# Patient Record
Sex: Female | Born: 1945 | Race: White | Hispanic: No | Smoking: Former smoker
Health system: Southern US, Community
[De-identification: ages and names within clinical notes are randomized; demographics above are authoritative.]

## PROBLEM LIST (undated history)

## (undated) DIAGNOSIS — D126 Benign neoplasm of colon, unspecified: Secondary | ICD-10-CM

## (undated) DIAGNOSIS — C169 Malignant neoplasm of stomach, unspecified: Secondary | ICD-10-CM

## (undated) DIAGNOSIS — J189 Pneumonia, unspecified organism: Secondary | ICD-10-CM

## (undated) DIAGNOSIS — E039 Hypothyroidism, unspecified: Secondary | ICD-10-CM

## (undated) DIAGNOSIS — C801 Malignant (primary) neoplasm, unspecified: Secondary | ICD-10-CM

## (undated) DIAGNOSIS — K648 Other hemorrhoids: Secondary | ICD-10-CM

## (undated) DIAGNOSIS — Z800 Family history of malignant neoplasm of digestive organs: Secondary | ICD-10-CM

## (undated) DIAGNOSIS — C189 Malignant neoplasm of colon, unspecified: Secondary | ICD-10-CM

## (undated) DIAGNOSIS — I499 Cardiac arrhythmia, unspecified: Secondary | ICD-10-CM

## (undated) DIAGNOSIS — F419 Anxiety disorder, unspecified: Secondary | ICD-10-CM

## (undated) DIAGNOSIS — Z803 Family history of malignant neoplasm of breast: Secondary | ICD-10-CM

## (undated) DIAGNOSIS — R1033 Periumbilical pain: Secondary | ICD-10-CM

## (undated) DIAGNOSIS — R109 Unspecified abdominal pain: Secondary | ICD-10-CM

## (undated) DIAGNOSIS — Z808 Family history of malignant neoplasm of other organs or systems: Secondary | ICD-10-CM

## (undated) DIAGNOSIS — D649 Anemia, unspecified: Secondary | ICD-10-CM

## (undated) DIAGNOSIS — Z8669 Personal history of other diseases of the nervous system and sense organs: Secondary | ICD-10-CM

## (undated) HISTORY — DX: Malignant (primary) neoplasm, unspecified: C80.1

## (undated) HISTORY — PX: BREAST SURGERY: SHX581

## (undated) HISTORY — DX: Personal history of other diseases of the nervous system and sense organs: Z86.69

## (undated) HISTORY — DX: Anemia, unspecified: D64.9

## (undated) HISTORY — DX: Benign neoplasm of colon, unspecified: D12.6

## (undated) HISTORY — DX: Other hemorrhoids: K64.8

## (undated) HISTORY — DX: Malignant neoplasm of stomach, unspecified: C16.9

## (undated) HISTORY — DX: Malignant neoplasm of colon, unspecified: C18.9

## (undated) HISTORY — DX: Family history of malignant neoplasm of digestive organs: Z80.0

## (undated) HISTORY — DX: Unspecified abdominal pain: R10.9

## (undated) HISTORY — PX: COLONOSCOPY: SHX174

## (undated) HISTORY — DX: Family history of malignant neoplasm of other organs or systems: Z80.8

## (undated) HISTORY — DX: Periumbilical pain: R10.33

## (undated) HISTORY — PX: BREAST BIOPSY: SHX20

## (undated) HISTORY — DX: Hypothyroidism, unspecified: E03.9

## (undated) HISTORY — DX: Anxiety disorder, unspecified: F41.9

## (undated) HISTORY — PX: TONSILLECTOMY: SUR1361

## (undated) HISTORY — DX: Family history of malignant neoplasm of breast: Z80.3

---

## 1998-01-19 ENCOUNTER — Other Ambulatory Visit: Admission: RE | Admit: 1998-01-19 | Discharge: 1998-01-19 | Payer: Self-pay | Admitting: *Deleted

## 1998-08-24 ENCOUNTER — Other Ambulatory Visit: Admission: RE | Admit: 1998-08-24 | Discharge: 1998-08-24 | Payer: Self-pay | Admitting: *Deleted

## 1999-02-15 ENCOUNTER — Other Ambulatory Visit: Admission: RE | Admit: 1999-02-15 | Discharge: 1999-02-15 | Payer: Self-pay | Admitting: Obstetrics and Gynecology

## 1999-07-22 ENCOUNTER — Encounter: Payer: Self-pay | Admitting: *Deleted

## 1999-07-22 ENCOUNTER — Encounter: Admission: RE | Admit: 1999-07-22 | Discharge: 1999-07-22 | Payer: Self-pay | Admitting: *Deleted

## 1999-08-24 ENCOUNTER — Other Ambulatory Visit: Admission: RE | Admit: 1999-08-24 | Discharge: 1999-08-24 | Payer: Self-pay | Admitting: *Deleted

## 2000-02-16 ENCOUNTER — Other Ambulatory Visit: Admission: RE | Admit: 2000-02-16 | Discharge: 2000-02-16 | Payer: Self-pay | Admitting: *Deleted

## 2000-08-13 ENCOUNTER — Encounter (INDEPENDENT_AMBULATORY_CARE_PROVIDER_SITE_OTHER): Payer: Self-pay | Admitting: Specialist

## 2000-08-13 ENCOUNTER — Ambulatory Visit (HOSPITAL_COMMUNITY): Admission: RE | Admit: 2000-08-13 | Discharge: 2000-08-13 | Payer: Self-pay | Admitting: Gastroenterology

## 2001-02-18 ENCOUNTER — Other Ambulatory Visit: Admission: RE | Admit: 2001-02-18 | Discharge: 2001-02-18 | Payer: Self-pay | Admitting: *Deleted

## 2001-04-02 ENCOUNTER — Emergency Department (HOSPITAL_COMMUNITY): Admission: EM | Admit: 2001-04-02 | Discharge: 2001-04-02 | Payer: Self-pay | Admitting: Emergency Medicine

## 2001-10-29 ENCOUNTER — Encounter: Admission: RE | Admit: 2001-10-29 | Discharge: 2001-10-29 | Payer: Self-pay | Admitting: Obstetrics and Gynecology

## 2001-10-29 ENCOUNTER — Encounter: Payer: Self-pay | Admitting: Obstetrics and Gynecology

## 2001-12-19 ENCOUNTER — Encounter: Payer: Self-pay | Admitting: Obstetrics and Gynecology

## 2001-12-19 ENCOUNTER — Encounter: Admission: RE | Admit: 2001-12-19 | Discharge: 2001-12-19 | Payer: Self-pay | Admitting: Obstetrics and Gynecology

## 2002-02-03 ENCOUNTER — Other Ambulatory Visit: Admission: RE | Admit: 2002-02-03 | Discharge: 2002-02-03 | Payer: Self-pay | Admitting: Obstetrics and Gynecology

## 2002-07-31 ENCOUNTER — Encounter: Admission: RE | Admit: 2002-07-31 | Discharge: 2002-07-31 | Payer: Self-pay | Admitting: Obstetrics and Gynecology

## 2002-07-31 ENCOUNTER — Encounter: Payer: Self-pay | Admitting: Obstetrics and Gynecology

## 2003-04-06 ENCOUNTER — Other Ambulatory Visit: Admission: RE | Admit: 2003-04-06 | Discharge: 2003-04-06 | Payer: Self-pay | Admitting: Obstetrics and Gynecology

## 2003-08-26 ENCOUNTER — Encounter: Admission: RE | Admit: 2003-08-26 | Discharge: 2003-08-26 | Payer: Self-pay | Admitting: Obstetrics and Gynecology

## 2003-08-30 ENCOUNTER — Ambulatory Visit (HOSPITAL_COMMUNITY): Admission: RE | Admit: 2003-08-30 | Discharge: 2003-08-30 | Payer: Self-pay | Admitting: Endocrinology

## 2004-09-09 ENCOUNTER — Encounter: Admission: RE | Admit: 2004-09-09 | Discharge: 2004-09-09 | Payer: Self-pay | Admitting: Obstetrics and Gynecology

## 2005-08-14 ENCOUNTER — Ambulatory Visit (HOSPITAL_COMMUNITY): Admission: RE | Admit: 2005-08-14 | Discharge: 2005-08-14 | Payer: Self-pay | Admitting: Gastroenterology

## 2005-08-14 ENCOUNTER — Encounter (INDEPENDENT_AMBULATORY_CARE_PROVIDER_SITE_OTHER): Payer: Self-pay | Admitting: *Deleted

## 2005-09-19 ENCOUNTER — Encounter: Admission: RE | Admit: 2005-09-19 | Discharge: 2005-09-19 | Payer: Self-pay | Admitting: Obstetrics and Gynecology

## 2006-09-21 ENCOUNTER — Encounter: Admission: RE | Admit: 2006-09-21 | Discharge: 2006-09-21 | Payer: Self-pay | Admitting: Obstetrics and Gynecology

## 2007-05-27 ENCOUNTER — Emergency Department (HOSPITAL_COMMUNITY): Admission: EM | Admit: 2007-05-27 | Discharge: 2007-05-27 | Payer: Self-pay | Admitting: Emergency Medicine

## 2007-10-07 ENCOUNTER — Encounter: Admission: RE | Admit: 2007-10-07 | Discharge: 2007-10-07 | Payer: Self-pay | Admitting: Obstetrics and Gynecology

## 2007-10-09 ENCOUNTER — Encounter: Admission: RE | Admit: 2007-10-09 | Discharge: 2007-10-09 | Payer: Self-pay | Admitting: Obstetrics and Gynecology

## 2008-03-26 ENCOUNTER — Encounter: Admission: RE | Admit: 2008-03-26 | Discharge: 2008-03-26 | Payer: Self-pay | Admitting: Obstetrics and Gynecology

## 2008-07-02 ENCOUNTER — Ambulatory Visit (HOSPITAL_BASED_OUTPATIENT_CLINIC_OR_DEPARTMENT_OTHER): Admission: RE | Admit: 2008-07-02 | Discharge: 2008-07-02 | Payer: Self-pay | Admitting: Surgery

## 2008-07-02 ENCOUNTER — Encounter (INDEPENDENT_AMBULATORY_CARE_PROVIDER_SITE_OTHER): Payer: Self-pay | Admitting: Surgery

## 2008-12-07 ENCOUNTER — Encounter: Admission: RE | Admit: 2008-12-07 | Discharge: 2008-12-07 | Payer: Self-pay | Admitting: Obstetrics and Gynecology

## 2008-12-15 ENCOUNTER — Encounter: Admission: RE | Admit: 2008-12-15 | Discharge: 2008-12-15 | Payer: Self-pay | Admitting: Obstetrics and Gynecology

## 2009-12-28 ENCOUNTER — Encounter: Admission: RE | Admit: 2009-12-28 | Discharge: 2009-12-28 | Payer: Self-pay | Admitting: Obstetrics and Gynecology

## 2010-02-19 ENCOUNTER — Emergency Department (HOSPITAL_COMMUNITY): Admission: EM | Admit: 2010-02-19 | Discharge: 2010-02-19 | Payer: Self-pay | Admitting: Family Medicine

## 2010-07-17 ENCOUNTER — Encounter: Payer: Self-pay | Admitting: Obstetrics and Gynecology

## 2010-07-18 ENCOUNTER — Encounter: Payer: Self-pay | Admitting: Obstetrics and Gynecology

## 2010-11-08 NOTE — Op Note (Signed)
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   POSTOPERATIVE DIAGNOSIS:  Mass, right breast with atypical hyperplasia  on needle biopsy.   OPERATION:  Needle-guided excision, right breast mass.   SURGEON:  Currie Paris, MD   ANESTHESIA:  General.   CLINICAL HISTORY:  This patient had a biopsy with an abnormality seen on  mammography that showed some atypia.  Wide excisional biopsy was  recommended.   DESCRIPTION OF PROCEDURE:  The patient was seen in the holding area, and  she had no further questions.  She was taken to the operating room and  after satisfactory general anesthesia had been obtained, the right  breast was prepped and draped.  The time-out was done.   I had previously reviewed the guidewire localization films and 2  guidewires were placed, one not quite as deep as the lesion and the  second, the tip went down to the clip.  They both were almost in  identical tracts.   I put some local 0.25% plain Marcaine and then made a transverse  incision and divided some of the subcutaneous tissue for a distance  about a centimeter.  I then grasped the tissue around the guidewires and  took an excisional biopsy in a somewhat cylindrical fashion around the  guidewires until I got beyond the tips at which point I was at the edge  of the pectoralis muscle.  The specimen completely surrounded both  guidewires and I was beyond the tips, so I thought that the area was  encompassed within the lumpectomy.  There was no other palpable  abnormality near the edges of the biopsy cavity.   We did a specimen mammogram which showed the clip and the guidewires in  the specimen.   The wound was irrigated and further local placed.  I then closed in  layers with 3-0 Vicryl and 4-0 Monocryl subcuticular plus Dermabond.   The patient tolerated the procedure well, and there were no  complications.      Currie Paris, M.D.  Electronically Signed     CJS/MEDQ  D:  07/02/2008  T:  07/03/2008  Job:  [id number]   cc:   Jeralyn Ruths, MD  Sherian Rein, MD

## 2010-11-11 NOTE — Op Note (Signed)
Leslie Rivera, ROSSA              ACCOUNT NO.:  [id number]   MEDICAL RECORD NO.:  [id number]          PATIENT TYPE:  AMB   LOCATION:  ENDO                         FACILITY:  [facility]   PHYSICIAN:  Anselmo Rod, M.D.  DATE OF BIRTH:  [birth year]   DATE OF PROCEDURE:  08/14/2005  DATE OF DISCHARGE:                                 OPERATIVE REPORT   PROCEDURE:  Colonoscopy with snare polypectomy x10.   ENDOSCOPIST:  Anselmo Rod, M.D.   INSTRUMENT USED:  Olympus videocolonoscope.   INDICATIONS FOR PROCEDURE:  The patient is a 65 year old white female with a  personal history of colonic polyps undergoing a repeat colonoscopy to rule  out polyps.   PREPROCEDURE PREPARATION:  Informed consent was procured from the patient.  The patient was fasted for four hours prior to the procedure and prepped  with OsmoPrep pills the night of and the morning of the procedure.  The  risks and benefits of the procedure, including a 10% missed rate of cancer  and polyps, was discussed with her as well.   PREPROCEDURE PHYSICAL EXAMINATION:  VITAL SIGNS:  Stable.  NECK:  Supple.  CHEST:  Clear to auscultation. S1, S2 regular.  ABDOMEN:  Soft with normal bowel sounds.   DESCRIPTION OF PROCEDURE:  The patient was placed in the left lateral  decubitus position and sedated with 100 mcg of Fentanyl and 10 mg of Versed  in slow incremental doses.  Once the patient was adequately sedated and  maintained on low-flow oxygen and continuous cardiac monitoring, the Olympus  videocolonoscope was advanced from the rectum to the cecum.  The appendiceal  orifice and ileocecal valve were clearly visualized and photographed.  A  small cecal polyp was removed via hot snare from the cecal base.  Another  small sessile polyp was removed from the proximal right colon via hot snare  as well.  There was some bleeding from the base of the polypectomy site, and  therefore, 5 cc of epinephrine were injected into this area  to achieve  hemostasis.  Four other polyps were removed by hot snare from the right  colon as well.  A total of six polyps were snared from the right colon, four  of them were snared from the left colon.  Retroflexion in the rectum  revealed small internal hemorrhoids.  Two small sessile polyps were ablated  by the tip of the scope in the distal right colon as well.  There was no  evidence of diverticulosis.  The patient tolerated the procedure well  without complications.   IMPRESSION:  1.Multiple colonic polyps removed by hot snare.  2.Small polyp snared from proximal right colon, with some bleeding at the  base of the polypectomy site.  5 cc of epinephrine injected to achieve  hemostasis.  3.Small internal hemorrhoids.   RECOMMENDATIONS:  1.Await pathology results.  2.Avoid all nonsteroidals, including aspirin, for the next four weeks.  3.Repeat colonoscopy depending on pathology results.  4.Outpatient followup as need arises in the future.      Anselmo Rod, M.D.  Electronically Signed  JNM/MEDQ  D:  08/14/2005  T:  08/14/2005  Job:  [id number]   cc:   Vangie Bicker, Felicie Morn, M.D.  Fax: [phone]

## 2010-11-11 NOTE — Procedures (Signed)
[facility]  Patient:    Leslie Rivera, Leslie Rivera                     MRN: [id number] Proc. Date: 08/13/00 Adm. Date:  [id number] Attending:  Charna Elizabeth CC:         Nolon Nations, M.D., M.P.H.   Procedure Report  DATE OF BIRTH:  March 22, 1946  REFERRING PHYSICIAN:  Nolon Nations, M.D., M.P.H.  PROCEDURE PERFORMED:  Colonoscopy with snare polypectomy x 1.  ENDOSCOPIST:  Anselmo Rod, M.D.  INSTRUMENT USED:  Olympus video colonoscope.  INDICATIONS FOR PROCEDURE:  Abnormal rectal exam with questionable mucosal ____________ versus a polyps felt on digital examination.  With a family history of colon cancer in a maternal grandmother in a 4 year old white female, rule out colonic polyps, masses, hemorrhoids, etc.  PREPROCEDURE PREPARATION:  Informed consent was procured from the patient. The patient was fasted for eight hours prior to the procedure and prepped with a bottle of magnesium citrate and a gallon of NuLytely the night prior to the procedure.  PREPROCEDURE PHYSICAL:  The patient had stable vital signs.  Neck supple. Chest clear to auscultation.  S1, S2 regular.  No murmur, rub or gallop.  No rales, rhonchi or wheezing.  Abdomen soft with normal abdominal bowel sounds.  DESCRIPTION OF PROCEDURE:  The patient was placed in the left lateral decubitus position and sedated with 100 mg of Demerol and 10 mg of Versed intravenously.  Once the patient was adequately sedated and maintained on low-flow oxygen and continuous cardiac monitoring, the Olympus video colonoscope was advanced from the rectum to the cecum without difficulty. There was no evidence of diverticular disease.  There was a small sessile polyp seen in the midright colon that was removed by snare polypectomy.  The procedure was complete up to the cecum.  The appendicular orifice and ileocecal valve were clearly visualized and photographed.  The rest of the colonic  mucosa appeared healthy.  There were small nonbleeding internal and external hemorrhoids seen on retroflexion and anal inspection.  IMPRESSION: 1. Small polyp snared from midright colon. 2. Small nonbleeding internal and external hemorrhoids, no rectal masses    appreciated.  RECOMMENDATIONS: 1. Await pathology results. 2. Outpatient follow-up in the next two weeks.DD:  08/13/00 TD:  08/13/00 Job: [id number]

## 2010-12-02 ENCOUNTER — Other Ambulatory Visit: Payer: Self-pay | Admitting: Obstetrics & Gynecology

## 2010-12-02 DIAGNOSIS — Z1231 Encounter for screening mammogram for malignant neoplasm of breast: Secondary | ICD-10-CM

## 2010-12-29 ENCOUNTER — Other Ambulatory Visit: Payer: Self-pay | Admitting: Obstetrics & Gynecology

## 2010-12-30 ENCOUNTER — Ambulatory Visit
Admission: RE | Admit: 2010-12-30 | Discharge: 2010-12-30 | Disposition: A | Discharge status: Home / Self Care | Payer: BC Managed Care – PPO | Source: Ambulatory Visit | Attending: Obstetrics & Gynecology | Admitting: Obstetrics & Gynecology

## 2010-12-30 DIAGNOSIS — Z1231 Encounter for screening mammogram for malignant neoplasm of breast: Secondary | ICD-10-CM

## 2011-08-20 ENCOUNTER — Encounter (HOSPITAL_COMMUNITY): Payer: Self-pay | Admitting: Adult Health

## 2011-08-20 ENCOUNTER — Emergency Department (HOSPITAL_COMMUNITY): Payer: BC Managed Care – PPO

## 2011-08-20 ENCOUNTER — Emergency Department (HOSPITAL_COMMUNITY)
Admission: EM | Admit: 2011-08-20 | Discharge: 2011-08-20 | Disposition: A | Discharge status: Home / Self Care | Payer: BC Managed Care – PPO | Attending: Emergency Medicine | Admitting: Emergency Medicine

## 2011-08-20 DIAGNOSIS — W11XXXA Fall on and from ladder, initial encounter: Secondary | ICD-10-CM | POA: Insufficient documentation

## 2011-08-20 DIAGNOSIS — S2249XA Multiple fractures of ribs, unspecified side, initial encounter for closed fracture: Secondary | ICD-10-CM

## 2011-08-20 DIAGNOSIS — S2239XA Fracture of one rib, unspecified side, initial encounter for closed fracture: Secondary | ICD-10-CM | POA: Insufficient documentation

## 2011-08-20 DIAGNOSIS — M549 Dorsalgia, unspecified: Secondary | ICD-10-CM | POA: Insufficient documentation

## 2011-08-20 DIAGNOSIS — R079 Chest pain, unspecified: Secondary | ICD-10-CM | POA: Insufficient documentation

## 2011-08-20 HISTORY — DX: Cardiac arrhythmia, unspecified: I49.9

## 2011-08-20 MED ORDER — LORAZEPAM 2 MG/ML IJ SOLN
1.0000 mg | Freq: Once | INTRAMUSCULAR | Status: AC
  Administered 2011-08-20: 1 mg via INTRAVENOUS
  Filled 2011-08-20: qty 1

## 2011-08-20 MED ORDER — HYDROMORPHONE HCL PF 1 MG/ML IJ SOLN
1.0000 mg | Freq: Once | INTRAMUSCULAR | Status: AC
  Administered 2011-08-20: 1 mg via INTRAVENOUS
  Filled 2011-08-20: qty 1

## 2011-08-20 MED ORDER — DIAZEPAM 5 MG PO TABS: 5.0000 mg | ORAL_TABLET | Freq: Two times a day (BID) | ORAL | Status: AC | PRN

## 2011-08-20 MED ORDER — OXYCODONE-ACETAMINOPHEN 7.5-325 MG PO TABS: 1.0000 | ORAL_TABLET | Freq: Four times a day (QID) | ORAL | Status: AC | PRN

## 2011-08-20 MED ORDER — SODIUM CHLORIDE 0.9 % IV BOLUS (SEPSIS)
500.0000 mL | Freq: Once | INTRAVENOUS | Status: AC
  Administered 2011-08-20: 500 mL via INTRAVENOUS

## 2011-08-20 NOTE — ED Provider Notes (Signed)
History     CSN: [id number]  Arrival date & time 08/20/11  1147   First MD Initiated Contact with Patient 08/20/11 1150      Chief Complaint  Patient presents with  . Back Pain   This is a very pleasant, 66 year old female. 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 Patient apparently fell off a ladder at her place of business on Wednesday, and when she fell from a height of approximately 5 feet. She did land on her back and complains of pain in the left posterior rib area. When she fell she did not lose consciousness. She had no neck pain. No abdominal pain. No anterior chest pain. She was seen by her primary care physician. Both Wednesday, and Friday. She was initially prescribed Robaxin and then was prescribed oxycodone on Friday. She states that she is feeling better, but continues to have "spasms" in her posterior rib area. She has had no fever. No cough. No difficulty breathing. Her family states that she is otherwise a very healthy person.  No head injury.  (Consider location/radiation/quality/duration/timing/severity/associated sxs/prior treatment) HPI  Past Medical History  Diagnosis Date  . Irregular heart beat     No past surgical history on file.  No family history on file.  History  Substance Use Topics  . Smoking status: Never Smoker   . Smokeless tobacco: Not on file  . Alcohol Use: No    OB History    Grav Para Term Preterm Abortions TAB SAB Ect Mult Living                  Review of Systems  All other systems reviewed and are negative.    Allergies  Review of patient's allergies indicates no known allergies.  Home Medications  No current outpatient prescriptions on file.  BP 125/74  Pulse 50  Temp 97.6 F (36.4 C)  Resp 16  SpO2 96%  Physical Exam  Nursing note and vitals reviewed. Constitutional: She is oriented to person, place, and time. She appears well-developed and well-nourished.  HENT:  Head: Normocephalic and atraumatic.  Eyes:  Conjunctivae and EOM are normal. Pupils are equal, round, and reactive to light.  Neck: Neck supple.  Cardiovascular: Normal rate and regular rhythm.  Exam reveals no gallop and no friction rub.   No murmur heard. Pulmonary/Chest: Effort normal and breath sounds normal. No respiratory distress. She has no wheezes. She has no rales. She exhibits no tenderness.       Diffuse tenderness L post rib area.  No ecchymosis,  No swelling or redness, no crepitance.  No bony deformity.   Abdominal: Soft. Bowel sounds are normal. She exhibits no distension. There is no tenderness. There is no rebound and no guarding.  Musculoskeletal: Normal range of motion.       Findings of the spine is examined, there is no spinal tenderness. No swelling no redness no ecchymoses  Neurological: She is alert and oriented to person, place, and time. No cranial nerve deficit. Coordination normal.  Skin: Skin is warm and dry. No rash noted.  Psychiatric: She has a normal mood and affect.    ED Course  Procedures (including critical care time)  Labs Reviewed - No data to display No results found.   No diagnosis found.    MDM  Pt is seen and examined;  Initial history and physical completed.  Will follow.      Results for orders placed during the hospital encounter of 07/02/08  POCT HEMOGLOBIN-HEMACUE      Component Value Range   Hemoglobin 13.5  12.0 - 15.0 (g/dL)   Ct Chest Wo Contrast  08/20/2011  *RADIOLOGY REPORT*  Clinical Data: Fall  CT CHEST WITHOUT CONTRAST  Technique:  Multidetector CT imaging of the chest was performed following the standard protocol without IV contrast.  Comparison: None.  Findings: No obvious evidence of mediastinal hemorrhage.  Lack of intravenous contrast severely limits examination of the mediastinum.  Aortic injury cannot be excluded.  No pneumothorax.  Calcified granulomata in the right upper lobe.  Linear atelectasis at both lung bases  Small left pleural effusion.  Acute  minimally displaced fractures of the posterolateral left seventh and eighth ribs is present.   Thoracic spine and sternum are intact.  IMPRESSION: Acute minimally displaced left seventh and eighth rib fractures.  Small left pleural effusion.  Intact thoracic spine.  Original Report Authenticated By: Donavan Burnet, M.D.     CT scan is reviewed with patient showing minimally displaced left seventh and eighth rib fractures and a small pleural effusion. The thoracic spine was intact.   Patient was feeling much better from IV pain medications and is given one more shot of Dilaudid prior to disposition   She states her current pain medications are actually not working, so will be prescribed Percocet 7.5 mg. She is also given a dose of Valium for her severe muscle spasms.   Patient was given an incentive spirometer with instructions to use and to use with specific instructions including use, at least 5-10 times per hour. She is also instructed to see her primary physician within the next 48 hours for reassessment. She was told to return to the ED for any concerns such as worsening pain, difficulty breathing, dizziness, syncope, etc.   Otherwise, patient is stable for discharge, and wants to go home. Discharged home in stable and improved condition     Dillan Candela A. Patrica Duel, MD 08/20/11 1610

## 2011-08-20 NOTE — ED Notes (Signed)
Three days ago fell off 5 foot ladder, given muscle relaxer and pain medication by Dr. Ricki Miller, no imaging done. Today pt has muscle spasms, unable to sit or lay.  C/o 10/10 muscle spasms in upper left of back.

## 2011-08-20 NOTE — Discharge Instructions (Signed)
Rib Fracture The ribs are like a cage that goes around your upper chest. The ribs protect your lungs. Your ribs move when you breathe, so it hurts if a rib is broken. HOME CARE  Avoid activities that cause pain to the injured area. Protect your injured area.   Eat a normal, healthy diet.   Drink enough fluids to keep your pee (urine) clear or pale yellow.   Take deep breaths many times a day. Cough many times a day while hugging a pillow.   Do not wear a rib belt or binder on the chest unless told by your doctor. Rib belts or binders do not allow you to breathe deeply.   Only take medicine as told by your doctor.  GET HELP RIGHT AWAY IF:   You have a fever.   You cannot stop coughing or cough up thick or bloody spit (mucus).   You have trouble breathing.   You feel sick to your stomach (nauseous), throw up (vomit), or have belly (abdominal) pain.   Your pain gets worse and medicine does not help.  MAKE SURE YOU:   Understand these instructions.   Will watch this condition.   Will get help right away if you are not doing well or get worse.  Document Released: 03/21/2008 Document Revised: 02/22/2011 Document Reviewed: 03/21/2008 [facility] Patient Information 2012 [place], [place].   Incentive Spirometer An incentive spirometer is a tool that can help keep your lungs clear and active. This tool measures how well you are filling your lungs with each breath. Taking long deep breaths may help reverse or decrease the chance of developing breathing (pulmonary) problems (especially infection) following:  Surgery of the chest or abdomen.   Surgery if you have a history of smoking or a lung problem.   A long period of time when you are unable to move or be active.  BEFORE THE PROCEDURE   If the spirometer includes an indictor to show your best effort, your nurse or respiratory therapist will set it to a desired goal.   If possible, sit up straight or lean slightly forward. Try not  to slouch.   Hold the incentive spirometer in an upright position.  INSTRUCTIONS FOR USE  1. Sit on the edge of your bed if possible, or sit up as far as you can in bed or on a chair.  2. Hold the incentive spirometer in an upright position.  3. Breathe out normally.  4. Place the mouthpiece in your mouth and seal your lips tightly around it.  5. Breathe in slowly and as deeply as possible, raising the piston or the ball toward the top of the column.  6. Hold your breath for 3-5 seconds or for as long as possible. Allow the piston or ball to fall to the bottom of the column.  7. Remove the mouthpiece from your mouth and breathe out normally.  8. Rest for a few seconds and repeat Steps 1 through 7 at least 10 times every 1-2 hours when you are awake. Take your time and take a few normal breaths between deep breaths.  9. The spirometer may include an indicator to show your best effort. Use the indicator as a goal to work toward during each repetition.  10. After each set of 10 deep breaths, practice coughing to be sure your lungs are clear. If you have an incision (the cut made at the time of surgery), support your incision when coughing by placing a pillow or rolled  up towels firmly against it.  Once you are able to get out of bed, walk around indoors and cough well. You may stop using the incentive spirometer when instructed by your caregiver.  RISKS AND COMPLICATIONS  Breathing too quickly may cause dizziness. At an extreme, this could cause you to pass out. Take your time so you do not get dizzy or light-headed.   If you are in pain, you may need to take or ask for pain medication before doing incentive spirometry. It is harder to take a deep breath if you are having pain.  AFTER USE  Rest and breathe slowly and easily.   It can be helpful to keep track of a log of your progress. Your caregiver can provide you with a simple table to help with this.  If you are using the spirometer at home,  follow these instructions: SEEK MEDICAL CARE IF:   You are having difficultly using the spirometer.   You have trouble using the spirometer as often as instructed.   Your pain medication is not giving enough relief while using the spirometer.   You develop fever of 100.5 F (38.1 C) or higher.  SEEK IMMEDIATE MEDICAL CARE IF:   You cough up bloody sputum that had not been present before.   You develop fever of 102 F (38.9 C) or greater.   You develop worsening pain at or near the incision site.  MAKE SURE YOU:   Understand these instructions.   Will watch your condition.   Will get help right away if you are not doing well or get worse.  Document Released: 10/23/2006 Document Revised: 02/22/2011 Document Reviewed: 12/24/2006 [facility] Patient Information 2012 [place], [place].

## 2011-08-20 NOTE — ED Notes (Signed)
Patient given discharge instructions, information, prescriptions, and diet order. Patient states that they adequately understand discharge information given and to return to ED if symptoms return or worsen.    Pt leaving with sister to home.

## 2012-02-08 ENCOUNTER — Other Ambulatory Visit: Payer: Self-pay | Admitting: Obstetrics & Gynecology

## 2012-02-08 DIAGNOSIS — Z1231 Encounter for screening mammogram for malignant neoplasm of breast: Secondary | ICD-10-CM

## 2012-02-16 ENCOUNTER — Ambulatory Visit: Payer: BC Managed Care – PPO

## 2012-02-19 ENCOUNTER — Ambulatory Visit
Admission: RE | Admit: 2012-02-19 | Discharge: 2012-02-19 | Disposition: A | Discharge status: Home / Self Care | Payer: BC Managed Care – PPO | Source: Ambulatory Visit | Attending: Obstetrics & Gynecology | Admitting: Obstetrics & Gynecology

## 2012-02-19 DIAGNOSIS — Z1231 Encounter for screening mammogram for malignant neoplasm of breast: Secondary | ICD-10-CM

## 2012-02-20 ENCOUNTER — Ambulatory Visit: Payer: BC Managed Care – PPO

## 2013-05-05 ENCOUNTER — Other Ambulatory Visit: Payer: Self-pay

## 2013-05-05 DIAGNOSIS — Z1231 Encounter for screening mammogram for malignant neoplasm of breast: Secondary | ICD-10-CM

## 2013-06-03 ENCOUNTER — Other Ambulatory Visit: Payer: Self-pay | Admitting: Gastroenterology

## 2013-06-03 DIAGNOSIS — C182 Malignant neoplasm of ascending colon: Secondary | ICD-10-CM

## 2013-06-04 ENCOUNTER — Ambulatory Visit
Admission: RE | Admit: 2013-06-04 | Discharge: 2013-06-04 | Disposition: A | Discharge status: Home / Self Care | Payer: Medicare Other | Source: Ambulatory Visit | Attending: Gastroenterology | Admitting: Gastroenterology

## 2013-06-04 DIAGNOSIS — C182 Malignant neoplasm of ascending colon: Secondary | ICD-10-CM

## 2013-06-04 MED ORDER — IOHEXOL 300 MG/ML  SOLN
100.0000 mL | Freq: Once | INTRAMUSCULAR | Status: AC | PRN
  Administered 2013-06-04: 100 mL via INTRAVENOUS

## 2013-06-06 ENCOUNTER — Encounter (INDEPENDENT_AMBULATORY_CARE_PROVIDER_SITE_OTHER): Payer: Self-pay

## 2013-06-09 ENCOUNTER — Ambulatory Visit: Payer: BC Managed Care – PPO

## 2013-06-11 ENCOUNTER — Encounter (INDEPENDENT_AMBULATORY_CARE_PROVIDER_SITE_OTHER): Payer: Self-pay | Admitting: General Surgery

## 2013-06-11 ENCOUNTER — Ambulatory Visit (INDEPENDENT_AMBULATORY_CARE_PROVIDER_SITE_OTHER): Payer: Medicare Other | Admitting: General Surgery

## 2013-06-11 VITALS — BP 118/64 | HR 68 | Temp 98.6°F | Resp 14 | Ht 66.5 in | Wt 143.4 lb

## 2013-06-11 DIAGNOSIS — Z85038 Personal history of other malignant neoplasm of large intestine: Secondary | ICD-10-CM | POA: Insufficient documentation

## 2013-06-11 DIAGNOSIS — C182 Malignant neoplasm of ascending colon: Secondary | ICD-10-CM

## 2013-06-11 NOTE — Progress Notes (Addendum)
Patient ID: Leslie Rivera, female   DOB: 11-Jan-1946, 67 y.o.   MRN: [id number]  Chief Complaint  Patient presents with  . New Evaluation    eval rt colon mass    HPI Leslie Rivera is a 67 y.o. female.   HPI  She is referred by Dr. Loreta Rivera for further evaluation and treatment of a newly diagnosed ascending colon cancer.  She had been feeling weak and tired. She had her annual visit with her primary care physician and was found to be anemic. Her hemoglobin was 9.6. She underwent upper and lower endoscopy. Colonoscopy demonstrated a lesion in the ascending colon near the ileocecal valve. 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 For this reason I've been asked to see her. She has been started on iron for her anemia. Her maternal grandmother had colon cancer.  She denies any changes in her bowel habits.  No obvious evidence of metastatic disease noted on CT scan.  She is here with her sister.  Past Medical History  Diagnosis Date  . Irregular heart beat   . Hypothyroidism   . Anxiety   . History of migraine headaches   . Adenomatous colon polyp   . Internal hemorrhoids   . Anemia     Iron deficiency anemia  . Abdominal pain   . Periumbilical pain   . Cancer     Past Surgical History  Procedure Laterality Date  . Breast surgery      removal abnormal cells in right    Family History  Problem Relation Age of Onset  . Cancer Brother     internal melanoma  . Cancer Maternal Grandmother     colon    Social History History  Substance Use Topics  . Smoking status: Former Smoker    Quit date: 06/11/1997  . Smokeless tobacco: Never Used  . Alcohol Use: Yes    Allergies  Allergen Reactions  . Erythromycin     Current Outpatient Prescriptions  Medication Sig Dispense Refill  . calcium carbonate (OS-CAL) 600 MG TABS tablet Take 600 mg by mouth 2 (two) times daily with a meal.      . diazepam (VALIUM) 5 MG tablet       . levothyroxine  (SYNTHROID, LEVOTHROID) 50 MCG tablet Take 50-75 mcg by mouth daily. 1.5 tablets for 2 days then 1 tablet and repeats cycle.      . Omega-3 Fatty Acids (FISH OIL) 1000 MG CAPS Take by mouth.      Marland Kitchen OVER THE COUNTER MEDICATION       . Probiotic Product (PROBIOTIC DAILY PO) Take by mouth.      . zolpidem (AMBIEN) 10 MG tablet        No current facility-administered medications for this visit.    Review of Systems Review of Systems  Constitutional: Negative.   HENT: Negative.   Respiratory: Negative.   Cardiovascular: Negative.   Gastrointestinal: Positive for diarrhea. Negative for abdominal pain.  Genitourinary: Negative.   Musculoskeletal: Negative.   Neurological: Negative.   Hematological: Negative.     Blood pressure 118/64, pulse 68, temperature 98.6 F (37 C), temperature source Temporal, resp. rate 14, height 5' 6.5" (1.689 m), weight 143 lb 6.4 oz (65.046 kg).  Physical Exam Physical Exam  Constitutional: She appears well-developed and well-nourished. No distress.  HENT:  Head: Normocephalic and atraumatic.  Eyes: EOM are normal. No scleral icterus.  Neck: Neck supple.  Cardiovascular: Normal rate and regular rhythm.  Pulmonary/Chest: Effort normal and breath sounds normal.  Abdominal: Soft. She exhibits no distension and no mass. There is no tenderness.  Musculoskeletal: She exhibits no edema.  Lymphadenopathy:    She has no cervical adenopathy.  Neurological: She is alert.  Skin: Skin is warm and dry.  Psychiatric: She has a normal mood and affect. Her behavior is normal.    Data Reviewed Colonoscopy report. Pathology report. Note from Dr. Loreta Rivera. CT scan.  Assessment    Newly diagnosed descending colon cancer. She is otherwise healthy.     Plan    Laparoscopic-assisted partial colectomy.  I have explained the procedure and risks of colon resection.  Risks include but are not limited to bleeding, infection, wound problems, anesthesia, anastomotic leak,  need for colostomy, injury to intraabominal organs (such as intestine, spleen, kidney, bladder, ureter, etc.), ileus, irregular bowel habits.  She seems to understand and agrees to proceed.        Leslie Rivera J 06/11/2013, 12:29 PM

## 2013-06-11 NOTE — Patient Instructions (Signed)
[facility]  ONE-DAY (1) PRE-OP HOME COLON PREP INSTRUCTIONS: ** MIRALAX / GATORADE PREP **  Fill the two prescriptions at a pharmacy of your choice.  You must follow the instructions below carefully.  If you have questions or problems, please call and speak to someone in the clinic department at our office:   [phone].  MIRALAX - GATORADE -- DULCOLAX TABS:   Fill the prescriptions for MIRALAX  (255 gm bottle)    In addition, purchase four (4) DULCOLAX TABLETS (no prescription required), and one 64 oz GATORADE.  (Do NOT purchase red Gatorade; any other flavor is acceptable).  ANITIBIOTICS:   There will be 2 different antibiotics.     Take both prescriptions THE AFTERNOON BEFORE your surgery, at the times written on the bottles.  INSTRUCTIONS: 1. Five days prior to your procedure do not eat nuts, popcorn, or fruit with seeds.  Stop all fiber supplements such as Metamucil, Citrucel, etc.  2. The day before your procedure: o 6:00am:  take (4) Dulcolax tablets.  You should remain on clear liquids for the entire day.   CLEAR LIQUIDS: clear bouillon, broth, jello (NOT RED), black coffee, tea, soda, etc o 10:00am:  add the bottle of MiraLax to the 64-oz bottle of Gatorade, and dissolve.  Begin drinking the Gatorade mixture until gone (8 oz every 15-30 minutes).  Continue clear liquids until midnight (or bedtime). o Take the antibiotics at the times instructed on the bottles.  3. The day of your procedure:   Do not eat or drink ANYTHING after midnight before your surgery.     If you take Heart or Blood Pressure medicine, ask the pre-op nurses about these during your preop appointment.   Further pre-operative instructions will be given to you from the hospital.   Expect to be contacted 5-7 days before your surgery.       

## 2013-06-13 ENCOUNTER — Encounter (INDEPENDENT_AMBULATORY_CARE_PROVIDER_SITE_OTHER): Payer: Self-pay

## 2013-06-30 ENCOUNTER — Telehealth (INDEPENDENT_AMBULATORY_CARE_PROVIDER_SITE_OTHER): Payer: Self-pay | Admitting: General Surgery

## 2013-06-30 NOTE — Telephone Encounter (Signed)
Have her take the Neomycin only.

## 2013-06-30 NOTE — Telephone Encounter (Signed)
Pt called to get different antibiotic for pre-op.  She is allergic to Erythromycin and that is what was ordered.  Please send new order to [facility] [address].

## 2013-06-30 NOTE — Telephone Encounter (Signed)
Pt is now concerned that any mycin drug will make her sick.  Please advise.

## 2013-07-02 NOTE — Progress Notes (Signed)
Dr. Zella Richer - Please enter preop orders in Epic for Sapphira Steveson.  Thanks.

## 2013-07-03 ENCOUNTER — Other Ambulatory Visit (INDEPENDENT_AMBULATORY_CARE_PROVIDER_SITE_OTHER): Payer: Self-pay | Admitting: General Surgery

## 2013-07-03 ENCOUNTER — Ambulatory Visit (INDEPENDENT_AMBULATORY_CARE_PROVIDER_SITE_OTHER): Payer: Medicare Other | Admitting: General Surgery

## 2013-07-07 ENCOUNTER — Encounter (INDEPENDENT_AMBULATORY_CARE_PROVIDER_SITE_OTHER): Payer: Self-pay

## 2013-07-07 ENCOUNTER — Encounter (HOSPITAL_COMMUNITY): Payer: Self-pay | Admitting: Pharmacy Technician

## 2013-07-08 ENCOUNTER — Encounter (INDEPENDENT_AMBULATORY_CARE_PROVIDER_SITE_OTHER): Payer: Self-pay

## 2013-07-10 ENCOUNTER — Other Ambulatory Visit: Payer: Self-pay

## 2013-07-10 ENCOUNTER — Other Ambulatory Visit (INDEPENDENT_AMBULATORY_CARE_PROVIDER_SITE_OTHER): Payer: Self-pay | Admitting: General Surgery

## 2013-07-10 ENCOUNTER — Encounter (HOSPITAL_COMMUNITY)
Admission: RE | Admit: 2013-07-10 | Discharge: 2013-07-10 | Disposition: A | Discharge status: Home / Self Care | Payer: Medicare Other | Source: Ambulatory Visit | Attending: General Surgery | Admitting: General Surgery

## 2013-07-10 ENCOUNTER — Telehealth (INDEPENDENT_AMBULATORY_CARE_PROVIDER_SITE_OTHER): Payer: Self-pay | Admitting: General Surgery

## 2013-07-10 ENCOUNTER — Encounter (HOSPITAL_COMMUNITY): Payer: Self-pay

## 2013-07-10 DIAGNOSIS — Z01812 Encounter for preprocedural laboratory examination: Secondary | ICD-10-CM | POA: Insufficient documentation

## 2013-07-10 DIAGNOSIS — Z01818 Encounter for other preprocedural examination: Secondary | ICD-10-CM | POA: Insufficient documentation

## 2013-07-10 DIAGNOSIS — Z01810 Encounter for preprocedural cardiovascular examination: Secondary | ICD-10-CM | POA: Insufficient documentation

## 2013-07-10 LAB — CBC WITH DIFFERENTIAL/PLATELET
BASOS ABS: 0 10*3/uL (ref 0.0–0.1)
BASOS PCT: 1 % (ref 0–1)
EOS ABS: 0.1 10*3/uL (ref 0.0–0.7)
EOS PCT: 1 % (ref 0–5)
HEMATOCRIT: 36.4 % (ref 36.0–46.0)
Hemoglobin: 11 g/dL — ABNORMAL LOW (ref 12.0–15.0)
Lymphocytes Relative: 37 % (ref 12–46)
Lymphs Abs: 1.7 10*3/uL (ref 0.7–4.0)
MCH: 23.6 pg — AB (ref 26.0–34.0)
MCHC: 30.2 g/dL (ref 30.0–36.0)
MCV: 77.9 fL — AB (ref 78.0–100.0)
MONO ABS: 0.3 10*3/uL (ref 0.1–1.0)
Monocytes Relative: 7 % (ref 3–12)
Neutro Abs: 2.4 10*3/uL (ref 1.7–7.7)
Neutrophils Relative %: 53 % (ref 43–77)
Platelets: 380 10*3/uL (ref 150–400)
RBC: 4.67 MIL/uL (ref 3.87–5.11)
RDW: 20.5 % — AB (ref 11.5–15.5)
WBC: 4.4 10*3/uL (ref 4.0–10.5)

## 2013-07-10 LAB — COMPREHENSIVE METABOLIC PANEL
ALBUMIN: 3.7 g/dL (ref 3.5–5.2)
ALT: 11 U/L (ref 0–35)
AST: 21 U/L (ref 0–37)
Alkaline Phosphatase: 81 U/L (ref 39–117)
BUN: 14 mg/dL (ref 6–23)
CALCIUM: 9.8 mg/dL (ref 8.4–10.5)
CO2: 23 mEq/L (ref 19–32)
CREATININE: 0.67 mg/dL (ref 0.50–1.10)
Chloride: 101 mEq/L (ref 96–112)
GFR calc Af Amer: >90 mL/min (ref >90)
GFR calc non Af Amer: >90 mL/min (ref >90)
Glucose, Bld: 95 mg/dL (ref 70–99)
Potassium: 5.3 mEq/L (ref 3.7–5.3)
Sodium: 136 mEq/L — ABNORMAL LOW (ref 137–147)
Total Bilirubin: 0.4 mg/dL (ref 0.3–1.2)
Total Protein: 7.3 g/dL (ref 6.0–8.3)

## 2013-07-10 LAB — PROTIME-INR
INR: 0.97 (ref 0.00–1.49)
PROTHROMBIN TIME: 12.7 s (ref 11.6–15.2)

## 2013-07-10 LAB — ABO/RH: ABO/RH(D): O NEG

## 2013-07-10 MED ORDER — ONDANSETRON HCL 4 MG PO TABS: 4.0000 mg | ORAL_TABLET | ORAL | Status: DC

## 2013-07-10 NOTE — Pre-Procedure Instructions (Signed)
07-10-13 EKG done today . CXR done 05-15-13 Epic.

## 2013-07-10 NOTE — Patient Instructions (Signed)
[place]  07/10/2013   Your procedure is scheduled on: 1-22  -2015  Report to [facility] at     0900    AM.  Call this number if you have problems the morning of surgery: [phone]  Or Presurgical Testing [phone](Heavenleigh Petruzzi) For Living Will and/or Health Care Power Attorney Forms: please provide copy for your medical record,may bring AM of surgery(Forms should be already notarized -we do not provide this service).  Remember: Follow any bowel prep instructions per MD office.    Do not eat food:After Midnight.(May continue Clear Liquids until 0500 AM 07-17-13)   Clear liquids include soda, tea, black coffee, apple or grape juice, broth.  Take these medicines the morning of surgery with A SIP OF WATER: Levothyroxine. Valium(as needed).   Do not wear jewelry, make-up or nail polish.  Do not wear lotions, powders, or perfumes. You may wear deodorant.  Do not shave 12 hours prior to first CHG shower(legs and under arms).(face and neck okay.)  Do not bring valuables to the hospital.(Hospital is not responsible for lost valuables).  Contacts, dentures or removable bridgework, body piercing, hair pins may not be worn into surgery.  Leave suitcase in the car. After surgery it may be brought to your room.  For patients admitted to the hospital, checkout time is 11:00 AM the day of discharge.(Restricted visitors-Persons, age 86 or younger - may not visit at this time.)    Patients discharged the day of surgery will not be allowed to drive home. Must have responsible person with you x 24 hours once discharged.  Name and phone number of your driver: Dyke Brackett [phone] cell  Special Instructions: CHG(Chlorhedine 4%-"Hibiclens","Betasept","Aplicare") Shower Use Special Wash: see special instructions.(avoid face and genitals)   Please read over the following fact sheets that you were given:  Blood Transfusion fact sheet, Incentive Spirometry  Instruction.  Remember : Type/Screen "Blue armbands" - may not be removed once applied(would result in being retested AM of surgery, if removed).  Failure to follow these instructions may result in Cancellation of your surgery.   Patient signature_______________________________________________________

## 2013-07-10 NOTE — Telephone Encounter (Signed)
Ordered Zofran via e-mail to pharmacy and contacted pt with plan, as outlined by Dr. Zella Richer.  She will comply.

## 2013-07-10 NOTE — Telephone Encounter (Signed)
Please call her in Zofran 4 mg p.o. q4 prn nausea #10.  Have her take one with the antibiotics we gave her the prescription for.  Those specific antibiotics help decrease the rate of infection from the surgery.  If she tries once dose and does not tolerate them, she can stop taking them.

## 2013-07-10 NOTE — Telephone Encounter (Signed)
Pt called to check on her pre-op medications.  She had called previously to report she cannot tolerate the mycins, as she becomes very nauseated and has vomiting.  Calling to see if she is going to get IV antibiotics the DOS or does she need to take a non-mycin oral antibiotic beforehand.  Please advise.

## 2013-07-15 ENCOUNTER — Ambulatory Visit: Payer: BC Managed Care – PPO

## 2013-07-17 ENCOUNTER — Encounter (HOSPITAL_COMMUNITY)
Admission: RE | Disposition: A | Discharge status: Home / Self Care | Payer: Self-pay | Source: Ambulatory Visit | Attending: General Surgery

## 2013-07-17 ENCOUNTER — Inpatient Hospital Stay (HOSPITAL_COMMUNITY): Payer: Medicare Other | Admitting: Registered Nurse

## 2013-07-17 ENCOUNTER — Inpatient Hospital Stay (HOSPITAL_COMMUNITY)
Admission: RE | Admit: 2013-07-17 | Discharge: 2013-07-21 | DRG: 330 | Disposition: A | Discharge status: Home / Self Care | Payer: Medicare Other | Source: Ambulatory Visit | Attending: General Surgery | Admitting: General Surgery

## 2013-07-17 ENCOUNTER — Encounter (HOSPITAL_COMMUNITY): Payer: Self-pay | Admitting: *Deleted

## 2013-07-17 ENCOUNTER — Encounter (HOSPITAL_COMMUNITY): Payer: Medicare Other | Admitting: Registered Nurse

## 2013-07-17 DIAGNOSIS — Z880 Allergy status to penicillin: Secondary | ICD-10-CM

## 2013-07-17 DIAGNOSIS — Z885 Allergy status to narcotic agent status: Secondary | ICD-10-CM

## 2013-07-17 DIAGNOSIS — Z808 Family history of malignant neoplasm of other organs or systems: Secondary | ICD-10-CM

## 2013-07-17 DIAGNOSIS — Z79899 Other long term (current) drug therapy: Secondary | ICD-10-CM

## 2013-07-17 DIAGNOSIS — F411 Generalized anxiety disorder: Secondary | ICD-10-CM | POA: Diagnosis present

## 2013-07-17 DIAGNOSIS — D62 Acute posthemorrhagic anemia: Secondary | ICD-10-CM | POA: Diagnosis present

## 2013-07-17 DIAGNOSIS — Z87891 Personal history of nicotine dependence: Secondary | ICD-10-CM

## 2013-07-17 DIAGNOSIS — Z800 Family history of malignant neoplasm of digestive organs: Secondary | ICD-10-CM

## 2013-07-17 DIAGNOSIS — K7689 Other specified diseases of liver: Secondary | ICD-10-CM | POA: Diagnosis present

## 2013-07-17 DIAGNOSIS — Z01810 Encounter for preprocedural cardiovascular examination: Secondary | ICD-10-CM

## 2013-07-17 DIAGNOSIS — C182 Malignant neoplasm of ascending colon: Principal | ICD-10-CM | POA: Diagnosis present

## 2013-07-17 DIAGNOSIS — Z01812 Encounter for preprocedural laboratory examination: Secondary | ICD-10-CM

## 2013-07-17 DIAGNOSIS — Z5331 Laparoscopic surgical procedure converted to open procedure: Secondary | ICD-10-CM

## 2013-07-17 DIAGNOSIS — Z860100 Personal history of colon polyps, unspecified: Secondary | ICD-10-CM

## 2013-07-17 DIAGNOSIS — C189 Malignant neoplasm of colon, unspecified: Secondary | ICD-10-CM | POA: Diagnosis present

## 2013-07-17 DIAGNOSIS — Z85038 Personal history of other malignant neoplasm of large intestine: Secondary | ICD-10-CM | POA: Diagnosis present

## 2013-07-17 DIAGNOSIS — D500 Iron deficiency anemia secondary to blood loss (chronic): Secondary | ICD-10-CM | POA: Diagnosis present

## 2013-07-17 DIAGNOSIS — E039 Hypothyroidism, unspecified: Secondary | ICD-10-CM | POA: Diagnosis present

## 2013-07-17 HISTORY — DX: Malignant neoplasm of colon, unspecified: C18.9

## 2013-07-17 HISTORY — PX: LAPAROSCOPIC RIGHT COLECTOMY: SHX5925

## 2013-07-17 LAB — TYPE AND SCREEN
ABO/RH(D): O NEG
Antibody Screen: NEGATIVE

## 2013-07-17 SURGERY — COLECTOMY, RIGHT, LAPAROSCOPIC
Anesthesia: General | Site: Abdomen | Laterality: Right

## 2013-07-17 MED ORDER — ONDANSETRON HCL 4 MG/2ML IJ SOLN
INTRAMUSCULAR | Status: AC
  Filled 2013-07-17: qty 2

## 2013-07-17 MED ORDER — DEXTROSE 5 % IV SOLN
INTRAVENOUS | Status: AC
  Filled 2013-07-17 (×2): qty 1

## 2013-07-17 MED ORDER — DIPHENHYDRAMINE HCL 50 MG/ML IJ SOLN: 12.5000 mg | Freq: Four times a day (QID) | INTRAMUSCULAR | Status: DC | PRN

## 2013-07-17 MED ORDER — KETOROLAC TROMETHAMINE 30 MG/ML IJ SOLN: 15.0000 mg | Freq: Once | INTRAMUSCULAR | Status: DC | PRN

## 2013-07-17 MED ORDER — PROMETHAZINE HCL 25 MG/ML IJ SOLN: 6.2500 mg | INTRAMUSCULAR | Status: DC | PRN

## 2013-07-17 MED ORDER — GLYCOPYRROLATE 0.2 MG/ML IJ SOLN
INTRAMUSCULAR | Status: AC
  Filled 2013-07-17: qty 2

## 2013-07-17 MED ORDER — ONDANSETRON HCL 4 MG PO TABS: 4.0000 mg | ORAL_TABLET | ORAL | Status: DC | PRN

## 2013-07-17 MED ORDER — ACETAMINOPHEN 10 MG/ML IV SOLN
1000.0000 mg | Freq: Once | INTRAVENOUS | Status: AC
  Administered 2013-07-17: 1000 mg via INTRAVENOUS
  Filled 2013-07-17: qty 100

## 2013-07-17 MED ORDER — LEVOTHYROXINE SODIUM 75 MCG PO TABS
75.0000 ug | ORAL_TABLET | ORAL | Status: DC
  Administered 2013-07-18 – 2013-07-21 (×2): 75 ug via ORAL
  Filled 2013-07-17 (×2): qty 1

## 2013-07-17 MED ORDER — ONDANSETRON HCL 4 MG/2ML IJ SOLN: 4.0000 mg | Freq: Four times a day (QID) | INTRAMUSCULAR | Status: DC | PRN

## 2013-07-17 MED ORDER — 0.9 % SODIUM CHLORIDE (POUR BTL) OPTIME
TOPICAL | Status: DC | PRN
  Administered 2013-07-17: 4000 mL

## 2013-07-17 MED ORDER — HYDROMORPHONE HCL PF 1 MG/ML IJ SOLN: 0.2500 mg | INTRAMUSCULAR | Status: DC | PRN

## 2013-07-17 MED ORDER — PROPOFOL 10 MG/ML IV BOLUS
INTRAVENOUS | Status: DC | PRN
  Administered 2013-07-17: 130 mg via INTRAVENOUS

## 2013-07-17 MED ORDER — EPHEDRINE SULFATE 50 MG/ML IJ SOLN
INTRAMUSCULAR | Status: DC | PRN
  Administered 2013-07-17 (×2): 5 mg via INTRAVENOUS

## 2013-07-17 MED ORDER — GLYCOPYRROLATE 0.2 MG/ML IJ SOLN
INTRAMUSCULAR | Status: DC | PRN
  Administered 2013-07-17: 0.4 mg via INTRAVENOUS

## 2013-07-17 MED ORDER — EPHEDRINE SULFATE 50 MG/ML IJ SOLN
INTRAMUSCULAR | Status: AC
  Filled 2013-07-17: qty 1

## 2013-07-17 MED ORDER — SUCCINYLCHOLINE CHLORIDE 20 MG/ML IJ SOLN
INTRAMUSCULAR | Status: DC | PRN
  Administered 2013-07-17: 100 mg via INTRAVENOUS

## 2013-07-17 MED ORDER — MIDAZOLAM HCL 5 MG/5ML IJ SOLN
INTRAMUSCULAR | Status: DC | PRN
  Administered 2013-07-17: 1 mg via INTRAVENOUS

## 2013-07-17 MED ORDER — MIDAZOLAM HCL 2 MG/2ML IJ SOLN
INTRAMUSCULAR | Status: AC
  Filled 2013-07-17: qty 2

## 2013-07-17 MED ORDER — DEXTROSE 5 % IV SOLN
2.0000 g | INTRAVENOUS | Status: AC
  Administered 2013-07-17: 2 g via INTRAVENOUS
  Filled 2013-07-17: qty 2

## 2013-07-17 MED ORDER — NEOSTIGMINE METHYLSULFATE 1 MG/ML IJ SOLN
INTRAMUSCULAR | Status: AC
  Filled 2013-07-17: qty 10

## 2013-07-17 MED ORDER — LIDOCAINE HCL (CARDIAC) 20 MG/ML IV SOLN
INTRAVENOUS | Status: AC
  Filled 2013-07-17: qty 5

## 2013-07-17 MED ORDER — ONDANSETRON HCL 4 MG/2ML IJ SOLN: 4.0000 mg | INTRAMUSCULAR | Status: DC | PRN

## 2013-07-17 MED ORDER — ROCURONIUM BROMIDE 100 MG/10ML IV SOLN
INTRAVENOUS | Status: DC | PRN
  Administered 2013-07-17: 30 mg via INTRAVENOUS
  Administered 2013-07-17: 10 mg via INTRAVENOUS

## 2013-07-17 MED ORDER — LEVOTHYROXINE SODIUM 50 MCG PO TABS: 50.0000 ug | ORAL_TABLET | Freq: Every day | ORAL | Status: DC

## 2013-07-17 MED ORDER — DEXAMETHASONE SODIUM PHOSPHATE 10 MG/ML IJ SOLN
INTRAMUSCULAR | Status: AC
  Filled 2013-07-17: qty 1

## 2013-07-17 MED ORDER — DIPHENHYDRAMINE HCL 12.5 MG/5ML PO ELIX: 12.5000 mg | ORAL_SOLUTION | Freq: Four times a day (QID) | ORAL | Status: DC | PRN

## 2013-07-17 MED ORDER — PANTOPRAZOLE SODIUM 40 MG IV SOLR
40.0000 mg | INTRAVENOUS | Status: DC
Start: 2013-07-17 — End: 2013-07-21
  Administered 2013-07-17 – 2013-07-20 (×4): 40 mg via INTRAVENOUS
  Filled 2013-07-17 (×5): qty 40

## 2013-07-17 MED ORDER — FENTANYL CITRATE 0.05 MG/ML IJ SOLN
INTRAMUSCULAR | Status: AC
  Filled 2013-07-17: qty 5

## 2013-07-17 MED ORDER — HEPARIN SODIUM (PORCINE) 5000 UNIT/ML IJ SOLN
5000.0000 [IU] | Freq: Three times a day (TID) | INTRAMUSCULAR | Status: DC
  Administered 2013-07-18 – 2013-07-21 (×9): 5000 [IU] via SUBCUTANEOUS
  Filled 2013-07-17 (×12): qty 1

## 2013-07-17 MED ORDER — NALOXONE HCL 0.4 MG/ML IJ SOLN: 0.4000 mg | INTRAMUSCULAR | Status: DC | PRN

## 2013-07-17 MED ORDER — DEXTROSE 5 % IV SOLN
2.0000 g | Freq: Two times a day (BID) | INTRAVENOUS | Status: AC
  Administered 2013-07-17: 2 g via INTRAVENOUS
  Filled 2013-07-17: qty 2

## 2013-07-17 MED ORDER — PROPOFOL 10 MG/ML IV BOLUS
INTRAVENOUS | Status: AC
  Filled 2013-07-17: qty 20

## 2013-07-17 MED ORDER — NEOSTIGMINE METHYLSULFATE 1 MG/ML IJ SOLN
INTRAMUSCULAR | Status: DC | PRN
  Administered 2013-07-17: 3 mg via INTRAVENOUS

## 2013-07-17 MED ORDER — SODIUM CHLORIDE 0.9 % IJ SOLN: 9.0000 mL | INTRAMUSCULAR | Status: DC | PRN

## 2013-07-17 MED ORDER — LACTATED RINGERS IV SOLN
INTRAVENOUS | Status: DC
  Administered 2013-07-17 (×2): via INTRAVENOUS
  Administered 2013-07-17: 1000 mL via INTRAVENOUS

## 2013-07-17 MED ORDER — BUPIVACAINE HCL (PF) 0.5 % IJ SOLN
INTRAMUSCULAR | Status: AC
  Filled 2013-07-17: qty 60

## 2013-07-17 MED ORDER — BUPIVACAINE HCL (PF) 0.5 % IJ SOLN
INTRAMUSCULAR | Status: DC | PRN
Start: 2013-07-17 — End: 2013-07-17
  Administered 2013-07-17: 12 mL

## 2013-07-17 MED ORDER — ALVIMOPAN 12 MG PO CAPS
12.0000 mg | ORAL_CAPSULE | Freq: Once | ORAL | Status: AC
  Administered 2013-07-17: 12 mg via ORAL
  Filled 2013-07-17: qty 1

## 2013-07-17 MED ORDER — DEXAMETHASONE SODIUM PHOSPHATE 10 MG/ML IJ SOLN
INTRAMUSCULAR | Status: DC | PRN
  Administered 2013-07-17: 10 mg via INTRAVENOUS

## 2013-07-17 MED ORDER — BIOTENE DRY MOUTH MT LIQD
15.0000 mL | Freq: Two times a day (BID) | OROMUCOSAL | Status: DC
  Administered 2013-07-18 – 2013-07-20 (×6): 15 mL via OROMUCOSAL

## 2013-07-17 MED ORDER — CHLORHEXIDINE GLUCONATE 0.12 % MT SOLN
15.0000 mL | Freq: Two times a day (BID) | OROMUCOSAL | Status: DC
  Administered 2013-07-17 – 2013-07-20 (×6): 15 mL via OROMUCOSAL
  Filled 2013-07-17 (×8): qty 15

## 2013-07-17 MED ORDER — LEVOTHYROXINE SODIUM 50 MCG PO TABS
50.0000 ug | ORAL_TABLET | ORAL | Status: DC
  Administered 2013-07-20: 50 ug via ORAL
  Filled 2013-07-17: qty 1

## 2013-07-17 MED ORDER — LEVOTHYROXINE SODIUM 75 MCG PO TABS
75.0000 ug | ORAL_TABLET | ORAL | Status: DC
  Administered 2013-07-19: 75 ug via ORAL
  Filled 2013-07-17 (×2): qty 1

## 2013-07-17 MED ORDER — FENTANYL CITRATE 0.05 MG/ML IJ SOLN
INTRAMUSCULAR | Status: DC | PRN
  Administered 2013-07-17 (×5): 50 ug via INTRAVENOUS
  Administered 2013-07-17: 100 ug via INTRAVENOUS
  Administered 2013-07-17 (×2): 50 ug via INTRAVENOUS

## 2013-07-17 MED ORDER — ONDANSETRON HCL 4 MG/2ML IJ SOLN
INTRAMUSCULAR | Status: DC | PRN
  Administered 2013-07-17: 4 mg via INTRAVENOUS

## 2013-07-17 MED ORDER — LACTATED RINGERS IV SOLN
INTRAVENOUS | Status: DC | PRN
  Administered 2013-07-17: 1

## 2013-07-17 MED ORDER — LIDOCAINE HCL (CARDIAC) 20 MG/ML IV SOLN
INTRAVENOUS | Status: DC | PRN
  Administered 2013-07-17: 60 mg via INTRAVENOUS

## 2013-07-17 MED ORDER — MORPHINE SULFATE (PF) 1 MG/ML IV SOLN
INTRAVENOUS | Status: DC
  Administered 2013-07-17: 18:00:00 via INTRAVENOUS
  Administered 2013-07-17: 4.5 mg via INTRAVENOUS
  Administered 2013-07-18: 3.3 mg via INTRAVENOUS
  Administered 2013-07-18: 1.5 mg via INTRAVENOUS
  Administered 2013-07-18 (×2): 3 mg via INTRAVENOUS
  Administered 2013-07-18 (×2): 4.5 mg via INTRAVENOUS
  Administered 2013-07-19: 46.41 mg via INTRAVENOUS
  Administered 2013-07-19 (×2): 3 mg via INTRAVENOUS
  Administered 2013-07-19: 6 mg via INTRAVENOUS
  Administered 2013-07-19: 2.64 mg via INTRAVENOUS
  Administered 2013-07-19: 6 mg via INTRAVENOUS
  Administered 2013-07-19: 14:00:00 via INTRAVENOUS
  Administered 2013-07-20: 6 mg via INTRAVENOUS
  Administered 2013-07-20: 1.5 mg via INTRAVENOUS
  Administered 2013-07-20: 4.5 mg via INTRAVENOUS
  Filled 2013-07-17 (×2): qty 25

## 2013-07-17 MED ORDER — MORPHINE SULFATE (PF) 1 MG/ML IV SOLN
INTRAVENOUS | Status: AC
  Filled 2013-07-17: qty 25

## 2013-07-17 MED ORDER — DEXTROSE IN LACTATED RINGERS 5 % IV SOLN
INTRAVENOUS | Status: DC
  Administered 2013-07-17 – 2013-07-18 (×2): via INTRAVENOUS
  Administered 2013-07-18 – 2013-07-19 (×2): 125 mL via INTRAVENOUS
  Administered 2013-07-19 – 2013-07-20 (×2): via INTRAVENOUS

## 2013-07-17 MED ORDER — SODIUM CHLORIDE 0.9 % IJ SOLN
INTRAMUSCULAR | Status: AC
  Filled 2013-07-17: qty 10

## 2013-07-17 SURGICAL SUPPLY — 73 items
APPLIER CLIP 5 13 M/L LIGAMAX5 (MISCELLANEOUS)
APPLIER CLIP ROT 10 11.4 M/L (STAPLE)
APR CLP MED LRG 11.4X10 (STAPLE)
APR CLP MED LRG 5 ANG JAW (MISCELLANEOUS)
BLADE EXTENDED COATED 6.5IN (ELECTRODE) ×2 IMPLANT
BLADE HEX COATED 2.75 (ELECTRODE) ×3 IMPLANT
BLADE SURG SZ10 CARB STEEL (BLADE) ×3 IMPLANT
CABLE HIGH FREQUENCY MONO STRZ (ELECTRODE) ×2 IMPLANT
CANISTER SUCTION 2500CC (MISCELLANEOUS) ×1 IMPLANT
CELLS DAT CNTRL 66122 CELL SVR (MISCELLANEOUS) IMPLANT
CLIP APPLIE 5 13 M/L LIGAMAX5 (MISCELLANEOUS) IMPLANT
CLIP APPLIE ROT 10 11.4 M/L (STAPLE) IMPLANT
COVER MAYO STAND STRL (DRAPES) ×3 IMPLANT
DECANTER SPIKE VIAL GLASS SM (MISCELLANEOUS) ×1 IMPLANT
DISSECTOR BLUNT TIP ENDO 5MM (MISCELLANEOUS) IMPLANT
DRAPE LAPAROSCOPIC ABDOMINAL (DRAPES) ×2 IMPLANT
DRAPE LG THREE QUARTER DISP (DRAPES) ×2 IMPLANT
DRAPE UTILITY XL STRL (DRAPES) ×3 IMPLANT
DRAPE WARM FLUID 44X44 (DRAPE) ×2 IMPLANT
DRSG OPSITE POSTOP 4X6 (GAUZE/BANDAGES/DRESSINGS) ×1 IMPLANT
ELECT REM PT RETURN 9FT ADLT (ELECTROSURGICAL) ×2
ELECTRODE REM PT RTRN 9FT ADLT (ELECTROSURGICAL) ×1 IMPLANT
FILTER SMOKE EVAC LAPAROSHD (FILTER) ×1 IMPLANT
GLOVE BIOGEL PI IND STRL 7.0 (GLOVE) ×1 IMPLANT
GLOVE BIOGEL PI INDICATOR 7.0 (GLOVE) ×3
GLOVE ECLIPSE 8.0 STRL XLNG CF (GLOVE) ×5 IMPLANT
GLOVE INDICATOR 8.0 STRL GRN (GLOVE) ×4 IMPLANT
GOWN STRL REUS W/TWL LRG LVL3 (GOWN DISPOSABLE) ×1 IMPLANT
GOWN STRL REUS W/TWL XL LVL3 (GOWN DISPOSABLE) ×10 IMPLANT
HEMOSTAT SNOW SURGICEL 2X4 (HEMOSTASIS) ×1 IMPLANT
KIT BASIN OR (CUSTOM PROCEDURE TRAY) ×2 IMPLANT
LEGGING LITHOTOMY PAIR STRL (DRAPES) IMPLANT
LIGASURE IMPACT 36 18CM CVD LR (INSTRUMENTS) ×1 IMPLANT
NS IRRIG 1000ML POUR BTL (IV SOLUTION) ×6 IMPLANT
PENCIL BUTTON HOLSTER BLD 10FT (ELECTRODE) ×3 IMPLANT
RETRACTOR WND ALEXIS 18 MED (MISCELLANEOUS) IMPLANT
RTRCTR WOUND ALEXIS 18CM MED (MISCELLANEOUS)
SCALPEL HARMONIC ACE (MISCELLANEOUS) ×1 IMPLANT
SCISSORS LAP 5X35 DISP (ENDOMECHANICALS) ×2 IMPLANT
SET IRRIG TUBING LAPAROSCOPIC (IRRIGATION / IRRIGATOR) ×1 IMPLANT
SLEEVE XCEL OPT CAN 5 100 (ENDOMECHANICALS) IMPLANT
SOLUTION ANTI FOG 6CC (MISCELLANEOUS) ×2 IMPLANT
SPONGE GAUZE 4X4 12PLY (GAUZE/BANDAGES/DRESSINGS) ×2 IMPLANT
SPONGE LAP 18X18 X RAY DECT (DISPOSABLE) ×5 IMPLANT
STAPLER 90 3.5 STAND SLIM (STAPLE) ×2
STAPLER 90 3.5 STD SLIM (STAPLE) IMPLANT
STAPLER PROXIMATE 75MM BLUE (STAPLE) ×1 IMPLANT
STAPLER VISISTAT 35W (STAPLE) ×2 IMPLANT
SUCTION POOLE TIP (SUCTIONS) ×2 IMPLANT
SUT PDS AB 1 CTX 36 (SUTURE) ×2 IMPLANT
SUT PDS AB 1 TP1 96 (SUTURE) IMPLANT
SUT PROLENE 2 0 SH DA (SUTURE) IMPLANT
SUT SILK 2 0 (SUTURE) ×2
SUT SILK 2 0 SH CR/8 (SUTURE) ×2 IMPLANT
SUT SILK 2-0 18XBRD TIE 12 (SUTURE) ×1 IMPLANT
SUT SILK 3 0 (SUTURE) ×2
SUT SILK 3 0 SH CR/8 (SUTURE) ×2 IMPLANT
SUT SILK 3-0 18XBRD TIE 12 (SUTURE) ×1 IMPLANT
SUT VICRYL 2 0 18  UND BR (SUTURE) ×1
SUT VICRYL 2 0 18 UND BR (SUTURE) ×1 IMPLANT
SYS LAPSCP GELPORT 120MM (MISCELLANEOUS) ×2
SYSTEM LAPSCP GELPORT 120MM (MISCELLANEOUS) IMPLANT
TOWEL OR 17X26 10 PK STRL BLUE (TOWEL DISPOSABLE) ×3 IMPLANT
TRAY FOLEY CATH 14FRSI W/METER (CATHETERS) ×2 IMPLANT
TRAY LAP CHOLE (CUSTOM PROCEDURE TRAY) ×2 IMPLANT
TROCAR BLADELESS OPT 5 100 (ENDOMECHANICALS) IMPLANT
TROCAR BLADELESS OPT 5 75 (ENDOMECHANICALS) ×7 IMPLANT
TROCAR XCEL BLUNT TIP 100MML (ENDOMECHANICALS) IMPLANT
TROCAR XCEL NON-BLD 11X100MML (ENDOMECHANICALS) IMPLANT
TROCAR XCEL UNIV SLVE 11M 100M (ENDOMECHANICALS) IMPLANT
TUBING INSUFFLATION 10FT LAP (TUBING) ×2 IMPLANT
YANKAUER SUCT BULB TIP 10FT TU (MISCELLANEOUS) ×2 IMPLANT
YANKAUER SUCT BULB TIP NO VENT (SUCTIONS) ×2 IMPLANT

## 2013-07-17 NOTE — H&P (Signed)
Leslie Rivera is an 68 y.o. female.   Chief Complaint: Her for elective partial colectomy HPI:   She had been feeling weak and tired. She had her annual visit with her primary care physician and was found to be anemic. Her hemoglobin was 9.6. She underwent upper and lower endoscopy. Colonoscopy demonstrated a lesion in the ascending colon near the ileocecal valve. 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  Her maternal grandmother had colon cancer. She denies any changes in her bowel habits. No obvious evidence of metastatic disease noted on CT scan.   Past Medical History  Diagnosis Date  . Irregular heart beat     occ. PVC's  . Hypothyroidism   . Anxiety   . History of migraine headaches   . Adenomatous colon polyp   . Internal hemorrhoids   . Anemia     Iron deficiency anemia  . Abdominal pain   . Periumbilical pain   . Cancer     Past Surgical History  Procedure Laterality Date  . Breast surgery      removal abnormal cells in right  . Tonsillectomy      teenager- age 32    Family History  Problem Relation Age of Onset  . Cancer Brother     internal melanoma  . Cancer Maternal Grandmother     colon   Social History:  reports that she quit smoking about 16 years ago. She has never used smokeless tobacco. She reports that she drinks alcohol. She reports that she does not use illicit drugs.  Allergies:  Allergies  Allergen Reactions  . Hydrocodone Nausea And Vomiting    Can tolerate Oxycodone  . Erythromycin     GI upset    Medications Prior to Admission  Medication Sig Dispense Refill  . calcium carbonate (OS-CAL) 600 MG TABS tablet Take 600 mg by mouth daily with breakfast.       . levothyroxine (SYNTHROID, LEVOTHROID) 50 MCG tablet Take 50-75 mcg by mouth daily. 1.5 tablets for 2 days then 1 tablet and repeats cycle.      . Omega-3 Fatty Acids (FISH OIL) 1000 MG CAPS Take 1 capsule by mouth daily.       Marland Kitchen OVER THE COUNTER  MEDICATION Take 1 tablet by mouth daily.       . Probiotic Product (PROBIOTIC DAILY PO) Take 1 tablet by mouth daily.       Marland Kitchen zolpidem (AMBIEN) 10 MG tablet Take 10 mg by mouth at bedtime as needed for sleep.       . diazepam (VALIUM) 5 MG tablet Take 5 mg by mouth every 8 (eight) hours as needed for anxiety.       . ondansetron (ZOFRAN) 4 MG tablet Take 1 tablet (4 mg total) by mouth every 4 (four) hours.  10 tablet  0    No results found for this or any previous visit (from the past 48 hour(s)). No results found.  Review of Systems  Constitutional: Negative for fever and chills.  Gastrointestinal: Negative for abdominal pain and diarrhea.    Blood pressure 108/80, pulse 67, temperature 97.6 F (36.4 C), temperature source Oral, resp. rate 18, SpO2 100.00%. Physical Exam  Constitutional: She appears well-developed and well-nourished. No distress.  HENT:  Head: Normocephalic and atraumatic.  Cardiovascular: Normal rate and regular rhythm.   Respiratory: Effort normal and breath sounds normal.  GI: Soft. She exhibits no mass. There is no tenderness.  Musculoskeletal: She exhibits  no edema.  Neurological: She is alert.  Skin: Skin is warm and dry.     Assessment/Plan Invasive carcinoma of right colon  Plan:  Laparoscopic assisted partial colectomy.  Leslie Rivera J 07/17/2013, 2:29 PM

## 2013-07-17 NOTE — Preoperative (Signed)
Beta Blockers   Reason not to administer Beta Blockers:Not Applicable 

## 2013-07-17 NOTE — Anesthesia Postprocedure Evaluation (Signed)
  Anesthesia Post-op Note  Patient: Leslie Rivera  Procedure(s) Performed: Procedure(s) (LRB): LAPAROSCOPIC ASSISTED  RIGHT COLECTOMY (Right)  Patient Location: PACU  Anesthesia Type: General  Level of Consciousness: awake and alert   Airway and Oxygen Therapy: Patient Spontanous Breathing  Post-op Pain: mild  Post-op Assessment: Post-op Vital signs reviewed, Patient's Cardiovascular Status Stable, Respiratory Function Stable, Patent Airway and No signs of Nausea or vomiting  Last Vitals:  Filed Vitals:   07/17/13 2000  BP:   Pulse:   Temp:   Resp: 12    Post-op Vital Signs: stable   Complications: No apparent anesthesia complications

## 2013-07-17 NOTE — Op Note (Signed)
Operative Note  Leslie Rivera female 68 y.o. 07/17/2013  PREOPERATIVE DX:  Invasive adenocarcinoma of the ascending colon  POSTOPERATIVE DX:  Same with nodule on left lobe of liver  PROCEDURE:  Laparoscopic-assisted right (ascending) colectomy.  Wedge resection of liver nodule.         Surgeon: Odis Hollingshead   Assistants: Michael Boston M.D.  Anesthesia: General endotracheal anesthesia  Indications: This is a 68 year old female was noted to be anemic on her annual exam. Colonoscopy demonstrated a lesion in the right colon. This was biopsied and positive for invasive adenocarcinoma. No metastatic disease is noted on CT scan. She now presents for the above procedure.    Procedure Detail:  She was seen in the holding room. She was brought to the operating room placed supine on the operating table and a general anesthetic was given. A Foley catheter and oral gastric tube were inserted. The abdominal wall was sterilely prepped and draped.  A timeout was performed. Following this, a 5 mm incision was made in the left subcostal area. She was placed in slight reversed Trendelenburg. Using a 5 mm Optiview trocar and laparoscope I gained access to the peritoneal cavity and create a pneumoperitoneum. Inspection of the area under trocar demonstrated no evidence of bleeding or organ injury. A 5 mm trocar was placed in the left lower quadrant. A 5 mm trocar was placed in the lower midline just inferior to the umbilicus. A 5 mm trocar was placed in the right lower quadrant.  I identified the ascending colon and it was very mobile. I could see abnormality of the ascending colon consistent with the tumor.  There was a small white nodule noted on the anterior surface of the left lobe of the liver. No peritoneal nodules were noted. No pelvic nodules were noted.   The ascending colon was mobilized by dividing its lateral attachments using the harmonic scalpel. I then mobilized the hepatic flexure and  the proximal half of the transverse colon using Harmonic Scalpel and blunt dissection. The distal ileum was also mobilized dividing some of its lateral attachments. The distal ileum, right colon, and proximal half of the transverse colon were very mobile at this point and were able to be brought below the umbilicus. A limited lower midline incision was made after removal of the trocar in the lower midline. The peritoneal cavity was entered and a wound protection device was placed.  The distal ileum, right colon, and proximal half of the transverse colon were exteriorized. I divided the mesentery beginning at the distal ileal area. A wedge resection of mesentery was performed using the LigaSure in ligating vessels. I carried this up to the transverse colon just proximal to the middle colic vessels. I then performed a side to side distal ileum to mid transverse colon anastomosis. There is no bleeding from the staple line. I then closed the common defect and completed the resection with the linear noncutting stapler. The specimen was handed off the field. The anastomosis was patent, viable, and under no tension. A crotch stitch of 3-0 silk was placed. The corners of the common defect closure were imbricated using 3-0 silk Lembert type sutures.  Next, I performed a laparoscopic wedge resection of the left anterior liver nodule using the harmonic scalpel. The specimen was passed off the field. Bleeding was controlled with electrocautery.  Copious irrigation was performed. The fluid was evacuated. There was no evidence of bleeding or organ injury. The trocars were removed.  The fashion of a  limited lower midline incision was closed with running #1 PDS suture. The subcutaneous tissue was irrigated. The skin of this incision was closed with staples. The skin incisions at the trocar sites were closed with 4-0 Monocryl subcuticular stitches. Sterile dressings were applied.  She tolerated the procedure well without any  apparent complications and was taken to the recovery room in satisfactory condition.   Estimated Blood Loss:  200 mL         Drains: none  Blood Given: none          Specimens: Right colon, liver nodule (left lobe)        Complications:  * No complications entered in OR log *         Disposition: PACU - hemodynamically stable.         Condition: stable

## 2013-07-17 NOTE — Transfer of Care (Signed)
Immediate Anesthesia Transfer of Care Note  Patient: Leslie Rivera  Procedure(s) Performed: Procedure(s) (LRB): LAPAROSCOPIC ASSISTED  RIGHT COLECTOMY (Right)  Patient Location: PACU  Anesthesia Type: General  Level of Consciousness: sedated, patient cooperative and responds to stimulation  Airway & Oxygen Therapy: Patient Spontanous Breathing and Patient connected to face mask oxgen  Post-op Assessment: Report given to PACU RN and Post -op Vital signs reviewed and stable  Post vital signs: Reviewed and stable  Complications: No apparent anesthesia complications

## 2013-07-17 NOTE — Interval H&P Note (Signed)
History and Physical Interval Note:  07/17/2013 2:32 PM  Leslie Rivera  has presented today for surgery, with the diagnosis of ascending colon cancer  The various methods of treatment have been discussed with the patient and family. After consideration of risks, benefits and other options for treatment, the patient has consented to  Procedure(s): LAPAROSCOPIC ASSISTED  RIGHT COLECTOMY (N/A) as a surgical intervention .  [name] history has been reviewed, patient examined, no change in status, stable for surgery.  I have reviewed the patient's chart and labs.  Questions were answered to the patient's satisfaction.     Kees Idrovo Lenna Sciara

## 2013-07-17 NOTE — Anesthesia Preprocedure Evaluation (Signed)
Anesthesia Evaluation  Patient identified by MRN, date of birth, ID band Patient awake    Reviewed: Allergy & Precautions, H&P , NPO status , Patient's Chart, lab work & pertinent test results  Airway Mallampati: II TM Distance: >3 FB Neck ROM: Full    Dental no notable dental hx.    Pulmonary neg pulmonary ROS, former smoker,  breath sounds clear to auscultation  Pulmonary exam normal       Cardiovascular negative cardio ROS  Rhythm:Regular Rate:Normal     Neuro/Psych negative neurological ROS  negative psych ROS   GI/Hepatic negative GI ROS, Neg liver ROS,   Endo/Other  Hypothyroidism   Renal/GU negative Renal ROS  negative genitourinary   Musculoskeletal negative musculoskeletal ROS (+)   Abdominal   Peds negative pediatric ROS (+)  Hematology negative hematology ROS (+)   Anesthesia Other Findings   Reproductive/Obstetrics negative OB ROS                           Anesthesia Physical Anesthesia Plan  ASA: II  Anesthesia Plan: General   Post-op Pain Management:    Induction: Intravenous  Airway Management Planned: Oral ETT  Additional Equipment:   Intra-op Plan:   Post-operative Plan: Extubation in OR  Informed Consent: I have reviewed the patients History and Physical, chart, labs and discussed the procedure including the risks, benefits and alternatives for the proposed anesthesia with the patient or authorized representative who has indicated his/her understanding and acceptance.   Dental advisory given  Plan Discussed with: CRNA and Surgeon  Anesthesia Plan Comments:         Anesthesia Quick Evaluation  

## 2013-07-18 ENCOUNTER — Encounter (HOSPITAL_COMMUNITY): Payer: Self-pay | Admitting: General Surgery

## 2013-07-18 LAB — BASIC METABOLIC PANEL
BUN: 10 mg/dL (ref 6–23)
CHLORIDE: 105 meq/L (ref 96–112)
CO2: 22 mEq/L (ref 19–32)
CREATININE: 0.69 mg/dL (ref 0.50–1.10)
Calcium: 8.4 mg/dL (ref 8.4–10.5)
GFR calc non Af Amer: 89 mL/min — ABNORMAL LOW (ref >90)
Glucose, Bld: 164 mg/dL — ABNORMAL HIGH (ref 70–99)
Potassium: 4.5 mEq/L (ref 3.7–5.3)
Sodium: 138 mEq/L (ref 137–147)

## 2013-07-18 LAB — CBC
HEMATOCRIT: 33.1 % — AB (ref 36.0–46.0)
HEMOGLOBIN: 10.2 g/dL — AB (ref 12.0–15.0)
MCH: 24.5 pg — ABNORMAL LOW (ref 26.0–34.0)
MCHC: 30.8 g/dL (ref 30.0–36.0)
MCV: 79.6 fL (ref 78.0–100.0)
Platelets: 318 10*3/uL (ref 150–400)
RBC: 4.16 MIL/uL (ref 3.87–5.11)
RDW: 20.5 % — AB (ref 11.5–15.5)
WBC: 7.6 10*3/uL (ref 4.0–10.5)

## 2013-07-18 MED ORDER — ALVIMOPAN 12 MG PO CAPS
12.0000 mg | ORAL_CAPSULE | Freq: Two times a day (BID) | ORAL | Status: DC
  Administered 2013-07-18 – 2013-07-19 (×4): 12 mg via ORAL
  Filled 2013-07-18 (×6): qty 1

## 2013-07-18 MED ORDER — SIMETHICONE 80 MG PO CHEW
80.0000 mg | CHEWABLE_TABLET | Freq: Four times a day (QID) | ORAL | Status: DC | PRN
  Administered 2013-07-18 – 2013-07-20 (×5): 80 mg via ORAL
  Filled 2013-07-18 (×6): qty 1

## 2013-07-18 MED ORDER — SODIUM CHLORIDE 0.9 % IV BOLUS (SEPSIS)
500.0000 mL | Freq: Once | INTRAVENOUS | Status: AC
  Administered 2013-07-18: 500 mL via INTRAVENOUS

## 2013-07-18 MED ORDER — KETOROLAC TROMETHAMINE 15 MG/ML IJ SOLN
15.0000 mg | Freq: Four times a day (QID) | INTRAMUSCULAR | Status: AC
  Administered 2013-07-18 – 2013-07-19 (×6): 15 mg via INTRAVENOUS
  Filled 2013-07-18 (×8): qty 1

## 2013-07-18 NOTE — Progress Notes (Signed)
1 Day Post-Op  Subjective: She c/o sharp, spasmodic upper abdominal pains and some pain in the left shoulder area.  Hurts to take a deep breath.  Objective: Vital signs in last 24 hours: Temp:  [97.4 F (36.3 C)-98.2 F (36.8 C)] 98.1 F (36.7 C) (01/23 0630) Pulse Rate:  [58-82] 82 (01/23 0630) Resp:  [8-18] 16 (01/23 0744) BP: (109-137)/(67-84) 112/82 mmHg (01/23 0630) SpO2:  [94 %-100 %] 100 % (01/23 0744) Weight:  [142 lb 8 oz (64.638 kg)] 142 lb 8 oz (64.638 kg) (01/22 1931) Last BM Date: 07/17/13  Intake/Output from previous day: 01/22 0701 - 01/23 0700 In: 4660.4 [I.V.:4610.4; IV Piggyback:50] Out: 725 [Urine:575; Blood:150] Intake/Output this shift:    PE: General- In NAD Abdomen-soft, non-tender, non-distended, few bowels sounds, some ecchymosis to the left of the lower midline incision  Lab Results:   Recent Labs  07/18/13 0554  WBC 7.6  HGB 10.2*  HCT 33.1*  PLT 318   BMET  Recent Labs  07/18/13 0554  NA 138  K 4.5  CL 105  CO2 22  GLUCOSE 164*  BUN 10  CREATININE 0.69  CALCIUM 8.4   PT/INR No results found for this basename: LABPROT, INR,  in the last 72 hours Comprehensive Metabolic Panel:    Component Value Date/Time   NA 138 07/18/2013 0554   NA 136* 07/10/2013 0900   K 4.5 07/18/2013 0554   K 5.3 07/10/2013 0900   CL 105 07/18/2013 0554   CL 101 07/10/2013 0900   CO2 22 07/18/2013 0554   CO2 23 07/10/2013 0900   BUN 10 07/18/2013 0554   BUN 14 07/10/2013 0900   CREATININE 0.69 07/18/2013 0554   CREATININE 0.67 07/10/2013 0900   GLUCOSE 164* 07/18/2013 0554   GLUCOSE 95 07/10/2013 0900   CALCIUM 8.4 07/18/2013 0554   CALCIUM 9.8 07/10/2013 0900   AST 21 07/10/2013 0900   ALT 11 07/10/2013 0900   ALKPHOS 81 07/10/2013 0900   BILITOT 0.4 07/10/2013 0900   PROT 7.3 07/10/2013 0900   ALBUMIN 3.7 07/10/2013 0900     Studies/Results: No results found.  Anti-infectives: Anti-infectives   Start     Dose/Rate Route Frequency Ordered Stop   07/17/13 2200  cefoTEtan (CEFOTAN) 2 g in dextrose 5 % 50 mL IVPB     2 g 100 mL/hr over 30 Minutes Intravenous Every 12 hours 07/17/13 1837 07/17/13 2204   07/17/13 0827  cefOXitin (MEFOXIN) 2 g in dextrose 5 % 50 mL IVPB     2 g 100 mL/hr over 30 Minutes Intravenous On call to O.R. 07/17/13 0827 07/17/13 1454      Assessment Principal Problem:   Cancer of ascending colon s/p partial colectomy 07/17/13-intermittent, spasmodic abdominal pains; borderline urine output; ABL anemia-acute on chronic     LOS: 1 day   Plan: Keep on ice chips.  Add Toradol. Recheck hemoglobin tomorrow.  IVF bolus.   Ciel Yanes J 07/18/2013

## 2013-07-18 NOTE — Progress Notes (Signed)
Patient c/o gas pains in abd unrelieved per walking. Requested mylicon for gas relief. Dr. Zella Richer returned call with t.o.v. For mylicon 80mg  every 6 hours prn gas.

## 2013-07-18 NOTE — Progress Notes (Signed)
INITIAL NUTRITION ASSESSMENT  DOCUMENTATION CODES Per approved criteria  -Not Applicable   INTERVENTION: - Diet advancement per MD - Encouraged Ensure Complete BID once diet advanced - Will continue to monitor   NUTRITION DIAGNOSIS: Inadequate oral intake related to inability to eat as evidenced by NPO.    Goal: Advance diet as tolerated to regular diet   Monitor:  Weights, labs, diet advancement   Reason for Assessment: Malnutrition screening tool   68 y.o. female  Admitting Dx: Cancer of ascending colon  ASSESSMENT: Admitted for elective partial colectomy. She had been feeling weak and tired. She had her annual visit with her primary care physician and was found to be anemic. She underwent upper and lower endoscopy. Colonoscopy demonstrated a lesion in the ascending colon near the ileocecal valve. 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 POD#1 laparoscopic-assisted right colectomy.   Met with pt who reports eating smaller portions for the past 3 weeks and has lost 5 pounds. Pt bought some Ensure at home but has not consumed any yet. Pt c/o soreness from surgery but had only recently gotten pain medicine.    Height: Ht Readings from Last 1 Encounters:  07/17/13 5' 6.5" (1.689 m)    Weight: Wt Readings from Last 1 Encounters:  07/17/13 142 lb 8 oz (64.638 kg)    Ideal Body Weight: 130 lb   % Ideal Body Weight: 109%  Wt Readings from Last 10 Encounters:  07/17/13 142 lb 8 oz (64.638 kg)  07/17/13 142 lb 8 oz (64.638 kg)  07/10/13 142 lb 8 oz (64.638 kg)  06/11/13 143 lb 6.4 oz (65.046 kg)    Usual Body Weight: 147 lb per pt  % Usual Body Weight: 96%  BMI:  Body mass index is 22.66 kg/(m^2).  Estimated Nutritional Needs: Kcal: 3149-7026 Protein: 80-95g Fluid: 1.6-1.8L/day  Skin: Abdominal incision   Diet Order: NPO  EDUCATION NEEDS: -No education needs identified at this time   Intake/Output Summary (Last 24 hours)  at 07/18/13 1114 Last data filed at 07/18/13 1000  Gross per 24 hour  Intake 4660.42 ml  Output    825 ml  Net 3835.42 ml    Last BM: 1/22   Labs:   Recent Labs Lab 07/18/13 0554  NA 138  K 4.5  CL 105  CO2 22  BUN 10  CREATININE 0.69  CALCIUM 8.4  GLUCOSE 164*    CBG (last 3)  No results found for this basename: GLUCAP,  in the last 72 hours  Scheduled Meds: . antiseptic oral rinse  15 mL Mouth Rinse q12n4p  . chlorhexidine  15 mL Mouth Rinse BID  . heparin subcutaneous  5,000 Units Subcutaneous Q8H  . ketorolac  15 mg Intravenous Q6H  . levothyroxine  75 mcg Oral Q3 days   And  . [START ON 07/19/2013] levothyroxine  75 mcg Oral Q3 days   And  . [START ON 07/20/2013] levothyroxine  50 mcg Oral Q3 days  . morphine   Intravenous Q4H  . pantoprazole (PROTONIX) IV  40 mg Intravenous Q24H  . sodium chloride  500 mL Intravenous Once    Continuous Infusions: . dextrose 5% lactated ringers 125 mL (07/18/13 0455)    Past Medical History  Diagnosis Date  . Irregular heart beat     occ. PVC's  . Hypothyroidism   . Anxiety   . History of migraine headaches   . Adenomatous colon polyp   . Internal hemorrhoids   .  Anemia     Iron deficiency anemia  . Abdominal pain   . Periumbilical pain   . Cancer     Past Surgical History  Procedure Laterality Date  . Breast surgery      removal abnormal cells in right  . Tonsillectomy      teenager- age [address] [place], [place], [place] Pager [phone] After Hours Pager

## 2013-07-19 LAB — CBC
HCT: 28.4 % — ABNORMAL LOW (ref 36.0–46.0)
HEMOGLOBIN: 8.5 g/dL — AB (ref 12.0–15.0)
MCH: 24.4 pg — AB (ref 26.0–34.0)
MCHC: 29.9 g/dL — ABNORMAL LOW (ref 30.0–36.0)
MCV: 81.4 fL (ref 78.0–100.0)
Platelets: 262 10*3/uL (ref 150–400)
RBC: 3.49 MIL/uL — AB (ref 3.87–5.11)
RDW: 21.4 % — ABNORMAL HIGH (ref 11.5–15.5)
WBC: 7.1 10*3/uL (ref 4.0–10.5)

## 2013-07-19 NOTE — Progress Notes (Signed)
Patient ID: Leslie Rivera, female   DOB: [birth year], 68 y.o.   MRN: [id number]  [facility] - Progress Note  POD# 2  Subjective: Patient doing well.  Ambulating in halls.  Wants to eat.  No flatus or BM yet.  No nausea.  Objective: Vital signs in last 24 hours: Temp:  [97.7 F (36.5 C)-99 F (37.2 C)] 97.7 F (36.5 C) (01/24 0532) Pulse Rate:  [56-78] 78 (01/24 0532) Resp:  [11-23] 12 (01/24 0833) BP: (88-139)/(58-71) 101/58 mmHg (01/24 0640) SpO2:  [98 %-100 %] 100 % (01/24 0833) Last BM Date: 07/17/13  Intake/Output from previous day: 01/23 0701 - 01/24 0700 In: 3081.3 [I.V.:3081.3] Out: 985 [Urine:985]  Exam: HEENT - clear, not icteric Neck - soft Chest - clear bilaterally Cor - RRR, no murmur Abd - soft, mild distension; BS present; wounds clear and dry Ext - no significant edema Neuro - grossly intact, no focal deficits  Lab Results:   Recent Labs  07/18/13 0554 07/19/13 0550  WBC 7.6 7.1  HGB 10.2* 8.5*  HCT 33.1* 28.4*  PLT 318 262     Recent Labs  07/18/13 0554  NA 138  K 4.5  CL 105  CO2 22  GLUCOSE 164*  BUN 10  CREATININE 0.69  CALCIUM 8.4    Studies/Results: No results found.  Assessment / Plan: 1.  Status post lap assisted right colectomy  Begin clear liquid diet  Encouraged ambulation  Earnstine Regal, MD, [facility] Office: [phone]  07/19/2013

## 2013-07-20 MED ORDER — OXYCODONE-ACETAMINOPHEN 5-325 MG PO TABS
1.0000 | ORAL_TABLET | ORAL | Status: DC | PRN
  Administered 2013-07-20 (×3): 2 via ORAL
  Administered 2013-07-21 (×3): 1 via ORAL
  Filled 2013-07-20: qty 2
  Filled 2013-07-20: qty 1
  Filled 2013-07-20 (×3): qty 2

## 2013-07-20 MED ORDER — KETOROLAC TROMETHAMINE 15 MG/ML IJ SOLN: 15.0000 mg | Freq: Four times a day (QID) | INTRAMUSCULAR | Status: DC

## 2013-07-20 MED ORDER — KETOROLAC TROMETHAMINE 15 MG/ML IJ SOLN
15.0000 mg | Freq: Four times a day (QID) | INTRAMUSCULAR | Status: DC
  Administered 2013-07-20: 15 mg via INTRAVENOUS

## 2013-07-20 MED ORDER — KETOROLAC TROMETHAMINE 15 MG/ML IJ SOLN
15.0000 mg | Freq: Four times a day (QID) | INTRAMUSCULAR | Status: AC | PRN
  Administered 2013-07-20 – 2013-07-21 (×3): 15 mg via INTRAVENOUS
  Filled 2013-07-20 (×4): qty 1

## 2013-07-20 NOTE — Progress Notes (Signed)
Patient ID: Leslie Rivera, female   DOB: Oct 07, 1945, 68 y.o.   MRN: [id number]  [facility] - Progress Note  POD# 3  Subjective: Patient in bed.  Doing well.  Small BM this AM.  Tolerated clear liquid diet - wants regular diet today.  Objective: Vital signs in last 24 hours: Temp:  [98 F (36.7 C)-98.3 F (36.8 C)] 98.1 F (36.7 C) (01/25 0529) Pulse Rate:  [62-88] 62 (01/25 0529) Resp:  [12-18] 12 (01/25 0808) BP: (101-117)/(68-76) 117/76 mmHg (01/25 0529) SpO2:  [97 %-100 %] 97 % (01/25 0808) Last BM Date: 07/17/13  Intake/Output from previous day: 01/24 0701 - 01/25 0700 In: 1541.3 [P.O.:120; I.V.:1421.3] Out: 1450 [Urine:1450]  Exam: HEENT - clear, not icteric Neck - soft Chest - clear bilaterally Cor - RRR, no murmur Abd - soft without distension; mild tenderness; BS present; wound clear and dry Ext - no significant edema Neuro - grossly intact, no focal deficits  Lab Results:   Recent Labs  07/18/13 0554 07/19/13 0550  WBC 7.6 7.1  HGB 10.2* 8.5*  HCT 33.1* 28.4*  PLT 318 262     Recent Labs  07/18/13 0554  NA 138  K 4.5  CL 105  CO2 22  GLUCOSE 164*  BUN 10  CREATININE 0.69  CALCIUM 8.4    Studies/Results: No results found.  Assessment / Plan: 1.  Status post right colectomy  Advance to regular diet  Discontinue Entereg  PO pain Rx - discontinue PCA  Encourage ambulation  Likely home tomorrow for her birthday!  Earnstine Regal, MD, [facility] Office: [phone]  07/20/2013

## 2013-07-21 MED ORDER — OXYCODONE-ACETAMINOPHEN 5-325 MG PO TABS: 1.0000 | ORAL_TABLET | ORAL | Status: DC | PRN

## 2013-07-21 MED ORDER — STERILE WATER FOR INJECTION IJ SOLN
INTRAMUSCULAR | Status: AC
  Filled 2013-07-21: qty 10

## 2013-07-21 NOTE — Progress Notes (Signed)
4 Days Post-Op  Subjective: Feels good.  Tolerating diet.  Bowels moving.  Pain control adequate   Objective: Vital signs in last 24 hours: Temp:  [97.7 F (36.5 C)-98.4 F (36.9 C)] 97.7 F (36.5 C) (01/26 0506) Pulse Rate:  [61-73] 73 (01/26 0506) Resp:  [16-18] 18 (01/26 0506) BP: (107-114)/(67-74) 109/72 mmHg (01/26 0506) SpO2:  [95 %-98 %] 95 % (01/26 0506) Last BM Date: 07/20/13  Intake/Output from previous day: 01/25 0701 - 01/26 0700 In: 1561.7 [P.O.:360; I.V.:1201.7] Out: 700 [Urine:700] Intake/Output this shift:    PE: General- In NAD Abdomen-soft, incisions are clean and intact  Lab Results:   Recent Labs  07/19/13 0550  WBC 7.1  HGB 8.5*  HCT 28.4*  PLT 262   BMET No results found for this basename: NA, K, CL, CO2, GLUCOSE, BUN, CREATININE, CALCIUM,  in the last 72 hours PT/INR No results found for this basename: LABPROT, INR,  in the last 72 hours Comprehensive Metabolic Panel:    Component Value Date/Time   NA 138 07/18/2013 0554   NA 136* 07/10/2013 0900   K 4.5 07/18/2013 0554   K 5.3 07/10/2013 0900   CL 105 07/18/2013 0554   CL 101 07/10/2013 0900   CO2 22 07/18/2013 0554   CO2 23 07/10/2013 0900   BUN 10 07/18/2013 0554   BUN 14 07/10/2013 0900   CREATININE 0.69 07/18/2013 0554   CREATININE 0.67 07/10/2013 0900   GLUCOSE 164* 07/18/2013 0554   GLUCOSE 95 07/10/2013 0900   CALCIUM 8.4 07/18/2013 0554   CALCIUM 9.8 07/10/2013 0900   AST 21 07/10/2013 0900   ALT 11 07/10/2013 0900   ALKPHOS 81 07/10/2013 0900   BILITOT 0.4 07/10/2013 0900   PROT 7.3 07/10/2013 0900   ALBUMIN 3.7 07/10/2013 0900     Studies/Results: No results found.  Anti-infectives: Anti-infectives   Start     Dose/Rate Route Frequency Ordered Stop   07/17/13 2200  cefoTEtan (CEFOTAN) 2 g in dextrose 5 % 50 mL IVPB     2 g 100 mL/hr over 30 Minutes Intravenous Every 12 hours 07/17/13 1837 07/17/13 2204   07/17/13 0827  cefOXitin (MEFOXIN) 2 g in dextrose 5 % 50 mL IVPB     2  g 100 mL/hr over 30 Minutes Intravenous On call to O.R. 07/17/13 0827 07/17/13 1454      Assessment Principal Problem:   Cancer of ascending colon s/p partial colectomy 07/17/13-progressing well; pathology is pending    LOS: 4 days   Plan: Discharge.  Instructions given.  Follow up in office later this week for staple removal.   Leslie Rivera J 07/21/2013 '

## 2013-07-21 NOTE — Progress Notes (Signed)
Patient ready for d/c home. Discharge instructions given with sister present in room. Patient relay abd staples to be d/c'd in MD office Friday. Prescription given for pain med. This a.m. dose of pain med effective. Ambulatory ad lib. Tolerating breakfast without c/o gas pain. Passed bm prior getting dressed. Voiding on own. Sister to transport home. To vehicle via wheelchair accompanied per staff member with  belongings.

## 2013-07-21 NOTE — Discharge Instructions (Signed)
[facility], [place] [phone]  OPEN ABDOMINAL SURGERY: POST OP INSTRUCTIONS  Always review your discharge instruction sheet given to you by the facility where your surgery was performed.  IF YOU HAVE DISABILITY OR FAMILY LEAVE FORMS, YOU MUST BRING THEM TO THE OFFICE FOR PROCESSING.  PLEASE DO NOT GIVE THEM TO YOUR DOCTOR.  1. A prescription for pain medication may be given to you upon discharge.  Take your pain medication as prescribed, if needed.  If narcotic pain medicine is not needed, then you may take acetaminophen (Tylenol) or ibuprofen (Advil) as needed. 2. Take your usually prescribed medications unless otherwise directed. 3. If you need a refill on your pain medication, please contact your pharmacy. They will contact our office to request authorization.  Prescriptions will not be filled after 5pm or on week-ends. 4. You should follow a lowfat diet. 5. Most patients will experience some swelling and bruising in the area of the incision. Ice pack will help. Swelling and bruising can take several days to resolve..  6. It is common to experience some constipation if taking pain medication after surgery.  Increasing fluid intake and taking a stool softener will usually help or prevent this problem from occurring.  A mild laxative (Milk of Magnesia or Miralax) should be taken according to package directions if there are no bowel movements after 48 hours. 7.  You may have steri-strips (small skin tapes) in place directly over the incision.  These strips should be left on the skin for 7-10 days.  If your surgeon used skin glue on the incision, you may shower in 24 hours.  The glue will flake off over the next 2-3 weeks.  Any sutures or staples will be removed at the office during your follow-up visit. You may find that a light gauze bandage over your incision may keep your staples from being rubbed or pulled. You may shower and replace the bandage daily. 8. ACTIVITIES:  You may  resume regular (light) daily activities beginning the next day--such as daily self-care, walking, climbing stairs--gradually increasing activities as tolerated.  You may have sexual intercourse when it is comfortable.  Refrain from any heavy lifting or straining-nothing over 10 pounds for 6 weeks.  a. You may drive when you no longer are taking prescription pain medication, you can comfortably wear a seatbelt, and you can safely maneuver your car and apply brakes b. Return to Work: ___________________________________ 22. You should see your doctor in the office for a follow-up appointment approximately two weeks after your surgery.  Make sure that you call for this appointment within a day or two after you arrive home to insure a convenient appointment time. OTHER INSTRUCTIONS:  __Call Bernie and arrange to seen by her Friday to have your staples removed.___________________________________________________________ _____________________________________________________________  WHEN TO CALL YOUR DOCTOR: 1. Fever over 101.0 2. Inability to urinate 3. Nausea and/or vomiting 4. Extreme swelling or bruising 5. Continued bleeding from incision. 6. Increased pain, redness, or drainage from the incision.   The clinic staff is available to answer your questions during regular business hours.  Please dont hesitate to call and ask to speak to one of the nurses if you have concerns.  For further questions, please visit [email or web address]

## 2013-07-22 ENCOUNTER — Encounter (INDEPENDENT_AMBULATORY_CARE_PROVIDER_SITE_OTHER): Payer: Self-pay | Admitting: General Surgery

## 2013-07-22 NOTE — Progress Notes (Unsigned)
Patient ID: Leslie Rivera, female   DOB: [birth year], 68 y.o.   MRN: [id number] I discussed her pathology with her-T3N0M0.  I recommended a referral to Medical Oncology and she is in agreement.  She requested Dr. Beryle Beams if he is taking new patients.  We will make the referral.  Pathology results are below.   Diagnosis 1. Liver, biopsy, left - FINDINGS CONSISTENT WITH BENIGN SUBCAPSULAR CYST. - NO EVIDENCE OF MALIGNANCY. 2. Colon, segmental resection for tumor, right colon, terminal ileum - INVASIVE ADENOCARCINOMA, 5 CM. - TUMOR EXTENDS INTO SUBSEROSAL CONNECTIVE TISSUE. - MARGINS NOT INVOLVED. - TWENTY BENIGN LYMPH NODE (0/20). - UNREMARKABLE APPENDIX are below.

## 2013-07-23 ENCOUNTER — Other Ambulatory Visit (INDEPENDENT_AMBULATORY_CARE_PROVIDER_SITE_OTHER): Payer: Self-pay | Admitting: General Surgery

## 2013-07-23 DIAGNOSIS — Z85038 Personal history of other malignant neoplasm of large intestine: Secondary | ICD-10-CM

## 2013-07-24 ENCOUNTER — Encounter: Payer: Self-pay | Admitting: *Deleted

## 2013-07-24 NOTE — Progress Notes (Signed)
Received referral in EPIC and this pt needs to be scheduled by Tiff in [facility] due to the diagnosis.  I emailed Tiff to make her aware of the referral and I emailed Bernie at [facility] to make her aware that Tiff or one of her staff members would be getting this pt scheduled.

## 2013-07-25 ENCOUNTER — Ambulatory Visit (INDEPENDENT_AMBULATORY_CARE_PROVIDER_SITE_OTHER): Payer: Medicare Other

## 2013-07-25 VITALS — Ht 66.5 in | Wt 137.4 lb

## 2013-07-25 DIAGNOSIS — Z4802 Encounter for removal of sutures: Secondary | ICD-10-CM

## 2013-07-25 NOTE — Patient Instructions (Signed)
You are doing very well.  Continue what you are doing.  Continue  showering and keep your incision clean and dry.  Call us with any concerns of redness, swelling, drainage and bowels not moving.  Follow up with Dr Zella Richer as scheduled.

## 2013-07-25 NOTE — Progress Notes (Signed)
Pt comes in for staple removal s/p lap assisted right colectomy.  She is doing well.  Her appetite is back to normal.  She is moving her bowels.  She has some gas pains in her abdomen but she does pass the gas.  She is up walking around during the day.  Her incision looks great.  I removed the staples and applied steri strips.  She has been showering and I instructed her to continue.  She has a follow up with Dr Zella Richer.

## 2013-07-28 ENCOUNTER — Telehealth: Payer: Self-pay | Admitting: *Deleted

## 2013-07-28 NOTE — Telephone Encounter (Signed)
Confirmed appointment with Dr, Benay Spice for 08/15/13.  Contact names, phone numbers, and directions were provided.

## 2013-07-29 NOTE — Discharge Summary (Signed)
Physician Discharge Summary  Patient ID: Leslie Rivera MRN: [id number] DOB/AGE: [birth year] 68 y.o.  Admit date: 07/17/2013 Discharge date: 07/21/2013  Admission Diagnoses:  Right colon cancer  Discharge Diagnoses:  Principal Problem:   Cancer of right colon (T3N0M0) s/p partial colectomy 07/17/13    Discharged Condition: good  Hospital Course: She underwent a laparoscopic-assisted right colectomy and tolerated this well.  Her postoperative pain became under better control by her second postoperative day. She was started on a diet which was slowly advanced. Her wound was healing well. She had return of bowel function. On postop day #4 she was able to be discharged. Specific instructions were given to her. Her pathology was discussed with her.  Consults: None  Significant Diagnostic Studies: None  Treatments: surgery: Laparoscopic-assisted right colectomy  Discharge Exam: Blood pressure 109/72, pulse 73, temperature 97.7 F (36.5 C), temperature source Oral, resp. rate 18, height 5' 6.5" (1.689 m), weight 142 lb 8 oz (64.638 kg), SpO2 95.00%.   Disposition: 01-Home or Self Care   Future Appointments Provider Department Dept Phone   08/06/2013 10:50 AM Odis Hollingshead, MD [facility], [place] [phone]   08/15/2013 1:30 PM [facility] [phone]   08/15/2013 2:00 PM Ladell Pier, MD [facility] [phone]   09/09/2013 7:40 AM [facility] [place]  [facility]AG[facility] [phone]   Please wear two piece clothing and wear no powder or deodorant. Please arrive 15 minutes early prior to your appointment time.       Medication List         calcium carbonate 600 MG Tabs tablet  Commonly known as:  OS-CAL  Take 600 mg by mouth daily with breakfast.     diazepam 5 MG tablet  Commonly known as:  VALIUM  Take 5 mg by mouth every 8 (eight) hours as needed for  anxiety.     Fish Oil 1000 MG Caps  Take 1 capsule by mouth daily.     levothyroxine 50 MCG tablet  Commonly known as:  SYNTHROID, LEVOTHROID  Take 50-75 mcg by mouth daily. 1.5 tablets for 2 days then 1 tablet and repeats cycle.     ondansetron 4 MG tablet  Commonly known as:  ZOFRAN  Take 1 tablet (4 mg total) by mouth every 4 (four) hours.     OVER THE COUNTER MEDICATION  Take 1 tablet by mouth daily.     oxyCODONE-acetaminophen 5-325 MG per tablet  Commonly known as:  PERCOCET/ROXICET  Take 1-2 tablets by mouth every 4 (four) hours as needed for moderate pain.     PROBIOTIC DAILY PO  Take 1 tablet by mouth daily.     zolpidem 10 MG tablet  Commonly known as:  AMBIEN  Take 10 mg by mouth at bedtime as needed for sleep.         Signed: Odis Hollingshead 07/29/2013, 12:31 PM

## 2013-08-06 ENCOUNTER — Ambulatory Visit (INDEPENDENT_AMBULATORY_CARE_PROVIDER_SITE_OTHER): Payer: Medicare Other | Admitting: General Surgery

## 2013-08-06 VITALS — BP 122/78 | HR 80 | Temp 98.0°F | Resp 18 | Ht 66.5 in | Wt 136.0 lb

## 2013-08-06 DIAGNOSIS — Z4889 Encounter for other specified surgical aftercare: Secondary | ICD-10-CM

## 2013-08-06 NOTE — Patient Instructions (Signed)
Continue light activities. Avoid foods that cause a lot of gas or constipation.

## 2013-08-06 NOTE — Progress Notes (Signed)
Procedure:  Laparoscopic-assisted right colectomy  Date:  07/17/2013  Pathology:  T3N0M0 Adenocarcinoma of the right colon  History:  She is here for a postoperative visit. She is in need well. Her bowels are somewhat irregular. Her pain is improving. Her endurance is slowly improving.  Exam: General- Is in NAD. Abdomen-soft, incisions are clean, intact, and solid.  Assessment:  Status post laparoscopic-assisted right colectomy for right colon cancer-she is making satisfactory postoperative progress. She has an opponent with oncology later this month.  Plan:  Continue light activities. Return visit in 3-4 weeks.

## 2013-08-15 ENCOUNTER — Encounter: Payer: Self-pay | Admitting: Oncology

## 2013-08-15 ENCOUNTER — Encounter: Payer: Self-pay | Admitting: *Deleted

## 2013-08-15 ENCOUNTER — Ambulatory Visit (HOSPITAL_BASED_OUTPATIENT_CLINIC_OR_DEPARTMENT_OTHER): Payer: Medicare Other | Admitting: Oncology

## 2013-08-15 ENCOUNTER — Ambulatory Visit: Payer: Medicare Other

## 2013-08-15 VITALS — BP 110/68 | HR 60 | Temp 97.5°F | Resp 18 | Ht 66.0 in | Wt 137.1 lb

## 2013-08-15 DIAGNOSIS — C182 Malignant neoplasm of ascending colon: Secondary | ICD-10-CM

## 2013-08-15 NOTE — Progress Notes (Signed)
[facility] Patient Consult   Referring MD: Bobi Daudelin Spindler 68 y.o.  August 06, 1945    Reason for Referral: Colon cancer     HPI: Leslie Rivera has a history of colon polyps. In June of 2013 she underwent a colonoscopy and multiple polyps were removed. The pathology revealed  sessile serrated adenomas. She was taken to a followup colonoscopy on 06/03/2013. A nonobstructing mass was found in the proximal ascending colon. A mass was circumferential and was biopsied. Diverticula were noted in the sigmoid colon. An upper endoscopy on the same day found the esophagus, stomach, and duodenum to be normal.  The pathology revealed mild duodenal intraepithelial lymphocytosis with superimposed changes of peptic duodenopathy. The a sending colon mass biopsy revealed poorly differentiated invasive adenocarcinoma. 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 A BRAF mutation was not detected.  She was referred to Dr. Zella Richer. A CT of the abdomen and pelvis on 06/04/2013 revealed wall thickening of a segment of the right colon with subtle stranding in the adjacent fat. A few mildly prominent lymph nodes along the ileocolic mesenteric chain with the largest measuring 7 mm. No pathologically enlarged lymph node. No lung nodules or liver lesions.  She was taken the operating room for a laparoscopic assisted right colectomy and wedge resection of a liver nodule on 07/17/2013. The tumor was noted at the a sending colon. A small white nodules noted on the anterior surface of the left liver. No peritoneal nodules. No pelvic nodules. A side-to-side distal ileum and mid transverse colon anastomosis was created. A laparoscopic wedge resection of the left anterior liver nodule was performed. The pathology ([id number]) revealed a benign subcapsular cyst in the liver biopsy. The colon mass returned as invasive adenocarcinoma, poorly  differentiated, 5 cm, with tumor extending into the subserosal connective tissue. The proximal, distal, and circumferential margins were negative. Lymphovascular and perineural invasion were not identified. No macroscopic tumor perforation the no additional polyps. 20 lymph nodes were negative for metastatic carcinoma.  She reports an uneventful operative recovery. She feels well. She felt well prior to surgery. Past Medical History  Diagnosis Date  . Irregular heart beat     occ. PVC's  . Hypothyroidism   . Anxiety   . History of migraine headaches   . Adenomatous colon polyps  June 2013   . Internal hemorrhoids   . Anemia  November 2014     Iron deficiency anemia  .  colon cancer, a sending, stage II (T3 N0)   07/17/2013   .  G0 P0          Past Surgical History  Procedure Laterality Date  . Breast surgery       benign right breast biopsy   . Tonsillectomy      teenager- age 4  . Laparoscopic right colectomy Right 07/17/2013    Procedure: LAPAROSCOPIC ASSISTED  RIGHT COLECTOMY;  Surgeon: Odis Hollingshead, MD;  Location: [facility];  Service: General;  Laterality: Right;    Family History  Problem Relation Age of Onset  . Cancer Brother  28      metastatic melanoma   . Cancer Maternal Grandmother  87s     colon   .    Head and neck cancer                     paternal grandmother  .    Breast cancer  maternal first cousin                                                  90s One sister, no other family history of cancer  Current outpatient prescriptions:calcium carbonate (OS-CAL) 600 MG TABS tablet, Take 600 mg by mouth daily with breakfast. , Disp: , Rfl: ;  diazepam (VALIUM) 5 MG tablet, Take 5 mg by mouth every 8 (eight) hours as needed for anxiety. , Disp: , Rfl: ;  levothyroxine (SYNTHROID, LEVOTHROID) 50 MCG tablet, Take 50-75 mcg by mouth daily. 1.5 tablets for 2 days then 1 tablet and repeats cycle., Disp: , Rfl:  Omega-3 Fatty Acids (FISH  OIL) 1000 MG CAPS, Take 1 capsule by mouth daily. , Disp: , Rfl: ;  ondansetron (ZOFRAN) 4 MG tablet, Take 1 tablet (4 mg total) by mouth every 4 (four) hours., Disp: 10 tablet, Rfl: 0;  OVER THE COUNTER MEDICATION, Take 1 tablet by mouth daily. , Disp: , Rfl:  oxyCODONE-acetaminophen (PERCOCET/ROXICET) 5-325 MG per tablet, Take 1-2 tablets by mouth every 4 (four) hours as needed for moderate pain., Disp: 40 tablet, Rfl: 0;  Probiotic Product (PROBIOTIC DAILY PO), Take 1 tablet by mouth daily. , Disp: , Rfl: ;  zolpidem (AMBIEN) 10 MG tablet, Take 10 mg by mouth at bedtime as needed for sleep. , Disp: , Rfl:   Allergies:  Allergies  Allergen Reactions  . Hydrocodone Nausea And Vomiting    Can tolerate Oxycodone  . Erythromycin     GI upset    Social History: She lives alone in [place]. She works in a Air traffic controller business. She does not use tobacco. She has one liquor drink each night. No transfusion history. No risk factor for HIV or hepatitis.    History  Alcohol Use  . Yes    Comment: 2 ounces daily    History  Smoking status  . Former Smoker  . Quit date: 06/11/1997  Smokeless tobacco  . Never Used     ROS:   Positives include: None  A complete ROS was otherwise negative.  Physical Exam:  Blood pressure 110/68, pulse 60, temperature 97.5 F (36.4 C), temperature source Oral, resp. rate 18, height _0  (1.676 m), weight 137 lb 1.6 oz (62.188 kg), SpO2 98.00%.  HEENT: Oropharynx without visible mass, neck without mass Lungs: Clear bilaterally Cardiac: Regular rate and rhythm Abdomen: No hepatosplenomegaly, nontender, no mass, healed surgical incisions  Vascular: No leg edema Lymph nodes: No cervical, supraclavicular, axillary, or inguinal nodes Neurologic: Alert and oriented, the motor exam appears intact in the upper and lower extremities Skin: No rash Musculoskeletal: No spine tenderness   LAB:  CBC  Lab Results  Component Value Date   WBC 7.1  07/19/2013   HGB 8.5* 07/19/2013   HCT 28.4* 07/19/2013   MCV 81.4 07/19/2013   PLT 262 07/19/2013   NEUTROABS 2.4 07/10/2013    05/12/2013-hemoglobin 9.6, MCV 76.2 CMP      Component Value Date/Time   NA 138 07/18/2013 0554   K 4.5 07/18/2013 0554   CL 105 07/18/2013 0554   CO2 22 07/18/2013 0554   GLUCOSE 164* 07/18/2013 0554   BUN 10 07/18/2013 0554   CREATININE 0.69 07/18/2013 0554   CALCIUM 8.4 07/18/2013 0554   PROT 7.3 07/10/2013 0900   ALBUMIN 3.7 07/10/2013 0900  AST 21 07/10/2013 0900   ALT 11 07/10/2013 0900   ALKPHOS 81 07/10/2013 0900   BILITOT 0.4 07/10/2013 0900   GFRNONAA 89* 07/18/2013 0554   GFRAA >90 07/18/2013 0554   05/15/2013-ferritin 6 Preoperative CEA-0.9, verbal report from Dr. Lorie Apley office Radiology: As per history of present illness    Assessment/Plan:   1. 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 History of colon polyps  3. Family history of colon cancer, breast cancer, and melanoma  4. Iron deficiency anemia-taking iron, she will ask Dr.Pang to check a hemoglobin within the next one to 2 months  5. 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 I discussed the prognosis and reviewed the details of the surgical pathology report with her today. She has a good prognosis for a long-term disease-free survival. We discussed the indication for adjuvant systemic chemotherapy. I described the benefit associated with adjuvant 5-fluorouracil based chemotherapy. The absolute expected benefit in her case is small. She indicated that she does not wish to consider adjuvant chemotherapy.   The loss of MLH1 and PMS2 expression is most likely related to a promoter hypermetylation event. However the tumor is BRAF negative and she has  a family history of colon cancer. She has a personal history of multiple polyps and developed the right-sided colon cancer approximately 1-1/2 years after a colonoscopy. We will proceed with additional testing to rule out a diagnosis of hereditary non-polyposis colon cancer syndrome. We will either proceed with hypermethylation testing or mutation testing of the MLH1 and PMS2 genes.  We discussed exercise and dietary maneuvers that can decrease the risk of developing colon cancer. She understands the need to followup closely with Dr. Collene Mares for surveillance colonoscopies.  She plans to continue clinical followup with Dr. Minna Antis. I recommend he check a CEA every 6 months for the next 3 years and then yearly up to 5 years from diagnosis.  Leslie Rivera is not scheduled for a followup appointment at the [facility]. We will be glad to see her in the future as needed. Her case will be presented at the GI tumor conference on 08/20/2013.  Approximately 50 minutes were spent with the patient today. The majority of the time was used for counseling and coordination of care. [place], [place] 08/15/2013, 2:40 PM

## 2013-08-15 NOTE — Progress Notes (Signed)
Checked in new pt with no financial concerns. °

## 2013-08-15 NOTE — [facility] Oncology Navigator Note (Unsigned)
Met with Leslie Rivera and family. Explained role of nurse navigator. Educational information provided on colorectal cancer  She will not be receiving treatment.  She understands to exercise and eat a healthy, low fat diet.  She understands she needs to have a colonoscopy 1 year post-op. [place] resources provided to patient, including support group information.  Contact names and phone numbers were provided for entire [facility] team.  Teach back method was used.  No barriers to care identified.  Further genetic tests to be ordered per MD and she has requested to check on status of her insurance coverage.  This RN spoke with K. Sharlett Iles in pathology and this will be checked once the tests are ordered per MD.

## 2013-08-19 ENCOUNTER — Telehealth: Payer: Self-pay | Admitting: *Deleted

## 2013-08-19 NOTE — Telephone Encounter (Signed)
This RN was notified by K. Sharlett Iles in pathology re: testing on pathology request per Dr. Benay Spice for Hypermethylation testing and MLH-1 testing.  The patient wanted to make sure her insurance would cover these tests.  Pathology stated that patient will have to call her insurance company and see if the CPT codes:  [phone] and [phone] are covered under her policy.  This RN left a message on patient's voice mail to contact this RN.

## 2013-08-19 NOTE — Telephone Encounter (Signed)
Spoke with patient and gave her the two CPT codes to call and verify coverage with her insurance.  She was appreciative of the call, stated she would contact her insurance co. and will call me back to say whether or not to proceed with further testing on pathology.

## 2013-08-27 ENCOUNTER — Ambulatory Visit (INDEPENDENT_AMBULATORY_CARE_PROVIDER_SITE_OTHER): Payer: Medicare Other | Admitting: General Surgery

## 2013-08-27 ENCOUNTER — Encounter (INDEPENDENT_AMBULATORY_CARE_PROVIDER_SITE_OTHER): Payer: Self-pay | Admitting: General Surgery

## 2013-08-27 VITALS — BP 114/70 | HR 64 | Temp 98.2°F | Resp 14 | Ht 66.5 in | Wt 140.0 lb

## 2013-08-27 DIAGNOSIS — Z4889 Encounter for other specified surgical aftercare: Secondary | ICD-10-CM

## 2013-08-27 NOTE — Progress Notes (Signed)
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  She is feeling much better.  She is back at work.  She saw Dr. Benay Spice and no further treatment is recommended.  She is going to have clinical followup with Dr. Minna Antis for this. Dr. Malachy Mood has made recommendations to him. Exam: General- Is in NAD. Abdomen-soft, incisions are clean, intact, and solid.  Assessment:  Status post laparoscopic-assisted right colectomy for right colon cancer-She is recovering well.  Plan:  Resume normal activities as tolerated. Followup when necessary.

## 2013-08-27 NOTE — Patient Instructions (Signed)
May resume normal activities as tolerated, as discussed. Call for any questions or problems.

## 2013-09-08 ENCOUNTER — Telehealth: Payer: Self-pay | Admitting: *Deleted

## 2013-09-08 NOTE — Telephone Encounter (Signed)
Spoke with patient by phone.  She said she contacted her insurance company and was told the hospital should be able to tell her how much the additional testing to rule out a diagnosis of hereditary non-polyposis colon cancer syndrome will cost.  This RN spoke with K. Sharlett Iles in pathology who stated the testing is done at [facility] clinic.  She will contact [facility] to either receive a quote or to receive a point contact number for the patient to call.  Patient aware and expressed appreciation for the assistance.  She said she is leaning towards not agreeing on the additional testing, but that she will wait for a quote to make final decision.

## 2013-09-08 NOTE — Telephone Encounter (Signed)
Leslie Rivera from [facility] managed care to contact [facility] and will contact the patient.

## 2013-09-09 ENCOUNTER — Ambulatory Visit
Admission: RE | Admit: 2013-09-09 | Discharge: 2013-09-09 | Disposition: A | Discharge status: Home / Self Care | Payer: Medicare Other | Source: Ambulatory Visit

## 2013-09-09 DIAGNOSIS — Z1231 Encounter for screening mammogram for malignant neoplasm of breast: Secondary | ICD-10-CM

## 2013-09-12 ENCOUNTER — Other Ambulatory Visit (HOSPITAL_COMMUNITY)
Admission: RE | Admit: 2013-09-12 | Discharge: 2013-09-12 | Disposition: A | Discharge status: Home / Self Care | Payer: Medicare Other | Source: Ambulatory Visit | Attending: Oncology | Admitting: Oncology

## 2013-09-12 DIAGNOSIS — C182 Malignant neoplasm of ascending colon: Secondary | ICD-10-CM | POA: Insufficient documentation

## 2013-09-29 ENCOUNTER — Encounter (HOSPITAL_COMMUNITY): Payer: Self-pay

## 2014-10-05 ENCOUNTER — Other Ambulatory Visit: Payer: Self-pay

## 2014-10-05 DIAGNOSIS — Z1231 Encounter for screening mammogram for malignant neoplasm of breast: Secondary | ICD-10-CM

## 2014-10-20 ENCOUNTER — Ambulatory Visit: Payer: Self-pay

## 2015-04-21 ENCOUNTER — Ambulatory Visit
Admission: RE | Admit: 2015-04-21 | Discharge: 2015-04-21 | Disposition: A | Discharge status: Home / Self Care | Payer: Medicare Other | Source: Ambulatory Visit

## 2015-04-21 DIAGNOSIS — Z1231 Encounter for screening mammogram for malignant neoplasm of breast: Secondary | ICD-10-CM

## 2015-05-05 ENCOUNTER — Encounter: Payer: Self-pay | Admitting: Physician Assistant

## 2015-05-05 ENCOUNTER — Ambulatory Visit (INDEPENDENT_AMBULATORY_CARE_PROVIDER_SITE_OTHER): Payer: Medicare Other | Admitting: Physician Assistant

## 2015-05-05 VITALS — BP 114/76 | HR 76 | Temp 98.3°F | Resp 16 | Ht 65.5 in | Wt 139.4 lb

## 2015-05-05 DIAGNOSIS — J069 Acute upper respiratory infection, unspecified: Secondary | ICD-10-CM

## 2015-05-05 DIAGNOSIS — B9789 Other viral agents as the cause of diseases classified elsewhere: Principal | ICD-10-CM

## 2015-05-05 MED ORDER — PHENYLEPH-PROMETHAZINE-COD 5-6.25-10 MG/5ML PO SYRP: 5.0000 mL | ORAL_SOLUTION | Freq: Four times a day (QID) | ORAL | Status: DC | PRN

## 2015-05-05 NOTE — Progress Notes (Signed)
   Subjective:    Patient ID: Leslie Rivera, female    DOB: Jun 09, 1946, 69 y.o.   MRN: [id number]  Chief Complaint  Patient presents with  . Cough    x 4 days  . Sore Throat   HPI Patient presents today for evaluation of cough and sore throat x 4 days.   Started with a sore throat 5 days ago, which has gradually improved over the last couple of days. Now having persistent, occasionally productive cough with clear mucous and post-nasal drip. Associated with rhinorrhea and headaches, but no fevers, chills, vomiting, or diarrhea.   Has tried OTC allegra and zinc lozenges x 5 days without any relief. The lozenges gave her diarrhea, so she stopped them.   She is still going to work and continuing with her regular routine, but today she states "has been the worst of all".   No other concerns on today's visit.   Review of Systems  Constitutional: Negative for fever, chills, activity change, appetite change and unexpected weight change.  HENT: Positive for congestion, postnasal drip, rhinorrhea and sore throat. Negative for ear discharge, ear pain, hearing loss and tinnitus.   Respiratory: Positive for cough (occasionally productive with clear mucous). Negative for chest tightness and shortness of breath.   Cardiovascular: Negative for chest pain and palpitations.  Gastrointestinal: Positive for diarrhea (zinc lozenges). Negative for nausea, vomiting and abdominal pain.  Musculoskeletal: Negative for myalgias and back pain.  Neurological: Positive for headaches.       Objective:   Physical Exam  Constitutional: She is oriented to person, place, and time. She appears well-developed and well-nourished. No distress.  BP 114/76 mmHg  Pulse 76  Temp(Src) 98.3 F (36.8 C) (Oral)  Resp 16  Ht 5' 5.5" (1.664 m)  Wt 139 lb 6.4 oz (63.231 kg)  BMI 22.84 kg/m2  HENT:  Head: Normocephalic and atraumatic.  Right Ear: External ear normal.  Left Ear: External ear normal.  Mouth/Throat:  Oropharynx is clear and moist. No oropharyngeal exudate.  Nasal turbinates mildly pale b/l.   Eyes: EOM are normal. No scleral icterus.  Neck: Neck supple.  Cardiovascular: Normal rate, regular rhythm, normal heart sounds and intact distal pulses.  Exam reveals no gallop and no friction rub.   No murmur heard. Pulmonary/Chest: Effort normal and breath sounds normal. No respiratory distress. She has no wheezes. She has no rales.  Lymphadenopathy:    She has no cervical adenopathy.  Neurological: She is alert and oriented to person, place, and time.  Skin: Skin is warm and dry. She is not diaphoretic.  Psychiatric: She has a normal mood and affect. Her behavior is normal. Judgment and thought content normal.      Assessment & Plan:  1. Viral URI with cough - Emphasized importance of rest, plenty of fluids, and cough syrup for the next several days as she should expect to turn a corner in approx. 48 hours. If symptoms persist or do not improve after 48 hours, return to clinic for re-evaluation.  - Phenyleph-Promethazine-Cod 5-6.25-10 MG/5ML SYRP; Take 5 mLs by mouth every 6 (six) hours as needed.  Dispense: 180 mL; Refill: 0

## 2015-05-05 NOTE — Patient Instructions (Signed)
Rest. Drink lots of water. Rest more.

## 2015-05-05 NOTE — Progress Notes (Signed)
Subjective:   Patient ID: Leslie Rivera, female     DOB: 1946-03-26, 69 y.o.    MRN: [id number]  PCP: Tommy Medal, MD  Chief Complaint  Patient presents with  . Cough    non-produective   . Sore Throat    all symptom x 4 days    HPI  Presents for evaluation of cough and sore throat x 4 days.   Started with a sore throat 5 days ago, which has gradually improved over the last couple of days. Now having persistent, occasionally productive cough with clear mucous and post-nasal drip. Associated with rhinorrhea and headaches, but no fevers, chills, vomiting, or diarrhea.   Has tried OTC allegra and zinc lozenges x 5 days without any relief. The lozenges gave her diarrhea, so she stopped them.  She is still going to work and continuing with her regular routine, but today she states "has been the worst of all".    Prior to Admission medications   Medication Sig Start Date End Date Taking? Authorizing Provider  calcium carbonate (OS-CAL) 600 MG TABS tablet Take 600 mg by mouth daily with breakfast.    Yes Historical Provider, MD  diazepam (VALIUM) 5 MG tablet Take 5 mg by mouth every 8 (eight) hours as needed for anxiety.  03/26/13  Yes Historical Provider, MD  levothyroxine (SYNTHROID, LEVOTHROID) 75 MCG tablet Take 75 mcg by mouth daily. 04/27/15  Yes Historical Provider, MD  OVER THE COUNTER MEDICATION Take 1 tablet by mouth daily.    Yes Historical Provider, MD  Probiotic Product (PROBIOTIC DAILY PO) Take 1 tablet by mouth daily.    Yes Historical Provider, MD  zolpidem (AMBIEN) 10 MG tablet Take 10 mg by mouth at bedtime as needed for sleep.  05/21/13  Yes Historical Provider, MD  Omega-3 Fatty Acids (FISH OIL) 1000 MG CAPS Take 1 capsule by mouth daily.     Historical Provider, MD     Allergies  Allergen Reactions  . Hydrocodone Nausea And Vomiting    Can tolerate Oxycodone  . Erythromycin     GI upset     Patient Active Problem List   Diagnosis Date Noted  . Colon  cancer ([place]) 07/17/2013  . Cancer of ascending colon s/p partial colectomy 07/17/13 06/11/2013     Family History  Problem Relation Age of Onset  . Cancer Brother     internal melanoma  . Cancer Maternal Grandmother     colon     Social History   Social History  . Marital Status: Divorced    Spouse Name: N/A  . Number of Children: N/A  . Years of Education: N/A   Occupational History  . owner of [employer]    Social History Main Topics  . Smoking status: Former Smoker    Quit date: 06/11/1997  . Smokeless tobacco: Never Used  . Alcohol Use: 0.0 oz/week    0 Standard drinks or equivalent per week     Comment: 2 ounces daily - Scotch  . Drug Use: No  . Sexual Activity: Not Currently   Other Topics Concern  . Not on file   Social History Narrative        Review of Systems Constitutional: Negative for fever, chills, activity change, appetite change and unexpected weight change.  HENT: Positive for congestion, postnasal drip, rhinorrhea and sore throat. Negative for ear discharge, ear pain, hearing loss and tinnitus.  Respiratory: Positive for cough (occasionally productive with clear mucous). Negative for chest  tightness and shortness of breath.  Cardiovascular: Negative for chest pain and palpitations.  Gastrointestinal: Positive for diarrhea (zinc lozenges). Negative for nausea, vomiting and abdominal pain.  Musculoskeletal: Negative for myalgias and back pain.  Neurological: Positive for headaches.        Objective:  Physical Exam  Constitutional: She is oriented to person, place, and time. Vital signs are normal. She appears well-developed and well-nourished. She is active and cooperative. No distress.  BP 114/76 mmHg  Pulse 76  Temp(Src) 98.3 F (36.8 C) (Oral)  Resp 16  Ht 5' 5.5" (1.664 m)  Wt 139 lb 6.4 oz (63.231 kg)  BMI 22.84 kg/m2  SpO2 98%  HENT:  Head: Normocephalic and atraumatic.  Right Ear: Hearing, tympanic membrane,  external ear and ear canal normal.  Left Ear: Hearing, tympanic membrane, external ear and ear canal normal.  Nose: Nose normal.  Mouth/Throat: Oropharynx is clear and moist and mucous membranes are normal.  Eyes: Conjunctivae are normal. No scleral icterus.  Neck: Normal range of motion. Neck supple. No thyromegaly present.  Cardiovascular: Normal rate, regular rhythm and normal heart sounds.   Pulses:      Radial pulses are 2+ on the right side, and 2+ on the left side.  Pulmonary/Chest: Effort normal and breath sounds normal.  Lymphadenopathy:       Head (right side): No tonsillar, no preauricular, no posterior auricular and no occipital adenopathy present.       Head (left side): No tonsillar, no preauricular, no posterior auricular and no occipital adenopathy present.    She has no cervical adenopathy.       Right: No supraclavicular adenopathy present.       Left: No supraclavicular adenopathy present.  Neurological: She is alert and oriented to person, place, and time. No sensory deficit.  Skin: Skin is warm, dry and intact. No rash noted. No cyanosis or erythema. Nails show no clubbing.  Psychiatric: She has a normal mood and affect. Her behavior is normal.             Assessment & Plan:  1. Viral URI with cough Supportive care. Anticipatory guidance. RTC if symptoms persist. - Phenyleph-Promethazine-Cod 5-6.25-10 MG/5ML SYRP; Take 5 mLs by mouth every 6 (six) hours as needed.  Dispense: 180 mL; Refill: 0   Fara Chute, PA-C Physician Assistant-Certified [facility]

## 2015-07-12 ENCOUNTER — Other Ambulatory Visit: Payer: Self-pay | Admitting: Physician Assistant

## 2015-07-28 DIAGNOSIS — E039 Hypothyroidism, unspecified: Secondary | ICD-10-CM | POA: Diagnosis not present

## 2015-07-28 DIAGNOSIS — E7800 Pure hypercholesterolemia, unspecified: Secondary | ICD-10-CM | POA: Diagnosis not present

## 2015-07-28 DIAGNOSIS — R0989 Other specified symptoms and signs involving the circulatory and respiratory systems: Secondary | ICD-10-CM | POA: Diagnosis not present

## 2015-07-28 DIAGNOSIS — F411 Generalized anxiety disorder: Secondary | ICD-10-CM | POA: Diagnosis not present

## 2015-07-28 DIAGNOSIS — Z780 Asymptomatic menopausal state: Secondary | ICD-10-CM | POA: Diagnosis not present

## 2015-09-03 DIAGNOSIS — F4320 Adjustment disorder, unspecified: Secondary | ICD-10-CM | POA: Diagnosis not present

## 2015-10-20 DIAGNOSIS — F39 Unspecified mood [affective] disorder: Secondary | ICD-10-CM | POA: Diagnosis not present

## 2016-03-14 ENCOUNTER — Ambulatory Visit (INDEPENDENT_AMBULATORY_CARE_PROVIDER_SITE_OTHER): Payer: Medicare Other | Admitting: Urgent Care

## 2016-03-14 VITALS — BP 122/72 | HR 68 | Temp 98.5°F | Resp 17 | Ht 65.5 in | Wt 142.0 lb

## 2016-03-14 DIAGNOSIS — R42 Dizziness and giddiness: Secondary | ICD-10-CM

## 2016-03-14 DIAGNOSIS — I959 Hypotension, unspecified: Secondary | ICD-10-CM | POA: Diagnosis not present

## 2016-03-14 DIAGNOSIS — M25551 Pain in right hip: Secondary | ICD-10-CM

## 2016-03-14 DIAGNOSIS — M25552 Pain in left hip: Secondary | ICD-10-CM | POA: Diagnosis not present

## 2016-03-14 DIAGNOSIS — Z79899 Other long term (current) drug therapy: Secondary | ICD-10-CM | POA: Diagnosis not present

## 2016-03-14 LAB — POCT CBC
Granulocyte percent: 62.4 %G (ref 37–80)
HCT, POC: 44.6 % (ref 37.7–47.9)
Hemoglobin: 15.5 g/dL (ref 12.2–16.2)
Lymph, poc: 2.3 (ref 0.6–3.4)
MCH, POC: 32.5 pg — AB (ref 27–31.2)
MCHC: 34.9 g/dL (ref 31.8–35.4)
MCV: 93.1 fL (ref 80–97)
MID (CBC): 0.2 (ref 0–0.9)
MPV: 7.6 fL (ref 0–99.8)
PLATELET COUNT, POC: 257 10*3/uL (ref 142–424)
POC Granulocyte: 4.1 (ref 2–6.9)
POC LYMPH %: 34.2 % (ref 10–50)
POC MID %: 3.4 %M (ref 0–12)
RBC: 4.79 M/uL (ref 4.04–5.48)
RDW, POC: 12.9 %
WBC: 6.6 10*3/uL (ref 4.6–10.2)

## 2016-03-14 LAB — GLUCOSE, POCT (MANUAL RESULT ENTRY): POC Glucose: 81 mg/dl (ref 70–99)

## 2016-03-14 MED ORDER — TRAMADOL HCL 50 MG PO TABS: 50.0000 mg | ORAL_TABLET | Freq: Three times a day (TID) | ORAL | 0 refills | Status: DC | PRN

## 2016-03-14 NOTE — Progress Notes (Signed)
MRN: [id number] DOB: 04-24-1946  Subjective:   Leslie Rivera is a 70 y.o. female presenting for chief complaint of Hypotension (Onset 3-4 days); Gastroesophageal Reflux; and cold sweats  Hypotension - Reports occasional cold sweat, dizziness that can last up to 2 days. Episodes happen once every few months. Patient has checked her BP during these episodes and has found it to be in the 80's. Denies fever, chest pain, shob, heart racing, palpitations during these episodes. Also denies n/v, abdominal pain, diarrhea, headache. Denies smoking cigarettes. Drinks 1-2 alcohol beverages per day. Patient tries to eat healthily but generally has just 2 meals per day. Hydrates very well. However, patient has a history of colon resection, admits that she can easily have upset stomach with food she eats. Also admits a history of PVCs. Has not been seen by cardiology recently.  Hip Pain - Patient reports bilateral hip pain for several months. Her pain has worsened significantly in the past 4-5 days. She takes APAP and ibuprofen daily for this with some relief. She has never previously been seen for this.   Leslie Rivera has a current medication list which includes the following prescription(s): calcium carbonate, diazepam, levothyroxine, fish oil, OVER THE COUNTER MEDICATION, and probiotic product. Also is allergic to hydrocodone and erythromycin.  Leslie Rivera  has a past medical history of Abdominal pain; Adenomatous colon polyp; Anemia; Anxiety; Cancer ([place]); History of migraine headaches; Hypothyroidism; Internal hemorrhoids; Irregular heart beat; and Periumbilical pain. Also  has a past surgical history that includes Breast surgery; Tonsillectomy; and Laparoscopic right colectomy (Right, 07/17/2013).  Objective:   Vitals: BP 122/72 (BP Location: Right Arm, Patient Position: Sitting, Cuff Size: Normal)   Pulse 68   Temp 98.5 F (36.9 C) (Oral)   Resp 17   Ht 5' 5.5" (1.664 m)   Wt 142 lb (64.4 kg)   SpO2 99%    BMI 23.27 kg/m   Physical Exam  Constitutional: She is oriented to person, place, and time. She appears well-developed and well-nourished.  HENT:  Mouth/Throat: Oropharynx is clear and moist.  Eyes: Pupils are equal, round, and reactive to light.  Neck: Normal range of motion. Neck supple.  Cardiovascular: Normal rate, regular rhythm and intact distal pulses.  Exam reveals no gallop and no friction rub.   No murmur heard. Pulmonary/Chest: No respiratory distress. She has no wheezes. She has no rales.  Abdominal: Soft. Bowel sounds are normal. She exhibits no distension and no mass. There is no tenderness. There is no guarding.  Lymphadenopathy:    She has no cervical adenopathy.  Neurological: She is alert and oriented to person, place, and time.  Skin: Skin is warm and dry.   ECG interpretation - normal sinus rhythm.   Results for orders placed or performed in visit on 03/14/16 (from the past 24 hour(s))  POCT CBC     Status: Abnormal   Collection Time: 03/14/16  1:56 PM  Result Value Ref Range   WBC 6.6 4.6 - 10.2 K/uL   Lymph, poc 2.3 0.6 - 3.4   POC LYMPH PERCENT 34.2 10 - 50 %L   MID (cbc) 0.2 0 - 0.9   POC MID % 3.4 0 - 12 %M   POC Granulocyte 4.1 2 - 6.9   Granulocyte percent 62.4 37 - 80 %G   RBC 4.79 4.04 - 5.48 M/uL   Hemoglobin 15.5 12.2 - 16.2 g/dL   HCT, POC 44.6 37.7 - 47.9 %   MCV 93.1 80 - 97 fL  MCH, POC 32.5 (A) 27 - 31.2 pg   MCHC 34.9 31.8 - 35.4 g/dL   RDW, POC 12.9 %   Platelet Count, POC 257 142 - 424 K/uL   MPV 7.6 0 - 99.8 fL  POCT glucose (manual entry)     Status: None   Collection Time: 03/14/16  1:59 PM  Result Value Ref Range   POC Glucose 81 70 - 99 mg/dl   Assessment and Plan :   1. Hypotension, unspecified hypotension type 2. Dizziness - I suspect patient may become hypoglycemic during these particular episodes. I advised that she continue hydrating well, eat more regular meals. Carry crackers or small snacks for these particular  episodes. Consider referral to cardiology for her history of PVCs to rule this out as a source of her symptoms.  3. Hip pain, bilateral - Patient had to leave for a trip today. I recommended we have patient follow up to perform x-rays. In the meantime, I advised she schedule APAP and use tramadol for breakthrough pain.  Jaynee Eagles, PA-C [facility] [phone] 03/14/2016 12:56 PM

## 2016-03-14 NOTE — Patient Instructions (Signed)
Tylenol You may take 500mg  every 6 hours or 750mg  every 8 hours for pain and inflammation.   Use Tramadol only for breakthrough pain.   Hypotension As your heart beats, it forces blood through your arteries. This force is your blood pressure. If your blood pressure is too low for you to go about your normal activities or to support the organs of your body, you have hypotension. Hypotension is also referred to as low blood pressure. When your blood pressure becomes too low, you may not get enough blood to your brain. As a result, you may feel weak, feel lightheaded, or develop a rapid heart rate. In a more severe case, you may faint. CAUSES Various conditions can cause hypotension. These include:  Blood loss.  Dehydration.  Heart or endocrine problems.  Pregnancy.  Severe infection.  Not having a well-balanced diet filled with needed nutrients.  Severe allergic reactions (anaphylaxis). Some medicines, such as blood pressure medicine or water pills (diuretics), may lower your blood pressure below normal. Sometimes taking too much medicine or taking medicine not as directed can cause hypotension. TREATMENT  Hospitalization is sometimes required for hypotension if fluid or blood replacement is needed, if time is needed for medicines to wear off, or if further monitoring is needed. Treatment might include changing your diet, changing your medicines (including medicines aimed at raising your blood pressure), and use of support stockings. HOME CARE INSTRUCTIONS   Drink enough fluids to keep your urine clear or pale yellow.  Take your medicines as directed by your health care provider.  Get up slowly from reclining or sitting positions. This gives your blood pressure a chance to adjust.  Wear support stockings as directed by your health care provider.  Maintain a healthy diet by including nutritious food, such as fruits, vegetables, nuts, whole grains, and lean meats. SEEK MEDICAL CARE  IF:  You have vomiting or diarrhea.  You have a fever for more than 2-3 days.  You feel more thirsty than usual.  You feel weak and tired. SEEK IMMEDIATE MEDICAL CARE IF:   You have chest pain or a fast or irregular heartbeat.  You have a loss of feeling in some part of your body, or you lose movement in your arms or legs.  You have trouble speaking.  You become sweaty or feel lightheaded.  You faint. MAKE SURE YOU:   Understand these instructions.  Will watch your condition.  Will get help right away if you are not doing well or get worse.   This information is not intended to replace advice given to you by your health care provider. Make sure you discuss any questions you have with your health care provider.   Document Released: 06/12/2005 Document Revised: 04/02/2013 Document Reviewed: 12/13/2012 Elsevier Interactive Patient Education [employer].

## 2016-03-15 LAB — COMPREHENSIVE METABOLIC PANEL
ALBUMIN: 4.5 g/dL (ref 3.6–5.1)
ALT: 12 U/L (ref 6–29)
AST: 15 U/L (ref 10–35)
Alkaline Phosphatase: 70 U/L (ref 33–130)
BILIRUBIN TOTAL: 0.9 mg/dL (ref 0.2–1.2)
BUN: 25 mg/dL (ref 7–25)
CO2: 24 mmol/L (ref 20–31)
CREATININE: 0.68 mg/dL (ref 0.50–0.99)
Calcium: 10.1 mg/dL (ref 8.6–10.4)
Chloride: 108 mmol/L (ref 98–110)
GLUCOSE: 85 mg/dL (ref 65–99)
Potassium: 4.9 mmol/L (ref 3.5–5.3)
Sodium: 141 mmol/L (ref 135–146)
Total Protein: 7.6 g/dL (ref 6.1–8.1)

## 2016-03-15 LAB — THYROID PANEL WITH TSH
Free Thyroxine Index: 2.6 (ref 1.4–3.8)
T3 UPTAKE: 34 % (ref 22–35)
T4, Total: 7.5 ug/dL (ref 4.5–12.0)
TSH: 1.21 mIU/L

## 2016-03-19 ENCOUNTER — Emergency Department (HOSPITAL_COMMUNITY): Payer: Medicare Other

## 2016-03-19 ENCOUNTER — Emergency Department (HOSPITAL_COMMUNITY)
Admission: EM | Admit: 2016-03-19 | Discharge: 2016-03-19 | Disposition: A | Discharge status: Home / Self Care | Payer: Medicare Other | Attending: Emergency Medicine | Admitting: Emergency Medicine

## 2016-03-19 ENCOUNTER — Encounter (HOSPITAL_COMMUNITY): Payer: Self-pay | Admitting: Emergency Medicine

## 2016-03-19 DIAGNOSIS — R002 Palpitations: Secondary | ICD-10-CM

## 2016-03-19 DIAGNOSIS — Z87891 Personal history of nicotine dependence: Secondary | ICD-10-CM | POA: Diagnosis not present

## 2016-03-19 DIAGNOSIS — E039 Hypothyroidism, unspecified: Secondary | ICD-10-CM | POA: Diagnosis not present

## 2016-03-19 DIAGNOSIS — Z85038 Personal history of other malignant neoplasm of large intestine: Secondary | ICD-10-CM | POA: Diagnosis not present

## 2016-03-19 DIAGNOSIS — Z79899 Other long term (current) drug therapy: Secondary | ICD-10-CM | POA: Diagnosis not present

## 2016-03-19 DIAGNOSIS — R0789 Other chest pain: Secondary | ICD-10-CM | POA: Diagnosis not present

## 2016-03-19 DIAGNOSIS — R0602 Shortness of breath: Secondary | ICD-10-CM | POA: Diagnosis not present

## 2016-03-19 LAB — URINALYSIS, ROUTINE W REFLEX MICROSCOPIC
BILIRUBIN URINE: NEGATIVE
GLUCOSE, UA: NEGATIVE mg/dL
KETONES UR: NEGATIVE mg/dL
Nitrite: NEGATIVE
PH: 5 (ref 5.0–8.0)
Protein, ur: NEGATIVE mg/dL
Specific Gravity, Urine: 1.021 (ref 1.005–1.030)

## 2016-03-19 LAB — BASIC METABOLIC PANEL
Anion gap: 10 (ref 5–15)
BUN: 19 mg/dL (ref 6–20)
CALCIUM: 9.3 mg/dL (ref 8.9–10.3)
CO2: 19 mmol/L — AB (ref 22–32)
Chloride: 115 mmol/L — ABNORMAL HIGH (ref 101–111)
Creatinine, Ser: 0.62 mg/dL (ref 0.44–1.00)
GFR calc Af Amer: >60 mL/min (ref >60)
GFR calc non Af Amer: >60 mL/min (ref >60)
GLUCOSE: 103 mg/dL — AB (ref 65–99)
Potassium: 4.3 mmol/L (ref 3.5–5.1)
Sodium: 144 mmol/L (ref 135–145)

## 2016-03-19 LAB — CBC
HCT: 40.1 % (ref 36.0–46.0)
Hemoglobin: 13.2 g/dL (ref 12.0–15.0)
MCH: 31.1 pg (ref 26.0–34.0)
MCHC: 32.9 g/dL (ref 30.0–36.0)
MCV: 94.4 fL (ref 78.0–100.0)
PLATELETS: 248 10*3/uL (ref 150–400)
RBC: 4.25 MIL/uL (ref 3.87–5.11)
RDW: 13 % (ref 11.5–15.5)
WBC: 4.6 10*3/uL (ref 4.0–10.5)

## 2016-03-19 LAB — URINE MICROSCOPIC-ADD ON

## 2016-03-19 LAB — I-STAT TROPONIN, ED: TROPONIN I, POC: 0 ng/mL (ref 0.00–0.08)

## 2016-03-19 MED ORDER — CEPHALEXIN 500 MG PO CAPS: 500.0000 mg | ORAL_CAPSULE | Freq: Four times a day (QID) | ORAL | 0 refills | Status: DC

## 2016-03-19 MED ORDER — CEPHALEXIN 500 MG PO CAPS
500.0000 mg | ORAL_CAPSULE | Freq: Once | ORAL | Status: AC
  Administered 2016-03-19: 500 mg via ORAL
  Filled 2016-03-19: qty 1

## 2016-03-19 MED ORDER — SODIUM CHLORIDE 0.9 % IV BOLUS (SEPSIS)
1000.0000 mL | Freq: Once | INTRAVENOUS | Status: AC
  Administered 2016-03-19: 1000 mL via INTRAVENOUS

## 2016-03-19 NOTE — Discharge Instructions (Signed)
It was our pleasure to provide your ER care today - we hope that you feel better.  Rest. Drink plenty of fluids.  Your urine test shows a possible urine infection - take antibiotic as prescribed.  Follow up with your primary care doctor in the coming week.  For chest discomfort, follow up with cardiologist in the next 1-2 weeks - see referral, call office Monday to arrange follow up.  Return to ER if worse, new symptoms, weak/fainting, recurrent or persistent chest pain, trouble breathing, persistent fast heart beat, other concern.

## 2016-03-19 NOTE — ED Triage Notes (Signed)
Patient here from home with complaints of chest pressure and feels like "chest is pounding". Reports that it woke her up out of sleep. Denies nausea/vomiting.

## 2016-03-19 NOTE — ED Provider Notes (Addendum)
[facility] DEPT Provider Note   CSN: [id number] Arrival date & time: 03/19/16  [id number]     History   Chief Complaint Chief Complaint  Patient presents with  . Chest Pain    HPI Leslie Rivera is a 70 y.o. female.  Patient indicates awoke at 230 this AM, felt as if was flushed, felt tingly all over, had sense of chest and entire body pounding.  States she took a valium but then didn't feel better, was afraid of having heart attack, and so came to ED.  Does not take valium regularly, states was the first she took in many months. Symptoms occurred at rest. No relation to activity or exertion. No sob. No vomiting.  Has had normal appetite. No nvd. No dysuria. No recent change in meds or new meds. Hx pvcs, has worn monitors for same, no hx other dysrhythmia. No personal or family hx cad.    The history is provided by the patient.  Chest Pain   Pertinent negatives include no abdominal pain, no back pain, no cough, no fever, no headaches, no shortness of breath and no vomiting.    Past Medical History:  Diagnosis Date  . Abdominal pain   . Adenomatous colon polyp   . Anemia    Iron deficiency anemia  . Anxiety   . Cancer ([place])    colon  . History of migraine headaches   . Hypothyroidism   . Internal hemorrhoids   . Irregular heart beat    occ. PVC's  . Periumbilical pain     Patient Active Problem List   Diagnosis Date Noted  . Colon cancer ([place]) 07/17/2013  . Cancer of ascending colon s/p partial colectomy 07/17/13 06/11/2013    Past Surgical History:  Procedure Laterality Date  . BREAST SURGERY     removal abnormal cells in right  . LAPAROSCOPIC RIGHT COLECTOMY Right 07/17/2013   Procedure: LAPAROSCOPIC ASSISTED  RIGHT COLECTOMY;  Surgeon: Odis Hollingshead, MD;  Location: [facility]S;  Service: General;  Laterality: Right;  . TONSILLECTOMY     teenager- age 51    OB History    No data available       Home Medications    Prior to Admission medications     Medication Sig Start Date End Date Taking? Authorizing Provider  calcium carbonate (OS-CAL) 600 MG TABS tablet Take 600 mg by mouth daily with breakfast.     Historical Provider, MD  diazepam (VALIUM) 5 MG tablet Take 5 mg by mouth every 8 (eight) hours as needed for anxiety.  03/26/13   Historical Provider, MD  levothyroxine (SYNTHROID, LEVOTHROID) 75 MCG tablet Take 75 mcg by mouth daily. 04/27/15   Historical Provider, MD  Omega-3 Fatty Acids (FISH OIL) 1000 MG CAPS Take 1 capsule by mouth daily.     Historical Provider, MD  OVER THE COUNTER MEDICATION Take 1 tablet by mouth daily.     Historical Provider, MD  Probiotic Product (PROBIOTIC DAILY PO) Take 1 tablet by mouth daily.     Historical Provider, MD  traMADol (ULTRAM) 50 MG tablet Take 1 tablet (50 mg total) by mouth every 8 (eight) hours as needed. 03/14/16   Jaynee Eagles, PA-C    Family History Family History  Problem Relation Age of Onset  . Cancer Brother     internal melanoma  . Cancer Maternal Grandmother     colon    Social History Social History  Substance Use Topics  . Smoking status: Former Smoker  Quit date: 06/11/1997  . Smokeless tobacco: Never Used  . Alcohol use 0.0 oz/week     Comment: 2 ounces daily - Scotch     Allergies   Hydrocodone and Erythromycin   Review of Systems Review of Systems  Constitutional: Negative for chills and fever.  HENT: Negative for sore throat.   Eyes: Negative for redness.  Respiratory: Negative for cough and shortness of breath.   Cardiovascular: Positive for chest pain.  Gastrointestinal: Negative for abdominal pain and vomiting.  Genitourinary: Negative for dysuria and flank pain.  Musculoskeletal: Negative for back pain and neck pain.  Skin: Negative for rash.  Neurological: Negative for headaches.  Hematological: Does not bruise/bleed easily.  Psychiatric/Behavioral: Negative for confusion.     Physical Exam Updated Vital Signs BP 118/77 (BP Location: Left  Arm)   Pulse 67   Temp 97.9 F (36.6 C) (Oral)   Resp 10   SpO2 99%   Physical Exam  Constitutional: She appears well-developed and well-nourished. No distress.  HENT:  Mouth/Throat: Oropharynx is clear and moist.  Eyes: Conjunctivae are normal. No scleral icterus.  Neck: Neck supple. No tracheal deviation present.  Cardiovascular: Normal rate, regular rhythm, normal heart sounds and intact distal pulses.  Exam reveals no gallop and no friction rub.   No murmur heard. Pulmonary/Chest: Effort normal and breath sounds normal. No respiratory distress.  Abdominal: Soft. Normal appearance. She exhibits no distension. There is no tenderness.  Genitourinary:  Genitourinary Comments: No cva tenderness  Musculoskeletal: She exhibits no edema.  Neurological: She is alert.  Skin: Skin is warm and dry. No rash noted. She is not diaphoretic.  Psychiatric: She has a normal mood and affect.  Nursing note and vitals reviewed.    ED Treatments / Results  Labs (all labs ordered are listed, but only abnormal results are displayed) Results for orders placed or performed during the hospital encounter of [id number]  Basic metabolic panel  Result Value Ref Range   Sodium 144 135 - 145 mmol/L   Potassium 4.3 3.5 - 5.1 mmol/L   Chloride 115 (H) 101 - 111 mmol/L   CO2 19 (L) 22 - 32 mmol/L   Glucose, Bld 103 (H) 65 - 99 mg/dL   BUN 19 6 - 20 mg/dL   Creatinine, Ser 0.62 0.44 - 1.00 mg/dL   Calcium 9.3 8.9 - 10.3 mg/dL   GFR calc non Af Amer >60 >60 mL/min   GFR calc Af Amer >60 >60 mL/min   Anion gap 10 5 - 15  CBC  Result Value Ref Range   WBC 4.6 4.0 - 10.5 K/uL   RBC 4.25 3.87 - 5.11 MIL/uL   Hemoglobin 13.2 12.0 - 15.0 g/dL   HCT 40.1 36.0 - 46.0 %   MCV 94.4 78.0 - 100.0 fL   MCH 31.1 26.0 - 34.0 pg   MCHC 32.9 30.0 - 36.0 g/dL   RDW 13.0 11.5 - 15.5 %   Platelets 248 150 - 400 K/uL  Urinalysis, Routine w reflex microscopic (not at [facility])  Result Value Ref Range   Color, Urine YELLOW  YELLOW   APPearance CLEAR CLEAR   Specific Gravity, Urine 1.021 1.005 - 1.030   pH 5.0 5.0 - 8.0   Glucose, UA NEGATIVE NEGATIVE mg/dL   Hgb urine dipstick TRACE (A) NEGATIVE   Bilirubin Urine NEGATIVE NEGATIVE   Ketones, ur NEGATIVE NEGATIVE mg/dL   Protein, ur NEGATIVE NEGATIVE mg/dL   Nitrite NEGATIVE NEGATIVE   Leukocytes, UA SMALL (A)  NEGATIVE  Urine microscopic-add on  Result Value Ref Range   Squamous Epithelial / LPF 0-5 (A) NONE SEEN   WBC, UA 6-30 0 - 5 WBC/hpf   RBC / HPF 6-30 0 - 5 RBC/hpf   Bacteria, UA FEW (A) NONE SEEN   Crystals CA OXALATE CRYSTALS (A) NEGATIVE  I-stat troponin, ED  Result Value Ref Range   Troponin i, poc 0.00 0.00 - 0.08 ng/mL   Comment 3           Dg Chest 2 View  Result Date: 03/19/2016 CLINICAL DATA:  Chest tightness and shortness of breath for 3 hours. Former smoker. History of PVC. EXAM: CHEST  2 VIEW COMPARISON:  CT chest 08/20/2011 FINDINGS: Normal heart size and pulmonary vascularity. Hyperinflation suggesting emphysema. No focal airspace disease or consolidation in the lungs. No pneumothorax. No blunting of costophrenic angles. Mediastinal contours appear intact. Old left rib fractures. IMPRESSION: Emphysematous changes in the lungs. No evidence of active pulmonary disease. Electronically Signed   By: Lucienne Capers M.D.   On: 03/19/2016 05:37    EKG  EKG Interpretation  Date/Time:  Sunday March 19 2016 04:38:36 EDT Ventricular Rate:  66 PR Interval:    QRS Duration: 100 QT Interval:  428 QTC Calculation: 449 R Axis:   70 Text Interpretation:  Sinus rhythm Low voltage QRS No significant change since last tracing Confirmed by Ashok Cordia  MD, Lennette Bihari ([id number]) on 03/19/2016 4:45:08 AM       Radiology No results found.  Procedures Procedures (including critical care time)  Medications Ordered in ED Medications - No data to display   Initial Impression / Assessment and Plan / ED Course  I have reviewed the triage vital signs  and the nursing notes.  Pertinent labs & imaging results that were available during my care of the patient were reviewed by me and considered in my medical decision making (see chart for details).  Clinical Course    Continuous pulse ox and monitor. o2 [place]. Labs.   Reviewed nursing notes and prior charts for additional history.   1 liter ns iv.   Recheck remains in nsr on monitor.   Patient on recheck remains asymptomatic, no chest pain or discomfort. No sob.    Trop 0.   Possible uti on labs, with 6-30 wbc, and few bact - will rx.   Patient currently appears stable for d/c.   Rec close pcp f/u. For (atypical) chest pain and palpitations, also rec cardiology f/u.   Final Clinical Impressions(s) / ED Diagnoses   Final diagnoses:  None    New Prescriptions New Prescriptions   No medications on file         Lajean Saver, MD 03/19/16 [phone]

## 2016-03-19 NOTE — ED Notes (Signed)
Patient transported to X-ray 

## 2016-04-13 DIAGNOSIS — H04123 Dry eye syndrome of bilateral lacrimal glands: Secondary | ICD-10-CM | POA: Diagnosis not present

## 2016-04-13 DIAGNOSIS — H01004 Unspecified blepharitis left upper eyelid: Secondary | ICD-10-CM | POA: Diagnosis not present

## 2016-04-13 DIAGNOSIS — H01001 Unspecified blepharitis right upper eyelid: Secondary | ICD-10-CM | POA: Diagnosis not present

## 2016-05-05 DIAGNOSIS — Z23 Encounter for immunization: Secondary | ICD-10-CM | POA: Diagnosis not present

## 2016-05-16 ENCOUNTER — Other Ambulatory Visit: Payer: Self-pay | Admitting: Internal Medicine

## 2016-05-16 DIAGNOSIS — Z1231 Encounter for screening mammogram for malignant neoplasm of breast: Secondary | ICD-10-CM

## 2016-05-30 DIAGNOSIS — L853 Xerosis cutis: Secondary | ICD-10-CM | POA: Diagnosis not present

## 2016-05-30 DIAGNOSIS — Z23 Encounter for immunization: Secondary | ICD-10-CM | POA: Diagnosis not present

## 2016-05-30 DIAGNOSIS — L309 Dermatitis, unspecified: Secondary | ICD-10-CM | POA: Diagnosis not present

## 2016-06-28 ENCOUNTER — Ambulatory Visit: Payer: Self-pay

## 2016-07-26 ENCOUNTER — Ambulatory Visit (INDEPENDENT_AMBULATORY_CARE_PROVIDER_SITE_OTHER): Payer: Medicare Other | Admitting: Physician Assistant

## 2016-07-26 ENCOUNTER — Ambulatory Visit: Payer: Self-pay

## 2016-07-26 DIAGNOSIS — J069 Acute upper respiratory infection, unspecified: Secondary | ICD-10-CM | POA: Diagnosis not present

## 2016-07-26 DIAGNOSIS — B9789 Other viral agents as the cause of diseases classified elsewhere: Secondary | ICD-10-CM

## 2016-07-26 MED ORDER — PROMETHAZINE-PHENYLEPH-CODEINE 6.25-5-10 MG/5ML PO SYRP: 5.0000 mL | ORAL_SOLUTION | Freq: Four times a day (QID) | ORAL | 0 refills | Status: DC | PRN

## 2016-07-26 NOTE — Progress Notes (Signed)
Patient ID: Leslie Rivera, female    DOB: [birth year], 71 y.o.   MRN: [id number]  PCP: Harrison Mons, PA-C  Chief Complaint  Patient presents with  . Cough    X 3 days  . Sore Throat    X 1 week    Subjective:   Presents for evaluation of cough and sore throat.  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 About 6 days ago, she developed fatigue and runny nose, followed by cough. Cough produces clear to yellow sputum and is worse at night. No fever/chills. No muscle or joint aches. OTC Robitussin doesn't help much. She recalls having similar symptoms a little over a year ago and receiving a syrup that worked really well.    Review of Systems As above. No SOB, headache, GI/GU symptoms.    Patient Active Problem List   Diagnosis Date Noted  . Colon cancer ([place]) 07/17/2013  . Cancer of ascending colon s/p partial colectomy 07/17/13 06/11/2013     Prior to Admission medications   Medication Sig Start Date End Date Taking? Authorizing Provider  Artificial Tear Ointment (LUBRICANT EYE OP) Apply 1-3 drops to eye 4 (four) times daily as needed (for dry/irritated eyes.).   Yes Historical Provider, MD  aspirin EC 81 MG tablet Take 81 mg by mouth daily.   Yes Historical Provider, MD  calcium carbonate (OS-CAL) 600 MG TABS tablet Take 1,200 mg by mouth daily with breakfast.    Yes Historical Provider, MD  Cholecalciferol (VITAMIN D) 2000 units CAPS Take 2,000 Units by mouth daily.   Yes Historical Provider, MD  diazepam (VALIUM) 5 MG tablet Take 5 mg by mouth every 8 (eight) hours as needed for anxiety.  03/26/13  Yes Historical Provider, MD  levothyroxine (SYNTHROID, LEVOTHROID) 75 MCG tablet Take 75 mcg by mouth daily. 04/27/15  Yes Historical Provider, MD  Omega-3 Fatty Acids (FISH OIL) 1000 MG CAPS Take 1 capsule by mouth daily.    Yes Historical Provider, MD  traMADol (ULTRAM) 50 MG tablet Take 1 tablet (50 mg total) by mouth every 8 (eight) hours as  needed. 03/14/16  Yes Jaynee Eagles, PA-C  zolpidem (AMBIEN) 10 MG tablet Take 10 mg by mouth at bedtime as needed for sleep.   Yes Historical Provider, MD     Allergies  Allergen Reactions  . Hydrocodone Nausea And Vomiting    Can tolerate Oxycodone  . Erythromycin     GI upset       Objective:  Physical Exam  Constitutional: She is oriented to person, place, and time. She appears well-developed and well-nourished. No distress.  BP 114/74   Pulse 73   Temp 98.2 F (36.8 C) (Oral)   Ht 5\' 6"  (1.676 m)   Wt 141 lb 1.6 oz (64 kg)   SpO2 97%   BMI 22.77 kg/m    HENT:  Head: Normocephalic and atraumatic.  Right Ear: Hearing, tympanic membrane, external ear and ear canal normal.  Left Ear: Hearing, tympanic membrane, external ear and ear canal normal.  Nose: Mucosal edema and rhinorrhea present.  No foreign bodies. Right sinus exhibits no maxillary sinus tenderness and no frontal sinus tenderness. Left sinus exhibits no maxillary sinus tenderness and no frontal sinus tenderness.  Mouth/Throat: Uvula is midline, oropharynx is clear and moist and mucous membranes are normal. No uvula swelling. No oropharyngeal exudate.  Eyes: Conjunctivae and EOM are normal. Pupils are equal, round, and reactive to light. Right eye  exhibits no discharge. Left eye exhibits no discharge. No scleral icterus.  Neck: Trachea normal, normal range of motion and full passive range of motion without pain. Neck supple. No thyroid mass and no thyromegaly present.  Cardiovascular: Normal rate, regular rhythm and normal heart sounds.   Pulmonary/Chest: Effort normal and breath sounds normal.  Lymphadenopathy:       Head (right side): No submandibular, no tonsillar, no preauricular, no posterior auricular and no occipital adenopathy present.       Head (left side): No submandibular, no tonsillar, no preauricular and no occipital adenopathy present.    She has no cervical adenopathy.       Right: No supraclavicular  adenopathy present.       Left: No supraclavicular adenopathy present.  Neurological: She is alert and oriented to person, place, and time. She has normal strength. No cranial nerve deficit or sensory deficit.  Skin: Skin is warm, dry and intact. No rash noted.  Psychiatric: She has a normal mood and affect. Her speech is normal and behavior is normal.           Assessment & Plan:   1. Viral URI with cough Supportive care.  Anticipatory guidance.  RTC if symptoms worsen/persist. - Promethazine-Phenyleph-Codeine 6.25-5-10 MG/5ML SYRP; Take 5 mLs by mouth every 6 (six) hours as needed.  Dispense: 180 mL; Refill: 0   Fara Chute, PA-C Physician Assistant-Certified [facility] [place]

## 2016-07-26 NOTE — Progress Notes (Signed)
Patient ID: Leslie Rivera, female    DOB: April 17, 1946, 71 y.o.   MRN: [id number]  PCP: Harrison Mons, PA-C  Chief Complaint  Patient presents with  . Cough    X 3 days  . Sore Throat    X 1 week    Subjective:   Presents for evaluation of cough and sore throat.  Pt is a 71 yo caucasian female who presents with 5 days of cough and 3 days of sore throat. 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 She states that she starting feeling ill on 6 days ago with fatigue and rhinorrhea. She developed a cough the next day. The cough is worse at night and is mildly productive of clear to yellow sputum. She also admits to post nasal drip. She denies headache, myalgias, ear pain, SOB, chest pain, palpitations, anorexia, nausea, vomiting, diarrhea, or constipation. She had tried Robitussin with little relief, but it makes her "feel bad".  Pt states that she needs relief as she has a big order at work coming up. Pt really liked the medication she was given for her URI in Nov 2016.  Review of Systems See HPI    Patient Active Problem List   Diagnosis Date Noted  . Colon cancer ([place]) 07/17/2013  . Cancer of ascending colon s/p partial colectomy 07/17/13 06/11/2013     Prior to Admission medications   Medication Sig Start Date End Date Taking? Authorizing Provider  Artificial Tear Ointment (LUBRICANT EYE OP) Apply 1-3 drops to eye 4 (four) times daily as needed (for dry/irritated eyes.).   Yes Historical Provider, MD  aspirin EC 81 MG tablet Take 81 mg by mouth daily.   Yes Historical Provider, MD  calcium carbonate (OS-CAL) 600 MG TABS tablet Take 1,200 mg by mouth daily with breakfast.    Yes Historical Provider, MD  Cholecalciferol (VITAMIN D) 2000 units CAPS Take 2,000 Units by mouth daily.   Yes Historical Provider, MD  diazepam (VALIUM) 5 MG tablet Take 5 mg by mouth every 8 (eight) hours as needed for anxiety.  03/26/13  Yes Historical Provider, MD    levothyroxine (SYNTHROID, LEVOTHROID) 75 MCG tablet Take 75 mcg by mouth daily. 04/27/15  Yes Historical Provider, MD  Omega-3 Fatty Acids (FISH OIL) 1000 MG CAPS Take 1 capsule by mouth daily.    Yes Historical Provider, MD  traMADol (ULTRAM) 50 MG tablet Take 1 tablet (50 mg total) by mouth every 8 (eight) hours as needed. 03/14/16  Yes Jaynee Eagles, PA-C  zolpidem (AMBIEN) 10 MG tablet Take 10 mg by mouth at bedtime as needed for sleep.   Yes Historical Provider, MD  cephALEXin (KEFLEX) 500 MG capsule Take 1 capsule (500 mg total) by mouth 4 (four) times daily. Patient not taking: Reported on 07/26/2016 03/19/16   Lajean Saver, MD     Allergies  Allergen Reactions  . Hydrocodone Nausea And Vomiting    Can tolerate Oxycodone  . Erythromycin     GI upset       Objective:  Physical Exam HEENT: PERRLA. No conjunctival injection.Throat moderately erythematous, no exudates. Mild injection of right ear canal, no exudates bilaterally, TMs intact bilaterally, nonbulging. Mild left anterior cervical lymphadenopathy, nontender.  Pulm: Good respiratory effort.  CV: RRR. No M/R/G.  Abd: Soft, Nontender, nondistended. + BS x 4 quadrants.     Assessment & Plan:   1. Viral URI with cough Pt advised to drink plenty of water,  get rest, and take cough syrup as prescribed. Pt advised to return if symptoms persist or worsen or if fever develops.  - Promethazine-Phenyleph-Codeine 6.25-5-10 MG/5ML SYRP; Take 5 mLs by mouth every 6 (six) hours as needed.  Dispense: 180 mL; Refill: 0  Lorella Nimrod, PA-S

## 2016-07-26 NOTE — Patient Instructions (Addendum)
Get plenty of rest and drink at least 64 ounces of water daily.    IF you received an x-ray today, you will receive an invoice from [facility]. Please contact [facility] at [phone] with questions or concerns regarding your invoice.   IF you received labwork today, you will receive an invoice from [facility]. Please contact [facility] at [phone] with questions or concerns regarding your invoice.   Our billing staff will not be able to assist you with questions regarding bills from these companies.  You will be contacted with the lab results as soon as they are available. The fastest way to get your results is to activate your My Chart account. Instructions are located on the last page of this paperwork. If you have not heard from us regarding the results in 2 weeks, please contact this office.      

## 2016-08-23 DIAGNOSIS — C189 Malignant neoplasm of colon, unspecified: Secondary | ICD-10-CM | POA: Diagnosis not present

## 2016-08-23 DIAGNOSIS — F411 Generalized anxiety disorder: Secondary | ICD-10-CM | POA: Diagnosis not present

## 2016-08-23 DIAGNOSIS — E039 Hypothyroidism, unspecified: Secondary | ICD-10-CM | POA: Diagnosis not present

## 2016-08-23 DIAGNOSIS — Z780 Asymptomatic menopausal state: Secondary | ICD-10-CM | POA: Diagnosis not present

## 2016-08-23 DIAGNOSIS — E7800 Pure hypercholesterolemia, unspecified: Secondary | ICD-10-CM | POA: Diagnosis not present

## 2016-08-23 DIAGNOSIS — Z0000 Encounter for general adult medical examination without abnormal findings: Secondary | ICD-10-CM | POA: Diagnosis not present

## 2016-08-30 DIAGNOSIS — E039 Hypothyroidism, unspecified: Secondary | ICD-10-CM | POA: Diagnosis not present

## 2016-08-30 DIAGNOSIS — E875 Hyperkalemia: Secondary | ICD-10-CM | POA: Diagnosis not present

## 2016-08-30 DIAGNOSIS — R0989 Other specified symptoms and signs involving the circulatory and respiratory systems: Secondary | ICD-10-CM | POA: Diagnosis not present

## 2016-08-30 DIAGNOSIS — E7800 Pure hypercholesterolemia, unspecified: Secondary | ICD-10-CM | POA: Diagnosis not present

## 2016-09-19 ENCOUNTER — Ambulatory Visit
Admission: RE | Admit: 2016-09-19 | Discharge: 2016-09-19 | Disposition: A | Discharge status: Home / Self Care | Payer: Medicare Other | Source: Ambulatory Visit | Attending: Internal Medicine | Admitting: Internal Medicine

## 2016-09-19 DIAGNOSIS — Z1231 Encounter for screening mammogram for malignant neoplasm of breast: Secondary | ICD-10-CM | POA: Diagnosis not present

## 2017-05-09 ENCOUNTER — Encounter: Payer: Self-pay | Admitting: Emergency Medicine

## 2017-05-09 ENCOUNTER — Ambulatory Visit: Payer: Medicare Other | Admitting: Physician Assistant

## 2017-05-09 ENCOUNTER — Ambulatory Visit: Payer: Medicare Other | Admitting: Emergency Medicine

## 2017-05-09 ENCOUNTER — Other Ambulatory Visit: Payer: Self-pay

## 2017-05-09 DIAGNOSIS — J069 Acute upper respiratory infection, unspecified: Secondary | ICD-10-CM

## 2017-05-09 DIAGNOSIS — B9789 Other viral agents as the cause of diseases classified elsewhere: Secondary | ICD-10-CM

## 2017-05-09 MED ORDER — PROMETHAZINE-PHENYLEPH-CODEINE 6.25-5-10 MG/5ML PO SYRP: 5.0000 mL | ORAL_SOLUTION | Freq: Four times a day (QID) | ORAL | 0 refills | Status: DC | PRN

## 2017-05-09 NOTE — Progress Notes (Signed)
Leslie Rivera 71 y.o.   Chief Complaint  Patient presents with  . Cough    x 3 days  . Sore Throat    HISTORY OF PRESENT ILLNESS:   This is a 71 y.o. female complaining of 3 day h/o cough and sore throat.  Cough  This is a new problem. The current episode started in the past 7 days. The problem has been gradually worsening. The problem occurs constantly. The cough is non-productive. Associated symptoms include a sore throat. Pertinent negatives include no chest pain, chills, ear congestion, eye redness, fever, headaches, heartburn, hemoptysis, myalgias, rash, shortness of breath or wheezing. She has tried nothing for the symptoms.     Prior to Admission medications   Medication Sig Start Date End Date Taking? Authorizing Provider  Artificial Tear Ointment (LUBRICANT EYE OP) Apply 1-3 drops to eye 4 (four) times daily as needed (for dry/irritated eyes.).   Yes [provider]  aspirin EC 81 MG tablet Take 81 mg by mouth daily.   Yes [provider]  calcium carbonate (OS-CAL) 600 MG TABS tablet Take 1,200 mg by mouth daily with breakfast.    Yes [provider]  Cholecalciferol (VITAMIN D) 2000 units CAPS Take 2,000 Units by mouth daily.   Yes [provider]  diazepam (VALIUM) 5 MG tablet Take 5 mg by mouth every 8 (eight) hours as needed for anxiety.  03/26/13  Yes [provider]  levothyroxine (SYNTHROID, LEVOTHROID) 75 MCG tablet Take 75 mcg by mouth daily. 04/27/15  Yes [provider]  Omega-3 Fatty Acids (FISH OIL) 1000 MG CAPS Take 1 capsule by mouth daily.    Yes [provider]  zolpidem (AMBIEN) 10 MG tablet Take 10 mg by mouth at bedtime as needed for sleep.   Yes [provider]  Promethazine-Phenyleph-Codeine 6.25-5-10 MG/5ML SYRP Take 5 mLs by mouth every 6 (six) hours as needed. Patient not taking: Reported on 05/09/2017 07/26/16   Harrison Mons, PA-C  traMADol (ULTRAM) 50 MG tablet Take 1 tablet  (50 mg total) by mouth every 8 (eight) hours as needed. Patient not taking: Reported on 05/09/2017 03/14/16   Jaynee Eagles, PA-C    Allergies  Allergen Reactions  . Hydrocodone Nausea And Vomiting    Can tolerate Oxycodone  . Erythromycin     GI upset    Patient Active Problem List   Diagnosis Date Noted  . URI (upper respiratory infection) 07/26/2016  . Colon cancer ([place]) 07/17/2013  . Cancer of ascending colon s/p partial colectomy 07/17/13 06/11/2013    Past Medical History:  Diagnosis Date  . Abdominal pain   . Adenomatous colon polyp   . Anemia    Iron deficiency anemia  . Anxiety   . Cancer ([place])    colon  . History of migraine headaches   . Hypothyroidism   . Internal hemorrhoids   . Irregular heart beat    occ. PVC's  . Periumbilical pain     Past Surgical History:  Procedure Laterality Date  . BREAST BIOPSY Right   . BREAST SURGERY     removal abnormal cells in right  . TONSILLECTOMY     teenager- age 89    Social History   Socioeconomic History  . Marital status: Divorced    Spouse name: Not on file  . Number of children: Not on file  . Years of education: Not on file  . Highest education level: Not on file  Social Needs  . Financial  resource strain: Not on file  . Food insecurity - worry: Not on file  . Food insecurity - inability: Not on file  . Transportation needs - medical: Not on file  . Transportation needs - non-medical: Not on file  Occupational History  . Occupation: Copywriter, advertising  Tobacco Use  . Smoking status: Former Smoker    Last attempt to quit: 06/11/1997    Years since quitting: 19.9  . Smokeless tobacco: Never Used  Substance and Sexual Activity  . Alcohol use: Yes    Alcohol/week: 0.0 oz    Comment: 2 ounces daily - Scotch  . Drug use: No  . Sexual activity: Not Currently  Other Topics Concern  . Not on file  Social History Narrative  . Not on file    Family History  Problem Relation Age of Onset   . Cancer Brother        internal melanoma  . Cancer Maternal Grandmother        colon     Review of Systems  Constitutional: Negative for chills and fever.  HENT: Positive for sore throat. Negative for congestion and nosebleeds.   Eyes: Negative.  Negative for discharge and redness.  Respiratory: Positive for cough. Negative for hemoptysis, sputum production, shortness of breath and wheezing.   Cardiovascular: Negative for chest pain and palpitations.  Gastrointestinal: Negative for abdominal pain, diarrhea, heartburn, nausea and vomiting.  Genitourinary: Negative for dysuria and hematuria.  Musculoskeletal: Negative for myalgias.  Skin: Negative for rash.  Neurological: Negative.  Negative for dizziness and headaches.  Endo/Heme/Allergies: Negative.   All other systems reviewed and are negative.  Vitals:   05/09/17 1129  BP: 106/66  Pulse: 66  Resp: 16  Temp: 98.2 F (36.8 C)  SpO2: 97%     Physical Exam  Constitutional: She is oriented to person, place, and time. She appears well-developed and well-nourished.  HENT:  Head: Normocephalic and atraumatic.  Mouth/Throat: Oropharynx is clear and moist.  Eyes: Conjunctivae and EOM are normal. Pupils are equal, round, and reactive to light.  Neck: Normal range of motion. Neck supple. No JVD present. No thyromegaly present.  Cardiovascular: Normal rate, regular rhythm and normal heart sounds.  Pulmonary/Chest: Effort normal and breath sounds normal.  Musculoskeletal: Normal range of motion.  Lymphadenopathy:    She has no cervical adenopathy.  Neurological: She is alert and oriented to person, place, and time.  Skin: Skin is warm and dry. Capillary refill takes less than 2 seconds. No rash noted.  Psychiatric: She has a normal mood and affect. Her behavior is normal.  Vitals reviewed.    ASSESSMENT & PLAN: Supportive care.  Anticipatory guidance.  RTC if symptoms worsen/persist.  Leslie Rivera was seen today for cough and  sore throat.  Diagnoses and all orders for this visit:  Viral URI with cough -     Promethazine-Phenyleph-Codeine 6.25-5-10 MG/5ML SYRP; Take 5 mLs every 6 (six) hours as needed by mouth.    Patient Instructions       IF you received an x-ray today, you will receive an invoice from [facility]. Please contact [facility] at [phone] with questions or concerns regarding your invoice.   IF you received labwork today, you will receive an invoice from [facility]. Please contact [facility] at [phone] with questions or concerns regarding your invoice.   Our billing staff will not be able to assist you with questions regarding bills from these companies.  You will be contacted with the lab  results as soon as they are available. The fastest way to get your results is to activate your My Chart account. Instructions are located on the last page of this paperwork. If you have not heard from [place] regarding the results in 2 weeks, please contact this office.     Cough, Adult A cough helps to clear your throat and lungs. A cough may last only 2-3 weeks (acute), or it may last longer than 8 weeks (chronic). Many different things can cause a cough. A cough may be a sign of an illness or another medical condition. Follow these instructions at home:  Pay attention to any changes in your cough.  Take medicines only as told by your doctor. ? If you were prescribed an antibiotic medicine, take it as told by your doctor. Do not stop taking it even if you start to feel better. ? Talk with your doctor before you try using a cough medicine.  Drink enough fluid to keep your pee (urine) clear or pale yellow.  If the air is dry, use a cold steam vaporizer or humidifier in your home.  Stay away from things that make you cough at work or at home.  If your cough is worse at night, try using extra pillows to raise your head up higher while you sleep.  Do not smoke, and try not to be  around smoke. If you need help quitting, ask your doctor.  Do not have caffeine.  Do not drink alcohol.  Rest as needed. Contact a doctor if:  You have new problems (symptoms).  You cough up yellow fluid (pus).  Your cough does not get better after 2-3 weeks, or your cough gets worse.  Medicine does not help your cough and you are not sleeping well.  You have pain that gets worse or pain that is not helped with medicine.  You have a fever.  You are losing weight and you do not know why.  You have night sweats. Get help right away if:  You cough up blood.  You have trouble breathing.  Your heartbeat is very fast. This information is not intended to replace advice given to you by your health care provider. Make sure you discuss any questions you have with your health care provider. Document Released: 02/23/2011 Document Revised: 11/18/2015 Document Reviewed: 08/19/2014 Elsevier Interactive Patient Education  2018 Elsevier Inc.     Agustina Caroli, MD [facility]

## 2017-05-09 NOTE — Patient Instructions (Addendum)
     IF you received an x-ray today, you will receive an invoice from [facility]. Please contact [facility] at [phone] with questions or concerns regarding your invoice.   IF you received labwork today, you will receive an invoice from [facility]. Please contact [facility] at [phone] with questions or concerns regarding your invoice.   Our billing staff will not be able to assist you with questions regarding bills from these companies.  You will be contacted with the lab results as soon as they are available. The fastest way to get your results is to activate your My Chart account. Instructions are located on the last page of this paperwork. If you have not heard from us regarding the results in 2 weeks, please contact this office.     Cough, Adult A cough helps to clear your throat and lungs. A cough may last only 2-3 weeks (acute), or it may last longer than 8 weeks (chronic). Many different things can cause a cough. A cough may be a sign of an illness or another medical condition. Follow these instructions at home:  Pay attention to any changes in your cough.  Take medicines only as told by your doctor. ? If you were prescribed an antibiotic medicine, take it as told by your doctor. Do not stop taking it even if you start to feel better. ? Talk with your doctor before you try using a cough medicine.  Drink enough fluid to keep your pee (urine) clear or pale yellow.  If the air is dry, use a cold steam vaporizer or humidifier in your home.  Stay away from things that make you cough at work or at home.  If your cough is worse at night, try using extra pillows to raise your head up higher while you sleep.  Do not smoke, and try not to be around smoke. If you need help quitting, ask your doctor.  Do not have caffeine.  Do not drink alcohol.  Rest as needed. Contact a doctor if:  You have new problems (symptoms).  You cough up yellow fluid  (pus).  Your cough does not get better after 2-3 weeks, or your cough gets worse.  Medicine does not help your cough and you are not sleeping well.  You have pain that gets worse or pain that is not helped with medicine.  You have a fever.  You are losing weight and you do not know why.  You have night sweats. Get help right away if:  You cough up blood.  You have trouble breathing.  Your heartbeat is very fast. This information is not intended to replace advice given to you by your health care provider. Make sure you discuss any questions you have with your health care provider. Document Released: 02/23/2011 Document Revised: 11/18/2015 Document Reviewed: 08/19/2014 Elsevier Interactive Patient Education  2018 Elsevier Inc.  

## 2017-05-30 DIAGNOSIS — Z23 Encounter for immunization: Secondary | ICD-10-CM | POA: Diagnosis not present

## 2017-07-18 DIAGNOSIS — F329 Major depressive disorder, single episode, unspecified: Secondary | ICD-10-CM | POA: Diagnosis not present

## 2017-07-18 DIAGNOSIS — F411 Generalized anxiety disorder: Secondary | ICD-10-CM | POA: Diagnosis not present

## 2017-07-24 DIAGNOSIS — R140 Abdominal distension (gaseous): Secondary | ICD-10-CM | POA: Diagnosis not present

## 2017-07-24 DIAGNOSIS — R194 Change in bowel habit: Secondary | ICD-10-CM | POA: Diagnosis not present

## 2017-07-24 DIAGNOSIS — K219 Gastro-esophageal reflux disease without esophagitis: Secondary | ICD-10-CM | POA: Diagnosis not present

## 2017-07-24 DIAGNOSIS — K5730 Diverticulosis of large intestine without perforation or abscess without bleeding: Secondary | ICD-10-CM | POA: Diagnosis not present

## 2017-08-17 DIAGNOSIS — F329 Major depressive disorder, single episode, unspecified: Secondary | ICD-10-CM | POA: Diagnosis not present

## 2017-08-17 DIAGNOSIS — F411 Generalized anxiety disorder: Secondary | ICD-10-CM | POA: Diagnosis not present

## 2017-08-20 DIAGNOSIS — L821 Other seborrheic keratosis: Secondary | ICD-10-CM | POA: Diagnosis not present

## 2017-08-20 DIAGNOSIS — L814 Other melanin hyperpigmentation: Secondary | ICD-10-CM | POA: Diagnosis not present

## 2017-08-20 DIAGNOSIS — D1800 Hemangioma unspecified site: Secondary | ICD-10-CM | POA: Diagnosis not present

## 2017-08-20 DIAGNOSIS — Z23 Encounter for immunization: Secondary | ICD-10-CM | POA: Diagnosis not present

## 2017-09-03 ENCOUNTER — Other Ambulatory Visit: Payer: Self-pay | Admitting: Internal Medicine

## 2017-09-03 DIAGNOSIS — Z1231 Encounter for screening mammogram for malignant neoplasm of breast: Secondary | ICD-10-CM

## 2017-09-21 ENCOUNTER — Encounter: Payer: Self-pay | Admitting: Physician Assistant

## 2017-09-21 ENCOUNTER — Ambulatory Visit: Payer: Medicare Other | Admitting: Physician Assistant

## 2017-09-21 ENCOUNTER — Other Ambulatory Visit: Payer: Self-pay

## 2017-09-21 DIAGNOSIS — B9789 Other viral agents as the cause of diseases classified elsewhere: Secondary | ICD-10-CM

## 2017-09-21 DIAGNOSIS — J3089 Other allergic rhinitis: Secondary | ICD-10-CM | POA: Diagnosis not present

## 2017-09-21 DIAGNOSIS — J069 Acute upper respiratory infection, unspecified: Secondary | ICD-10-CM

## 2017-09-21 MED ORDER — PROMETHAZINE-PHENYLEPH-CODEINE 6.25-5-10 MG/5ML PO SYRP: 5.0000 mL | ORAL_SOLUTION | Freq: Four times a day (QID) | ORAL | 0 refills | Status: DC | PRN

## 2017-09-21 NOTE — Progress Notes (Signed)
Patient ID: Leslie Rivera, female    DOB: [birth year], 72 y.o.   MRN: [id number]  PCP: Harrison Mons, PA-C  Chief Complaint  Patient presents with  . Sinusitis    cough all night, sneeze all day ears, eyes and teeth hurt x 10 days     Subjective:   Presents for evaluation of sinus pressure, congestion and drainage, with cough.  Feels like her typical allergy symptoms. Promethazine and codeine always helps. OTC measures without benefit.  First colonoscopy in 3 years is scheduled for May. Feeling a little nervous. Saw GI in January, normal labs. Has a lot of IBS-type symptoms.  Her mother died in 05/26/23. Misses her tremendously. 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   Review of Systems  Constitutional: Positive for fatigue. Negative for chills, diaphoresis and fever.  HENT: Positive for congestion, postnasal drip, rhinorrhea, sinus pressure, sneezing and sore throat. Negative for dental problem, drooling, ear discharge, ear pain, facial swelling, hearing loss, mouth sores, nosebleeds, sinus pain, tinnitus, trouble swallowing and voice change.   Eyes: Negative for visual disturbance.  Respiratory: Positive for cough. Negative for choking, chest tightness, shortness of breath, wheezing and stridor.   Cardiovascular: Negative for chest pain and palpitations.  Gastrointestinal: Negative for diarrhea, nausea and vomiting.  Musculoskeletal: Negative for arthralgias and myalgias.  Skin: Negative for rash and wound.  Allergic/Immunologic: Positive for environmental allergies. Negative for food allergies.  Neurological: Negative for dizziness, weakness, light-headedness and headaches.  Hematological: Negative for adenopathy.  Psychiatric/Behavioral: Positive for sleep disturbance (due to cough).       Patient  Active Problem List   Diagnosis Date Noted  . Environmental and seasonal allergies 09/21/2017  . Cancer of ascending colon s/p partial colectomy 07/17/13 06/11/2013     Prior to Admission medications   Medication Sig Start Date End Date Taking? Authorizing Provider  Artificial Tear Ointment (LUBRICANT EYE OP) Apply 1-3 drops to eye 4 (four) times daily as needed (for dry/irritated eyes.).   Yes [provider]  aspirin EC 81 MG tablet Take 81 mg by mouth daily.   Yes [provider]  calcium carbonate (OS-CAL) 600 MG TABS tablet Take 1,200 mg by mouth daily with breakfast.    Yes [provider]  Cholecalciferol (VITAMIN D) 2000 units CAPS Take 2,000 Units by mouth daily.   Yes [provider]  diazepam (VALIUM) 5 MG tablet Take 5 mg by mouth every 8 (eight) hours as needed for anxiety.  03/26/13  Yes [provider]  levothyroxine (SYNTHROID, LEVOTHROID) 75 MCG tablet Take 75 mcg by mouth daily. 04/27/15  Yes [provider]  Omega-3 Fatty Acids (FISH OIL) 1000 MG CAPS Take 1 capsule by mouth daily.    Yes [provider]  Promethazine-Phenyleph-Codeine 6.25-5-10 MG/5ML SYRP Take 5 mLs every 6 (six) hours as needed by mouth. 05/09/17  Yes Sagardia, Ines Bloomer, MD  traMADol (ULTRAM) 50 MG tablet Take 1 tablet (50 mg total) by mouth every 8 (eight) hours as needed. 03/14/16  Yes Jaynee Eagles, PA-C  zolpidem (AMBIEN) 10 MG tablet Take 10 mg by mouth at bedtime as needed for sleep.   Yes [provider]     Allergies  Allergen Reactions  . Hydrocodone Nausea And Vomiting    Can tolerate Oxycodone  .  Erythromycin     GI upset       Objective:  Physical Exam  Constitutional: She is oriented to person, place, and time. She appears well-developed and well-nourished. No distress.  BP 118/74   Pulse 82   Temp 97.6 F (36.4 C)   Resp 16   Ht 5\' 6"  (1.676 m)   Wt 139 lb (63 kg)   SpO2 97%   BMI 22.44 kg/m    HENT:    Head: Normocephalic and atraumatic.  Right Ear: Hearing, tympanic membrane, external ear and ear canal normal.  Left Ear: Hearing, tympanic membrane, external ear and ear canal normal.  Nose: Mucosal edema and rhinorrhea present.  No foreign bodies. Right sinus exhibits no maxillary sinus tenderness and no frontal sinus tenderness. Left sinus exhibits no maxillary sinus tenderness and no frontal sinus tenderness.  Mouth/Throat: Uvula is midline, oropharynx is clear and moist and mucous membranes are normal. No uvula swelling. No oropharyngeal exudate.  Eyes: Pupils are equal, round, and reactive to light. Conjunctivae and EOM are normal. Right eye exhibits no discharge. Left eye exhibits no discharge. No scleral icterus.  Neck: Trachea normal, normal range of motion and full passive range of motion without pain. Neck supple. No thyroid mass and no thyromegaly present.  Cardiovascular: Normal rate, regular rhythm and normal heart sounds.  Pulmonary/Chest: Effort normal and breath sounds normal.  Lymphadenopathy:       Head (right side): No submandibular, no tonsillar, no preauricular, no posterior auricular and no occipital adenopathy present.       Head (left side): No submandibular, no tonsillar, no preauricular and no occipital adenopathy present.    She has no cervical adenopathy.       Right: No supraclavicular adenopathy present.       Left: No supraclavicular adenopathy present.  Neurological: She is alert and oriented to person, place, and time. She has normal strength. No cranial nerve deficit or sensory deficit.  Skin: Skin is warm, dry and intact. No rash noted.  Psychiatric: She has a normal mood and affect. Her speech is normal and behavior is normal.           Assessment & Plan:   Problem List Items Addressed This Visit    Environmental and seasonal allergies    Other Visit Diagnoses    Viral URI with cough       Relevant Medications   Promethazine-Phenyleph-Codeine  6.25-5-10 MG/5ML SYRP       Return if symptoms worsen or fail to improve.   Fara Chute, PA-C [facility]

## 2017-09-21 NOTE — Patient Instructions (Signed)
     IF you received an x-ray today, you will receive an invoice from [facility]. Please contact [facility] at [phone] with questions or concerns regarding your invoice.   IF you received labwork today, you will receive an invoice from [facility]. Please contact [facility] at [phone] with questions or concerns regarding your invoice.   Our billing staff will not be able to assist you with questions regarding bills from these companies.  You will be contacted with the lab results as soon as they are available. The fastest way to get your results is to activate your My Chart account. Instructions are located on the last page of this paperwork. If you have not heard from us regarding the results in 2 weeks, please contact this office.     

## 2017-09-22 ENCOUNTER — Other Ambulatory Visit: Payer: Self-pay

## 2017-09-22 DIAGNOSIS — J069 Acute upper respiratory infection, unspecified: Secondary | ICD-10-CM

## 2017-09-22 DIAGNOSIS — B9789 Other viral agents as the cause of diseases classified elsewhere: Principal | ICD-10-CM

## 2017-09-22 MED ORDER — PROMETHAZINE-PHENYLEPH-CODEINE 6.25-5-10 MG/5ML PO SYRP
5.0000 mL | ORAL_SOLUTION | Freq: Four times a day (QID) | ORAL | 0 refills | Status: DC | PRN
Start: 2017-09-22 — End: 2020-12-15

## 2017-09-22 MED ORDER — PROMETHAZINE-PHENYLEPH-CODEINE 6.25-5-10 MG/5ML PO SYRP: 5.0000 mL | ORAL_SOLUTION | Freq: Four times a day (QID) | ORAL | 0 refills | Status: DC | PRN

## 2017-09-22 NOTE — Telephone Encounter (Signed)
Pt seen 3/29 - given cough syrup w/codeine. [facility] it was sent to does not have it.  [facility] has it  Please transfer med.

## 2017-09-22 NOTE — Addendum Note (Signed)
Addended by: Fara Chute on: 09/22/2017 01:29 PM   Modules accepted: Orders

## 2017-09-22 NOTE — Telephone Encounter (Signed)
Meds ordered this encounter  Medications  . Promethazine-Phenyleph-Codeine 6.25-5-10 MG/5ML SYRP    Sig: Take 5 mLs by mouth every 6 (six) hours as needed.    Dispense:  180 mL    Refill:  0

## 2017-09-22 NOTE — Telephone Encounter (Signed)
Patient called again. The second pharmacy also does not have the medication in stock. Merrilee Seashore called her next 3 top pharmacy choices and identified one that DOES have it in stock. Reordered and sent there. Merrilee Seashore notified the patient.

## 2017-09-26 ENCOUNTER — Ambulatory Visit
Admission: RE | Admit: 2017-09-26 | Discharge: 2017-09-26 | Disposition: A | Discharge status: Home / Self Care | Payer: Medicare Other | Source: Ambulatory Visit | Attending: Internal Medicine | Admitting: Internal Medicine

## 2017-09-26 DIAGNOSIS — Z1231 Encounter for screening mammogram for malignant neoplasm of breast: Secondary | ICD-10-CM

## 2017-10-01 ENCOUNTER — Encounter: Payer: Self-pay | Admitting: Physician Assistant

## 2017-10-05 DIAGNOSIS — H524 Presbyopia: Secondary | ICD-10-CM | POA: Diagnosis not present

## 2017-10-09 DIAGNOSIS — E039 Hypothyroidism, unspecified: Secondary | ICD-10-CM | POA: Diagnosis not present

## 2017-10-09 DIAGNOSIS — Z780 Asymptomatic menopausal state: Secondary | ICD-10-CM | POA: Diagnosis not present

## 2017-10-09 DIAGNOSIS — C189 Malignant neoplasm of colon, unspecified: Secondary | ICD-10-CM | POA: Diagnosis not present

## 2017-10-09 DIAGNOSIS — Z0000 Encounter for general adult medical examination without abnormal findings: Secondary | ICD-10-CM | POA: Diagnosis not present

## 2017-10-09 DIAGNOSIS — E7800 Pure hypercholesterolemia, unspecified: Secondary | ICD-10-CM | POA: Diagnosis not present

## 2017-10-16 DIAGNOSIS — E875 Hyperkalemia: Secondary | ICD-10-CM | POA: Diagnosis not present

## 2017-10-16 DIAGNOSIS — E7800 Pure hypercholesterolemia, unspecified: Secondary | ICD-10-CM | POA: Diagnosis not present

## 2017-10-16 DIAGNOSIS — M810 Age-related osteoporosis without current pathological fracture: Secondary | ICD-10-CM | POA: Diagnosis not present

## 2017-10-16 DIAGNOSIS — C189 Malignant neoplasm of colon, unspecified: Secondary | ICD-10-CM | POA: Diagnosis not present

## 2017-10-16 DIAGNOSIS — Z780 Asymptomatic menopausal state: Secondary | ICD-10-CM | POA: Diagnosis not present

## 2017-11-02 DIAGNOSIS — Z85038 Personal history of other malignant neoplasm of large intestine: Secondary | ICD-10-CM | POA: Diagnosis not present

## 2017-11-02 DIAGNOSIS — Z1211 Encounter for screening for malignant neoplasm of colon: Secondary | ICD-10-CM | POA: Diagnosis not present

## 2017-11-02 DIAGNOSIS — D125 Benign neoplasm of sigmoid colon: Secondary | ICD-10-CM | POA: Diagnosis not present

## 2017-11-02 DIAGNOSIS — K635 Polyp of colon: Secondary | ICD-10-CM | POA: Diagnosis not present

## 2017-11-02 DIAGNOSIS — D123 Benign neoplasm of transverse colon: Secondary | ICD-10-CM | POA: Diagnosis not present

## 2018-01-03 DIAGNOSIS — S91302A Unspecified open wound, left foot, initial encounter: Secondary | ICD-10-CM | POA: Diagnosis not present

## 2018-01-03 DIAGNOSIS — R0989 Other specified symptoms and signs involving the circulatory and respiratory systems: Secondary | ICD-10-CM | POA: Diagnosis not present

## 2018-01-03 DIAGNOSIS — C189 Malignant neoplasm of colon, unspecified: Secondary | ICD-10-CM | POA: Diagnosis not present

## 2018-01-03 DIAGNOSIS — E7800 Pure hypercholesterolemia, unspecified: Secondary | ICD-10-CM | POA: Diagnosis not present

## 2018-03-25 DIAGNOSIS — C189 Malignant neoplasm of colon, unspecified: Secondary | ICD-10-CM | POA: Diagnosis not present

## 2018-03-25 DIAGNOSIS — J988 Other specified respiratory disorders: Secondary | ICD-10-CM | POA: Diagnosis not present

## 2018-03-25 DIAGNOSIS — J0100 Acute maxillary sinusitis, unspecified: Secondary | ICD-10-CM | POA: Diagnosis not present

## 2018-03-25 DIAGNOSIS — E7800 Pure hypercholesterolemia, unspecified: Secondary | ICD-10-CM | POA: Diagnosis not present

## 2018-05-01 DIAGNOSIS — Z23 Encounter for immunization: Secondary | ICD-10-CM | POA: Diagnosis not present

## 2018-06-18 DIAGNOSIS — J1189 Influenza due to unidentified influenza virus with other manifestations: Secondary | ICD-10-CM | POA: Diagnosis not present

## 2018-08-27 DIAGNOSIS — L814 Other melanin hyperpigmentation: Secondary | ICD-10-CM | POA: Diagnosis not present

## 2018-08-27 DIAGNOSIS — L821 Other seborrheic keratosis: Secondary | ICD-10-CM | POA: Diagnosis not present

## 2018-08-27 DIAGNOSIS — Z23 Encounter for immunization: Secondary | ICD-10-CM | POA: Diagnosis not present

## 2018-08-27 DIAGNOSIS — L570 Actinic keratosis: Secondary | ICD-10-CM | POA: Diagnosis not present

## 2018-08-29 DIAGNOSIS — R110 Nausea: Secondary | ICD-10-CM | POA: Diagnosis not present

## 2018-08-29 DIAGNOSIS — F411 Generalized anxiety disorder: Secondary | ICD-10-CM | POA: Diagnosis not present

## 2018-11-21 ENCOUNTER — Other Ambulatory Visit: Payer: Self-pay | Admitting: Internal Medicine

## 2018-11-21 DIAGNOSIS — Z1231 Encounter for screening mammogram for malignant neoplasm of breast: Secondary | ICD-10-CM

## 2018-11-27 DIAGNOSIS — E7800 Pure hypercholesterolemia, unspecified: Secondary | ICD-10-CM | POA: Diagnosis not present

## 2018-11-27 DIAGNOSIS — M8589 Other specified disorders of bone density and structure, multiple sites: Secondary | ICD-10-CM | POA: Diagnosis not present

## 2018-11-27 DIAGNOSIS — Z23 Encounter for immunization: Secondary | ICD-10-CM | POA: Diagnosis not present

## 2018-11-27 DIAGNOSIS — Z0000 Encounter for general adult medical examination without abnormal findings: Secondary | ICD-10-CM | POA: Diagnosis not present

## 2018-11-27 DIAGNOSIS — E039 Hypothyroidism, unspecified: Secondary | ICD-10-CM | POA: Diagnosis not present

## 2018-11-27 DIAGNOSIS — R05 Cough: Secondary | ICD-10-CM | POA: Diagnosis not present

## 2018-11-27 DIAGNOSIS — Z7189 Other specified counseling: Secondary | ICD-10-CM | POA: Diagnosis not present

## 2018-11-27 DIAGNOSIS — M810 Age-related osteoporosis without current pathological fracture: Secondary | ICD-10-CM | POA: Diagnosis not present

## 2018-11-27 DIAGNOSIS — E559 Vitamin D deficiency, unspecified: Secondary | ICD-10-CM | POA: Diagnosis not present

## 2018-12-02 DIAGNOSIS — Z0000 Encounter for general adult medical examination without abnormal findings: Secondary | ICD-10-CM | POA: Diagnosis not present

## 2018-12-02 DIAGNOSIS — E7800 Pure hypercholesterolemia, unspecified: Secondary | ICD-10-CM | POA: Diagnosis not present

## 2018-12-02 DIAGNOSIS — M810 Age-related osteoporosis without current pathological fracture: Secondary | ICD-10-CM | POA: Diagnosis not present

## 2018-12-02 DIAGNOSIS — C189 Malignant neoplasm of colon, unspecified: Secondary | ICD-10-CM | POA: Diagnosis not present

## 2018-12-26 DIAGNOSIS — B0239 Other herpes zoster eye disease: Secondary | ICD-10-CM | POA: Diagnosis not present

## 2018-12-31 DIAGNOSIS — B0239 Other herpes zoster eye disease: Secondary | ICD-10-CM | POA: Diagnosis not present

## 2019-01-07 ENCOUNTER — Ambulatory Visit: Payer: Medicare Other

## 2019-02-19 DIAGNOSIS — Z23 Encounter for immunization: Secondary | ICD-10-CM | POA: Diagnosis not present

## 2019-02-21 DIAGNOSIS — H04123 Dry eye syndrome of bilateral lacrimal glands: Secondary | ICD-10-CM | POA: Diagnosis not present

## 2019-02-21 DIAGNOSIS — Z961 Presence of intraocular lens: Secondary | ICD-10-CM | POA: Diagnosis not present

## 2019-05-28 ENCOUNTER — Other Ambulatory Visit: Payer: Self-pay

## 2019-05-28 DIAGNOSIS — Z20822 Contact with and (suspected) exposure to covid-19: Secondary | ICD-10-CM

## 2019-05-30 LAB — NOVEL CORONAVIRUS, NAA: SARS-CoV-2, NAA: NOT DETECTED

## 2019-07-16 ENCOUNTER — Ambulatory Visit: Payer: Medicare Other | Attending: Internal Medicine

## 2019-07-16 DIAGNOSIS — Z23 Encounter for immunization: Secondary | ICD-10-CM | POA: Insufficient documentation

## 2019-07-16 NOTE — Progress Notes (Signed)
   Covid-19 Vaccination Clinic  Name:  Leslie Rivera    MRN: [id number] DOB: 1946-05-01  07/16/2019  [name] was observed post Covid-19 immunization for 15 minutes without incidence. She was provided with Vaccine Information Sheet and instruction to access the V-Safe system.   [name] was instructed to call 911 with any severe reactions post vaccine: Marland Kitchen Difficulty breathing  . Swelling of your face and throat  . A fast heartbeat  . A bad rash all over your body  . Dizziness and weakness    Immunizations Administered    Name Date Dose VIS Date Route   Pfizer COVID-19 Vaccine 07/16/2019  3:54 PM 0.3 mL 06/06/2019 Intramuscular   Manufacturer: [place]   Lot: [id number]   [place]: [id number]

## 2019-08-05 ENCOUNTER — Ambulatory Visit: Payer: Medicare Other | Attending: Internal Medicine

## 2019-08-05 DIAGNOSIS — Z23 Encounter for immunization: Secondary | ICD-10-CM | POA: Insufficient documentation

## 2019-08-05 NOTE — Progress Notes (Signed)
   Covid-19 Vaccination Clinic  Name:  VERNIE LAMBERSON    MRN: [id number] DOB: [birth year]  08/05/2019  Ms. Dipiero was observed post Covid-19 immunization for 15 minutes without incidence. She was provided with Vaccine Information Sheet and instruction to access the V-Safe system.   Ms. Tapani was instructed to call 911 with any severe reactions post vaccine: Marland Kitchen Difficulty breathing  . Swelling of your face and throat  . A fast heartbeat  . A bad rash all over your body  . Dizziness and weakness    Immunizations Administered    Name Date Dose VIS Date Route   Pfizer COVID-19 Vaccine 08/05/2019  8:55 AM 0.3 mL 06/06/2019 Intramuscular   Manufacturer: [place]   Lot: [id number]   [place]: [id number]

## 2019-08-16 ENCOUNTER — Ambulatory Visit: Payer: Medicare Other

## 2019-09-02 DIAGNOSIS — L814 Other melanin hyperpigmentation: Secondary | ICD-10-CM | POA: Diagnosis not present

## 2019-09-02 DIAGNOSIS — L821 Other seborrheic keratosis: Secondary | ICD-10-CM | POA: Diagnosis not present

## 2019-09-02 DIAGNOSIS — Z85828 Personal history of other malignant neoplasm of skin: Secondary | ICD-10-CM | POA: Diagnosis not present

## 2019-09-02 DIAGNOSIS — D225 Melanocytic nevi of trunk: Secondary | ICD-10-CM | POA: Diagnosis not present

## 2019-09-02 DIAGNOSIS — L570 Actinic keratosis: Secondary | ICD-10-CM | POA: Diagnosis not present

## 2019-10-03 ENCOUNTER — Ambulatory Visit: Payer: Medicare Other

## 2019-10-10 ENCOUNTER — Ambulatory Visit
Admission: RE | Admit: 2019-10-10 | Discharge: 2019-10-10 | Disposition: A | Discharge status: Home / Self Care | Payer: Medicare Other | Source: Ambulatory Visit | Attending: Internal Medicine | Admitting: Internal Medicine

## 2019-10-10 ENCOUNTER — Other Ambulatory Visit: Payer: Self-pay

## 2019-10-10 DIAGNOSIS — Z1231 Encounter for screening mammogram for malignant neoplasm of breast: Secondary | ICD-10-CM | POA: Diagnosis not present

## 2019-12-15 DIAGNOSIS — E039 Hypothyroidism, unspecified: Secondary | ICD-10-CM | POA: Diagnosis not present

## 2019-12-15 DIAGNOSIS — E7800 Pure hypercholesterolemia, unspecified: Secondary | ICD-10-CM | POA: Diagnosis not present

## 2019-12-15 DIAGNOSIS — F320 Major depressive disorder, single episode, mild: Secondary | ICD-10-CM | POA: Diagnosis not present

## 2019-12-15 DIAGNOSIS — Z0000 Encounter for general adult medical examination without abnormal findings: Secondary | ICD-10-CM | POA: Diagnosis not present

## 2019-12-22 DIAGNOSIS — E559 Vitamin D deficiency, unspecified: Secondary | ICD-10-CM | POA: Diagnosis not present

## 2019-12-22 DIAGNOSIS — F320 Major depressive disorder, single episode, mild: Secondary | ICD-10-CM | POA: Diagnosis not present

## 2019-12-22 DIAGNOSIS — E039 Hypothyroidism, unspecified: Secondary | ICD-10-CM | POA: Diagnosis not present

## 2019-12-22 DIAGNOSIS — Z0000 Encounter for general adult medical examination without abnormal findings: Secondary | ICD-10-CM | POA: Diagnosis not present

## 2020-01-29 DIAGNOSIS — N9089 Other specified noninflammatory disorders of vulva and perineum: Secondary | ICD-10-CM | POA: Diagnosis not present

## 2020-01-29 DIAGNOSIS — Z1212 Encounter for screening for malignant neoplasm of rectum: Secondary | ICD-10-CM | POA: Diagnosis not present

## 2020-01-29 DIAGNOSIS — Z01419 Encounter for gynecological examination (general) (routine) without abnormal findings: Secondary | ICD-10-CM | POA: Diagnosis not present

## 2020-02-03 DIAGNOSIS — K5730 Diverticulosis of large intestine without perforation or abscess without bleeding: Secondary | ICD-10-CM | POA: Diagnosis not present

## 2020-02-03 DIAGNOSIS — N9069 Other specified hypertrophy of vulva: Secondary | ICD-10-CM | POA: Diagnosis not present

## 2020-02-03 DIAGNOSIS — Z86010 Personal history of colonic polyps: Secondary | ICD-10-CM | POA: Diagnosis not present

## 2020-02-03 DIAGNOSIS — Z85038 Personal history of other malignant neoplasm of large intestine: Secondary | ICD-10-CM | POA: Diagnosis not present

## 2020-02-03 DIAGNOSIS — R194 Change in bowel habit: Secondary | ICD-10-CM | POA: Diagnosis not present

## 2020-03-05 DIAGNOSIS — R3982 Chronic bladder pain: Secondary | ICD-10-CM | POA: Diagnosis not present

## 2020-03-29 DIAGNOSIS — R3982 Chronic bladder pain: Secondary | ICD-10-CM | POA: Diagnosis not present

## 2020-03-29 DIAGNOSIS — M6289 Other specified disorders of muscle: Secondary | ICD-10-CM | POA: Diagnosis not present

## 2020-03-29 DIAGNOSIS — M62838 Other muscle spasm: Secondary | ICD-10-CM | POA: Diagnosis not present

## 2020-03-29 DIAGNOSIS — M6281 Muscle weakness (generalized): Secondary | ICD-10-CM | POA: Diagnosis not present

## 2020-04-19 DIAGNOSIS — M62838 Other muscle spasm: Secondary | ICD-10-CM | POA: Diagnosis not present

## 2020-04-19 DIAGNOSIS — M6281 Muscle weakness (generalized): Secondary | ICD-10-CM | POA: Diagnosis not present

## 2020-04-19 DIAGNOSIS — M6289 Other specified disorders of muscle: Secondary | ICD-10-CM | POA: Diagnosis not present

## 2020-04-19 DIAGNOSIS — R102 Pelvic and perineal pain: Secondary | ICD-10-CM | POA: Diagnosis not present

## 2020-04-23 DIAGNOSIS — K130 Diseases of lips: Secondary | ICD-10-CM | POA: Diagnosis not present

## 2020-04-23 DIAGNOSIS — L710 Perioral dermatitis: Secondary | ICD-10-CM | POA: Diagnosis not present

## 2020-04-23 DIAGNOSIS — L570 Actinic keratosis: Secondary | ICD-10-CM | POA: Diagnosis not present

## 2020-04-27 DIAGNOSIS — M6289 Other specified disorders of muscle: Secondary | ICD-10-CM | POA: Diagnosis not present

## 2020-04-27 DIAGNOSIS — M6281 Muscle weakness (generalized): Secondary | ICD-10-CM | POA: Diagnosis not present

## 2020-04-27 DIAGNOSIS — M62838 Other muscle spasm: Secondary | ICD-10-CM | POA: Diagnosis not present

## 2020-04-27 DIAGNOSIS — R3915 Urgency of urination: Secondary | ICD-10-CM | POA: Diagnosis not present

## 2020-05-13 DIAGNOSIS — M6289 Other specified disorders of muscle: Secondary | ICD-10-CM | POA: Diagnosis not present

## 2020-05-13 DIAGNOSIS — R3915 Urgency of urination: Secondary | ICD-10-CM | POA: Diagnosis not present

## 2020-05-13 DIAGNOSIS — M6281 Muscle weakness (generalized): Secondary | ICD-10-CM | POA: Diagnosis not present

## 2020-05-13 DIAGNOSIS — M62838 Other muscle spasm: Secondary | ICD-10-CM | POA: Diagnosis not present

## 2020-06-01 DIAGNOSIS — R3915 Urgency of urination: Secondary | ICD-10-CM | POA: Diagnosis not present

## 2020-06-01 DIAGNOSIS — M62838 Other muscle spasm: Secondary | ICD-10-CM | POA: Diagnosis not present

## 2020-06-01 DIAGNOSIS — M6289 Other specified disorders of muscle: Secondary | ICD-10-CM | POA: Diagnosis not present

## 2020-06-01 DIAGNOSIS — M6281 Muscle weakness (generalized): Secondary | ICD-10-CM | POA: Diagnosis not present

## 2020-06-08 DIAGNOSIS — M6281 Muscle weakness (generalized): Secondary | ICD-10-CM | POA: Diagnosis not present

## 2020-06-08 DIAGNOSIS — M6289 Other specified disorders of muscle: Secondary | ICD-10-CM | POA: Diagnosis not present

## 2020-06-08 DIAGNOSIS — L910 Hypertrophic scar: Secondary | ICD-10-CM | POA: Diagnosis not present

## 2020-06-08 DIAGNOSIS — R102 Pelvic and perineal pain: Secondary | ICD-10-CM | POA: Diagnosis not present

## 2020-06-15 DIAGNOSIS — M6281 Muscle weakness (generalized): Secondary | ICD-10-CM | POA: Diagnosis not present

## 2020-06-15 DIAGNOSIS — R3915 Urgency of urination: Secondary | ICD-10-CM | POA: Diagnosis not present

## 2020-06-15 DIAGNOSIS — M6289 Other specified disorders of muscle: Secondary | ICD-10-CM | POA: Diagnosis not present

## 2020-06-15 DIAGNOSIS — M62838 Other muscle spasm: Secondary | ICD-10-CM | POA: Diagnosis not present

## 2020-06-22 DIAGNOSIS — R3915 Urgency of urination: Secondary | ICD-10-CM | POA: Diagnosis not present

## 2020-06-22 DIAGNOSIS — M62838 Other muscle spasm: Secondary | ICD-10-CM | POA: Diagnosis not present

## 2020-06-22 DIAGNOSIS — M6281 Muscle weakness (generalized): Secondary | ICD-10-CM | POA: Diagnosis not present

## 2020-06-22 DIAGNOSIS — M6289 Other specified disorders of muscle: Secondary | ICD-10-CM | POA: Diagnosis not present

## 2020-07-05 DIAGNOSIS — M6281 Muscle weakness (generalized): Secondary | ICD-10-CM | POA: Diagnosis not present

## 2020-07-05 DIAGNOSIS — M6289 Other specified disorders of muscle: Secondary | ICD-10-CM | POA: Diagnosis not present

## 2020-07-05 DIAGNOSIS — R3915 Urgency of urination: Secondary | ICD-10-CM | POA: Diagnosis not present

## 2020-07-05 DIAGNOSIS — M62838 Other muscle spasm: Secondary | ICD-10-CM | POA: Diagnosis not present

## 2020-07-13 DIAGNOSIS — R3915 Urgency of urination: Secondary | ICD-10-CM | POA: Diagnosis not present

## 2020-07-13 DIAGNOSIS — M6281 Muscle weakness (generalized): Secondary | ICD-10-CM | POA: Diagnosis not present

## 2020-07-13 DIAGNOSIS — M62838 Other muscle spasm: Secondary | ICD-10-CM | POA: Diagnosis not present

## 2020-07-13 DIAGNOSIS — M6289 Other specified disorders of muscle: Secondary | ICD-10-CM | POA: Diagnosis not present

## 2020-07-27 DIAGNOSIS — M6281 Muscle weakness (generalized): Secondary | ICD-10-CM | POA: Diagnosis not present

## 2020-07-27 DIAGNOSIS — M6289 Other specified disorders of muscle: Secondary | ICD-10-CM | POA: Diagnosis not present

## 2020-07-27 DIAGNOSIS — M62838 Other muscle spasm: Secondary | ICD-10-CM | POA: Diagnosis not present

## 2020-07-27 DIAGNOSIS — R3915 Urgency of urination: Secondary | ICD-10-CM | POA: Diagnosis not present

## 2020-08-03 DIAGNOSIS — R3915 Urgency of urination: Secondary | ICD-10-CM | POA: Diagnosis not present

## 2020-08-03 DIAGNOSIS — M6289 Other specified disorders of muscle: Secondary | ICD-10-CM | POA: Diagnosis not present

## 2020-08-03 DIAGNOSIS — M62838 Other muscle spasm: Secondary | ICD-10-CM | POA: Diagnosis not present

## 2020-08-03 DIAGNOSIS — M6281 Muscle weakness (generalized): Secondary | ICD-10-CM | POA: Diagnosis not present

## 2020-08-31 DIAGNOSIS — M6289 Other specified disorders of muscle: Secondary | ICD-10-CM | POA: Diagnosis not present

## 2020-08-31 DIAGNOSIS — M62838 Other muscle spasm: Secondary | ICD-10-CM | POA: Diagnosis not present

## 2020-08-31 DIAGNOSIS — M6281 Muscle weakness (generalized): Secondary | ICD-10-CM | POA: Diagnosis not present

## 2020-08-31 DIAGNOSIS — R3915 Urgency of urination: Secondary | ICD-10-CM | POA: Diagnosis not present

## 2020-09-13 DIAGNOSIS — H524 Presbyopia: Secondary | ICD-10-CM | POA: Diagnosis not present

## 2020-09-14 DIAGNOSIS — R3915 Urgency of urination: Secondary | ICD-10-CM | POA: Diagnosis not present

## 2020-09-14 DIAGNOSIS — M62838 Other muscle spasm: Secondary | ICD-10-CM | POA: Diagnosis not present

## 2020-09-14 DIAGNOSIS — M6289 Other specified disorders of muscle: Secondary | ICD-10-CM | POA: Diagnosis not present

## 2020-09-14 DIAGNOSIS — M6281 Muscle weakness (generalized): Secondary | ICD-10-CM | POA: Diagnosis not present

## 2020-09-30 DIAGNOSIS — M6281 Muscle weakness (generalized): Secondary | ICD-10-CM | POA: Diagnosis not present

## 2020-09-30 DIAGNOSIS — M6289 Other specified disorders of muscle: Secondary | ICD-10-CM | POA: Diagnosis not present

## 2020-09-30 DIAGNOSIS — R3915 Urgency of urination: Secondary | ICD-10-CM | POA: Diagnosis not present

## 2020-09-30 DIAGNOSIS — M62838 Other muscle spasm: Secondary | ICD-10-CM | POA: Diagnosis not present

## 2020-10-26 DIAGNOSIS — R3915 Urgency of urination: Secondary | ICD-10-CM | POA: Diagnosis not present

## 2020-10-26 DIAGNOSIS — M6281 Muscle weakness (generalized): Secondary | ICD-10-CM | POA: Diagnosis not present

## 2020-10-26 DIAGNOSIS — M62838 Other muscle spasm: Secondary | ICD-10-CM | POA: Diagnosis not present

## 2020-10-26 DIAGNOSIS — M6289 Other specified disorders of muscle: Secondary | ICD-10-CM | POA: Diagnosis not present

## 2020-10-28 DIAGNOSIS — Z86010 Personal history of colonic polyps: Secondary | ICD-10-CM | POA: Diagnosis not present

## 2020-10-28 DIAGNOSIS — Z1211 Encounter for screening for malignant neoplasm of colon: Secondary | ICD-10-CM | POA: Diagnosis not present

## 2020-10-28 DIAGNOSIS — K5730 Diverticulosis of large intestine without perforation or abscess without bleeding: Secondary | ICD-10-CM | POA: Diagnosis not present

## 2020-10-28 DIAGNOSIS — Z85038 Personal history of other malignant neoplasm of large intestine: Secondary | ICD-10-CM | POA: Diagnosis not present

## 2020-11-01 ENCOUNTER — Other Ambulatory Visit: Payer: Self-pay | Admitting: Internal Medicine

## 2020-11-01 DIAGNOSIS — Z1231 Encounter for screening mammogram for malignant neoplasm of breast: Secondary | ICD-10-CM

## 2020-11-10 DIAGNOSIS — Z85038 Personal history of other malignant neoplasm of large intestine: Secondary | ICD-10-CM | POA: Diagnosis not present

## 2020-11-10 DIAGNOSIS — K5730 Diverticulosis of large intestine without perforation or abscess without bleeding: Secondary | ICD-10-CM | POA: Diagnosis not present

## 2020-11-10 DIAGNOSIS — Z1211 Encounter for screening for malignant neoplasm of colon: Secondary | ICD-10-CM | POA: Diagnosis not present

## 2020-11-10 DIAGNOSIS — Z86010 Personal history of colonic polyps: Secondary | ICD-10-CM | POA: Diagnosis not present

## 2020-11-15 ENCOUNTER — Other Ambulatory Visit: Payer: Self-pay | Admitting: Gastroenterology

## 2020-11-15 DIAGNOSIS — Q438 Other specified congenital malformations of intestine: Secondary | ICD-10-CM

## 2020-11-15 DIAGNOSIS — M62838 Other muscle spasm: Secondary | ICD-10-CM | POA: Diagnosis not present

## 2020-11-15 DIAGNOSIS — R3915 Urgency of urination: Secondary | ICD-10-CM | POA: Diagnosis not present

## 2020-11-15 DIAGNOSIS — M6281 Muscle weakness (generalized): Secondary | ICD-10-CM | POA: Diagnosis not present

## 2020-11-15 DIAGNOSIS — M6289 Other specified disorders of muscle: Secondary | ICD-10-CM | POA: Diagnosis not present

## 2020-11-17 ENCOUNTER — Ambulatory Visit
Admission: RE | Admit: 2020-11-17 | Discharge: 2020-11-17 | Disposition: A | Discharge status: Home / Self Care | Payer: Medicare Other | Source: Ambulatory Visit | Attending: Internal Medicine | Admitting: Internal Medicine

## 2020-11-17 ENCOUNTER — Other Ambulatory Visit: Payer: Self-pay

## 2020-11-17 DIAGNOSIS — Z1231 Encounter for screening mammogram for malignant neoplasm of breast: Secondary | ICD-10-CM | POA: Diagnosis not present

## 2020-12-07 DIAGNOSIS — M6281 Muscle weakness (generalized): Secondary | ICD-10-CM | POA: Diagnosis not present

## 2020-12-07 DIAGNOSIS — R3915 Urgency of urination: Secondary | ICD-10-CM | POA: Diagnosis not present

## 2020-12-07 DIAGNOSIS — M62838 Other muscle spasm: Secondary | ICD-10-CM | POA: Diagnosis not present

## 2020-12-07 DIAGNOSIS — M6289 Other specified disorders of muscle: Secondary | ICD-10-CM | POA: Diagnosis not present

## 2020-12-10 ENCOUNTER — Ambulatory Visit
Admission: RE | Admit: 2020-12-10 | Discharge: 2020-12-10 | Disposition: A | Discharge status: Home / Self Care | Payer: Medicare Other | Source: Ambulatory Visit | Attending: Gastroenterology | Admitting: Gastroenterology

## 2020-12-10 DIAGNOSIS — K7689 Other specified diseases of liver: Secondary | ICD-10-CM | POA: Diagnosis not present

## 2020-12-10 DIAGNOSIS — K6389 Other specified diseases of intestine: Secondary | ICD-10-CM | POA: Diagnosis not present

## 2020-12-10 DIAGNOSIS — Q438 Other specified congenital malformations of intestine: Secondary | ICD-10-CM

## 2020-12-10 DIAGNOSIS — M47816 Spondylosis without myelopathy or radiculopathy, lumbar region: Secondary | ICD-10-CM | POA: Diagnosis not present

## 2020-12-10 DIAGNOSIS — K3189 Other diseases of stomach and duodenum: Secondary | ICD-10-CM | POA: Diagnosis not present

## 2020-12-13 ENCOUNTER — Other Ambulatory Visit: Payer: Self-pay | Admitting: Gastroenterology

## 2020-12-16 ENCOUNTER — Other Ambulatory Visit: Payer: Self-pay

## 2020-12-16 ENCOUNTER — Encounter (HOSPITAL_COMMUNITY): Payer: Self-pay | Admitting: Gastroenterology

## 2020-12-21 ENCOUNTER — Encounter (HOSPITAL_COMMUNITY)
Admission: RE | Disposition: A | Discharge status: Home / Self Care | Payer: Self-pay | Source: Home / Self Care | Attending: Gastroenterology

## 2020-12-21 ENCOUNTER — Ambulatory Visit (HOSPITAL_COMMUNITY): Payer: Medicare Other | Admitting: Certified Registered Nurse Anesthetist

## 2020-12-21 ENCOUNTER — Other Ambulatory Visit: Payer: Self-pay

## 2020-12-21 ENCOUNTER — Ambulatory Visit (HOSPITAL_COMMUNITY)
Admission: RE | Admit: 2020-12-21 | Discharge: 2020-12-21 | Disposition: A | Discharge status: Home / Self Care | Payer: Medicare Other | Attending: Gastroenterology | Admitting: Gastroenterology

## 2020-12-21 DIAGNOSIS — Z87891 Personal history of nicotine dependence: Secondary | ICD-10-CM | POA: Insufficient documentation

## 2020-12-21 DIAGNOSIS — Z885 Allergy status to narcotic agent status: Secondary | ICD-10-CM | POA: Diagnosis not present

## 2020-12-21 DIAGNOSIS — Z85038 Personal history of other malignant neoplasm of large intestine: Secondary | ICD-10-CM | POA: Diagnosis not present

## 2020-12-21 DIAGNOSIS — K3189 Other diseases of stomach and duodenum: Secondary | ICD-10-CM | POA: Insufficient documentation

## 2020-12-21 DIAGNOSIS — Z800 Family history of malignant neoplasm of digestive organs: Secondary | ICD-10-CM | POA: Diagnosis not present

## 2020-12-21 DIAGNOSIS — R933 Abnormal findings on diagnostic imaging of other parts of digestive tract: Secondary | ICD-10-CM | POA: Diagnosis not present

## 2020-12-21 DIAGNOSIS — E039 Hypothyroidism, unspecified: Secondary | ICD-10-CM | POA: Insufficient documentation

## 2020-12-21 DIAGNOSIS — K8689 Other specified diseases of pancreas: Secondary | ICD-10-CM | POA: Diagnosis not present

## 2020-12-21 DIAGNOSIS — K766 Portal hypertension: Secondary | ICD-10-CM | POA: Diagnosis not present

## 2020-12-21 DIAGNOSIS — Z881 Allergy status to other antibiotic agents status: Secondary | ICD-10-CM | POA: Insufficient documentation

## 2020-12-21 DIAGNOSIS — I8510 Secondary esophageal varices without bleeding: Secondary | ICD-10-CM | POA: Diagnosis not present

## 2020-12-21 HISTORY — PX: ESOPHAGOGASTRODUODENOSCOPY (EGD) WITH PROPOFOL: SHX5813

## 2020-12-21 HISTORY — PX: EUS: SHX5427

## 2020-12-21 SURGERY — ESOPHAGOGASTRODUODENOSCOPY (EGD) WITH PROPOFOL
Anesthesia: Monitor Anesthesia Care

## 2020-12-21 MED ORDER — LACTATED RINGERS IV SOLN
INTRAVENOUS | Status: DC
Start: 2020-12-21 — End: 2020-12-23

## 2020-12-21 MED ORDER — PROPOFOL 500 MG/50ML IV EMUL
INTRAVENOUS | Status: DC | PRN
  Administered 2020-12-21: 50 mg via INTRAVENOUS
  Administered 2020-12-21: 175 ug/kg/min via INTRAVENOUS

## 2020-12-21 MED ORDER — SODIUM CHLORIDE 0.9 % IV SOLN: INTRAVENOUS | Status: DC

## 2020-12-21 SURGICAL SUPPLY — 15 items

## 2020-12-21 NOTE — Anesthesia Postprocedure Evaluation (Signed)
Anesthesia Post Note  Patient: Leslie Rivera  Procedure(s) Performed: ESOPHAGOGASTRODUODENOSCOPY (EGD) WITH PROPOFOL UPPER ENDOSCOPIC ULTRASOUND (EUS) LINEAR     Patient location during evaluation: PACU Anesthesia Type: MAC Level of consciousness: awake and alert Pain management: pain level controlled Vital Signs Assessment: post-procedure vital signs reviewed and stable Respiratory status: spontaneous breathing, nonlabored ventilation, respiratory function stable and patient connected to nasal cannula oxygen Cardiovascular status: stable and blood pressure returned to baseline Postop Assessment: no apparent nausea or vomiting Anesthetic complications: no   No notable events documented.  Last Vitals:  Vitals:   12/21/20 1316 12/21/20 1457  BP: 137/87 130/66  Pulse: 78 91  Resp: 15 20  Temp: 36.9 C 36.4 C  SpO2: 100% 100%    Last Pain:  Vitals:   12/21/20 1457  TempSrc: Axillary  PainSc: 0-No pain                 Ramari Bray S

## 2020-12-21 NOTE — Anesthesia Preprocedure Evaluation (Signed)
Anesthesia Evaluation  Patient identified by MRN, date of birth, ID band Patient awake    Reviewed: Allergy & Precautions, NPO status , Patient's Chart, lab work & pertinent test results  Airway Mallampati: II  TM Distance: >3 FB Neck ROM: Full    Dental no notable dental hx.    Pulmonary neg pulmonary ROS, former smoker,    Pulmonary exam normal breath sounds clear to auscultation       Cardiovascular negative cardio ROS Normal cardiovascular exam Rhythm:Regular Rate:Normal     Neuro/Psych negative neurological ROS  negative psych ROS   GI/Hepatic negative GI ROS, Neg liver ROS,   Endo/Other  Hypothyroidism   Renal/GU negative Renal ROS  negative genitourinary   Musculoskeletal negative musculoskeletal ROS (+)   Abdominal   Peds negative pediatric ROS (+)  Hematology negative hematology ROS (+)   Anesthesia Other Findings   Reproductive/Obstetrics negative OB ROS                             Anesthesia Physical Anesthesia Plan  ASA: 2  Anesthesia Plan: MAC   Post-op Pain Management:    Induction: Intravenous  PONV Risk Score and Plan: 2 and Propofol infusion and Treatment may vary due to age or medical condition  Airway Management Planned: Nasal Cannula  Additional Equipment:   Intra-op Plan:   Post-operative Plan:   Informed Consent: I have reviewed the patients History and Physical, chart, labs and discussed the procedure including the risks, benefits and alternatives for the proposed anesthesia with the patient or authorized representative who has indicated his/her understanding and acceptance.     Dental advisory given  Plan Discussed with: CRNA and Surgeon  Anesthesia Plan Comments:         Anesthesia Quick Evaluation

## 2020-12-21 NOTE — Op Note (Signed)
[facility] Patient Name: Leslie Rivera Procedure Date: 12/21/2020 MRN: [id number] Attending MD: Carol Ada , MD Date of Birth: [birth year] CSN: [id number] Age: 75 Admit Type: Outpatient Procedure:                Upper GI endoscopy Indications:              Abnormal CT of the GI tract Providers:                Carol Ada, MD, Josie Dixon, RN, Cletis Athens,                            Technician Referring MD:              Medicines:                Propofol per Anesthesia Complications:            No immediate complications. Estimated Blood Loss:     Estimated blood loss: none. Procedure:                Pre-Anesthesia Assessment:                           - Prior to the procedure, a History and Physical                            was performed, and patient medications and                            allergies were reviewed. [name] tolerance of                            previous anesthesia was also reviewed. The risks                            and benefits of the procedure and the sedation                            options and risks were discussed with the patient.                            All questions were answered, and informed consent                            was obtained. Prior Anticoagulants: The patient has                            taken no previous anticoagulant or antiplatelet                            agents. ASA Grade Assessment: III - A patient with                            severe systemic disease. After reviewing the risks  and benefits, the patient was deemed in                            satisfactory condition to undergo the procedure.                           - Sedation was administered by an anesthesia                            professional. Deep sedation was attained.                           After obtaining informed consent, the endoscope was                            passed under direct vision.  Throughout the                            procedure, the patient's blood pressure, pulse, and                            oxygen saturations were monitored continuously. The                            GIF-H190 ([id number] Olympus gastroscope was                            introduced through the mouth, and advanced to the                            second part of duodenum. The upper GI endoscopy was                            accomplished without difficulty. The patient                            tolerated the procedure well. Scope In: Scope Out: Findings:      The esophagus was normal.      Mild portal hypertensive gastropathy was found in the gastric fundus and       in the gastric body.      The examined duodenum was normal.      The examination of the GE junction was normal. There was no evidence of       any mucosal lesions or extrinsic compression. The mucosa of the gastric       lumen showed portal HTN gastropathy. There was no evidence of any       esophageal or gastric varices. With the gross findings, consent was       obtained form her sister to convert the procedure to an EUS. EUS images       were not possible in endo room #2. With the linear EUS examination the       GE junction was normal as well as the celiac axis, portal vein, left       adrenal, left lobe of the liver parenchyma, PD, gallbladder, and CBD. In  the tail of the pancreas there was the possibility of a mass lesion, but       it was not clear. It was difficult to locate at times. Because of the       proximity of this lesion with the GE junction region, this may be the       lesion noted on the virtual CT scan. Further evaluation showed that       there was evidence of of ascites in the area as well as a mild sliver of       perihepatic ascities. The left lobe liver edge did not appear to be       nodular, in fact, it was smooth appearing. In the head of the pancreas,       a presumed peripancreatic lymph  node measuring 12 mm was identified. Impression:               - Normal esophagus.                           - Portal hypertensive gastropathy.                           - Normal examined duodenum.                           - No specimens collected. Moderate Sedation:      Not Applicable - Patient had care per Anesthesia. Recommendation:           - Patient has a contact number available for                            emergencies. The signs and symptoms of potential                            delayed complications were discussed with the                            patient. Return to normal activities tomorrow.                            Written discharge instructions were provided to the                            patient.                           - Resume regular diet.                           - Continue present medications.                           - CT guided biopsy. Procedure Code(s):        --- Professional ---                           [phone], Esophagogastroduodenoscopy, flexible,  transoral; diagnostic, including collection of                            specimen(s) by brushing or washing, when performed                            (separate procedure) Diagnosis Code(s):        --- Professional ---                           R93.3, Abnormal findings on diagnostic imaging of                            other parts of digestive tract                           K76.6, Portal hypertension                           K31.89, Other diseases of stomach and duodenum CPT copyright 2019 [employer] Association. All rights reserved. The codes documented in this report are preliminary and upon coder review may  be revised to meet current compliance requirements. Carol Ada, MD Carol Ada, MD 12/21/2020 3:07:26 PM This report has been signed electronically. Number of Addenda: 0

## 2020-12-21 NOTE — Transfer of Care (Signed)
Immediate Anesthesia Transfer of Care Note  Patient: Leslie Rivera  Procedure(s) Performed: Procedure(s): ESOPHAGOGASTRODUODENOSCOPY (EGD) WITH PROPOFOL (N/A) UPPER ENDOSCOPIC ULTRASOUND (EUS) LINEAR (N/A)  Patient Location: PACU and Endoscopy Unit  Anesthesia Type:MAC  Level of Consciousness: awake, alert  and oriented  Airway & Oxygen Therapy: Patient Spontanous Breathing and Patient connected to nasal cannula oxygen  Post-op Assessment: Report given to RN and Post -op Vital signs reviewed and stable  Post vital signs: Reviewed and stable  Last Vitals:  Vitals:   12/21/20 1316  BP: 137/87  Pulse: 78  Resp: 15  Temp: 36.9 C  SpO2: 607%    Complications: No apparent anesthesia complications

## 2020-12-21 NOTE — Discharge Instructions (Signed)

## 2020-12-21 NOTE — H&P (Signed)
Leslie Rivera HPI: A virtual colonoscopy was performed as the colonoscopy was incomplete.  The patient has a history of colon cancer.  An incidental 2.3 cm mass at the GE junction was identified, but it was not clear if the mass was mucosal or peritoneal.  A nodular liver was identified, suggestive of cirrhosis, and there was evidence of peritoneal implants.  Past Medical History:  Diagnosis Date   Abdominal pain    Adenomatous colon polyp    Anemia    Iron deficiency anemia   Anxiety    Cancer (HCC)    colon   Colon cancer ([place]) 07/17/2013   History of migraine headaches    Hypothyroidism    Internal hemorrhoids    Irregular heart beat    occ. PVC's   Periumbilical pain     Past Surgical History:  Procedure Laterality Date   BREAST BIOPSY Right    BREAST SURGERY     removal abnormal cells in right   LAPAROSCOPIC RIGHT COLECTOMY Right 07/17/2013   Procedure: LAPAROSCOPIC ASSISTED  RIGHT COLECTOMY;  Surgeon: Odis Hollingshead, MD;  Location: [facility] ORS;  Service: General;  Laterality: Right;   TONSILLECTOMY     teenager- age 44    Family History  Problem Relation Age of Onset   Cancer Brother        internal melanoma   Cancer Maternal Grandmother        colon    Social History:  reports that she quit smoking about 23 years ago. She has never used smokeless tobacco. She reports current alcohol use. She reports that she does not use drugs.  Allergies:  Allergies  Allergen Reactions   Hydrocodone Nausea And Vomiting    Can tolerate Oxycodone   Erythromycin     GI upset    Medications: Scheduled: Continuous:  sodium chloride     lactated ringers 20 mL/hr at 12/21/20 1336    No results found for this or any previous visit (from the past 24 hour(s)).   No results found.  ROS:  As stated above in the HPI otherwise negative.  Blood pressure 137/87, pulse 78, temperature 98.5 F (36.9 C), temperature source Oral, resp. rate 15, height 5\' 6"  (1.676 m), weight 64.9  kg, SpO2 100 %.    PE: Gen: NAD, Alert and Oriented HEENT:  [place]/AT, EOMI Neck: Supple, no LAD Lungs: CTA Bilaterally CV: RRR without M/G/R ABD: Soft, NTND, +BS Ext: No C/C/E  Assessment/Plan: 1) GE junction mass - EGD.  Leslie Rivera D 12/21/2020, 2:08 PM

## 2020-12-23 ENCOUNTER — Encounter (HOSPITAL_COMMUNITY): Payer: Self-pay | Admitting: Gastroenterology

## 2020-12-29 ENCOUNTER — Other Ambulatory Visit (HOSPITAL_COMMUNITY): Payer: Self-pay | Admitting: Gastroenterology

## 2020-12-29 ENCOUNTER — Telehealth (HOSPITAL_COMMUNITY): Payer: Self-pay

## 2020-12-29 ENCOUNTER — Encounter (HOSPITAL_COMMUNITY): Payer: Self-pay | Admitting: Radiology

## 2020-12-29 DIAGNOSIS — R9389 Abnormal findings on diagnostic imaging of other specified body structures: Secondary | ICD-10-CM

## 2020-12-29 NOTE — Progress Notes (Signed)
Leslie Rivera  Pait, Leslie Rivera, Leslie Rivera,   Can you please schedule? The order is scanned into epic.   Thanks,  Leslie Rivera         Previous Messages    ----- Message -----  From: Leslie Daft, MD  Sent: 12/29/2020  11:00 AM EDT  To: Leslie Rivera  Subject: RE: Peritoneal Biopsy                           Ok for CT guided core biopsy of peritoneal disease in anterior lower abdomen.   Henn  ----- Message -----  From: Leslie Rivera  Sent: 12/29/2020  10:14 AM EDT  To: Ir Procedure Requests  Subject: Peritoneal Biopsy                               Procedure: Peritoneal biopsy   Dx: Abnormal CT findings   Imaging: CT virtual colonoscopy done 12/10/20 in epic   Ordering: Dr. Juanita Craver [phone])   Please review.   Thanks,  Leslie Rivera

## 2020-12-29 NOTE — Telephone Encounter (Signed)
-----   Message from Markus Daft, MD sent at 12/29/2020 11:00 AM EDT ----- Regarding: RE: Peritoneal Biopsy Ok for CT guided core biopsy of peritoneal disease in anterior lower abdomen.  Henn ----- Message ----- From: Danielle Dess Sent: 12/29/2020  10:14 AM EDT To: Ir Procedure Requests Subject: Peritoneal Biopsy                              Procedure: Peritoneal biopsy  Dx: Abnormal CT findings  Imaging: CT virtual colonoscopy done 12/10/20 in epic  Ordering: Dr. Juanita Craver [phone])  Please review.   Thanks,  Lia Foyer

## 2021-01-03 DIAGNOSIS — Z0000 Encounter for general adult medical examination without abnormal findings: Secondary | ICD-10-CM | POA: Diagnosis not present

## 2021-01-03 DIAGNOSIS — E7800 Pure hypercholesterolemia, unspecified: Secondary | ICD-10-CM | POA: Diagnosis not present

## 2021-01-03 DIAGNOSIS — E039 Hypothyroidism, unspecified: Secondary | ICD-10-CM | POA: Diagnosis not present

## 2021-01-03 DIAGNOSIS — C189 Malignant neoplasm of colon, unspecified: Secondary | ICD-10-CM | POA: Diagnosis not present

## 2021-01-04 ENCOUNTER — Other Ambulatory Visit: Payer: Self-pay | Admitting: Radiology

## 2021-01-04 DIAGNOSIS — E875 Hyperkalemia: Secondary | ICD-10-CM | POA: Diagnosis not present

## 2021-01-05 ENCOUNTER — Other Ambulatory Visit: Payer: Self-pay

## 2021-01-05 ENCOUNTER — Encounter (HOSPITAL_COMMUNITY): Payer: Self-pay

## 2021-01-05 ENCOUNTER — Ambulatory Visit (HOSPITAL_COMMUNITY)
Admission: RE | Admit: 2021-01-05 | Discharge: 2021-01-05 | Disposition: A | Discharge status: Home / Self Care | Payer: Medicare Other | Source: Ambulatory Visit | Attending: Gastroenterology | Admitting: Gastroenterology

## 2021-01-05 DIAGNOSIS — Z9049 Acquired absence of other specified parts of digestive tract: Secondary | ICD-10-CM | POA: Diagnosis not present

## 2021-01-05 DIAGNOSIS — Z79899 Other long term (current) drug therapy: Secondary | ICD-10-CM | POA: Insufficient documentation

## 2021-01-05 DIAGNOSIS — Z87891 Personal history of nicotine dependence: Secondary | ICD-10-CM | POA: Diagnosis not present

## 2021-01-05 DIAGNOSIS — C786 Secondary malignant neoplasm of retroperitoneum and peritoneum: Secondary | ICD-10-CM | POA: Insufficient documentation

## 2021-01-05 DIAGNOSIS — R9389 Abnormal findings on diagnostic imaging of other specified body structures: Secondary | ICD-10-CM

## 2021-01-05 DIAGNOSIS — K668 Other specified disorders of peritoneum: Secondary | ICD-10-CM | POA: Diagnosis not present

## 2021-01-05 DIAGNOSIS — Z85038 Personal history of other malignant neoplasm of large intestine: Secondary | ICD-10-CM | POA: Diagnosis not present

## 2021-01-05 LAB — CBC WITH DIFFERENTIAL/PLATELET
Abs Immature Granulocytes: 0.03 10*3/uL (ref 0.00–0.07)
Basophils Absolute: 0 10*3/uL (ref 0.0–0.1)
Basophils Relative: 1 %
Eosinophils Absolute: 0.1 10*3/uL (ref 0.0–0.5)
Eosinophils Relative: 2 %
HCT: 38.8 % (ref 36.0–46.0)
Hemoglobin: 12.2 g/dL (ref 12.0–15.0)
Immature Granulocytes: 1 %
Lymphocytes Relative: 33 %
Lymphs Abs: 1.8 10*3/uL (ref 0.7–4.0)
MCH: 30.1 pg (ref 26.0–34.0)
MCHC: 31.4 g/dL (ref 30.0–36.0)
MCV: 95.8 fL (ref 80.0–100.0)
Monocytes Absolute: 0.5 10*3/uL (ref 0.1–1.0)
Monocytes Relative: 9 %
Neutro Abs: 2.9 10*3/uL (ref 1.7–7.7)
Neutrophils Relative %: 54 %
Platelets: 363 10*3/uL (ref 150–400)
RBC: 4.05 MIL/uL (ref 3.87–5.11)
RDW: 13.8 % (ref 11.5–15.5)
WBC: 5.3 10*3/uL (ref 4.0–10.5)
nRBC: 0 % (ref 0.0–0.2)

## 2021-01-05 LAB — BASIC METABOLIC PANEL
Anion gap: 6 (ref 5–15)
BUN: 19 mg/dL (ref 8–23)
CO2: 23 mmol/L (ref 22–32)
Calcium: 9.5 mg/dL (ref 8.9–10.3)
Chloride: 108 mmol/L (ref 98–111)
Creatinine, Ser: 0.61 mg/dL (ref 0.44–1.00)
GFR, Estimated: >60 mL/min (ref >60)
Glucose, Bld: 89 mg/dL (ref 70–99)
Potassium: 4.2 mmol/L (ref 3.5–5.1)
Sodium: 137 mmol/L (ref 135–145)

## 2021-01-05 LAB — PROTIME-INR
INR: 1 (ref 0.8–1.2)
Prothrombin Time: 13.2 seconds (ref 11.4–15.2)

## 2021-01-05 MED ORDER — MIDAZOLAM HCL 2 MG/2ML IJ SOLN
INTRAMUSCULAR | Status: AC
  Filled 2021-01-05: qty 2

## 2021-01-05 MED ORDER — MIDAZOLAM HCL 2 MG/2ML IJ SOLN
INTRAMUSCULAR | Status: AC
  Filled 2021-01-05: qty 4

## 2021-01-05 MED ORDER — MIDAZOLAM HCL 2 MG/2ML IJ SOLN
INTRAMUSCULAR | Status: DC | PRN
  Administered 2021-01-05 (×3): 1 mg via INTRAVENOUS

## 2021-01-05 MED ORDER — GELATIN ABSORBABLE 12-7 MM EX MISC
CUTANEOUS | Status: AC
  Filled 2021-01-05: qty 1

## 2021-01-05 MED ORDER — SODIUM CHLORIDE 0.9 % IV SOLN: INTRAVENOUS | Status: DC

## 2021-01-05 MED ORDER — FENTANYL CITRATE (PF) 100 MCG/2ML IJ SOLN
INTRAMUSCULAR | Status: DC | PRN
  Administered 2021-01-05 (×2): 50 ug via INTRAVENOUS

## 2021-01-05 MED ORDER — FENTANYL CITRATE (PF) 100 MCG/2ML IJ SOLN
INTRAMUSCULAR | Status: AC
  Filled 2021-01-05: qty 4

## 2021-01-05 NOTE — Progress Notes (Signed)
Pt ambulated without difficulty or bleeding.   Discharged home with sister who will drive and stay with pt x 24 hrs 

## 2021-01-05 NOTE — Discharge Instructions (Signed)

## 2021-01-05 NOTE — Sedation Documentation (Signed)
SBAR called to [employer], Therapist, sports. All questions answered to satisfaction.

## 2021-01-05 NOTE — Procedures (Signed)
Pre procedural Dx: Omental thickening Post procedural Dx: Same  Technically successful CT guided biopsy of omental thickening within the ventral aspect of the right lower abdomen/upper pelvis.   EBL: None.  Complications: None immediate.   Ronny Bacon, MD Pager #: [phone]

## 2021-01-05 NOTE — H&P (Signed)
Chief Complaint: Patient was seen in consultation today for image guided biopsy of peritoneal disease at the request of Mann,Jyothi  Referring Physician(s): Mann,Jyothi  Supervising Physician: Sandi Mariscal  Patient Status: [facility] - Out-pt  History of Present Illness: Leslie Rivera is a 75 y.o. female with past medical history of IDA, migraine, hypothyroidism, colon cancer s/p right colectomy with Dr. Zella Richer in 2015, who underwent virtual colonoscopy screening with Dr. Collene Mares on 12/10/2020 which showed 2.4 soft tissue lesion at the GE junction, underwent EGD with Dr. Benson Norway on 12/21/2020 which showed normal esophagus and duodenum.   IR was requested for peritoneal biopsy.  Case was reviewed and approved for a CT-guided core biopsy of peritoneal disease and anterior lower abdomen by Dr. Anselm Pancoast. Patient presents to [facility] IR today for the procedure.  Patient laying in bed, not in acute distress.  Denise headache, fever, chills, shortness of breath, cough, chest pain, abdominal pain, nausea ,vomiting, and bleeding.   Past Medical History:  Diagnosis Date   Abdominal pain    Adenomatous colon polyp    Anemia    Iron deficiency anemia   Anxiety    Cancer (HCC)    colon   Colon cancer ([place]) 07/17/2013   History of migraine headaches    Hypothyroidism    Internal hemorrhoids    Irregular heart beat    occ. PVC's   Periumbilical pain     Past Surgical History:  Procedure Laterality Date   BREAST BIOPSY Right    BREAST SURGERY     removal abnormal cells in right   ESOPHAGOGASTRODUODENOSCOPY (EGD) WITH PROPOFOL N/A 12/21/2020   Procedure: ESOPHAGOGASTRODUODENOSCOPY (EGD) WITH PROPOFOL;  Surgeon: Carol Ada, MD;  Location: [facility] ENDOSCOPY;  Service: Endoscopy;  Laterality: N/A;   EUS N/A 12/21/2020   Procedure: UPPER ENDOSCOPIC ULTRASOUND (EUS) LINEAR;  Surgeon: Carol Ada, MD;  Location: [facility] ENDOSCOPY;  Service: Endoscopy;  Laterality: N/A;   LAPAROSCOPIC RIGHT COLECTOMY Right  07/17/2013   Procedure: LAPAROSCOPIC ASSISTED  RIGHT COLECTOMY;  Surgeon: Odis Hollingshead, MD;  Location: [facility] ORS;  Service: General;  Laterality: Right;   TONSILLECTOMY     teenager- age 11    Allergies: Hydrocodone and Erythromycin  Medications: Prior to Admission medications   Medication Sig Start Date End Date Taking? Authorizing Provider  Artificial Tear Ointment (LUBRICANT EYE OP) Place 1-3 drops into both eyes 2 (two) times daily. Systain   Yes [provider]  calcium carbonate (OS-CAL) 600 MG TABS tablet Take 600 mg by mouth 2 (two) times daily with a meal.   Yes [provider]  cetirizine (ZYRTEC) 10 MG tablet Take 10 mg by mouth every 30 (thirty) days.   Yes [provider]  Cholecalciferol (VITAMIN D) 2000 units CAPS Take 2,000 Units by mouth daily.   Yes [provider]  diazepam (VALIUM) 5 MG tablet Take 5 mg by mouth every 8 (eight) hours as needed for anxiety.  03/26/13  Yes [provider]  escitalopram (LEXAPRO) 10 MG tablet Take 10 mg by mouth daily. 12/04/20  Yes [provider]  levothyroxine (SYNTHROID, LEVOTHROID) 75 MCG tablet Take 75 mcg by mouth daily. 04/27/15  Yes [provider]  Probiotic Product (PROBIOTIC DAILY PO) Take 1 capsule by mouth daily. Ultra Flora IB   Yes [provider]  valACYclovir (VALTREX) 1000 MG tablet Take 2,000 mg by mouth daily as needed (Fever Blister). 09/28/20  Yes [provider]  zolpidem (AMBIEN) 10 MG tablet Take 5-10 mg by  mouth at bedtime as needed for sleep.   Yes [provider]     Family History  Problem Relation Age of Onset   Cancer Brother        internal melanoma   Cancer Maternal Grandmother        colon    Social History   Socioeconomic History   Marital status: Divorced    Spouse name: Not on file   Number of children: Not on file   Years of education: Not on file   Highest education level: Not on file  Occupational  History   Occupation: owner of [employer]  Tobacco Use   Smoking status: Former    Pack years: 0.00    Types: Cigarettes    Quit date: 06/11/1997    Years since quitting: 23.5   Smokeless tobacco: Never  Substance and Sexual Activity   Alcohol use: Yes    Alcohol/week: 0.0 standard drinks    Comment: 2 ounces daily - Scotch   Drug use: No   Sexual activity: Not Currently  Other Topics Concern   Not on file  Social History Narrative   Not on file   Social Determinants of Health   Financial Resource Strain: Not on file  Food Insecurity: Not on file  Transportation Needs: Not on file  Physical Activity: Not on file  Stress: Not on file  Social Connections: Not on file     Review of Systems: A 12 point ROS discussed and pertinent positives are indicated in the HPI above.  All other systems are negative.   Vital Signs: BP 116/80 (BP Location: Right Arm)   Pulse 65   Temp 97.9 F (36.6 C) (Oral)   Resp 18   Ht 5\' 6"  (1.676 m)   Wt 145 lb (65.8 kg)   SpO2 100%   BMI 23.40 kg/m   Physical Exam  Vitals and nursing note reviewed.  Constitutional:      General: He is not in acute distress.    Appearance: Normal appearance.  HENT:     Head: Normocephalic and atraumatic.     Mouth/Throat:     Mouth: Mucous membranes are moist.     Pharynx: Oropharynx is clear.  Cardiovascular:     Rate and Rhythm: Normal rate and regular rhythm.     Pulses: Normal pulses.     Heart sounds: Normal heart sounds.  Pulmonary:     Effort: Pulmonary effort is normal.     Breath sounds: Normal breath sounds. No wheezing, rhonchi or rales.  Abdominal:     General: Bowel sounds are normal. There is no distension.     Palpations: Abdomen is soft.  Skin:    General: Skin is warm and dry.  Neurological:     Mental Status: He is alert and oriented to person, place, and time.  Psychiatric:        Mood and Affect: Mood normal.        Behavior: Behavior normal.    MD  Evaluation Airway: WNL Heart: WNL Abdomen: WNL Chest/ Lungs: WNL ASA  Classification: 2 Mallampati/Airway Score: One  Imaging: CT VIRTUAL COLONOSCOPY SCREENING  Result Date: 12/10/2020 CLINICAL DATA:  Incomplete optical colonoscopy in May, redundant colon, history of right hemicolectomy EXAM: CT VIRTUAL COLONOSCOPY SCREENING TECHNIQUE: The patient was given a standard Mag citrate bowel preparation with Gastrografin and barium for fluid and stool tagging respectively. The quality of the bowel preparation is moderate. Automated CO2 insufflation of the colon was  performed prior to image acquisition and colonic distention is moderate. Image post processing was used to generate a 3D endoluminal fly-through projection of the colon and to electronically subtract stool/fluid as appropriate. COMPARISON:  CT abdomen/pelvis dated 06/04/2013 FINDINGS: VIRTUAL COLONOSCOPY Status post right hemicolectomy. Moderate layering fluid/contrast in the colon, which shifts positions with supine/prone imaging, and does not constraining evaluation. Mild underdistention of the sigmoid colon. However, there is no convincing colonic mass in this location. Within the above constraints, there is no significant colonic mass, polyp, apple core lesion, or stricture. No evidence of bowel obstruction. Appendix is surgically absent. Virtual colonoscopy is not designed to detect diminutive polyps (i.e., less than or equal to 5 mm), the presence or absence of which may not affect clinical management. CT ABDOMEN AND PELVIS WITHOUT CONTRAST Lower chest: Lung bases are clear. Hepatobiliary: Mildly nodular hepatic contour. Perihepatic low-density overlying the right hepatic dome (series 3/image 45) and inferiorly overlying the right hepatic lobe (series 3/image 88), favoring peritoneal disease when correlating with additional findings. Gallbladder is underdistended but grossly unremarkable. No intrahepatic or extrahepatic ductal dilatation.  Pancreas: Within normal limits. Spleen: Within normal limits. Adrenals/Urinary Tract: Adrenal glands are within normal limits. Kidneys are within normal limits. No renal calculi or hydronephrosis. Bladder is underdistended and poorly evaluated. Stomach/Bowel: 2.4 cm lesion at the GE junction (series 3/image 47), possibly reflecting a mucosal lesion, although a peritoneal lesion is also possible at this location. Stomach is grossly unremarkable. No evidence of bowel obstruction. Visualized bowel is otherwise described above. Vascular/Lymphatic: No evidence of abdominal aortic aneurysm. Atherosclerotic calcifications of the abdominal aorta and branch vessels. No suspicious abdominopelvic lymphadenopathy. Reproductive: Uterus is poorly visualized. Bilateral ovaries are grossly unremarkable. Other: No abdominopelvic ascites. Peritoneal nodularity/omental caking beneath the anterior abdominal wall (series 3/image 209). Musculoskeletal: Mild degenerative changes of the lumbar spine. IMPRESSION: No significant colonic polyp, mass, apple core lesion, or stricture. Perihepatic and abdominopelvic peritoneal disease with omental caking beneath the left mid abdominal wall. 2.4 cm soft tissue lesion at the GE junction. This may reflect a mucosal lesion and warrants endoscopic evaluation. If not visible on endoscopy, this likely reflects an additional peritoneal implant. These results will be called to the ordering clinician or representative by the Radiologist Assistant, and communication documented in the PACS or [employer]. Electronically Signed   By: Julian Hy M.D.   On: 12/10/2020 16:30    Labs:  CBC: No results for input(s): WBC, HGB, HCT, PLT in the last 8760 hours.  COAGS: No results for input(s): INR, APTT in the last 8760 hours.  BMP: No results for input(s): NA, K, CL, CO2, GLUCOSE, BUN, CALCIUM, CREATININE, GFRNONAA, GFRAA in the last 8760 hours.  Invalid input(s): CMP  LIVER FUNCTION  TESTS: No results for input(s): BILITOT, AST, ALT, ALKPHOS, PROT, ALBUMIN in the last 8760 hours.  TUMOR MARKERS: No results for input(s): AFPTM, CEA, CA199, CHROMGRNA in the last 8760 hours.  Assessment and Plan: 75 y.o. female with history of colon cancer s/p right colectomy in 2015, recent virtual colonoscopy on 12/10/2020 showed a 2.4 cm lesion at GE junction, underwent EGD on 12/21/2020 which showed normal esophagus and duodenum.  IR was requested for image guided biopsy of peritoneal disease. Case was reviewed and approved for CT-guided core biopsy of peritoneal disease and anterior lower abdomen by Dr. Anselm Pancoast. Patient presents to [facility] IR today for the procedure. N.p.o. since midnight VSS Labs pending Not on anticoagulation antiplatelet treatment  Risks and benefits of peritoneal disease biopsy  was discussed with the patient and/or patient's family including, but not limited to bleeding, infection, damage to adjacent structures or low yield requiring additional tests.  All of the questions were answered and there is agreement to proceed.  Consent signed and in chart.   Thank you for this interesting consult.  I greatly enjoyed meeting Leslie Rivera and look forward to participating in their care.  A copy of this report was sent to the requesting provider on this date.  Electronically Signed: Tera Mater, PA-C 01/05/2021, 10:59 AM   I spent a total of  30 Minutes   in face to face in clinical consultation, greater than 50% of which was counseling/coordinating care for peritoneal disease biopsy

## 2021-01-10 DIAGNOSIS — C189 Malignant neoplasm of colon, unspecified: Secondary | ICD-10-CM | POA: Diagnosis not present

## 2021-01-10 DIAGNOSIS — E039 Hypothyroidism, unspecified: Secondary | ICD-10-CM | POA: Diagnosis not present

## 2021-01-10 DIAGNOSIS — Z0000 Encounter for general adult medical examination without abnormal findings: Secondary | ICD-10-CM | POA: Diagnosis not present

## 2021-01-10 DIAGNOSIS — E7800 Pure hypercholesterolemia, unspecified: Secondary | ICD-10-CM | POA: Diagnosis not present

## 2021-01-12 ENCOUNTER — Telehealth: Payer: Self-pay | Admitting: Nurse Practitioner

## 2021-01-12 NOTE — Telephone Encounter (Signed)
I received a call from [facility] on 7/19 to schedule Leslie Rivera for recurrent cancer. When I cld her today to re-establish care w/Dr. Benay Spice, Leslie Rivera stated she wanted to see Dr. Burr Medico. She has been scheduled to see Lacie on 7/27 at 11am. Pt aware to arrive 15 minutes early.

## 2021-01-18 NOTE — Progress Notes (Addendum)
[place]   [phone] Fax:(336) [phone]   Clinic New Consult Note   Patient Care Team: Merrilee Seashore, MD as PCP - General (Internal Medicine) Merrilee Seashore, MD as Consulting Physician (Internal Medicine) Juanita Craver, MD as Consulting Physician (Gastroenterology) Date of Service: 01/19/2021  CHIEF COMPLAINTS/PURPOSE OF CONSULTATION:  Recurrent colon cancer, referred by GI Dr. Collene Mares  HISTORY OF PRESENTING ILLNESS:  Leslie Rivera 75 y.o. female with history of colon polyps is here because of recurrent cancer.  She was found to have iron deficiency anemia at the end of 2014, work up showed a screening detected infiltrative nonobstructing large mass in the proximal ascending colon which was circumferential on 06/03/2013 colonoscopy.  She underwent right colectomy on 07/17/2013 by Dr. Jackolyn Confer, path showed poorly differentiated invasive adenocarcinoma spanning 5 cm extending into subserosal connective tissue, margins negative, 0/20 positive lymph nodes.  There was no lymphovascular invasion, perineural invasion, or perforation. This was staged pT3pN11mMX, stage II. There was a lost of MLH1 and PMS2 expression. The tumor was BRAF negative.  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 She was followed by PCP in the interim. She had an incomplete optical colonoscopy in May and was referred for CT virtual colonoscopy screening done 12/10/2020 which showed no significant colonic polyp, mass, apple core lesion, or stricture but did find evidence of perihepatic and abdominal pelvic peritoneal disease with omental caking beneath the left mid abdominal wall and a 2.4 cm soft tissue lesion at the GE junction.  Further evaluation EGD on 12/21/2020 by Dr. Benson Norway showed no evidence of mucosal lesion or extrinsic compression at the GE junction.  The procedure was converted  to EUS which showed normal GE junction, celiac access, portal vein, left adrenal, left lobe of the liver, pancreatic duct, gallbladder, and CBD.  In the tail of the pancreas there was a possible mass lesion, difficult to locate at times, which may reflect the lesion noted on virtual CT.  She underwent CT biopsy of the omentum at the lower abdomen/upper pelvis on 01/05/2021, path confirmed metastatic poorly differentiated adenocarcinoma.  IHC stains positive for CK7 and negative for CDX2 and CK20, a nonspecific immunoprofile.  Socially, she is divorced, lives alone.  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  She is independent with ADLs and very active, no significant PMH.  She had normal age-appropriate cervical cancer screenings.  Last mammogram 10/2020 was negative.  Family history is significant for a brother with melanoma, died at age 13.  She drinks alcohol 4-5 times a week, scotch.  She quit smoking in 1998, smoked less than 1 pack/day for 15-20 years.  Denies other drug use.  Today, she presents with her sister.  She continues working.  1 week ago she recently developed low pelvic pain and mild nausea.  Activity and being on her feet exacerbates pain.  She needs to continue working, has big contracts coming up.  Bowels moving normally.  Energy and appetite are normal, denies unintentional weight loss.  Denies bleeding, fever, chills, cough, chest pain, dyspnea.   MEDICAL HISTORY:  Past Medical History:  Diagnosis Date   Abdominal pain    Adenomatous colon polyp    Anemia    Iron deficiency anemia   Anxiety    Cancer ([place])    colon   Colon cancer ([place]) 07/17/2013   History of migraine  headaches    Hypothyroidism    Internal hemorrhoids    Irregular heart beat    occ. PVC's   Periumbilical pain     SURGICAL HISTORY: Past Surgical History:  Procedure Laterality Date   BREAST BIOPSY Right    BREAST SURGERY     removal abnormal cells in  right   ESOPHAGOGASTRODUODENOSCOPY (EGD) WITH PROPOFOL N/A 12/21/2020   Procedure: ESOPHAGOGASTRODUODENOSCOPY (EGD) WITH PROPOFOL;  Surgeon: Carol Ada, MD;  Location: [facility];  Service: Endoscopy;  Laterality: N/A;   EUS N/A 12/21/2020   Procedure: UPPER ENDOSCOPIC ULTRASOUND (EUS) LINEAR;  Surgeon: Carol Ada, MD;  Location: [facility];  Service: Endoscopy;  Laterality: N/A;   LAPAROSCOPIC RIGHT COLECTOMY Right 07/17/2013   Procedure: LAPAROSCOPIC ASSISTED  RIGHT COLECTOMY;  Surgeon: Odis Hollingshead, MD;  Location: [facility] ORS;  Service: General;  Laterality: Right;   TONSILLECTOMY     teenager- age 10    SOCIAL HISTORY: Social History   Socioeconomic History   Marital status: Divorced    Spouse name: Not on file   Number of children: Not on file   Years of education: Not on file   Highest education level: Not on file  Occupational History   Occupation: owner of Nurse, [employer]  Tobacco Use   Smoking status: Former    Types: Cigarettes    Quit date: 06/11/1997    Years since quitting: 23.6   Smokeless tobacco: Never  Substance and Sexual Activity   Alcohol use: Yes    Alcohol/week: 0.0 standard drinks    Comment: 2 ounces daily - Scotch   Drug use: No   Sexual activity: Not Currently  Other Topics Concern   Not on file  Social History Narrative   Not on file   Social Determinants of Health   Financial Resource Strain: Not on file  Food Insecurity: Not on file  Transportation Needs: Not on file  Physical Activity: Not on file  Stress: Not on file  Social Connections: Not on file  Intimate Partner Violence: Not on file    FAMILY HISTORY: Family History  Problem Relation Age of Onset   Cancer Brother        internal melanoma   Cancer Maternal Grandmother        colon    ALLERGIES:  is allergic to barium-containing compounds, hydrocodone, and erythromycin.  MEDICATIONS:  Current Outpatient Medications  Medication Sig Dispense Refill    Artificial Tear Ointment (LUBRICANT EYE OP) Place 1-3 drops into both eyes 2 (two) times daily. Systain     calcium carbonate (OS-CAL) 600 MG TABS tablet Take 600 mg by mouth 2 (two) times daily with a meal.     cetirizine (ZYRTEC) 10 MG tablet Take 10 mg by mouth every 30 (thirty) days. As needed     Cholecalciferol (VITAMIN D) 2000 units CAPS Take 2,000 Units by mouth daily.     diazepam (VALIUM) 5 MG tablet Take 5 mg by mouth every 8 (eight) hours as needed for anxiety.      escitalopram (LEXAPRO) 10 MG tablet Take 10 mg by mouth daily.     levothyroxine (SYNTHROID, LEVOTHROID) 75 MCG tablet Take 75 mcg by mouth daily.  3   oxyCODONE-acetaminophen (PERCOCET/ROXICET) 5-325 MG tablet Take 1 tablet by mouth every 6 (six) hours as needed for severe pain. 20 tablet 0   Probiotic Product (PROBIOTIC DAILY PO) Take 1 capsule by mouth daily. Ultra Flora IB     valACYclovir (VALTREX) 1000 MG tablet Take  2,000 mg by mouth daily as needed (Fever Blister).     zolpidem (AMBIEN) 10 MG tablet Take 5-10 mg by mouth at bedtime as needed for sleep.     No current facility-administered medications for this visit.    REVIEW OF SYSTEMS:   Constitutional: Denies fatigue, unintentional weight loss, fevers, chills or abnormal night sweats Eyes: Denies blurriness of vision, double vision or watery eyes Ears, nose, mouth, throat, and face: Denies mucositis or sore throat Respiratory: Denies cough, dyspnea or wheezes Cardiovascular: Denies palpitation, chest discomfort or lower extremity swelling Gastrointestinal:  Denies vomiting, constipation, diarrhea, hematochezia, melena, heartburn or change in bowel habits (+) lower abdomen/pelvic pain (+) nausea Skin: Denies abnormal skin rashes Lymphatics: Denies new lymphadenopathy or easy bruising Neurological:Denies numbness, tingling or new weaknesses Behavioral/Psych: Mood is stable, no new changes  All other systems were reviewed with the patient and are  negative.  PHYSICAL EXAMINATION: ECOG PERFORMANCE STATUS: 1 - Symptomatic but completely ambulatory  Vitals:   01/19/21 1117  BP: 129/82  Pulse: 64  Resp: 19  Temp: 97.8 F (36.6 C)  SpO2: 99%   Filed Weights   01/19/21 1117  Weight: 148 lb (67.1 kg)    GENERAL:alert, no distress and comfortable SKIN: No rash EYES: sclera clear NECK: Without mass LYMPH:  no palpable cervical or supraclavicular lymphadenopathy  LUNGS: clear with normal breathing effort HEART: regular rate & rhythm, no lower extremity edema ABDOMEN:abdomen soft, non-tender and normal bowel sounds PSYCH: alert & oriented x 3 with fluent speech NEURO: no focal motor/sensory deficits  LABORATORY DATA:  I have reviewed the data as listed CBC Latest Ref Rng & Units 01/05/2021 03/19/2016 03/14/2016  WBC 4.0 - 10.5 K/uL 5.3 4.6 6.6  Hemoglobin 12.0 - 15.0 g/dL 12.2 13.2 15.5  Hematocrit 36.0 - 46.0 % 38.8 40.1 44.6  Platelets 150 - 400 K/uL 363 248 -   CMP Latest Ref Rng & Units 01/05/2021 03/19/2016 03/14/2016  Glucose 70 - 99 mg/dL 89 103(H) 85  BUN 8 - 23 mg/dL $Remove'19 19 25  'rLhMZpG$ Creatinine 0.44 - 1.00 mg/dL 0.61 0.62 0.68  Sodium 135 - 145 mmol/L 137 144 141  Potassium 3.5 - 5.1 mmol/L 4.2 4.3 4.9  Chloride 98 - 111 mmol/L 108 115(H) 108  CO2 22 - 32 mmol/L 23 19(L) 24  Calcium 8.9 - 10.3 mg/dL 9.5 9.3 10.1  Total Protein 6.1 - 8.1 g/dL - - 7.6  Total Bilirubin 0.2 - 1.2 mg/dL - - 0.9  Alkaline Phos 33 - 130 U/L - - 70  AST 10 - 35 U/L - - 15  ALT 6 - 29 U/L - - 12     RADIOGRAPHIC STUDIES: I have personally reviewed the radiological images as listed and agreed with the findings in the report. CT BIOPSY  Result Date: 01/05/2021 INDICATION: History of colon cancer, now with omental thickening worrisome for metastatic disease. Please perform omental biopsy for tissue diagnostic purposes. EXAM: CT-GUIDED BIOPSY OF OMENTAL THICKENING INVOLVING THE VENTRAL ASPECT THE LOWER ABDOMEN/UPPER PELVIS. COMPARISON:  CT  abdomen and pelvis-12/10/2020 MEDICATIONS: None. ANESTHESIA/SEDATION: Fentanyl 100 mcg IV; Versed 3 mg IV Sedation time: 14 minutes; The patient was continuously monitored during the procedure by the interventional radiology nurse under my direct supervision. CONTRAST:  None. COMPLICATIONS: None immediate. PROCEDURE: Informed consent was obtained from the patient following an explanation of the procedure, risks, benefits and alternatives. A time out was performed prior to the initiation of the procedure. The patient was positioned supine on the CT  table and a limited CT was performed for procedural planning demonstrating unchanged appearance of omental thickening with dominant component measuring 13.0 x 12.5 cm (image 63, series 2). The procedure was planned. The operative site was prepped and draped in the usual sterile fashion. Appropriate trajectory was confirmed with a 22 gauge spinal needle after the adjacent tissues were anesthetized with 1% Lidocaine with epinephrine. Under intermittent CT guidance, a 17 gauge coaxial needle was advanced into the internal right lateral aspect of the omental thickening. Appropriate positioning was confirmed and 6 core needle biopsy samples were obtained with an 18 gauge core needle biopsy device. The co-axial needle was removed following administration of a Gel-Foam slurry and superficial hemostasis was achieved with manual compression. A dressing was applied. The patient tolerated the procedure well without immediate postprocedural complication. IMPRESSION: Technically successful CT guided core needle biopsy of omental thickening within ventral aspect the lower abdomen/upper pelvis. Electronically Signed   By: Sandi Mariscal M.D.   On: 01/05/2021 14:20    ASSESSMENT & PLAN: 75 year old female with no significant PMH  1.  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 She had incomplete optic colonoscopy in May, virtual  colonoscopy 12/10/20 showed peritoneal adenopathy concerning for metastatic disease, with no apple core lesion or other colonic abnormality, no obvious primary site -she aged out of cervical cancer screenings, last mammogram 11/17/20 is negative -omental biopsy 01/05/21 confirmed metastatic poorly differentiated adenocarcinoma.  -IHC stains are positive for CK7 and negative for CDX2 and CK20, the immunoprofile is nonspecific, differential can include upper GI, breast, and lung primary among other -Last mammogram 11/17/2020 was negative.  No recent GYN eval -We are referring her for PET scan and tumor markers to narrow the differential.  We will request foundation 1 on the omental biopsy -We discussed while primary peritoneal carcinoma is a possibility, this is likely metastatic disease from another primary site -We discussed the nature of peritoneal metastasis, which is not operable but still treatable. If she has MSI-high disease, she will be a good candidate for immunotherapy such as pembrolizumab.  -she is focused on quality of life not quantity, but will consider treatment if she can tolerate it. She is familiar with hospice and had a good experience with her mother when that time comes. She has very realistic and positive outlook. -lab today, PET scan this week, FO requested.  -F/up 1-2 days after PET scan to discuss   2.  History of poorly differentiated adenocarcinoma of the ascending colon, pT3N0M0, stage II, loss of MLH1 and PMS2 expression, BRAF negative -She developed iron deficiency anemia in 2014, screening detected ascending colon mass, biopsy showed poorly differentiated adenocarcinoma -Staging CT was negative. She underwent right colectomy by Dr. Jackolyn Confer on 07/17/13, path showed poorly differentiated invasive adenocarcinoma spanning 5 cm extending into subserosal connective tissue, margins negative, 0/20 positive lymph nodes.  There was no lymphovascular invasion, perineural  invasion, or perforation. This was staged pT3pN30mMX, stage II.  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  She did not undergo subsequent genetic testing but agrees to discuss with Dietitian. Referral placed today. -Surveillance plan per PCP and GI Dr. Collene Mares  PLAN: -Work up reviewed -Referral to genetics  -MMR on omental biopsy and Foundation One testing requested -Lab today, PET scan in the next week -F/up few days after PET scan    Orders Placed  This Encounter  Procedures   NM PET Image Initial (PI) Skull Base To Thigh    Standing Status:   Future    Standing Expiration Date:   01/19/2022    Order Specific Question:   If indicated for the ordered procedure, I authorize the administration of a radiopharmaceutical per Radiology protocol    Answer:   Yes    Order Specific Question:   Preferred imaging location?    Answer:   [address]   [address]    Standing Status:   Future    Number of Occurrences:   1    Standing Expiration Date:   01/19/2022   CA 19.9    Standing Status:   Future    Number of Occurrences:   1    Standing Expiration Date:   01/19/2022   [facility] (IN HOUSE-[facility])FOR [facility] ONLY    Standing Status:   Future    Number of Occurrences:   1    Standing Expiration Date:   01/19/2022   Ambulatory referral to Genetics    Referral Priority:   Routine    Referral Type:   Consultation    Referral Reason:   Specialty Services Required    Number of Visits Requested:   1    All questions were answered. The patient knows to call the clinic with any problems, questions or concerns.    Alla Feeling, NP 01/20/2021   Addendum 75 year old female with past medical history of stage II colon cancer in 2015, was unfortunately found to have peritoneal metastasis on routine screening colonography.  She underwent omentum biopsy, which showed poorly differentiated  adenocarcinoma, immunostains showed positive CK7 and negative for CDX2 and CK20.  This supporting upper GI, or lung or breast cancer, less likely colon primary.  She had a negative mammogram in May 2022.  I recommend PET scan to complete staging and evaluation for primary tumor.  We will get a tumor marker today.  I will also send her biopsy for foundation 1 genomic sequencing, to see if she is a candidate for immunotherapy or targeted therapy.  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  She values her quality of life much more than quantity of her life, she lives alone, no children, very independent and functional.  She is open to walk up and treatment if that improves her quality of life.  I plan to see her back in 2 weeks after her PET scan. Will request MMR on her biopsy also.  All questions were answered.  Truitt Merle MD 01/19/2021

## 2021-01-19 ENCOUNTER — Encounter: Payer: Self-pay | Admitting: Nurse Practitioner

## 2021-01-19 ENCOUNTER — Inpatient Hospital Stay: Payer: Medicare Other | Attending: Nurse Practitioner | Admitting: Nurse Practitioner

## 2021-01-19 ENCOUNTER — Other Ambulatory Visit: Payer: Self-pay

## 2021-01-19 ENCOUNTER — Inpatient Hospital Stay: Payer: Medicare Other

## 2021-01-19 VITALS — BP 129/82 | HR 64 | Temp 97.8°F | Resp 19 | Ht 66.0 in | Wt 148.0 lb

## 2021-01-19 DIAGNOSIS — D509 Iron deficiency anemia, unspecified: Secondary | ICD-10-CM | POA: Insufficient documentation

## 2021-01-19 DIAGNOSIS — F419 Anxiety disorder, unspecified: Secondary | ICD-10-CM | POA: Diagnosis not present

## 2021-01-19 DIAGNOSIS — Z87891 Personal history of nicotine dependence: Secondary | ICD-10-CM | POA: Insufficient documentation

## 2021-01-19 DIAGNOSIS — C786 Secondary malignant neoplasm of retroperitoneum and peritoneum: Secondary | ICD-10-CM

## 2021-01-19 DIAGNOSIS — R971 Elevated cancer antigen 125 [CA 125]: Secondary | ICD-10-CM | POA: Diagnosis not present

## 2021-01-19 DIAGNOSIS — C189 Malignant neoplasm of colon, unspecified: Secondary | ICD-10-CM | POA: Diagnosis not present

## 2021-01-19 DIAGNOSIS — R978 Other abnormal tumor markers: Secondary | ICD-10-CM | POA: Diagnosis not present

## 2021-01-19 DIAGNOSIS — Z86010 Personal history of colonic polyps: Secondary | ICD-10-CM | POA: Diagnosis not present

## 2021-01-19 DIAGNOSIS — E039 Hypothyroidism, unspecified: Secondary | ICD-10-CM | POA: Insufficient documentation

## 2021-01-19 LAB — CEA (IN HOUSE-CHCC): CEA (CHCC-In House): 1.33 ng/mL (ref 0.00–5.00)

## 2021-01-19 MED ORDER — OXYCODONE-ACETAMINOPHEN 5-325 MG PO TABS: 1.0000 | ORAL_TABLET | Freq: Four times a day (QID) | ORAL | 0 refills | Status: DC | PRN

## 2021-01-19 NOTE — Progress Notes (Signed)
I met Ms Hebert and her sister today after her consult appt with Cira Rue, NP and Dr Burr Medico.  I introduced myself, explained my role, and provided my direct contact information.  I reviewed the current plan with them as outlined by Cira Rue, NP.  She states that her appetite is good and that her weight is stable.  She insured and at this time does not have any financial needs.  Ms Funk and her sister verbalized understanding.  All questions were answered.

## 2021-01-19 NOTE — Progress Notes (Signed)
I sent an email to [facility] pathology requesting foundation one testing on patients tissue sample from biopsy on 01/05/2021

## 2021-01-20 ENCOUNTER — Encounter: Payer: Self-pay | Admitting: Nurse Practitioner

## 2021-01-20 ENCOUNTER — Telehealth: Payer: Self-pay | Admitting: Hematology

## 2021-01-20 LAB — CANCER ANTIGEN 19-9: CA 19-9: 6 U/mL (ref 0–35)

## 2021-01-20 LAB — CA 125: Cancer Antigen (CA) 125: 6790 U/mL — ABNORMAL HIGH (ref 0.0–38.1)

## 2021-01-20 NOTE — Telephone Encounter (Signed)
Scheduled follow-up appointment per 7/27 los. Patient is aware. 

## 2021-01-21 ENCOUNTER — Telehealth: Payer: Self-pay | Admitting: Hematology

## 2021-01-21 NOTE — Telephone Encounter (Signed)
Scheduled appt per 7/28 sch msg. Pt aware.  

## 2021-01-21 NOTE — Progress Notes (Signed)
I called Ms Riding per Cira Rue NP's request to discuss CA 125 results. I reviewed that elevated CA 125 results can be indicative of ovarian cancer.  I told the PET scan will aid in identifying the origin of her cancer.  She verbalized understanding.  She was grateful for the phone call.

## 2021-01-24 ENCOUNTER — Encounter: Payer: Self-pay | Admitting: Licensed Clinical Social Worker

## 2021-01-24 ENCOUNTER — Other Ambulatory Visit: Payer: Self-pay | Admitting: Licensed Clinical Social Worker

## 2021-01-24 ENCOUNTER — Inpatient Hospital Stay: Payer: Medicare Other

## 2021-01-24 ENCOUNTER — Inpatient Hospital Stay: Payer: Medicare Other | Attending: Genetic Counselor | Admitting: Licensed Clinical Social Worker

## 2021-01-24 ENCOUNTER — Other Ambulatory Visit: Payer: Self-pay

## 2021-01-24 DIAGNOSIS — E039 Hypothyroidism, unspecified: Secondary | ICD-10-CM | POA: Insufficient documentation

## 2021-01-24 DIAGNOSIS — I700 Atherosclerosis of aorta: Secondary | ICD-10-CM | POA: Insufficient documentation

## 2021-01-24 DIAGNOSIS — D500 Iron deficiency anemia secondary to blood loss (chronic): Secondary | ICD-10-CM | POA: Insufficient documentation

## 2021-01-24 DIAGNOSIS — Z79899 Other long term (current) drug therapy: Secondary | ICD-10-CM | POA: Insufficient documentation

## 2021-01-24 DIAGNOSIS — Z803 Family history of malignant neoplasm of breast: Secondary | ICD-10-CM

## 2021-01-24 DIAGNOSIS — Z808 Family history of malignant neoplasm of other organs or systems: Secondary | ICD-10-CM

## 2021-01-24 DIAGNOSIS — C786 Secondary malignant neoplasm of retroperitoneum and peritoneum: Secondary | ICD-10-CM

## 2021-01-24 DIAGNOSIS — C182 Malignant neoplasm of ascending colon: Secondary | ICD-10-CM | POA: Diagnosis not present

## 2021-01-24 DIAGNOSIS — Z86010 Personal history of colonic polyps: Secondary | ICD-10-CM | POA: Insufficient documentation

## 2021-01-24 DIAGNOSIS — Z800 Family history of malignant neoplasm of digestive organs: Secondary | ICD-10-CM | POA: Insufficient documentation

## 2021-01-24 DIAGNOSIS — C482 Malignant neoplasm of peritoneum, unspecified: Secondary | ICD-10-CM | POA: Insufficient documentation

## 2021-01-24 DIAGNOSIS — Z85038 Personal history of other malignant neoplasm of large intestine: Secondary | ICD-10-CM | POA: Insufficient documentation

## 2021-01-24 DIAGNOSIS — R180 Malignant ascites: Secondary | ICD-10-CM | POA: Insufficient documentation

## 2021-01-24 DIAGNOSIS — Z853 Personal history of malignant neoplasm of breast: Secondary | ICD-10-CM | POA: Insufficient documentation

## 2021-01-24 DIAGNOSIS — J910 Malignant pleural effusion: Secondary | ICD-10-CM | POA: Insufficient documentation

## 2021-01-24 LAB — GENETIC SCREENING ORDER

## 2021-01-24 NOTE — Progress Notes (Signed)
REFERRING PROVIDER: Alla Feeling, NP [place],  [place] [address]  PRIMARY PROVIDER:  Merrilee Seashore, MD  PRIMARY REASON FOR VISIT:  1. Cancer of ascending colon s/p partial colectomy 07/17/13   2. 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 Family history of colon cancer   4. Family history of breast cancer   5. Family history of melanoma      HISTORY OF PRESENT ILLNESS:   Ms. Leslie Rivera, a 75 y.o. female, was seen for a [facility] cancer genetics consultation at the request of  Cira Rue, NP ue to a personal and family history of cancer.  Leslie Rivera presents to clinic today to discuss the possibility of a hereditary predisposition to cancer, genetic testing, and to further clarify her future cancer risks, as well as potential cancer risks for family members.   In 2015, at the age of 72, Leslie Rivera was diagnosed with colon cancer. This was treated with resection. 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  In 2022, at the age of 27, Leslie Rivera was diagnosed with metastatic cancer, primary not yet known.   CANCER HISTORY:  Oncology History   No history exists.     RISK FACTORS:  Menarche was at age 29.  OCP use: yes Ovaries intact: yes.  Hysterectomy: no.  Menopausal status: postmenopausal.  HRT use: 3 years. Mammogram within the last year: yes. Number of breast biopsies: 1. Up to date with pelvic exams: yes.   Past Medical History:  Diagnosis Date   Abdominal pain    Adenomatous colon polyp    Anemia    Iron deficiency anemia   Anxiety    Cancer ([place])    colon   Colon cancer ([place]) 07/17/2013   Family history of breast cancer    Family history of colon cancer    Family history of melanoma    History of migraine headaches    Hypothyroidism    Internal hemorrhoids    Irregular heart beat    occ. PVC's   Periumbilical pain     Past Surgical History:  Procedure Laterality Date   BREAST BIOPSY  Right    BREAST SURGERY     removal abnormal cells in right   ESOPHAGOGASTRODUODENOSCOPY (EGD) WITH PROPOFOL N/A 12/21/2020   Procedure: ESOPHAGOGASTRODUODENOSCOPY (EGD) WITH PROPOFOL;  Surgeon: Carol Ada, MD;  Location: [facility];  Service: Endoscopy;  Laterality: N/A;   EUS N/A 12/21/2020   Procedure: UPPER ENDOSCOPIC ULTRASOUND (EUS) LINEAR;  Surgeon: Carol Ada, MD;  Location: [facility];  Service: Endoscopy;  Laterality: N/A;   LAPAROSCOPIC RIGHT COLECTOMY Right 07/17/2013   Procedure: LAPAROSCOPIC ASSISTED  RIGHT COLECTOMY;  Surgeon: Odis Hollingshead, MD;  Location: [facility];  Service: General;  Laterality: Right;   TONSILLECTOMY     teenager- age 79    Social History   Socioeconomic History   Marital status: Divorced    Spouse name: Not on file   Number of children: Not on file   Years of education: Not on file   Highest education level: Not on file  Occupational History   Occupation: owner of [employer]  Tobacco Use   Smoking status: Former    Types: Cigarettes    Quit date: 06/11/1997    Years since quitting: 23.6   Smokeless tobacco: Never  Substance and Sexual Activity   Alcohol use: Yes    Alcohol/week: 0.0 standard drinks    Comment: 2 ounces daily - Scotch  Drug use: No   Sexual activity: Not Currently  Other Topics Concern   Not on file  Social History Narrative   Not on file   Social Determinants of Health   Financial Resource Strain: Not on file  Food Insecurity: Not on file  Transportation Needs: Not on file  Physical Activity: Not on file  Stress: Not on file  Social Connections: Not on file     FAMILY HISTORY:  We obtained a detailed, 4-generation family history.  Significant diagnoses are listed below: Family History  Problem Relation Age of Onset   Cancer Brother        internal melanoma   Cancer Maternal Grandmother        colon   Leslie Rivera does not have children. She had 1 brother, 1 sister. Her brother died at  77 from metastatic melanoma. She reports this was a rare melanoma where they could never find it on his skin. He had 1 daughter, she is now in her 45s. Patient's sister has had some non-melanoma skin cancers removed and is living at 32.  Leslie Rivera mother died at 29, no cancers. Patient had 2 maternal aunts, 2 maternal uncles. One aunt had a blood cancer, possibly lymphoma. Her two daughters had breast cancer in their 67s, however there was a lot of cancer on their father's side of the family. Maternal grandmother had colon cancer in her 80s-60s and died at 61. Grandfather died in his 12s.   Leslie Rivera father died at 13. She has limited information about his side. Patient had 7 paternal aunts/uncles. One aunt had lung cancer and history of smoking. Another aunt had cancer, unknown type. No known cousins with cancer. Paternal grandmother had jaw cancer and history of tobacco use. Paternal grandfather died in his 3s.  Leslie Rivera is unaware of previous family history of genetic testing for hereditary cancer risks. Patient's maternal ancestors are of Scottish/Irish/Nordic descent, and paternal ancestors are of unknown descent. There is no reported Ashkenazi Jewish ancestry. There is no known consanguinity.    GENETIC COUNSELING ASSESSMENT: [name] is a 75 y.o. female with a personal and family history of cancer which is somewhat suggestive of a hereditary cancer syndrome and predisposition to cancer. We, therefore, discussed and recommended the following at today's visit.   DISCUSSION: We discussed that approximately 5-10% of colon cancer is hereditary. Most cases of hereditary colon cancer are associated with Lynch genes. We discussed these genes in detail as well as the testing done on her tumor in 2015, which possibly suggests she has Lynch syndrome. There are other genes associated with hereditary cancer as well. Cancers and risks are gene specific.  We discussed that testing is beneficial  for several reasons including nowing about other cancer risks, identifying potential screening and risk-reduction options that may be appropriate, and to understand if other family members could be at risk for cancer and allow them to undergo genetic testing.   We reviewed the characteristics, features and inheritance patterns of hereditary cancer syndromes. We also discussed genetic testing, including the appropriate family members to test, the process of testing, insurance coverage and turn-around-time for results. We discussed the implications of a negative, positive and/or variant of uncertain significant result. We recommended Ms. Cieslak pursue genetic testing for the [facility] CancerNext-Expanded+RNA gene panel.   Based on Ms. Arechiga's personal and family history of cancer, she meets medical criteria for genetic testing. Despite that she meets criteria, she may still have an out of pocket  cost. We discussed that if her out of pocket cost for testing is over $100, the laboratory will call and confirm whether she wants to proceed with testing.  If the out of pocket cost of testing is less than $100 she will be billed by the genetic testing laboratory.   PLAN: After considering the risks, benefits, and limitations, Ms. Renegar provided informed consent to pursue genetic testing and the blood sample was sent to [facility] for analysis of the CancerNext-Expanded+RNA panel. Results should be available within approximately 2-3 weeks' time, at which point they will be disclosed by telephone to Ms. Swords, as will any additional recommendations warranted by these results. Ms. Fong will receive a summary of her genetic counseling visit and a copy of her results once available. This information will also be available in Epic.   Ms. Kovacich questions were answered to her satisfaction today. Our contact information was provided should additional questions or concerns arise. Thank you for the referral  and allowing [place] to share in the care of your patient.   Faith Rogue, MS, [facility] Genetic Counselor [email or web address] Phone: [phone]  The patient was seen for a total of 35 minutes in face-to-face genetic counseling.  Patient was seen with her sister. Drs. Magrinat/Gudena/ and/or Burr Medico were available for discussion regarding this case.   _______________________________________________________________________ For Office Staff:  Number of people involved in session: 2 Was an Intern/ student involved with case: no

## 2021-01-26 ENCOUNTER — Other Ambulatory Visit: Payer: Self-pay | Admitting: *Deleted

## 2021-01-26 ENCOUNTER — Telehealth: Payer: Self-pay | Admitting: Hematology

## 2021-01-26 NOTE — Progress Notes (Signed)
The proposed treatment discussed in conference is for discussion purpose only and is not a binding recommendation.  The patients have not been physically examined, or presented with their treatment options.  Therefore, final treatment plans cannot be decided.  

## 2021-01-26 NOTE — Progress Notes (Signed)
I spoke with Leslie Rivera and let her know that her PET scan has been rescheduled to 8/5 at 0930 arrive at [facility] at 0900.  I also sent a scheduling message to move her lab and f/u with Dr Burr Medico to 8/9.  Leslie Felan verbalized understanding.

## 2021-01-26 NOTE — Telephone Encounter (Signed)
R/s appts per 8/3 sch msg. Pt aware.

## 2021-01-28 ENCOUNTER — Telehealth: Payer: Self-pay | Admitting: Oncology

## 2021-01-28 ENCOUNTER — Other Ambulatory Visit: Payer: Self-pay

## 2021-01-28 ENCOUNTER — Encounter (HOSPITAL_COMMUNITY)
Admission: RE | Admit: 2021-01-28 | Discharge: 2021-01-28 | Disposition: A | Discharge status: Home / Self Care | Payer: Medicare Other | Source: Ambulatory Visit | Attending: Nurse Practitioner | Admitting: Nurse Practitioner

## 2021-01-28 DIAGNOSIS — R188 Other ascites: Secondary | ICD-10-CM | POA: Insufficient documentation

## 2021-01-28 DIAGNOSIS — J90 Pleural effusion, not elsewhere classified: Secondary | ICD-10-CM | POA: Diagnosis not present

## 2021-01-28 DIAGNOSIS — I700 Atherosclerosis of aorta: Secondary | ICD-10-CM | POA: Insufficient documentation

## 2021-01-28 DIAGNOSIS — C786 Secondary malignant neoplasm of retroperitoneum and peritoneum: Secondary | ICD-10-CM | POA: Diagnosis not present

## 2021-01-28 DIAGNOSIS — C801 Malignant (primary) neoplasm, unspecified: Secondary | ICD-10-CM | POA: Diagnosis not present

## 2021-01-28 LAB — GLUCOSE, CAPILLARY: Glucose-Capillary: 88 mg/dL (ref 70–99)

## 2021-01-28 LAB — SURGICAL PATHOLOGY

## 2021-01-28 MED ORDER — FLUDEOXYGLUCOSE F - 18 (FDG) INJECTION
7.0000 | Freq: Once | INTRAVENOUS | Status: AC | PRN
  Administered 2021-01-28: 7.3 via INTRAVENOUS

## 2021-01-28 NOTE — Telephone Encounter (Signed)
Called Leslie Rivera and scheduled new patient appointment with Dr. Alvy Bimler on 01/31/21 at 1:00 (arrival at 12:30).  She verbalized understanding and agreement.

## 2021-01-30 ENCOUNTER — Telehealth: Payer: Self-pay | Admitting: Hematology

## 2021-01-30 NOTE — Telephone Encounter (Signed)
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 I have arranged her to see my partner Dr. Alvy Bimler who is specialized in treating GYN malignancies, and her appointment is tomorrow.   I reviewed her PET scan myself (not read yet) and briefly discussed the findings with er. She has diffuse peritoneal mets, and two hypermetabolic mediastinal nodes.   Pt appreciated my call, she is not sure if she wants chemo, but is open to treatment options Dr. Alvy Bimler will discuss with her.   Truitt Merle  01/30/2021

## 2021-01-31 ENCOUNTER — Inpatient Hospital Stay: Payer: Medicare Other | Admitting: Hematology and Oncology

## 2021-01-31 ENCOUNTER — Other Ambulatory Visit: Payer: Self-pay

## 2021-01-31 ENCOUNTER — Other Ambulatory Visit: Payer: Self-pay | Admitting: Oncology

## 2021-01-31 ENCOUNTER — Encounter: Payer: Self-pay | Admitting: Hematology and Oncology

## 2021-01-31 DIAGNOSIS — Z7189 Other specified counseling: Secondary | ICD-10-CM

## 2021-01-31 DIAGNOSIS — C482 Malignant neoplasm of peritoneum, unspecified: Secondary | ICD-10-CM | POA: Diagnosis not present

## 2021-01-31 DIAGNOSIS — C5701 Malignant neoplasm of right fallopian tube: Secondary | ICD-10-CM | POA: Insufficient documentation

## 2021-01-31 DIAGNOSIS — D500 Iron deficiency anemia secondary to blood loss (chronic): Secondary | ICD-10-CM | POA: Diagnosis not present

## 2021-01-31 DIAGNOSIS — J90 Pleural effusion, not elsewhere classified: Secondary | ICD-10-CM | POA: Diagnosis not present

## 2021-01-31 DIAGNOSIS — E039 Hypothyroidism, unspecified: Secondary | ICD-10-CM | POA: Diagnosis not present

## 2021-01-31 DIAGNOSIS — Z853 Personal history of malignant neoplasm of breast: Secondary | ICD-10-CM | POA: Diagnosis not present

## 2021-01-31 DIAGNOSIS — I700 Atherosclerosis of aorta: Secondary | ICD-10-CM | POA: Diagnosis not present

## 2021-01-31 DIAGNOSIS — Z85038 Personal history of other malignant neoplasm of large intestine: Secondary | ICD-10-CM | POA: Diagnosis not present

## 2021-01-31 DIAGNOSIS — J910 Malignant pleural effusion: Secondary | ICD-10-CM | POA: Diagnosis not present

## 2021-01-31 DIAGNOSIS — R180 Malignant ascites: Secondary | ICD-10-CM | POA: Insufficient documentation

## 2021-01-31 DIAGNOSIS — Z86010 Personal history of colonic polyps: Secondary | ICD-10-CM | POA: Diagnosis not present

## 2021-01-31 DIAGNOSIS — Z79899 Other long term (current) drug therapy: Secondary | ICD-10-CM | POA: Diagnosis not present

## 2021-01-31 DIAGNOSIS — Z800 Family history of malignant neoplasm of digestive organs: Secondary | ICD-10-CM | POA: Diagnosis not present

## 2021-01-31 NOTE — Assessment & Plan Note (Signed)
She has palpable ascites on exam but she is not symptomatic Observe for now

## 2021-01-31 NOTE — Progress Notes (Signed)
Gynecologic Oncology Multi-Disciplinary Disposition Conference Note  Date of the Conference: 01/31/2021  Patient Name: Leslie Rivera  Referring Provider: Dr. 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 Disposition is to chemotherapy for 3-4 cycles followed by repeat imaging.  If complete response then consideration for surgery.   This Multidisciplinary conference took place involving physicians from [place], [place], Radiation Oncology, Pathology, Radiology along with the Gynecologic Oncology Nurse Practitioner and RN.  Comprehensive assessment of the patient's malignancy, staging, need for surgery, chemotherapy, radiation therapy, and need for further testing were reviewed. Supportive measures, both inpatient and following discharge were also discussed. The recommended plan of care is documented. Greater than 35 minutes were spent correlating and coordinating this patient's care.

## 2021-01-31 NOTE — Assessment & Plan Note (Signed)
Colonoscopy was negative and biopsy come from GYN primary

## 2021-01-31 NOTE — Progress Notes (Signed)
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 female was transferred to my care at the request of her primary oncologist due to recent findings of GYN primary malignancy "Noni" is here with her sister, Raul Del She is single with no children She is still working, on her own business working with picture frames She has a dog She has remote history of colon cancer status post surgery but did not require adjuvant treatment She was noted to have abnormal MMR/MSI testing but did not see genetic counseling and was followed with gastroenterologist and primary care doctor after her primary resection She presented initially with iron deficiency anemia and further work-up revealed colon cancer and she underwent surgical resection in early 2015  Recently, she complain of abdominal bloating sensation and changes in bowel habits She had virtual colonoscopy which did not show abnormal lesions but revealed carcinomatosis and fluid in her abdomen Upper endoscopy evaluation was unremarkable Subsequently, she underwent CT-guided biopsy of omental mass which revealed poorly differentiated adenocarcinoma, further immunohistochemical staining confirm GYN primary Her case was presented at the GYN oncology tumor board today She had recent PET/CT imaging performed  I reviewed the patient's records extensive and collaborated the history with the patient. Summary of her history is as follows: Oncology History Overview  Note  Normal MMR   Primary peritoneal carcinomatosis ([place])  01/05/2021 Pathology Results   SURGICAL PATHOLOGY  CASE: [phone]  PATIENT: [facility]  Surgical Pathology Report   Reason for Addendum #1:  Immunohistochemistry results  Reason for Addendum #2:  DNA Mismatch Repair IHC Results   Clinical History: history of colon cancer, now with omental thickening  (cm)   FINAL MICROSCOPIC DIAGNOSIS:   A. OMENTUM, NEEDLE CORE BIOPSY:  - Metastatic poorly differentiated adenocarcinoma.  See comment   COMMENT:   Immunohistochemical stains show that the tumor cells are positive for CK7 and negative for CDX2 and CK20.  This immunoprofile is nonspecific and differential diagnosis can include an upper gastrointestinal, breast and lung primary among other possibilities.  Only a very small fraction of primary colonic adenocarcinoma was immunoprofile.  Clinical and radiologic correlation suggested.   Additional immunohistochemical stains are performed and show that the tumor cells are positive for WT1, PAX8 and ER.  Immunostain for p53 shows a clonal overexpression pattern.  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  Clinical and radiologic correlation is suggested.    01/19/2021 Tumor Marker   Patient's tumor was tested for the following markers: CA-125. Results of the tumor marker test revealed 6790.   01/28/2021 PET scan   1. Examination is positive for extensive FDG avid peritoneal disease within the abdomen and pelvis including multiple implants upon the surface of liver and extensive omental caking. Moderate ascites noted within the pelvis. 2. FDG avid retroperitoneal, retrocrural, right internal mammary, and mediastinal lymph nodes compatible with metastatic adenopathy. 3. Small right pleural effusion. 4.  Aortic Atherosclerosis (ICD10-I70.0).     01/31/2021 Initial Diagnosis   Primary peritoneal  carcinomatosis ([place])    01/31/2021 Cancer Staging  Staging form: Ovary, Fallopian Tube, and Primary Peritoneal Carcinoma, AJCC 8th Edition - Clinical stage from 01/31/2021: FIGO Stage IV (cT2b, cN1b, cM1) - Signed by Heath Lark, MD on 01/31/2021  Stage prefix: Initial diagnosis     Since her recent biopsy, she feels fine.  She has good appetite She did not think she lost some weight No significant changes in her bowel habits She did complain of mild sensation of bloating Denies cough or shortness of breath on exertion  MEDICAL HISTORY:  Past Medical History:  Diagnosis Date   Abdominal pain    Adenomatous colon polyp    Anemia    Iron deficiency anemia   Anxiety    Cancer ([place])    colon   Colon cancer ([place]) 07/17/2013   Family history of breast cancer    Family history of colon cancer    Family history of melanoma    History of migraine headaches    Hypothyroidism    Internal hemorrhoids    Irregular heart beat    occ. PVC's   Periumbilical pain     SURGICAL HISTORY: Past Surgical History:  Procedure Laterality Date   BREAST BIOPSY Right    BREAST SURGERY     removal abnormal cells in right   ESOPHAGOGASTRODUODENOSCOPY (EGD) WITH PROPOFOL N/A 12/21/2020   Procedure: ESOPHAGOGASTRODUODENOSCOPY (EGD) WITH PROPOFOL;  Surgeon: Carol Ada, MD;  Location: [facility] ENDOSCOPY;  Service: Endoscopy;  Laterality: N/A;   EUS N/A 12/21/2020   Procedure: UPPER ENDOSCOPIC ULTRASOUND (EUS) LINEAR;  Surgeon: Carol Ada, MD;  Location: [facility] ENDOSCOPY;  Service: Endoscopy;  Laterality: N/A;   LAPAROSCOPIC RIGHT COLECTOMY Right 07/17/2013   Procedure: LAPAROSCOPIC ASSISTED  RIGHT COLECTOMY;  Surgeon: Odis Hollingshead, MD;  Location: [facility] ORS;  Service: General;  Laterality: Right;   TONSILLECTOMY     teenager- age 14    SOCIAL HISTORY: Social History   Socioeconomic History   Marital status: Divorced    Spouse name: Not on file   Number of children: Not on file   Years of education:  Not on file   Highest education level: Not on file  Occupational History   Occupation: owner of Nurse, [employer]  Tobacco Use   Smoking status: Former    Types: Cigarettes    Quit date: 06/11/1997    Years since quitting: 23.6   Smokeless tobacco: Never  Substance and Sexual Activity   Alcohol use: Yes    Alcohol/week: 0.0 standard drinks    Comment: 2 ounces daily - Scotch   Drug use: No   Sexual activity: Not Currently  Other Topics Concern   Not on file  Social History Narrative   Not on file   Social Determinants of Health   Financial Resource Strain: Not on file  Food Insecurity: Not on file  Transportation Needs: Not on file  Physical Activity: Not on file  Stress: Not on file  Social Connections: Not on file  Intimate Partner Violence: Not on file    FAMILY HISTORY: Family History  Problem Relation Age of Onset   Cancer Brother        internal melanoma   Cancer Maternal Grandmother        colon    ALLERGIES:  is allergic to barium-containing compounds, hydrocodone, and erythromycin.  MEDICATIONS:  Current Outpatient Medications  Medication Sig Dispense Refill   Artificial Tear Ointment (LUBRICANT EYE OP) Place 1-3 drops into both eyes 2 (two) times daily. Systain     calcium carbonate (OS-CAL)  600 MG TABS tablet Take 600 mg by mouth 2 (two) times daily with a meal.     cetirizine (ZYRTEC) 10 MG tablet Take 10 mg by mouth every 30 (thirty) days. As needed     Cholecalciferol (VITAMIN D) 2000 units CAPS Take 2,000 Units by mouth daily.     diazepam (VALIUM) 5 MG tablet Take 5 mg by mouth every 8 (eight) hours as needed for anxiety.      escitalopram (LEXAPRO) 10 MG tablet Take 10 mg by mouth daily.     levothyroxine (SYNTHROID, LEVOTHROID) 75 MCG tablet Take 75 mcg by mouth daily.  3   oxyCODONE-acetaminophen (PERCOCET/ROXICET) 5-325 MG tablet Take 1 tablet by mouth every 6 (six) hours as needed for severe pain. 20 tablet 0   Probiotic Product  (PROBIOTIC DAILY PO) Take 1 capsule by mouth daily. Ultra Flora IB     valACYclovir (VALTREX) 1000 MG tablet Take 2,000 mg by mouth daily as needed (Fever Blister).     zolpidem (AMBIEN) 10 MG tablet Take 5-10 mg by mouth at bedtime as needed for sleep.     No current facility-administered medications for this visit.    REVIEW OF SYSTEMS:   Constitutional: Denies fevers, chills or abnormal night sweats Eyes: Denies blurriness of vision, double vision or watery eyes Ears, nose, mouth, throat, and face: Denies mucositis or sore throat Respiratory: Denies cough, dyspnea or wheezes Cardiovascular: Denies palpitation, chest discomfort or lower extremity swelling Skin: Denies abnormal skin rashes Lymphatics: Denies new lymphadenopathy or easy bruising Neurological:Denies numbness, tingling or new weaknesses Behavioral/Psych: Mood is stable, no new changes  All other systems were reviewed with the patient and are negative.  PHYSICAL EXAMINATION: ECOG PERFORMANCE STATUS: 1 - Symptomatic but completely ambulatory  Vitals:   01/31/21 1251  BP: 117/88  Pulse: 65  Resp: 18  Temp: 97.6 F (36.4 C)  SpO2: 98%   Filed Weights   01/31/21 1251  Weight: 148 lb 6.4 oz (67.3 kg)    GENERAL:alert, no distress and comfortable SKIN: skin color, texture, turgor are normal, no rashes or significant lesions EYES: normal, conjunctiva are pink and non-injected, sclera clear OROPHARYNX:no exudate, normal lips, buccal mucosa, and tongue  NECK: supple, thyroid normal size, non-tender, without nodularity LYMPH:  no palpable lymphadenopathy in the cervical, axillary or inguinal LUNGS: clear to auscultation and percussion with normal breathing effort HEART: regular rate & rhythm and no murmurs without lower extremity edema ABDOMEN:abdomen soft, non-tender and normal bowel sounds.  Mildly distended with palpable ascites Musculoskeletal:no cyanosis of digits and no clubbing  PSYCH: alert & oriented x 3  with fluent speech NEURO: no focal motor/sensory deficits  LABORATORY DATA:  I have reviewed the data as listed Lab Results  Component Value Date   WBC 5.3 01/05/2021   HGB 12.2 01/05/2021   HCT 38.8 01/05/2021   MCV 95.8 01/05/2021   PLT 363 01/05/2021   Recent Labs    01/05/21 0908  NA 137  K 4.2  CL 108  CO2 23  GLUCOSE 89  BUN 19  CREATININE 0.61  CALCIUM 9.5  GFRNONAA >60    RADIOGRAPHIC STUDIES: I reviewed imaging study with the patient and her sister I have personally reviewed the radiological images as listed and agreed with the findings in the report. NM PET Image Initial (PI) Skull Base To Thigh  Result Date: 01/31/2021 CLINICAL DATA:  Initial treatment strategy for cancer of unknown primary. EXAM: NUCLEAR MEDICINE PET SKULL BASE TO THIGH TECHNIQUE: 7.3 mCi  F-18 FDG was injected intravenously. Full-ring PET imaging was performed from the skull base to thigh after the radiotracer. CT data was obtained and used for attenuation correction and anatomic localization. Fasting blood glucose: 88 mg/dl COMPARISON:  06/05/2013. FINDINGS: Mediastinal blood pool activity: SUV max 2.97 Liver activity: SUV max NA NECK: No hypermetabolic lymph nodes in the neck. Incidental CT findings: none CHEST: Enlarged and FDG avid lymph nodes are identified. Index anterior mediastinal node measures 1.1 cm within SUV max of 10.84, image 62/4. Right paratracheal lymph node measures 1.6 cm and has an SUV max 10.4., image 56/4. Internal mammary lymph node on the right measures 4 mm and has an SUV max of 3.26, image 63/4. Small right pleural effusion. Calcified granuloma noted in the right middle lobe. No FDG avid pulmonary nodule or mass. Incidental CT findings: Mild cardiac enlargement. Aortic atherosclerosis. ABDOMEN/PELVIS: FDG avid retrocrural lymph node measures 0.6 cm and has an SUV max of 6.42, image 98/4. FDG avid aortocaval lymph node measures 0.9 cm and has an SUV max of 7.15, image 134/4. FDG  avid left pelvic sidewall node measures 0.8 cm with SUV max of 8.0, image 165/4. There is extensive FDG avid peritoneal disease involving the abdomen and pelvis: -Intensely hypermetabolic omental cake measures 2 cm in thickness with SUV max of 10.64, image 161/4. -index lesion along the lateral aspect of the right hepatic lobe measures 3.9 x 1.2 cm with SUV max 11.96, image 111/4. -index peritoneal nodule in the left upper quadrant of the abdomen measures 1.1 cm with SUV max 7.16, image 118/4. -peritoneal nodule within the central pelvis measures 1.7 cm with SUV max of 6.45, image 166/4. -peritoneal lesion within the left posterior pelvis measures 2.2 x 1.4 cm with SUV max of 9.52, image 177/4. Moderate volume of ascites identified within the pelvis. Incidental CT findings: Mild aortic atherosclerosis. SKELETON: No focal hypermetabolic activity to suggest skeletal metastasis. Incidental CT findings: none IMPRESSION: 1. Examination is positive for extensive FDG avid peritoneal disease within the abdomen and pelvis including multiple implants upon the surface of liver and extensive omental caking. Moderate ascites noted within the pelvis. 2. FDG avid retroperitoneal, retrocrural, right internal mammary, and mediastinal lymph nodes compatible with metastatic adenopathy. 3. Small right pleural effusion. 4.  Aortic Atherosclerosis (ICD10-I70.0). Electronically Signed   By: Kerby Moors M.D.   On: 01/31/2021 09:19   CT BIOPSY  Result Date: 01/05/2021 INDICATION: History of colon cancer, now with omental thickening worrisome for metastatic disease. Please perform omental biopsy for tissue diagnostic purposes. EXAM: CT-GUIDED BIOPSY OF OMENTAL THICKENING INVOLVING THE VENTRAL ASPECT THE LOWER ABDOMEN/UPPER PELVIS. COMPARISON:  CT abdomen and pelvis-12/10/2020 MEDICATIONS: None. ANESTHESIA/SEDATION: Fentanyl 100 mcg IV; Versed 3 mg IV Sedation time: 14 minutes; The patient was continuously monitored during the  procedure by the interventional radiology nurse under my direct supervision. CONTRAST:  None. COMPLICATIONS: None immediate. PROCEDURE: Informed consent was obtained from the patient following an explanation of the procedure, risks, benefits and alternatives. A time out was performed prior to the initiation of the procedure. The patient was positioned supine on the CT table and a limited CT was performed for procedural planning demonstrating unchanged appearance of omental thickening with dominant component measuring 13.0 x 12.5 cm (image 63, series 2). The procedure was planned. The operative site was prepped and draped in the usual sterile fashion. Appropriate trajectory was confirmed with a 22 gauge spinal needle after the adjacent tissues were anesthetized with 1% Lidocaine with epinephrine. Under intermittent CT  guidance, a 17 gauge coaxial needle was advanced into the internal right lateral aspect of the omental thickening. Appropriate positioning was confirmed and 6 core needle biopsy samples were obtained with an 18 gauge core needle biopsy device. The co-axial needle was removed following administration of a Gel-Foam slurry and superficial hemostasis was achieved with manual compression. A dressing was applied. The patient tolerated the procedure well without immediate postprocedural complication. IMPRESSION: Technically successful CT guided core needle biopsy of omental thickening within ventral aspect the lower abdomen/upper pelvis. Electronically Signed   By: Sandi Mariscal M.D.   On: 01/05/2021 14:20    ASSESSMENT & PLAN:  Primary peritoneal carcinomatosis ([place]) Her case is discussed at the GYN oncology tumor board this morning Additional tissue has been sent for Foundation One study; I will make sure HER2/neu is added We discussed the possibility of either she might have primary ovarian/fallopian tube cancer, primary peritoneal carcinomatosis or uterine cancer causing abnormal PET/CT imaging  findings I reviewed pathology report with the patient and gave her copies I reviewed PET CT imaging with the patient and her sister and gave her copies of the test results Despite stage IV disease at presentation, she is in excellent health with excellent performance status We discussed neoadjuvant chemotherapy approach with 3 cycles of carboplatin, paclitaxel plus minus Herceptin depending on test results in the future followed by interval repeat PET CT imaging and further discussion with GYN surgeon for possibility of interval debulking surgery if she have excellent response to treatment followed by 3 more cycles of chemotherapy She had genetic testing performed, test results pending  We reviewed the NCCN guidelines We discussed the role of chemotherapy. The intent is of curative intent.  We discussed some of the risks, benefits, side-effects of carboplatin & Taxol. Treatment is intravenous, every 3 weeks x 6 cycles  Some of the short term side-effects included, though not limited to, including weight loss, life threatening infections, risk of allergic reactions, need for transfusions of blood products, nausea, vomiting, change in bowel habits, loss of hair, admission to hospital for various reasons, and risks of death.   Long term side-effects are also discussed including risks of infertility, permanent damage to nerve function, hearing loss, chronic fatigue, kidney damage with possibility needing hemodialysis, and rare secondary malignancy including bone marrow disorders.  The patient is aware that the response rates discussed earlier is not guaranteed.  After a long discussion, patient made an informed decision to proceed with the prescribed plan of care.   Patient education material was dispensed. We discussed premedication with dexamethasone before chemotherapy.  At the end of the discussion, the patient is unsure she wants to pursue with chemotherapy given advanced age of disease She  would like to go home and think about it with her sister I will get GYN oncology navigator to follow on her decision tomorrow  History of colon cancer Colonoscopy was negative and biopsy come from GYN primary  Malignant ascites She has palpable ascites on exam but she is not symptomatic Observe for now  Pleural effusion on right She has minimum trace pleural effusion on the right but not symptomatic Observe for now  Goals of care, counseling/discussion We have extensive discussions about goals of care Quality of life is very important to her We discussed potential excellent prognosis, anticipated in patient with genetic mutations such as Lynch syndrome or BRCA mutation She will call me tomorrow once she has made decision about plan of care  No orders of the defined  types were placed in this encounter.   All questions were answered. The patient knows to call the clinic with any problems, questions or concerns. The total time spent in the appointment was 80 minutes encounter with patients including review of chart and various tests results, discussions about plan of care and coordination of care plan   Heath Lark, MD 01/31/2021 5:36 PM

## 2021-01-31 NOTE — Assessment & Plan Note (Signed)
We have extensive discussions about goals of care Quality of life is very important to her We discussed potential excellent prognosis, anticipated in patient with genetic mutations such as Lynch syndrome or BRCA mutation She will call me tomorrow once she has made decision about plan of care

## 2021-01-31 NOTE — Assessment & Plan Note (Signed)
She has minimum trace pleural effusion on the right but not symptomatic Observe for now

## 2021-01-31 NOTE — Assessment & Plan Note (Signed)
Her case is discussed at the GYN oncology tumor board this morning Additional tissue has been sent for Foundation One study; I will make sure HER2/neu is added We discussed the possibility of either she might have primary ovarian/fallopian tube cancer, primary peritoneal carcinomatosis or uterine cancer causing abnormal PET/CT imaging findings I reviewed pathology report with the patient and gave her copies I reviewed PET CT imaging with the patient and her sister and gave her copies of the test results Despite stage IV disease at presentation, she is in excellent health with excellent performance status We discussed neoadjuvant chemotherapy approach with 3 cycles of carboplatin, paclitaxel plus minus Herceptin depending on test results in the future followed by interval repeat PET CT imaging and further discussion with GYN surgeon for possibility of interval debulking surgery if she have excellent response to treatment followed by 3 more cycles of chemotherapy She had genetic testing performed, test results pending  We reviewed the NCCN guidelines We discussed the role of chemotherapy. The intent is of curative intent.  We discussed some of the risks, benefits, side-effects of carboplatin & Taxol. Treatment is intravenous, every 3 weeks x 6 cycles  Some of the short term side-effects included, though not limited to, including weight loss, life threatening infections, risk of allergic reactions, need for transfusions of blood products, nausea, vomiting, change in bowel habits, loss of hair, admission to hospital for various reasons, and risks of death.   Long term side-effects are also discussed including risks of infertility, permanent damage to nerve function, hearing loss, chronic fatigue, kidney damage with possibility needing hemodialysis, and rare secondary malignancy including bone marrow disorders.  The patient is aware that the response rates discussed earlier is not guaranteed.  After a  long discussion, patient made an informed decision to proceed with the prescribed plan of care.   Patient education material was dispensed. We discussed premedication with dexamethasone before chemotherapy.  At the end of the discussion, the patient is unsure she wants to pursue with chemotherapy given advanced age of disease She would like to go home and think about it with her sister I will get GYN oncology navigator to follow on her decision tomorrow

## 2021-02-01 ENCOUNTER — Other Ambulatory Visit: Payer: Medicare Other

## 2021-02-01 ENCOUNTER — Ambulatory Visit: Payer: Medicare Other | Admitting: Hematology

## 2021-02-01 ENCOUNTER — Telehealth: Payer: Self-pay | Admitting: Oncology

## 2021-02-01 ENCOUNTER — Other Ambulatory Visit: Payer: Self-pay | Admitting: Hematology and Oncology

## 2021-02-01 DIAGNOSIS — C482 Malignant neoplasm of peritoneum, unspecified: Secondary | ICD-10-CM

## 2021-02-01 NOTE — Progress Notes (Signed)
START ON PATHWAY REGIMEN - Ovarian     A cycle is every 21 days:     Paclitaxel      Carboplatin   **Always confirm dose/schedule in your pharmacy ordering system**  Patient Characteristics: Preoperative or Nonsurgical Candidate (Clinical Staging), Newly Diagnosed, Neoadjuvant Therapy followed by Surgery BRCA Mutation Status: Awaiting Test Results Therapeutic Status: Preoperative or Nonsurgical Candidate (Clinical Staging) AJCC T Category: cT2 AJCC 8 Stage Grouping: IV AJCC N Category: cN1 AJCC M Category: cM1 Therapy Plan: Neoadjuvant Therapy followed by Surgery Intent of Therapy: Curative Intent, Discussed with Patient

## 2021-02-01 NOTE — Telephone Encounter (Signed)
Called [facility] to schedule appointments.  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 Alvy Bimler on 02/11/21.

## 2021-02-01 NOTE — Telephone Encounter (Signed)
She needs labs, see me, chemo class and the chemo scheduled I can see her on Friday 8/19 with labs and then chemo on 8/22 I will send LOS for chemo 8/22, can you schedule labs, see me and chemo class on 8/19?

## 2021-02-01 NOTE — Telephone Encounter (Signed)
Called Leslie Rivera regarding her decision about treatment.  She has decided to go forward with chemotherapy on Mondays.  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

## 2021-02-03 DIAGNOSIS — C189 Malignant neoplasm of colon, unspecified: Secondary | ICD-10-CM | POA: Diagnosis not present

## 2021-02-04 ENCOUNTER — Ambulatory Visit (HOSPITAL_COMMUNITY): Payer: Medicare Other

## 2021-02-07 ENCOUNTER — Telehealth: Payer: Self-pay | Admitting: Hematology and Oncology

## 2021-02-07 NOTE — Telephone Encounter (Signed)
Scheduled appt per 8/11 sch msg. Pt aware.

## 2021-02-08 ENCOUNTER — Encounter (HOSPITAL_COMMUNITY): Payer: Self-pay

## 2021-02-09 ENCOUNTER — Ambulatory Visit: Payer: Self-pay | Admitting: Licensed Clinical Social Worker

## 2021-02-09 ENCOUNTER — Encounter: Payer: Self-pay | Admitting: Licensed Clinical Social Worker

## 2021-02-09 ENCOUNTER — Telehealth: Payer: Self-pay | Admitting: Licensed Clinical Social Worker

## 2021-02-09 ENCOUNTER — Ambulatory Visit: Payer: Medicare Other | Admitting: Hematology

## 2021-02-09 ENCOUNTER — Other Ambulatory Visit: Payer: Medicare Other

## 2021-02-09 DIAGNOSIS — Z803 Family history of malignant neoplasm of breast: Secondary | ICD-10-CM

## 2021-02-09 DIAGNOSIS — Z1379 Encounter for other screening for genetic and chromosomal anomalies: Secondary | ICD-10-CM | POA: Insufficient documentation

## 2021-02-09 DIAGNOSIS — C482 Malignant neoplasm of peritoneum, unspecified: Secondary | ICD-10-CM

## 2021-02-09 DIAGNOSIS — Z808 Family history of malignant neoplasm of other organs or systems: Secondary | ICD-10-CM

## 2021-02-09 DIAGNOSIS — Z800 Family history of malignant neoplasm of digestive organs: Secondary | ICD-10-CM

## 2021-02-09 DIAGNOSIS — Z85038 Personal history of other malignant neoplasm of large intestine: Secondary | ICD-10-CM

## 2021-02-09 NOTE — Progress Notes (Signed)
Pharmacist Chemotherapy Monitoring - Initial Assessment    Anticipated start date: 02/16/21   The following has been reviewed per standard work regarding the patient's treatment regimen: [name] diagnosis, treatment plan and drug doses, and organ/hematologic function Lab orders and baseline tests specific to treatment regimen  The treatment plan start date, drug sequencing, and pre-medications Prior authorization status  Patient's documented medication list, including drug-drug interaction screen and prescriptions for anti-emetics and supportive care specific to the treatment regimen The drug concentrations, fluid compatibility, administration routes, and timing of the medications to be used [name] access for treatment and lifetime cumulative dose history, if applicable  [name] medication allergies and previous infusion related reactions, if applicable   Changes made to treatment plan:  treatment plan date  Follow up needed:  prescriptions needed for anti-emetics PA not initiated.    Kennith Center, Pharm.D., CPP 02/09/2021'@10'$ :47 AM

## 2021-02-09 NOTE — Progress Notes (Addendum)
GENETIC TEST RESULTS  HPI: Leslie Rivera was seen in the [facility] clinic due to a personal and family history of cancer and concern regarding a hereditary predisposition to cancer in the family. Please refer to the prior Genetics clinic note for more information regarding Leslie Rivera's medical and family histories and our assessment at the time.   FAMILY HISTORY:  We obtained a detailed, 4-generation family history.  Significant diagnoses are listed below: Family History  Problem Relation Age of Onset   Cancer Brother        internal melanoma   Cancer Maternal Grandmother        colon   Leslie Rivera does not have children. She had 1 brother, 1 sister. Her brother died at 67 from metastatic melanoma. She reports this was a rare melanoma where they could never find it on his skin. He had 1 daughter, she is now in her 50s. Patient's sister has had some non-melanoma skin cancers removed and is living at 71.   Leslie Rivera mother died at 63, no cancers. Patient had 2 maternal aunts, 2 maternal uncles. One aunt had a blood cancer, possibly lymphoma. Her two daughters had breast cancer in their 41s, however there was a lot of cancer on their father's side of the family. Maternal grandmother had colon cancer in her 28s-60s and died at 61. Grandfather died in his 29s.    Leslie Rivera father died at 74. She has limited information about his side. Patient had 7 paternal aunts/uncles. One aunt had lung cancer and history of smoking. Another aunt had cancer, unknown type. No known cousins with cancer. Paternal grandmother had jaw cancer and history of tobacco use. Paternal grandfather died in his 40s.   Leslie Rivera is unaware of previous family history of genetic testing for hereditary cancer risks. Patient's maternal ancestors are of Scottish/Irish/Nordic descent, and paternal ancestors are of unknown descent. There is no reported Ashkenazi Jewish ancestry. There is no known consanguinity.       GENETIC TESTING:  At the time of Leslie Rivera's visit, we recommended she pursue genetic testing for the [facility] CancerNext-Expanded+RNA panel.  The CancerNext-Expanded + RNAinsight gene panel offered by [employer] and includes sequencing and rearrangement analysis for the following 77 genes: IP, ALK, APC*, ATM*, AXIN2, BAP1, BARD1, BLM, BMPR1A, BRCA1*, BRCA2*, BRIP1*, CDC73, CDH1*,CDK4, CDKN1B, CDKN2A, CHEK2*, CTNNA1, DICER1, FANCC, FH, FLCN, GALNT12, KIF1B, LZTR1, MAX, MEN1, MET, MLH1*, MSH2*, MSH3, MSH6*, MUTYH*, NBN, NF1*, NF2, NTHL1, PALB2*, PHOX2B, PMS2*, POT1, PRKAR1A, PTCH1, PTEN*, RAD51C*, RAD51D*,RB1, RECQL, RET, SDHA, SDHAF2, SDHB, SDHC, SDHD, SMAD4, SMARCA4, SMARCB1, SMARCE1, STK11, SUFU, TMEM127, TP53*,TSC1, TSC2, VHL and XRCC2 (sequencing and deletion/duplication); EGFR, EGLN1, HOXB13, KIT, MITF, PDGFRA, POLD1 and POLE (sequencing only); EPCAM and GREM1 (deletion/duplication only).  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 This gene mutation increases risk to develop melanoma and kidney cancer.  We discussed that this result does not explain her personal history of cancer, but is still important to share with family members. This result suggests her cancers happened by chance like most do.   The MITF gene provides instructions for making a protein called microphthalmia-associated transcription factor. This protein plays a role in the development, survival, and function of certain types of cells. To carry out this role, the protein attaches to specific areas of DNA and helps control the activity of particular genes. On the basis of this action, the protein is called a transcription factor.  The MITF protein helps control the development  and function of pigment-producing cells called melanocytes. Within these cells, this protein also controls production of the pigment melanin, which contributes to hair, eye, and skin color. Melanocytes are also found  in the inner ear and play an important role in hearing. Additionally, the MITF protein regulates the development of specialized cells in the eye called retinal pigment epithelial cells. These cells nourish the retina, the part of the eye that detects light and color.   The MITF gene is considered a moderate risk gene for malignant melanoma. Although the exact lifetime risk for melanoma has not yet been established, it has been estimated to be approximately 2.5%.  It is estimated that individuals with a MITF gene mutation have at least a 5-fold risk (over that of the general population risk) to develop melanoma and kidney cancer. This risk can be managed with appropriate screening.   There are currently no published medical management guidelines for individuals at increased cancer risk due to a MITF mutation. We expect that medical management guidelines for the MITF gene may become available over time, thus we encouraged Leslie Rivera to contact us regularly for any updates.   Given the increased melanoma risk, studies suggest that it is reasonable to consider regular skin cancer screening. Of note, the article by Elmon Kirschner al. (2015) contains considerations for medical management, including sun protection, monthly self skin exams, and dermatologic surveillance.  Once under the care of a dermatologist, they can recommend the frequency of skin examinations, most commonly annually, but sometimes more frequent. The following are general medical recommendations for individuals who have mutations in the MITF gene.     Screening and Management Options: We discussed various risk management and reduction choices for individuals in high-risk categories, including increased screening, preventive surgeries and chemoprevention.   Regarding kidney cancer risk management: We suggest at least annual imaging (as determined by your Urologist) for kidney tumors and regular clinical exams with a Urologist.  We discussed that  many times imaging of the kidneys can find unexpected findings, many times that are benign.  Therefore, if renal screening commences patients should expect that any findings will need to be followed up upon and tracked.    Regarding melanoma risk management: Annual dermatology examination and prompt evaluation for any abnormal skin lesions beginning now. We suggest avoidance of sunburn and tanning. Annual Ophthalmologic/retinal exams for the infrequent occurrence of retinal melanoma.  FAMILY MEMBERS: It is important that all of Ms. Scholz's relatives (both men and women) know of the presence of this gene mutation. Site-specific genetic testing can sort out who in the family is at risk and who is not.   Ms. Siess siblings have a 50% chance to have inherited this mutation. We recommend they have genetic testing for this same mutation, as identifying the presence of this mutation would allow them to also take advantage of risk-reducing measures.   Summary: The information we discussed at the time of Ms. Kovacik's appointment and which is summarized in this letter is what is known as of this date. With the rapid pace of medical research, new discoveries may modify our assessment and approach to her family in the future. We, therefore, encourage Ms. Wann's to re-contact us periodically for information on advances in the field. Ms. Sciarra may also call us with additional questions or updates regarding her personal and family cancer history.  Our contact number was provided. Ms. Meritt questions were answered to her satisfaction today, and she knows she is welcome to call  anytime with additional questions.    Faith Rogue, MS, [facility] Genetic Counselor [email or web address] Phone: [phone]

## 2021-02-09 NOTE — Telephone Encounter (Signed)
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 Discussed this result, noting that it doesn't explain her cancers but may explain her brother's melanoma and will be important for family members to know about.

## 2021-02-10 ENCOUNTER — Inpatient Hospital Stay: Payer: Medicare Other

## 2021-02-11 ENCOUNTER — Encounter: Payer: Self-pay | Admitting: Hematology and Oncology

## 2021-02-11 ENCOUNTER — Other Ambulatory Visit: Payer: Self-pay | Admitting: Hematology and Oncology

## 2021-02-11 ENCOUNTER — Inpatient Hospital Stay: Payer: Medicare Other | Admitting: Hematology and Oncology

## 2021-02-11 ENCOUNTER — Other Ambulatory Visit: Payer: Medicare Other

## 2021-02-11 ENCOUNTER — Other Ambulatory Visit: Payer: Self-pay

## 2021-02-11 ENCOUNTER — Inpatient Hospital Stay: Payer: Medicare Other

## 2021-02-11 VITALS — BP 141/71 | HR 66 | Temp 98.1°F | Resp 18 | Ht 66.0 in | Wt 150.0 lb

## 2021-02-11 DIAGNOSIS — C482 Malignant neoplasm of peritoneum, unspecified: Secondary | ICD-10-CM

## 2021-02-11 DIAGNOSIS — R180 Malignant ascites: Secondary | ICD-10-CM | POA: Diagnosis not present

## 2021-02-11 DIAGNOSIS — Z7189 Other specified counseling: Secondary | ICD-10-CM | POA: Diagnosis not present

## 2021-02-11 DIAGNOSIS — E039 Hypothyroidism, unspecified: Secondary | ICD-10-CM | POA: Diagnosis not present

## 2021-02-11 DIAGNOSIS — Z79899 Other long term (current) drug therapy: Secondary | ICD-10-CM | POA: Diagnosis not present

## 2021-02-11 DIAGNOSIS — J910 Malignant pleural effusion: Secondary | ICD-10-CM | POA: Diagnosis not present

## 2021-02-11 DIAGNOSIS — Z800 Family history of malignant neoplasm of digestive organs: Secondary | ICD-10-CM | POA: Diagnosis not present

## 2021-02-11 DIAGNOSIS — D500 Iron deficiency anemia secondary to blood loss (chronic): Secondary | ICD-10-CM | POA: Diagnosis not present

## 2021-02-11 DIAGNOSIS — Z853 Personal history of malignant neoplasm of breast: Secondary | ICD-10-CM | POA: Diagnosis not present

## 2021-02-11 DIAGNOSIS — Z86010 Personal history of colonic polyps: Secondary | ICD-10-CM | POA: Diagnosis not present

## 2021-02-11 DIAGNOSIS — I700 Atherosclerosis of aorta: Secondary | ICD-10-CM | POA: Diagnosis not present

## 2021-02-11 DIAGNOSIS — Z85038 Personal history of other malignant neoplasm of large intestine: Secondary | ICD-10-CM | POA: Diagnosis not present

## 2021-02-11 LAB — CMP (CANCER CENTER ONLY)
ALT: 75 U/L — ABNORMAL HIGH (ref 0–44)
AST: 53 U/L — ABNORMAL HIGH (ref 15–41)
Albumin: 3.7 g/dL (ref 3.5–5.0)
Alkaline Phosphatase: 191 U/L — ABNORMAL HIGH (ref 38–126)
Anion gap: 10 (ref 5–15)
BUN: 17 mg/dL (ref 8–23)
CO2: 24 mmol/L (ref 22–32)
Calcium: 9.9 mg/dL (ref 8.9–10.3)
Chloride: 104 mmol/L (ref 98–111)
Creatinine: 0.78 mg/dL (ref 0.44–1.00)
GFR, Estimated: >60 mL/min (ref >60)
Glucose, Bld: 87 mg/dL (ref 70–99)
Potassium: 4.5 mmol/L (ref 3.5–5.1)
Sodium: 138 mmol/L (ref 135–145)
Total Bilirubin: 0.5 mg/dL (ref 0.3–1.2)
Total Protein: 8.7 g/dL — ABNORMAL HIGH (ref 6.5–8.1)

## 2021-02-11 LAB — CBC WITH DIFFERENTIAL (CANCER CENTER ONLY)
Abs Immature Granulocytes: 0.03 10*3/uL (ref 0.00–0.07)
Basophils Absolute: 0 10*3/uL (ref 0.0–0.1)
Basophils Relative: 1 %
Eosinophils Absolute: 0.1 10*3/uL (ref 0.0–0.5)
Eosinophils Relative: 1 %
HCT: 40.4 % (ref 36.0–46.0)
Hemoglobin: 13 g/dL (ref 12.0–15.0)
Immature Granulocytes: 1 %
Lymphocytes Relative: 34 %
Lymphs Abs: 2 10*3/uL (ref 0.7–4.0)
MCH: 30 pg (ref 26.0–34.0)
MCHC: 32.2 g/dL (ref 30.0–36.0)
MCV: 93.3 fL (ref 80.0–100.0)
Monocytes Absolute: 0.5 10*3/uL (ref 0.1–1.0)
Monocytes Relative: 8 %
Neutro Abs: 3.3 10*3/uL (ref 1.7–7.7)
Neutrophils Relative %: 55 %
Platelet Count: 388 10*3/uL (ref 150–400)
RBC: 4.33 MIL/uL (ref 3.87–5.11)
RDW: 13.5 % (ref 11.5–15.5)
WBC Count: 5.9 10*3/uL (ref 4.0–10.5)
nRBC: 0 % (ref 0.0–0.2)

## 2021-02-11 MED ORDER — DEXAMETHASONE 4 MG PO TABS: ORAL_TABLET | ORAL | 6 refills | Status: DC

## 2021-02-11 MED ORDER — PROCHLORPERAZINE MALEATE 10 MG PO TABS: 10.0000 mg | ORAL_TABLET | Freq: Four times a day (QID) | ORAL | 1 refills | Status: DC | PRN

## 2021-02-11 MED ORDER — ONDANSETRON HCL 8 MG PO TABS: 8.0000 mg | ORAL_TABLET | Freq: Three times a day (TID) | ORAL | 1 refills | Status: DC | PRN

## 2021-02-11 MED ORDER — ZOLPIDEM TARTRATE 10 MG PO TABS: 10.0000 mg | ORAL_TABLET | Freq: Every evening | ORAL | 0 refills | Status: DC | PRN

## 2021-02-11 NOTE — Progress Notes (Signed)
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[facility]) We had extensive discussions about plan of care We discussed the risk, benefits, side effects of carboplatin and paclitaxel and she is in agreement to proceed I reminded her premedication dexamethasone She will get repeat imaging study after 3 cycles of therapy We discussed importance of close communication for supportive measures during treatment  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   All questions were answered. The patient knows to call the clinic with any problems, questions or concerns. The total time spent in the appointment was 40 minutes encounter with patients including review of chart and various tests results, discussions about plan of care and coordination of care plan   Heath Lark, MD 02/11/2021 1:31 PM  INTERVAL HISTORY: Please see below for problem oriented charting. She returns with her sister for further follow-up She denies abdominal pain, worsening bloating or nausea She is willing to start treatment next week as discussed She has significant questions related to future plan of care I reviewed molecular study results with her  SUMMARY OF ONCOLOGIC HISTORY: Oncology History Overview Note  Normal MMR   Primary peritoneal carcinomatosis ([place])  01/05/2021  Pathology Results   SURGICAL PATHOLOGY  CASE: [phone]  PATIENT: [facility]  Surgical Pathology Report   Reason for Addendum #1:  Immunohistochemistry results  Reason for Addendum #2:  DNA Mismatch Repair IHC Results   Clinical History: history of colon cancer, now with omental thickening  (cm)   FINAL MICROSCOPIC DIAGNOSIS:   A. OMENTUM, NEEDLE CORE BIOPSY:  - Metastatic poorly differentiated adenocarcinoma.  See comment   COMMENT:   Immunohistochemical stains show that the tumor cells are positive for CK7 and negative for CDX2 and CK20.  This immunoprofile is nonspecific and differential diagnosis can include an upper gastrointestinal, breast and lung primary among other possibilities.  Only a very small fraction of primary colonic adenocarcinoma was immunoprofile.  Clinical and radiologic correlation suggested.   Additional immunohistochemical stains are performed and show that the tumor cells are positive for WT1, PAX8 and ER.  Immunostain for p53 shows a clonal overexpression pattern.  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  Clinical and radiologic correlation is suggested.    01/19/2021 Tumor Marker   Patient's tumor was tested for the following markers: CA-125. Results of the tumor marker test revealed 6790.   01/28/2021 PET scan   1. Examination is positive for extensive FDG avid peritoneal disease within the abdomen and pelvis including multiple implants upon the surface of liver and extensive omental caking. Moderate ascites noted within the pelvis. 2. FDG avid retroperitoneal, retrocrural, right internal mammary, and mediastinal lymph nodes compatible with metastatic adenopathy. 3. Small right pleural effusion. 4.  Aortic Atherosclerosis (ICD10-I70.0).     01/31/2021 Initial Diagnosis   Primary peritoneal carcinomatosis ([place])   01/31/2021 Cancer Staging   Staging form: Ovary, Fallopian  Tube,  and Primary Peritoneal Carcinoma, AJCC 8th Edition - Clinical stage from 01/31/2021: FIGO Stage IV (cT2b, cN1b, cM1) - Signed by Heath Lark, MD on 01/31/2021 Stage prefix: Initial diagnosis   02/16/2021 -  Chemotherapy    Patient is on Treatment Plan: OVARIAN CARBOPLATIN (AUC 6) / PACLITAXEL (175) Q21D X 6 CYCLES        Genetic Testing   Pathogenic variant in MITF called [id number] identified on the [facility] CancerNext-Expanded+RNA panel. Remainder of testing was negative/normal. The report date is 02/09/2021.  The CancerNext-Expanded + RNAinsight gene panel offered by [employer] and includes sequencing and rearrangement analysis for the following 77 genes: IP, ALK, APC*, ATM*, AXIN2, BAP1, BARD1, BLM, BMPR1A, BRCA1*, BRCA2*, BRIP1*, CDC73, CDH1*,CDK4, CDKN1B, CDKN2A, CHEK2*, CTNNA1, DICER1, FANCC, FH, FLCN, GALNT12, KIF1B, LZTR1, MAX, MEN1, MET, MLH1*, MSH2*, MSH3, MSH6*, MUTYH*, NBN, NF1*, NF2, NTHL1, PALB2*, PHOX2B, PMS2*, POT1, PRKAR1A, PTCH1, PTEN*, RAD51C*, RAD51D*,RB1, RECQL, RET, SDHA, SDHAF2, SDHB, SDHC, SDHD, SMAD4, SMARCA4, SMARCB1, SMARCE1, STK11, SUFU, TMEM127, TP53*,TSC1, TSC2, VHL and XRCC2 (sequencing and deletion/duplication); EGFR, EGLN1, HOXB13, KIT, MITF, PDGFRA, POLD1 and POLE (sequencing only); EPCAM and GREM1 (deletion/duplication only).     REVIEW OF SYSTEMS:   Constitutional: Denies fevers, chills or abnormal weight loss Eyes: Denies blurriness of vision Ears, nose, mouth, throat, and face: Denies mucositis or sore throat Respiratory: Denies cough, dyspnea or wheezes Cardiovascular: Denies palpitation, chest discomfort or lower extremity swelling Gastrointestinal:  Denies nausea, heartburn or change in bowel habits Skin: Denies abnormal skin rashes Lymphatics: Denies new lymphadenopathy or easy bruising Neurological:Denies numbness, tingling or new weaknesses Behavioral/Psych: Mood is stable, no new changes  All other systems were reviewed with the patient and are  negative.  I have reviewed the past medical history, past surgical history, social history and family history with the patient and they are unchanged from previous note.  ALLERGIES:  is allergic to barium-containing compounds, hydrocodone, and erythromycin.  MEDICATIONS:  Current Outpatient Medications  Medication Sig Dispense Refill   dexamethasone (DECADRON) 4 MG tablet Take 2 tabs at the night before and 2 tab the morning of chemotherapy, every 3 weeks, by mouth x 6 cycles 36 tablet 6   Artificial Tear Ointment (LUBRICANT EYE OP) Place 1-3 drops into both eyes 2 (two) times daily. Systain     calcium carbonate (OS-CAL) 600 MG TABS tablet Take 600 mg by mouth 2 (two) times daily with a meal.     cetirizine (ZYRTEC) 10 MG tablet Take 10 mg by mouth every 30 (thirty) days. As needed     Cholecalciferol (VITAMIN D) 2000 units CAPS Take 2,000 Units by mouth daily.     diazepam (VALIUM) 5 MG tablet Take 5 mg by mouth every 8 (eight) hours as needed for anxiety.      escitalopram (LEXAPRO) 10 MG tablet Take 10 mg by mouth daily.     levothyroxine (SYNTHROID, LEVOTHROID) 75 MCG tablet Take 75 mcg by mouth daily.  3   ondansetron (ZOFRAN) 8 MG tablet Take 1 tablet (8 mg total) by mouth every 8 (eight) hours as needed for refractory nausea / vomiting. 30 tablet 1   oxyCODONE-acetaminophen (PERCOCET/ROXICET) 5-325 MG tablet Take 1 tablet by mouth every 6 (six) hours as needed for severe pain. 20 tablet 0   Probiotic Product (PROBIOTIC DAILY PO) Take 1 capsule by mouth daily. Ultra Flora IB     prochlorperazine (COMPAZINE) 10 MG tablet Take 1 tablet (10 mg total) by mouth every 6 (six) hours as needed (Nausea or vomiting). [place]  tablet 1   valACYclovir (VALTREX) 1000 MG tablet Take 2,000 mg by mouth daily as needed (Fever Blister).     zolpidem (AMBIEN) 10 MG tablet Take 1 tablet (10 mg total) by mouth at bedtime as needed for sleep. 30 tablet 0   No current facility-administered medications for this  visit.    PHYSICAL EXAMINATION: ECOG PERFORMANCE STATUS: 1 - Symptomatic but completely ambulatory  Vitals:   02/11/21 0946  BP: (!) 141/71  Pulse: 66  Resp: 18  Temp: 98.1 F (36.7 C)  SpO2: 100%   Filed Weights   02/11/21 0946  Weight: 150 lb (68 kg)    GENERAL:alert, no distress and comfortable SKIN: skin color, texture, turgor are normal, no rashes or significant lesions EYES: normal, Conjunctiva are pink and non-injected, sclera clear OROPHARYNX:no exudate, no erythema and lips, buccal mucosa, and tongue normal  NECK: supple, thyroid normal size, non-tender, without nodularity LYMPH:  no palpable lymphadenopathy in the cervical, axillary or inguinal LUNGS: clear to auscultation and percussion with normal breathing effort HEART: regular rate & rhythm and no murmurs and no lower extremity edema ABDOMEN:abdomen soft, distended with probable ascites Musculoskeletal:no cyanosis of digits and no clubbing  NEURO: alert & oriented x 3 with fluent speech, no focal motor/sensory deficits  LABORATORY DATA:  I have reviewed the data as listed    Component Value Date/Time   NA 138 02/11/2021 1020   K 4.5 02/11/2021 1020   CL 104 02/11/2021 1020   CO2 24 02/11/2021 1020   GLUCOSE 87 02/11/2021 1020   BUN 17 02/11/2021 1020   CREATININE 0.78 02/11/2021 1020   CREATININE 0.68 03/14/2016 1350   CALCIUM 9.9 02/11/2021 1020   PROT 8.7 (H) 02/11/2021 1020   ALBUMIN 3.7 02/11/2021 1020   AST 53 (H) 02/11/2021 1020   ALT 75 (H) 02/11/2021 1020   ALKPHOS 191 (H) 02/11/2021 1020   BILITOT 0.5 02/11/2021 1020   GFRNONAA >60 02/11/2021 1020   GFRAA >60 03/19/2016 0455    No results found for: SPEP, UPEP  Lab Results  Component Value Date   WBC 5.9 02/11/2021   NEUTROABS 3.3 02/11/2021   HGB 13.0 02/11/2021   HCT 40.4 02/11/2021   MCV 93.3 02/11/2021   PLT 388 02/11/2021      Chemistry      Component Value Date/Time   NA 138 02/11/2021 1020   K 4.5 02/11/2021 1020    CL 104 02/11/2021 1020   CO2 24 02/11/2021 1020   BUN 17 02/11/2021 1020   CREATININE 0.78 02/11/2021 1020   CREATININE 0.68 03/14/2016 1350      Component Value Date/Time   CALCIUM 9.9 02/11/2021 1020   ALKPHOS 191 (H) 02/11/2021 1020   AST 53 (H) 02/11/2021 1020   ALT 75 (H) 02/11/2021 1020   BILITOT 0.5 02/11/2021 1020

## 2021-02-11 NOTE — Assessment & Plan Note (Signed)
I reviewed her recent molecular study She has no actionable mutations We discussed goals of care She is willing to proceed

## 2021-02-11 NOTE — Assessment & Plan Note (Signed)
She has detectable malignant ascites on exam but not symptomatic Observe closely

## 2021-02-11 NOTE — Assessment & Plan Note (Signed)
We had extensive discussions about plan of care We discussed the risk, benefits, side effects of carboplatin and paclitaxel and she is in agreement to proceed I reminded her premedication dexamethasone She will get repeat imaging study after 3 cycles of therapy We discussed importance of close communication for supportive measures during treatment

## 2021-02-12 LAB — CA 125: Cancer Antigen (CA) 125: 8668 U/mL — ABNORMAL HIGH (ref 0.0–38.1)

## 2021-02-15 ENCOUNTER — Encounter: Payer: Self-pay | Admitting: Hematology and Oncology

## 2021-02-15 MED FILL — Dexamethasone Sodium Phosphate Inj 100 MG/10ML: INTRAMUSCULAR | Qty: 1 | Status: AC

## 2021-02-15 MED FILL — Fosaprepitant Dimeglumine For IV Infusion 150 MG (Base Eq): INTRAVENOUS | Qty: 5 | Status: AC

## 2021-02-15 NOTE — Progress Notes (Signed)
Called pt to introduce myself as her Arboriculturist.  Unfortunately there aren't any foundations offering copay assistance for her Dx and the type of ins she has.  I informed her of the [employer], went over what it covers and gave her the income requirement.  Pt declined the grant at this time so I will request for the registration staff give her my card in case she changes her mind and for any questions or concerns she may have in the future.

## 2021-02-16 ENCOUNTER — Other Ambulatory Visit: Payer: Self-pay

## 2021-02-16 ENCOUNTER — Inpatient Hospital Stay: Payer: Medicare Other

## 2021-02-16 ENCOUNTER — Encounter: Payer: Self-pay | Admitting: Oncology

## 2021-02-16 VITALS — BP 117/67 | HR 71 | Temp 97.8°F | Resp 16 | Wt 150.0 lb

## 2021-02-16 DIAGNOSIS — J910 Malignant pleural effusion: Secondary | ICD-10-CM | POA: Diagnosis not present

## 2021-02-16 DIAGNOSIS — D500 Iron deficiency anemia secondary to blood loss (chronic): Secondary | ICD-10-CM | POA: Diagnosis not present

## 2021-02-16 DIAGNOSIS — C482 Malignant neoplasm of peritoneum, unspecified: Secondary | ICD-10-CM | POA: Diagnosis not present

## 2021-02-16 DIAGNOSIS — Z79899 Other long term (current) drug therapy: Secondary | ICD-10-CM | POA: Diagnosis not present

## 2021-02-16 DIAGNOSIS — E039 Hypothyroidism, unspecified: Secondary | ICD-10-CM | POA: Diagnosis not present

## 2021-02-16 DIAGNOSIS — I700 Atherosclerosis of aorta: Secondary | ICD-10-CM | POA: Diagnosis not present

## 2021-02-16 DIAGNOSIS — Z85038 Personal history of other malignant neoplasm of large intestine: Secondary | ICD-10-CM | POA: Diagnosis not present

## 2021-02-16 DIAGNOSIS — R180 Malignant ascites: Secondary | ICD-10-CM | POA: Diagnosis not present

## 2021-02-16 DIAGNOSIS — Z853 Personal history of malignant neoplasm of breast: Secondary | ICD-10-CM | POA: Diagnosis not present

## 2021-02-16 DIAGNOSIS — Z800 Family history of malignant neoplasm of digestive organs: Secondary | ICD-10-CM | POA: Diagnosis not present

## 2021-02-16 DIAGNOSIS — Z86010 Personal history of colonic polyps: Secondary | ICD-10-CM | POA: Diagnosis not present

## 2021-02-16 MED ORDER — SODIUM CHLORIDE 0.9 % IV SOLN
472.8000 mg | Freq: Once | INTRAVENOUS | Status: AC
  Administered 2021-02-16: 470 mg via INTRAVENOUS
  Filled 2021-02-16: qty 47

## 2021-02-16 MED ORDER — SODIUM CHLORIDE 0.9 % IV SOLN
175.0000 mg/m2 | Freq: Once | INTRAVENOUS | Status: AC
  Administered 2021-02-16: 312 mg via INTRAVENOUS
  Filled 2021-02-16: qty 52

## 2021-02-16 MED ORDER — DIPHENHYDRAMINE HCL 50 MG/ML IJ SOLN
25.0000 mg | Freq: Once | INTRAMUSCULAR | Status: AC
  Administered 2021-02-16: 25 mg via INTRAVENOUS
  Filled 2021-02-16: qty 1

## 2021-02-16 MED ORDER — SODIUM CHLORIDE 0.9 % IV SOLN
150.0000 mg | Freq: Once | INTRAVENOUS | Status: AC
  Administered 2021-02-16: 150 mg via INTRAVENOUS
  Filled 2021-02-16: qty 150

## 2021-02-16 MED ORDER — PALONOSETRON HCL INJECTION 0.25 MG/5ML
0.2500 mg | Freq: Once | INTRAVENOUS | Status: AC
  Administered 2021-02-16: 0.25 mg via INTRAVENOUS
  Filled 2021-02-16: qty 5

## 2021-02-16 MED ORDER — SODIUM CHLORIDE 0.9 % IV SOLN: Freq: Once | INTRAVENOUS | Status: AC

## 2021-02-16 MED ORDER — SODIUM CHLORIDE 0.9 % IV SOLN
10.0000 mg | Freq: Once | INTRAVENOUS | Status: AC
  Administered 2021-02-16: 10 mg via INTRAVENOUS
  Filled 2021-02-16: qty 10

## 2021-02-16 MED ORDER — FAMOTIDINE 20 MG IN NS 100 ML IVPB
20.0000 mg | Freq: Once | INTRAVENOUS | Status: AC
  Administered 2021-02-16: 20 mg via INTRAVENOUS
  Filled 2021-02-16: qty 100

## 2021-02-16 MED ORDER — ACETAMINOPHEN 325 MG PO TABS
650.0000 mg | ORAL_TABLET | Freq: Once | ORAL | Status: AC
Start: 2021-02-16 — End: 2021-02-16
  Administered 2021-02-16: 650 mg via ORAL
  Filled 2021-02-16: qty 2

## 2021-02-16 NOTE — Progress Notes (Signed)
Met with Leslie Rivera in the infusion room during her first infusion.  She is doing well and does not have any questions at this time.  Advised her to call if she has any questions or needs.

## 2021-02-16 NOTE — Patient Instructions (Addendum)
[facility]  Discharge Instructions: Thank you for choosing [facility] to provide your oncology and hematology care.   If you have a lab appointment with the [facility], please go directly to the [facility] and check in at the registration area.   Wear comfortable clothing and clothing appropriate for easy access to any Portacath or PICC line.   We strive to give you quality time with your provider. You may need to reschedule your appointment if you arrive late (15 or more minutes).  Arriving late affects you and other patients whose appointments are after yours.  Also, if you miss three or more appointments without notifying the office, you may be dismissed from the clinic at the provider's discretion.      For prescription refill requests, have your pharmacy contact our office and allow 72 hours for refills to be completed.    Today you received the following chemotherapy and/or immunotherapy agents: Paclitaxel/carboplatin.      To help prevent nausea and vomiting after your treatment, we encourage you to take your nausea medication as directed.  BELOW ARE SYMPTOMS THAT SHOULD BE REPORTED IMMEDIATELY: *FEVER GREATER THAN 100.4 F (38 C) OR HIGHER *CHILLS OR SWEATING *NAUSEA AND VOMITING THAT IS NOT CONTROLLED WITH YOUR NAUSEA MEDICATION *UNUSUAL SHORTNESS OF BREATH *UNUSUAL BRUISING OR BLEEDING *URINARY PROBLEMS (pain or burning when urinating, or frequent urination) *BOWEL PROBLEMS (unusual diarrhea, constipation, pain near the anus) TENDERNESS IN MOUTH AND THROAT WITH OR WITHOUT PRESENCE OF ULCERS (sore throat, sores in mouth, or a toothache) UNUSUAL RASH, SWELLING OR PAIN  UNUSUAL VAGINAL DISCHARGE OR ITCHING   Items with * indicate a potential emergency and should be followed up as soon as possible or go to the Emergency Department if any problems should occur.  Please show the CHEMOTHERAPY ALERT CARD or IMMUNOTHERAPY ALERT CARD  at check-in to the Emergency Department and triage nurse.  Should you have questions after your visit or need to cancel or reschedule your appointment, please contact [facility]  Dept: [phone]  and follow the prompts.  Office hours are 8:00 a.m. to 4:30 p.m. Monday - Friday. Please note that voicemails left after 4:00 p.m. may not be returned until the following business day.  We are closed weekends and major holidays. You have access to a nurse at all times for urgent questions. Please call the main number to the clinic Dept: [phone] and follow the prompts.   For any non-urgent questions, you may also contact your provider using MyChart. We now offer e-Visits for anyone 44 and older to request care online for non-urgent symptoms. For details visit [email or web address].   Also download the MyChart app! Go to the app store, search "MyChart", open the app, select [facility], and log in with your MyChart username and password.  Due to Covid, a mask is required upon entering the hospital/clinic. If you do not have a mask, one will be given to you upon arrival. For doctor visits, patients may have 1 support person aged 69 or older with them. For treatment visits, patients cannot have anyone with them due to current Covid guidelines and our immunocompromised population.   Paclitaxel injection What is this medication? PACLITAXEL (PAK li TAX el) is a chemotherapy drug. It targets fast dividing cells, like cancer cells, and causes these cells to die. This medicine is used to treat ovarian cancer, breast cancer, lung cancer, Kaposi's sarcoma, andother cancers. This medicine may be  used for other purposes; ask your health care provider orpharmacist if you have questions. COMMON BRAND NAME(S): Onxol, Taxol What should I tell my care team before I take this medication? They need to know if you have any of these conditions: history of irregular heartbeat liver  disease low blood counts, like low white cell, platelet, or red cell counts lung or breathing disease, like asthma tingling of the fingers or toes, or other nerve disorder an unusual or allergic reaction to paclitaxel, alcohol, polyoxyethylated castor oil, other chemotherapy, other medicines, foods, dyes, or preservatives pregnant or trying to get pregnant breast-feeding How should I use this medication? This drug is given as an infusion into a vein. It is administered in a hospitalor clinic by a specially trained health care professional. Talk to your pediatrician regarding the use of this medicine in children.Special care may be needed. Overdosage: If you think you have taken too much of this medicine contact apoison control center or emergency room at once. NOTE: This medicine is only for you. Do not share this medicine with others. What if I miss a dose? It is important not to miss your dose. Call your doctor or health careprofessional if you are unable to keep an appointment. What may interact with this medication? Do not take this medicine with any of the following medications: live virus vaccines This medicine may also interact with the following medications: antiviral medicines for hepatitis, HIV or AIDS certain antibiotics like erythromycin and clarithromycin certain medicines for fungal infections like ketoconazole and itraconazole certain medicines for seizures like carbamazepine, phenobarbital, phenytoin gemfibrozil nefazodone rifampin St. John's wort This list may not describe all possible interactions. Give your health care provider a list of all the medicines, herbs, non-prescription drugs, or dietary supplements you use. Also tell them if you smoke, drink alcohol, or use illegaldrugs. Some items may interact with your medicine. What should I watch for while using this medication? Your condition will be monitored carefully while you are receiving this medicine. You will  need important blood work done while you are taking thismedicine. This medicine can cause serious allergic reactions. To reduce your risk you will need to take other medicine(s) before treatment with this medicine. If you experience allergic reactions like skin rash, itching or hives, swelling of theface, lips, or tongue, tell your doctor or health care professional right away. In some cases, you may be given additional medicines to help with side effects.Follow all directions for their use. This drug may make you feel generally unwell. This is not uncommon, as chemotherapy can affect healthy cells as well as cancer cells. Report any side effects. Continue your course of treatment even though you feel ill unless yourdoctor tells you to stop. Call your doctor or health care professional for advice if you get a fever, chills or sore throat, or other symptoms of a cold or flu. Do not treat yourself. This drug decreases your body's ability to fight infections. Try toavoid being around people who are sick. This medicine may increase your risk to bruise or bleed. Call your doctor orhealth care professional if you notice any unusual bleeding. Be careful brushing and flossing your teeth or using a toothpick because you may get an infection or bleed more easily. If you have any dental work done,tell your dentist you are receiving this medicine. Avoid taking products that contain aspirin, acetaminophen, ibuprofen, naproxen, or ketoprofen unless instructed by your doctor. These medicines may hide afever. Do not become pregnant while taking this medicine.   Women should inform their doctor if they wish to become pregnant or think they might be pregnant. There is a potential for serious side effects to an unborn child. Talk to your health care professional or pharmacist for more information. Do not breast-feed aninfant while taking this medicine. Men are advised not to father a child while receiving this medicine. This  product may contain alcohol. Ask your pharmacist or healthcare provider if this medicine contains alcohol. Be sure to tell all healthcare providers you are taking this medicine. Certain medicines, like metronidazole and disulfiram, can cause an unpleasant reaction when taken with alcohol. The reaction includes flushing, headache, nausea, vomiting, sweating, and increased thirst. Thereaction can last from 30 minutes to several hours. What side effects may I notice from receiving this medication? Side effects that you should report to your doctor or health care professionalas soon as possible: allergic reactions like skin rash, itching or hives, swelling of the face, lips, or tongue breathing problems changes in vision fast, irregular heartbeat high or low blood pressure mouth sores pain, tingling, numbness in the hands or feet signs of decreased platelets or bleeding - bruising, pinpoint red spots on the skin, black, tarry stools, blood in the urine signs of decreased red blood cells - unusually weak or tired, feeling faint or lightheaded, falls signs of infection - fever or chills, cough, sore throat, pain or difficulty passing urine signs and symptoms of liver injury like dark yellow or brown urine; general ill feeling or flu-like symptoms; light-colored stools; loss of appetite; nausea; right upper belly pain; unusually weak or tired; yellowing of the eyes or skin swelling of the ankles, feet, hands unusually slow heartbeat Side effects that usually do not require medical attention (report to yourdoctor or health care professional if they continue or are bothersome): diarrhea hair loss loss of appetite muscle or joint pain nausea, vomiting pain, redness, or irritation at site where injected tiredness This list may not describe all possible side effects. Call your doctor for medical advice about side effects. You may report side effects to FDA at1-800-FDA-1088. Where should I keep my  medication? This drug is given in a hospital or clinic and will not be stored at home. NOTE: This sheet is a summary. It may not cover all possible information. If you have questions about this medicine, talk to your doctor, pharmacist, orhealth care provider.  2022 Elsevier/Gold Standard (2019-05-14 13:37:23)  Carboplatin injection What is this medication? CARBOPLATIN (KAR boe pla tin) is a chemotherapy drug. It targets fast dividing cells, like cancer cells, and causes these cells to die. This medicine is usedto treat ovarian cancer and many other cancers. This medicine may be used for other purposes; ask your health care provider orpharmacist if you have questions. COMMON BRAND NAME(S): Paraplatin What should I tell my care team before I take this medication? They need to know if you have any of these conditions: blood disorders hearing problems kidney disease recent or ongoing radiation therapy an unusual or allergic reaction to carboplatin, cisplatin, other chemotherapy, other medicines, foods, dyes, or preservatives pregnant or trying to get pregnant breast-feeding How should I use this medication? This drug is usually given as an infusion into a vein. It is administered in [facility] or clinic by a specially trained health care professional. Talk to your pediatrician regarding the use of this medicine in children.Special care may be needed. Overdosage: If you think you have taken too much of this medicine contact apoison control center or emergency room at  once. NOTE: This medicine is only for you. Do not share this medicine with others. What if I miss a dose? It is important not to miss a dose. Call your doctor or health careprofessional if you are unable to keep an appointment. What may interact with this medication? medicines for seizures medicines to increase blood counts like filgrastim, pegfilgrastim, sargramostim some antibiotics like amikacin, gentamicin, neomycin,  streptomycin, tobramycin vaccines Talk to your doctor or health care professional before taking any of thesemedicines: acetaminophen aspirin ibuprofen ketoprofen naproxen This list may not describe all possible interactions. Give your health care provider a list of all the medicines, herbs, non-prescription drugs, or dietary supplements you use. Also tell them if you smoke, drink alcohol, or use illegaldrugs. Some items may interact with your medicine. What should I watch for while using this medication? Your condition will be monitored carefully while you are receiving this medicine. You will need important blood work done while you are taking thismedicine. This drug may make you feel generally unwell. This is not uncommon, as chemotherapy can affect healthy cells as well as cancer cells. Report any side effects. Continue your course of treatment even though you feel ill unless yourdoctor tells you to stop. In some cases, you may be given additional medicines to help with side effects.Follow all directions for their use. Call your doctor or health care professional for advice if you get a fever, chills or sore throat, or other symptoms of a cold or flu. Do not treat yourself. This drug decreases your body's ability to fight infections. Try toavoid being around people who are sick. This medicine may increase your risk to bruise or bleed. Call your doctor orhealth care professional if you notice any unusual bleeding. Be careful brushing and flossing your teeth or using a toothpick because you may get an infection or bleed more easily. If you have any dental work done,tell your dentist you are receiving this medicine. Avoid taking products that contain aspirin, acetaminophen, ibuprofen, naproxen, or ketoprofen unless instructed by your doctor. These medicines may hide afever. Do not become pregnant while taking this medicine. Women should inform their doctor if they wish to become pregnant or think  they might be pregnant. There is a potential for serious side effects to an unborn child. Talk to your health care professional or pharmacist for more information. Do not breast-feed aninfant while taking this medicine. What side effects may I notice from receiving this medication? Side effects that you should report to your doctor or health care professionalas soon as possible: allergic reactions like skin rash, itching or hives, swelling of the face, lips, or tongue signs of infection - fever or chills, cough, sore throat, pain or difficulty passing urine signs of decreased platelets or bleeding - bruising, pinpoint red spots on the skin, black, tarry stools, nosebleeds signs of decreased red blood cells - unusually weak or tired, fainting spells, lightheadedness breathing problems changes in hearing changes in vision chest pain high blood pressure low blood counts - This drug may decrease the number of white blood cells, red blood cells and platelets. You may be at increased risk for infections and bleeding. nausea and vomiting pain, swelling, redness or irritation at the injection site pain, tingling, numbness in the hands or feet problems with balance, talking, walking trouble passing urine or change in the amount of urine Side effects that usually do not require medical attention (report to yourdoctor or health care professional if they continue or are bothersome): hair loss   loss of appetite metallic taste in the mouth or changes in taste This list may not describe all possible side effects. Call your doctor for medical advice about side effects. You may report side effects to FDA at1-800-FDA-1088. Where should I keep my medication? This drug is given in a hospital or clinic and will not be stored at home. NOTE: This sheet is a summary. It may not cover all possible information. If you have questions about this medicine, talk to your doctor, pharmacist, orhealth care provider.  2022  Elsevier/Gold Standard (2007-09-17 14:38:05)   

## 2021-02-17 ENCOUNTER — Telehealth: Payer: Self-pay | Admitting: *Deleted

## 2021-02-22 ENCOUNTER — Telehealth: Payer: Self-pay | Admitting: Oncology

## 2021-02-22 NOTE — Telephone Encounter (Signed)
Leslie Rivera called and asked if her treatment on 04/05/21 can be rescheduled to 04/12/21.  She has had a beach trip planned for the weekend of 10/14 and is worried about going right after her treatment.  Advised her that we will let her know if this is ok and call her back on Friday.  She also mentioned that she is having some aching, lack of energy and some heartburn after her first chemo treatment.  She is taking tylenol as needed, going to yoga and is going to try taking Tums.

## 2021-02-25 ENCOUNTER — Encounter: Payer: Self-pay | Admitting: Hematology and Oncology

## 2021-02-25 NOTE — Telephone Encounter (Signed)
I am planning to go to [place] that day How about 10/17?  Or I see her on 10/17 and chemo on 10/18

## 2021-02-25 NOTE — Telephone Encounter (Signed)
Pls send scheduling msg to move it to 10/20

## 2021-02-25 NOTE — Telephone Encounter (Signed)
Left a message regarding October chemotherapy.  Requested a return call.

## 2021-02-25 NOTE — Telephone Encounter (Signed)
Leslie Rivera said that she will be driving back on [id number] and will not be able to schedule apts that day.  She is open any other day that week if that would work.

## 2021-03-09 MED FILL — Fosaprepitant Dimeglumine For IV Infusion 150 MG (Base Eq): INTRAVENOUS | Qty: 5 | Status: AC

## 2021-03-09 MED FILL — Dexamethasone Sodium Phosphate Inj 100 MG/10ML: INTRAMUSCULAR | Qty: 1 | Status: AC

## 2021-03-10 ENCOUNTER — Inpatient Hospital Stay: Payer: Medicare Other

## 2021-03-10 ENCOUNTER — Inpatient Hospital Stay: Payer: Medicare Other | Admitting: Hematology and Oncology

## 2021-03-10 ENCOUNTER — Inpatient Hospital Stay: Payer: Medicare Other | Attending: Genetic Counselor

## 2021-03-10 ENCOUNTER — Encounter: Payer: Self-pay | Admitting: Hematology and Oncology

## 2021-03-10 ENCOUNTER — Other Ambulatory Visit: Payer: Self-pay | Admitting: Hematology and Oncology

## 2021-03-10 ENCOUNTER — Other Ambulatory Visit: Payer: Self-pay

## 2021-03-10 VITALS — HR 65

## 2021-03-10 DIAGNOSIS — C482 Malignant neoplasm of peritoneum, unspecified: Secondary | ICD-10-CM | POA: Diagnosis not present

## 2021-03-10 DIAGNOSIS — R180 Malignant ascites: Secondary | ICD-10-CM | POA: Diagnosis not present

## 2021-03-10 DIAGNOSIS — G620 Drug-induced polyneuropathy: Secondary | ICD-10-CM | POA: Diagnosis not present

## 2021-03-10 DIAGNOSIS — J90 Pleural effusion, not elsewhere classified: Secondary | ICD-10-CM | POA: Diagnosis not present

## 2021-03-10 DIAGNOSIS — Z79899 Other long term (current) drug therapy: Secondary | ICD-10-CM | POA: Diagnosis not present

## 2021-03-10 DIAGNOSIS — I808 Phlebitis and thrombophlebitis of other sites: Secondary | ICD-10-CM

## 2021-03-10 DIAGNOSIS — R739 Hyperglycemia, unspecified: Secondary | ICD-10-CM | POA: Diagnosis not present

## 2021-03-10 DIAGNOSIS — Z5111 Encounter for antineoplastic chemotherapy: Secondary | ICD-10-CM | POA: Insufficient documentation

## 2021-03-10 DIAGNOSIS — T451X5A Adverse effect of antineoplastic and immunosuppressive drugs, initial encounter: Secondary | ICD-10-CM

## 2021-03-10 DIAGNOSIS — I809 Phlebitis and thrombophlebitis of unspecified site: Secondary | ICD-10-CM | POA: Insufficient documentation

## 2021-03-10 DIAGNOSIS — T50905A Adverse effect of unspecified drugs, medicaments and biological substances, initial encounter: Secondary | ICD-10-CM

## 2021-03-10 LAB — CMP (CANCER CENTER ONLY)
ALT: 14 U/L (ref 0–44)
AST: 15 U/L (ref 15–41)
Albumin: 3.8 g/dL (ref 3.5–5.0)
Alkaline Phosphatase: 93 U/L (ref 38–126)
Anion gap: 11 (ref 5–15)
BUN: 23 mg/dL (ref 8–23)
CO2: 19 mmol/L — ABNORMAL LOW (ref 22–32)
Calcium: 9.5 mg/dL (ref 8.9–10.3)
Chloride: 108 mmol/L (ref 98–111)
Creatinine: 0.95 mg/dL (ref 0.44–1.00)
GFR, Estimated: >60 mL/min (ref >60)
Glucose, Bld: 189 mg/dL — ABNORMAL HIGH (ref 70–99)
Potassium: 4 mmol/L (ref 3.5–5.1)
Sodium: 138 mmol/L (ref 135–145)
Total Bilirubin: 0.4 mg/dL (ref 0.3–1.2)
Total Protein: 8.3 g/dL — ABNORMAL HIGH (ref 6.5–8.1)

## 2021-03-10 LAB — CBC WITH DIFFERENTIAL (CANCER CENTER ONLY)
Abs Immature Granulocytes: 0.03 10*3/uL (ref 0.00–0.07)
Basophils Absolute: 0 10*3/uL (ref 0.0–0.1)
Basophils Relative: 0 %
Eosinophils Absolute: 0 10*3/uL (ref 0.0–0.5)
Eosinophils Relative: 0 %
HCT: 36.5 % (ref 36.0–46.0)
Hemoglobin: 12 g/dL (ref 12.0–15.0)
Immature Granulocytes: 0 %
Lymphocytes Relative: 13 %
Lymphs Abs: 1.1 10*3/uL (ref 0.7–4.0)
MCH: 29.9 pg (ref 26.0–34.0)
MCHC: 32.9 g/dL (ref 30.0–36.0)
MCV: 91 fL (ref 80.0–100.0)
Monocytes Absolute: 0.1 10*3/uL (ref 0.1–1.0)
Monocytes Relative: 2 %
Neutro Abs: 7.4 10*3/uL (ref 1.7–7.7)
Neutrophils Relative %: 85 %
Platelet Count: 265 10*3/uL (ref 150–400)
RBC: 4.01 MIL/uL (ref 3.87–5.11)
RDW: 14.1 % (ref 11.5–15.5)
WBC Count: 8.7 10*3/uL (ref 4.0–10.5)
nRBC: 0 % (ref 0.0–0.2)

## 2021-03-10 MED ORDER — SODIUM CHLORIDE 0.9 % IV SOLN
10.0000 mg | Freq: Once | INTRAVENOUS | Status: AC
  Administered 2021-03-10: 10 mg via INTRAVENOUS
  Filled 2021-03-10: qty 10

## 2021-03-10 MED ORDER — FAMOTIDINE 20 MG IN NS 100 ML IVPB
20.0000 mg | Freq: Once | INTRAVENOUS | Status: AC
  Administered 2021-03-10: 20 mg via INTRAVENOUS
  Filled 2021-03-10: qty 100

## 2021-03-10 MED ORDER — ACETAMINOPHEN 325 MG PO TABS
650.0000 mg | ORAL_TABLET | Freq: Once | ORAL | Status: AC
  Administered 2021-03-10: 650 mg via ORAL
  Filled 2021-03-10: qty 2

## 2021-03-10 MED ORDER — SODIUM CHLORIDE 0.9 % IV SOLN
469.2000 mg | Freq: Once | INTRAVENOUS | Status: AC
  Administered 2021-03-10: 470 mg via INTRAVENOUS
  Filled 2021-03-10: qty 47

## 2021-03-10 MED ORDER — DEXAMETHASONE 4 MG PO TABS: ORAL_TABLET | ORAL | 6 refills | Status: DC

## 2021-03-10 MED ORDER — PALONOSETRON HCL INJECTION 0.25 MG/5ML
0.2500 mg | Freq: Once | INTRAVENOUS | Status: AC
  Administered 2021-03-10: 0.25 mg via INTRAVENOUS
  Filled 2021-03-10: qty 5

## 2021-03-10 MED ORDER — SODIUM CHLORIDE 0.9 % IV SOLN
140.0000 mg/m2 | Freq: Once | INTRAVENOUS | Status: AC
  Administered 2021-03-10: 246 mg via INTRAVENOUS
  Filled 2021-03-10: qty 41

## 2021-03-10 MED ORDER — DIPHENHYDRAMINE HCL 50 MG/ML IJ SOLN
25.0000 mg | Freq: Once | INTRAMUSCULAR | Status: AC
  Administered 2021-03-10: 25 mg via INTRAVENOUS
  Filled 2021-03-10: qty 1

## 2021-03-10 MED ORDER — SODIUM CHLORIDE 0.9 % IV SOLN
150.0000 mg | Freq: Once | INTRAVENOUS | Status: AC
  Administered 2021-03-10: 150 mg via INTRAVENOUS
  Filled 2021-03-10: qty 150

## 2021-03-10 MED ORDER — SODIUM CHLORIDE 0.9 % IV SOLN: Freq: Once | INTRAVENOUS | Status: AC

## 2021-03-10 NOTE — Assessment & Plan Note (Signed)
She had recent superficial thrombophlebitis after IV treatment but that has resolved If that recurs again, we discussed conservative approach

## 2021-03-10 NOTE — Assessment & Plan Note (Signed)
She had multiple side effects from chemotherapy, not unexpected For sensation of jittery related to premedication dexamethasone, I recommend reducing oral premedication dexamethasone to only the night before treatment. For her significant decreased energy level day 3-5, I recommend low-dose dexamethasone daily for 2 days For her peripheral neuropathy, and plan 20% dose reduction for Taxol We have extensive discussions about aggressive management and supportive care while on treatment She is in agreement to proceed with cycle 2 as above, with plan to repeat imaging after cycle 3

## 2021-03-10 NOTE — Assessment & Plan Note (Signed)
she has mild peripheral neuropathy, likely related to side effects of treatment. °I plan to reduce the dose of treatment as outlined above.  °I explained to the patient the rationale of this strategy and reassured the patient it would not compromise the efficacy of treatment ° °

## 2021-03-10 NOTE — Assessment & Plan Note (Signed)
Blood sugar is mildly elevated likely due to side effects of steroids Observe closely As above, for next & future cycles of treatment, we will reduce premed steroids

## 2021-03-10 NOTE — Patient Instructions (Signed)
[facility]   Discharge Instructions: Thank you for choosing [facility] to provide your oncology and hematology care.   If you have a lab appointment with the [facility], please go directly to the [facility] and check in at the registration area.   Wear comfortable clothing and clothing appropriate for easy access to any Portacath or PICC line.   We strive to give you quality time with your provider. You may need to reschedule your appointment if you arrive late (15 or more minutes).  Arriving late affects you and other patients whose appointments are after yours.  Also, if you miss three or more appointments without notifying the office, you may be dismissed from the clinic at the provider's discretion.      For prescription refill requests, have your pharmacy contact our office and allow 72 hours for refills to be completed.    Today you received the following chemotherapy and/or immunotherapy agents: carboplatin and paclitaxel.      To help prevent nausea and vomiting after your treatment, we encourage you to take your nausea medication as directed.  BELOW ARE SYMPTOMS THAT SHOULD BE REPORTED IMMEDIATELY: *FEVER GREATER THAN 100.4 F (38 C) OR HIGHER *CHILLS OR SWEATING *NAUSEA AND VOMITING THAT IS NOT CONTROLLED WITH YOUR NAUSEA MEDICATION *UNUSUAL SHORTNESS OF BREATH *UNUSUAL BRUISING OR BLEEDING *URINARY PROBLEMS (pain or burning when urinating, or frequent urination) *BOWEL PROBLEMS (unusual diarrhea, constipation, pain near the anus) TENDERNESS IN MOUTH AND THROAT WITH OR WITHOUT PRESENCE OF ULCERS (sore throat, sores in mouth, or a toothache) UNUSUAL RASH, SWELLING OR PAIN  UNUSUAL VAGINAL DISCHARGE OR ITCHING   Items with * indicate a potential emergency and should be followed up as soon as possible or go to the Emergency Department if any problems should occur.  Please show the CHEMOTHERAPY ALERT CARD or IMMUNOTHERAPY ALERT  CARD at check-in to the Emergency Department and triage nurse.  Should you have questions after your visit or need to cancel or reschedule your appointment, please contact [facility]  Dept: [phone]  and follow the prompts.  Office hours are 8:00 a.m. to 4:30 p.m. Monday - Friday. Please note that voicemails left after 4:00 p.m. may not be returned until the following business day.  We are closed weekends and major holidays. You have access to a nurse at all times for urgent questions. Please call the main number to the clinic Dept: [phone] and follow the prompts.   For any non-urgent questions, you may also contact your provider using MyChart. We now offer e-Visits for anyone 85 and older to request care online for non-urgent symptoms. For details visit [email or web address].   Also download the MyChart app! Go to the app store, search "MyChart", open the app, select [facility], and log in with your MyChart username and password.  Due to Covid, a mask is required upon entering the hospital/clinic. If you do not have a mask, one will be given to you upon arrival. For doctor visits, patients may have 1 support person aged 39 or older with them. For treatment visits, patients cannot have anyone with them due to current Covid guidelines and our immunocompromised population.

## 2021-03-10 NOTE — Assessment & Plan Note (Signed)
Her lung exams are satisfactory She is not symptomatic Observe

## 2021-03-10 NOTE — Progress Notes (Signed)
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 I plan to reduce the dose of treatment as outlined above.  I explained to the patient the rationale of this strategy and reassured the patient it would not compromise the efficacy of treatment   Drug-induced hyperglycemia Blood sugar is mildly elevated likely due to side effects of steroids Observe closely As above, for next & future cycles of treatment, we will reduce premed steroids  Superficial thrombophlebitis She had recent superficial thrombophlebitis after IV treatment but that has resolved If that recurs again, we discussed conservative approach  No orders of the defined types were placed in this  encounter.   All questions were answered. The patient knows to call the clinic with any problems, questions or concerns. The total time spent in the appointment was 40 minutes encounter with patients including review of chart and various tests results, discussions about plan of care and coordination of care plan   Heath Lark, MD 03/10/2021 1:42 PM  INTERVAL HISTORY: Please see below for problem oriented charting. she returns for treatment follow-up for cycle 2 of carboplatin and Taxol She returns today with her sister She had multiple different side effects after cycle 1 of treatment The first 2 days, she has good energy level and then lost energy completely for 4 to 5 days She have lost a lot of fluid weight Her abdomen is less distended and back to normal She has slow mentation/mental fog that also resolved after day 10 of treatment She noticed slight worsening peripheral neuropathy affecting her fingers bilaterally, but only in the first 3 fingers She had superficial thrombophlebitis at the IV site that resolved spontaneously She had multiple bowel movement after chemo Denies nausea vomiting She had sensation of feeling jittery for 24 to 48 hours after chemo Overall, the side effects are manageable We have discussions about the role of aggressive supportive care and ultimate goals of treatment  REVIEW OF SYSTEMS:   Constitutional: Denies fevers, chills or abnormal weight loss Eyes: Denies blurriness of vision Ears, nose, mouth, throat, and face: Denies mucositis or sore throat Respiratory: Denies cough, dyspnea or wheezes Cardiovascular: Denies palpitation, chest discomfort or lower extremity swelling Lymphatics: Denies new lymphadenopathy or easy bruising Behavioral/Psych: Mood is stable, no new changes  All other systems were reviewed with the patient and are negative.  I have reviewed the past medical history, past surgical history, social history and family  history with the  patient and they are unchanged from previous note.  ALLERGIES:  is allergic to barium-containing compounds, hydrocodone, and erythromycin.  MEDICATIONS:  Current Outpatient Medications  Medication Sig Dispense Refill   Artificial Tear Ointment (LUBRICANT EYE OP) Place 1-3 drops into both eyes 2 (two) times daily. Systain     calcium carbonate (OS-CAL) 600 MG TABS tablet Take 600 mg by mouth 2 (two) times daily with a meal.     cetirizine (ZYRTEC) 10 MG tablet Take 10 mg by mouth every 30 (thirty) days. As needed     Cholecalciferol (VITAMIN D) 2000 units CAPS Take 2,000 Units by mouth daily.     dexamethasone (DECADRON) 4 MG tablet Take 2 tabs at the night before chemotherapy, every 3 weeks, by mouth x 6 cycles 36 tablet 6   diazepam (VALIUM) 5 MG tablet Take 5 mg by mouth every 8 (eight) hours as needed for anxiety.      escitalopram (LEXAPRO) 10 MG tablet Take 10 mg by mouth daily.     levothyroxine (SYNTHROID, LEVOTHROID) 75 MCG tablet Take 75 mcg by mouth daily.  3   ondansetron (ZOFRAN) 8 MG tablet Take 1 tablet (8 mg total) by mouth every 8 (eight) hours as needed for refractory nausea / vomiting. 30 tablet 1   oxyCODONE-acetaminophen (PERCOCET/ROXICET) 5-325 MG tablet Take 1 tablet by mouth every 6 (six) hours as needed for severe pain. 20 tablet 0   Probiotic Product (PROBIOTIC DAILY PO) Take 1 capsule by mouth daily. Ultra Flora IB     prochlorperazine (COMPAZINE) 10 MG tablet Take 1 tablet (10 mg total) by mouth every 6 (six) hours as needed (Nausea or vomiting). 30 tablet 1   valACYclovir (VALTREX) 1000 MG tablet Take 2,000 mg by mouth daily as needed (Fever Blister).     zolpidem (AMBIEN) 10 MG tablet Take 1 tablet (10 mg total) by mouth at bedtime as needed for sleep. 30 tablet 0   No current facility-administered medications for this visit.   Facility-Administered Medications Ordered in Other Visits  Medication Dose Route Frequency Provider Last Rate Last Admin   CARBOplatin  (PARAPLATIN) 470 mg in sodium chloride 0.9 % 250 mL chemo infusion  470 mg Intravenous Once Alvy Bimler, Aarianna Hoadley, MD       PACLitaxel (TAXOL) 246 mg in sodium chloride 0.9 % 250 mL chemo infusion (> $RemoveBef'80mg'IsVbUyNAhm$ /m2)  140 mg/m2 (Treatment Plan Recorded) Intravenous Once Heath Lark, MD 97 mL/hr at 03/10/21 1331 246 mg at 03/10/21 1331    SUMMARY OF ONCOLOGIC HISTORY: Oncology History Overview Note  Normal MMR   Primary peritoneal carcinomatosis ([place])  01/05/2021 Pathology Results   SURGICAL PATHOLOGY  CASE: [id number]  PATIENT: [facility]  Surgical Pathology Report   Reason for Addendum #1:  Immunohistochemistry results  Reason for Addendum #2:  DNA Mismatch Repair IHC Results   Clinical History: history of colon cancer, now with omental thickening  (cm)   FINAL MICROSCOPIC DIAGNOSIS:   A. OMENTUM, NEEDLE CORE BIOPSY:  - Metastatic poorly differentiated adenocarcinoma.  See comment   COMMENT:   Immunohistochemical stains show that the tumor cells are positive for CK7 and negative for CDX2 and CK20.  This immunoprofile is nonspecific and differential diagnosis can include an upper gastrointestinal, breast and lung primary among other possibilities.  Only a very small fraction of primary colonic adenocarcinoma was immunoprofile.  Clinical and radiologic correlation suggested.   Additional immunohistochemical stains are performed and show that the tumor cells are positive for WT1, PAX8  and ER.  Immunostain for p53 shows a clonal overexpression pattern.  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  Clinical and radiologic correlation is suggested.    01/19/2021 Tumor Marker   Patient's tumor was tested for the following markers: CA-125. Results of the tumor marker test revealed [id number].   01/28/2021 PET scan   1. Examination is positive for extensive FDG avid peritoneal disease within the abdomen and pelvis including multiple  implants upon the surface of liver and extensive omental caking. Moderate ascites noted within the pelvis. 2. FDG avid retroperitoneal, retrocrural, right internal mammary, and mediastinal lymph nodes compatible with metastatic adenopathy. 3. Small right pleural effusion. 4.  Aortic Atherosclerosis (ICD10-I70.0).     01/31/2021 Initial Diagnosis   Primary peritoneal carcinomatosis ([place])   01/31/2021 Cancer Staging   Staging form: Ovary, Fallopian Tube, and Primary Peritoneal Carcinoma, AJCC 8th Edition - Clinical stage from 01/31/2021: FIGO Stage IV (cT2b, cN1b, cM1) - Signed by Heath Lark, MD on 01/31/2021 Stage prefix: Initial diagnosis   02/14/2021 Tumor Marker   Patient's tumor was tested for the following markers: CA-125. Results of the tumor marker test revealed [id number].   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 Remainder of testing was negative/normal. The report date is 02/09/2021.  The CancerNext-Expanded + RNAinsight gene panel offered by [employer] and includes sequencing and rearrangement analysis for the following 77 genes: IP, ALK, APC*, ATM*, AXIN2, BAP1, BARD1, BLM, BMPR1A, BRCA1*, BRCA2*, BRIP1*, CDC73, CDH1*,CDK4, CDKN1B, CDKN2A, CHEK2*, CTNNA1, DICER1, FANCC, FH, FLCN, GALNT12, KIF1B, LZTR1, MAX, MEN1, MET, MLH1*, MSH2*, MSH3, MSH6*, MUTYH*, NBN, NF1*, NF2, NTHL1, PALB2*, PHOX2B, PMS2*, POT1, PRKAR1A, PTCH1, PTEN*, RAD51C*, RAD51D*,RB1, RECQL, RET, SDHA, SDHAF2, SDHB, SDHC, SDHD, SMAD4, SMARCA4, SMARCB1, SMARCE1, STK11, SUFU, TMEM127, TP53*,TSC1, TSC2, VHL and XRCC2 (sequencing and deletion/duplication); EGFR, EGLN1, HOXB13, KIT, MITF, PDGFRA, POLD1 and POLE (sequencing only); EPCAM and GREM1 (deletion/duplication only).     PHYSICAL EXAMINATION: ECOG PERFORMANCE STATUS: 1  - Symptomatic but completely ambulatory  Vitals:   03/10/21 1055  BP: 134/71  Pulse: (!) 101  Resp: 18  Temp: (!) 97.3 F (36.3 C)  SpO2: 98%   Filed Weights   03/10/21 1055  Weight: 148 lb 3.2 oz (67.2 kg)    GENERAL:alert, no distress and comfortable SKIN: skin color, texture, turgor are normal, no rashes or significant lesions EYES: normal, Conjunctiva are pink and non-injected, sclera clear OROPHARYNX:no exudate, no erythema and lips, buccal mucosa, and tongue normal  NECK: supple, thyroid normal size, non-tender, without nodularity LYMPH:  no palpable lymphadenopathy in the cervical, axillary or inguinal LUNGS: clear to auscultation and percussion with normal breathing effort HEART: regular rate & rhythm and no murmurs and no lower extremity edema ABDOMEN:abdomen soft, non-tender and normal bowel sounds Musculoskeletal:no cyanosis of digits and no clubbing  NEURO: alert & oriented x 3 with fluent speech, no focal motor/sensory deficits  LABORATORY DATA:  I have reviewed the data as listed    Component Value Date/Time   NA 138 03/10/2021 1052   K 4.0 03/10/2021 1052   CL 108 03/10/2021 1052   CO2 19 (L) 03/10/2021 1052   GLUCOSE 189 (H) 03/10/2021 1052   BUN 23 03/10/2021 1052   CREATININE 0.95 03/10/2021 1052  CREATININE 0.68 03/14/2016 1350   CALCIUM 9.5 03/10/2021 1052   PROT 8.3 (H) 03/10/2021 1052   ALBUMIN 3.8 03/10/2021 1052   AST 15 03/10/2021 1052   ALT 14 03/10/2021 1052   ALKPHOS 93 03/10/2021 1052   BILITOT 0.4 03/10/2021 1052   GFRNONAA >60 03/10/2021 1052   GFRAA >60 03/19/2016 0455    No results found for: SPEP, UPEP  Lab Results  Component Value Date   WBC 8.7 03/10/2021   NEUTROABS 7.4 03/10/2021   HGB 12.0 03/10/2021   HCT 36.5 03/10/2021   MCV 91.0 03/10/2021   PLT 265 03/10/2021      Chemistry      Component Value Date/Time   NA 138 03/10/2021 1052   K 4.0 03/10/2021 1052   CL 108 03/10/2021 1052   CO2 19 (L) 03/10/2021  1052   BUN 23 03/10/2021 1052   CREATININE 0.95 03/10/2021 1052   CREATININE 0.68 03/14/2016 1350      Component Value Date/Time   CALCIUM 9.5 03/10/2021 1052   ALKPHOS 93 03/10/2021 1052   AST 15 03/10/2021 1052   ALT 14 03/10/2021 1052   BILITOT 0.4 03/10/2021 1052

## 2021-03-10 NOTE — Assessment & Plan Note (Signed)
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

## 2021-03-11 ENCOUNTER — Telehealth: Payer: Self-pay

## 2021-03-11 LAB — CA 125: Cancer Antigen (CA) 125: 2002 U/mL — ABNORMAL HIGH (ref 0.0–38.1)

## 2021-03-11 NOTE — Telephone Encounter (Signed)
-----   Message from Heath Lark, MD sent at 03/11/2021  7:59 AM EDT ----- Pls call her CA-125 dropped from over 8000 to 2000 yesterday

## 2021-03-11 NOTE — Telephone Encounter (Signed)
Called and given below message. She verbalized understanding. 

## 2021-03-14 ENCOUNTER — Other Ambulatory Visit: Payer: Self-pay | Admitting: Hematology and Oncology

## 2021-03-15 ENCOUNTER — Encounter: Payer: Self-pay | Admitting: Hematology and Oncology

## 2021-04-05 ENCOUNTER — Ambulatory Visit: Payer: Medicare Other | Admitting: Hematology and Oncology

## 2021-04-05 ENCOUNTER — Ambulatory Visit: Payer: Medicare Other

## 2021-04-05 ENCOUNTER — Other Ambulatory Visit: Payer: Medicare Other

## 2021-04-13 ENCOUNTER — Other Ambulatory Visit: Payer: Self-pay | Admitting: Hematology and Oncology

## 2021-04-13 MED FILL — Fosaprepitant Dimeglumine For IV Infusion 150 MG (Base Eq): INTRAVENOUS | Qty: 5 | Status: AC

## 2021-04-13 MED FILL — Dexamethasone Sodium Phosphate Inj 100 MG/10ML: INTRAMUSCULAR | Qty: 1 | Status: AC

## 2021-04-14 ENCOUNTER — Encounter: Payer: Self-pay | Admitting: Hematology and Oncology

## 2021-04-14 ENCOUNTER — Other Ambulatory Visit: Payer: Self-pay

## 2021-04-14 ENCOUNTER — Inpatient Hospital Stay: Payer: Medicare Other | Admitting: Hematology and Oncology

## 2021-04-14 ENCOUNTER — Inpatient Hospital Stay: Payer: Medicare Other

## 2021-04-14 ENCOUNTER — Inpatient Hospital Stay: Payer: Medicare Other | Attending: Genetic Counselor

## 2021-04-14 VITALS — BP 147/86 | HR 77 | Temp 98.4°F | Resp 18 | Ht 66.0 in | Wt 155.6 lb

## 2021-04-14 DIAGNOSIS — G620 Drug-induced polyneuropathy: Secondary | ICD-10-CM | POA: Diagnosis not present

## 2021-04-14 DIAGNOSIS — C482 Malignant neoplasm of peritoneum, unspecified: Secondary | ICD-10-CM

## 2021-04-14 DIAGNOSIS — J90 Pleural effusion, not elsewhere classified: Secondary | ICD-10-CM | POA: Diagnosis not present

## 2021-04-14 DIAGNOSIS — R180 Malignant ascites: Secondary | ICD-10-CM | POA: Diagnosis not present

## 2021-04-14 DIAGNOSIS — Z5111 Encounter for antineoplastic chemotherapy: Secondary | ICD-10-CM | POA: Insufficient documentation

## 2021-04-14 DIAGNOSIS — Z79899 Other long term (current) drug therapy: Secondary | ICD-10-CM | POA: Insufficient documentation

## 2021-04-14 DIAGNOSIS — D61818 Other pancytopenia: Secondary | ICD-10-CM | POA: Insufficient documentation

## 2021-04-14 DIAGNOSIS — I700 Atherosclerosis of aorta: Secondary | ICD-10-CM | POA: Diagnosis not present

## 2021-04-14 DIAGNOSIS — T451X5A Adverse effect of antineoplastic and immunosuppressive drugs, initial encounter: Secondary | ICD-10-CM

## 2021-04-14 DIAGNOSIS — Z9221 Personal history of antineoplastic chemotherapy: Secondary | ICD-10-CM | POA: Diagnosis not present

## 2021-04-14 LAB — CBC WITH DIFFERENTIAL (CANCER CENTER ONLY)
Abs Immature Granulocytes: 0.01 10*3/uL (ref 0.00–0.07)
Basophils Absolute: 0 10*3/uL (ref 0.0–0.1)
Basophils Relative: 0 %
Eosinophils Absolute: 0 10*3/uL (ref 0.0–0.5)
Eosinophils Relative: 0 %
HCT: 36.2 % (ref 36.0–46.0)
Hemoglobin: 11.8 g/dL — ABNORMAL LOW (ref 12.0–15.0)
Immature Granulocytes: 0 %
Lymphocytes Relative: 29 %
Lymphs Abs: 0.9 10*3/uL (ref 0.7–4.0)
MCH: 31.1 pg (ref 26.0–34.0)
MCHC: 32.6 g/dL (ref 30.0–36.0)
MCV: 95.5 fL (ref 80.0–100.0)
Monocytes Absolute: 0.1 10*3/uL (ref 0.1–1.0)
Monocytes Relative: 2 %
Neutro Abs: 2.1 10*3/uL (ref 1.7–7.7)
Neutrophils Relative %: 69 %
Platelet Count: 328 10*3/uL (ref 150–400)
RBC: 3.79 MIL/uL — ABNORMAL LOW (ref 3.87–5.11)
RDW: 18.3 % — ABNORMAL HIGH (ref 11.5–15.5)
WBC Count: 3.1 10*3/uL — ABNORMAL LOW (ref 4.0–10.5)
nRBC: 0 % (ref 0.0–0.2)

## 2021-04-14 LAB — CMP (CANCER CENTER ONLY)
ALT: 23 U/L (ref 0–44)
AST: 22 U/L (ref 15–41)
Albumin: 3.9 g/dL (ref 3.5–5.0)
Alkaline Phosphatase: 85 U/L (ref 38–126)
Anion gap: 12 (ref 5–15)
BUN: 21 mg/dL (ref 8–23)
CO2: 20 mmol/L — ABNORMAL LOW (ref 22–32)
Calcium: 9.7 mg/dL (ref 8.9–10.3)
Chloride: 108 mmol/L (ref 98–111)
Creatinine: 0.72 mg/dL (ref 0.44–1.00)
GFR, Estimated: >60 mL/min (ref >60)
Glucose, Bld: 132 mg/dL — ABNORMAL HIGH (ref 70–99)
Potassium: 4 mmol/L (ref 3.5–5.1)
Sodium: 140 mmol/L (ref 135–145)
Total Bilirubin: 0.4 mg/dL (ref 0.3–1.2)
Total Protein: 8 g/dL (ref 6.5–8.1)

## 2021-04-14 MED ORDER — SODIUM CHLORIDE 0.9 % IV SOLN: Freq: Once | INTRAVENOUS | Status: AC

## 2021-04-14 MED ORDER — SODIUM CHLORIDE 0.9 % IV SOLN
10.0000 mg | Freq: Once | INTRAVENOUS | Status: AC
  Administered 2021-04-14: 10 mg via INTRAVENOUS
  Filled 2021-04-14: qty 10

## 2021-04-14 MED ORDER — FAMOTIDINE 20 MG IN NS 100 ML IVPB
20.0000 mg | Freq: Once | INTRAVENOUS | Status: AC
  Administered 2021-04-14: 20 mg via INTRAVENOUS
  Filled 2021-04-14: qty 100

## 2021-04-14 MED ORDER — DIPHENHYDRAMINE HCL 50 MG/ML IJ SOLN
25.0000 mg | Freq: Once | INTRAMUSCULAR | Status: AC
  Administered 2021-04-14: 25 mg via INTRAVENOUS
  Filled 2021-04-14: qty 1

## 2021-04-14 MED ORDER — SODIUM CHLORIDE 0.9 % IV SOLN
470.0000 mg | Freq: Once | INTRAVENOUS | Status: AC
  Administered 2021-04-14: 470 mg via INTRAVENOUS
  Filled 2021-04-14: qty 47

## 2021-04-14 MED ORDER — SODIUM CHLORIDE 0.9 % IV SOLN
150.0000 mg | Freq: Once | INTRAVENOUS | Status: AC
  Administered 2021-04-14: 150 mg via INTRAVENOUS
  Filled 2021-04-14: qty 150

## 2021-04-14 MED ORDER — PALONOSETRON HCL INJECTION 0.25 MG/5ML
0.2500 mg | Freq: Once | INTRAVENOUS | Status: AC
  Administered 2021-04-14: 0.25 mg via INTRAVENOUS
  Filled 2021-04-14: qty 5

## 2021-04-14 MED ORDER — SODIUM CHLORIDE 0.9 % IV SOLN
105.0000 mg/m2 | Freq: Once | INTRAVENOUS | Status: AC
  Administered 2021-04-14: 192 mg via INTRAVENOUS
  Filled 2021-04-14: qty 32

## 2021-04-14 MED ORDER — ESCITALOPRAM OXALATE 10 MG PO TABS: 15.0000 mg | ORAL_TABLET | Freq: Every day | ORAL | 3 refills | Status: DC

## 2021-04-14 MED ORDER — ACETAMINOPHEN 325 MG PO TABS
650.0000 mg | ORAL_TABLET | Freq: Once | ORAL | Status: AC
  Administered 2021-04-14: 650 mg via ORAL
  Filled 2021-04-14: qty 2

## 2021-04-14 NOTE — Progress Notes (Signed)
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[facility]) We have extensive discussion about plan of care Peripheral neuropathy is bothering her although her symptoms are most consistent with neuropathy related to carpal tunnel syndrome that is worse while on treatment After much discussion, we are in agreement to reduce the dose of Taxol further She had excellent response to therapy clinically and based on tumor marker reduction I plan to repeat CT imaging in 2 weeks for further follow-up We discussed the role of interval debulking surgery if she have favorable response to therapy I will refer her to see Dr. Berline Lopes for discussion about surgery After today's dose, she is somewhat reluctant to proceed with further chemotherapy but after much consideration, is willing to consider single agent carboplatin only in the future  Peripheral neuropathy due to chemotherapy [facility]) she has mild peripheral neuropathy, likely related to side effects of treatment. I plan to reduce the dose of treatment as outlined above.  I explained to the patient the rationale of this strategy and reassured the patient it would not compromise the efficacy of treatment   Malignant ascites I do not appreciate any malignant ascites on exam  Pancytopenia, acquired [facility]) This is an expected side effects of treatment We will proceed with treatment without delay  Orders Placed This Encounter  Procedures   CT CHEST ABDOMEN PELVIS W CONTRAST    DO NOT GIVE ORAL CONTRAST due to allergy    Standing Status:   Future    Standing Expiration Date:   04/14/2022    Order Specific Question:   Preferred imaging location?    Answer:   [facility]    Order Specific  Question:   Radiology Contrast Protocol - do NOT remove file path    Answer:   [email or web address]    All questions were answered. The patient knows to call the clinic with any problems, questions or concerns. The total time spent in the appointment was 40 minutes encounter with patients including review of chart and various tests results, discussions about plan of care and coordination of care plan   Heath Lark, MD 04/14/2021 3:43 PM  INTERVAL HISTORY: Please see below for problem oriented charting. she returns for treatment follow-up to be seen prior to cycle 3 of carboplatin and paclitaxel for neoadjuvant chemotherapy for primary peritoneal carcinomatosis She is here with her sister She tolerated last cycle of therapy better but still bothered by peripheral neuropathy especially in her fingers Her appetite has improved and she has gained some weight She denies significant abdominal bloating She had regular bowel movement Denies recent infection, fever or chills The patient is very specific about her future plan of care and have questions related to risk of surgery  REVIEW OF SYSTEMS:   Constitutional: Denies fevers, chills or abnormal weight loss Eyes: Denies blurriness of vision Ears, nose, mouth, throat, and face: Denies mucositis or sore throat Respiratory: Denies cough, dyspnea or wheezes Cardiovascular: Denies palpitation, chest discomfort or lower extremity swelling Gastrointestinal:  Denies nausea, heartburn or change in bowel habits Skin: Denies abnormal skin rashes Lymphatics: Denies new lymphadenopathy or easy bruising Behavioral/Psych: Mood is stable, no new changes  All other systems were reviewed with the patient and are negative.  I have reviewed the past medical history, past surgical history, social history  and family history with the patient and they are unchanged from previous note.  ALLERGIES:  is allergic to  barium-containing compounds, hydrocodone, and erythromycin.  MEDICATIONS:  Current Outpatient Medications  Medication Sig Dispense Refill   Artificial Tear Ointment (LUBRICANT EYE OP) Place 1-3 drops into both eyes 2 (two) times daily. Systain     calcium carbonate (OS-CAL) 600 MG TABS tablet Take 600 mg by mouth 2 (two) times daily with a meal.     cetirizine (ZYRTEC) 10 MG tablet Take 10 mg by mouth every 30 (thirty) days. As needed     Cholecalciferol (VITAMIN D) 2000 units CAPS Take 2,000 Units by mouth daily.     diazepam (VALIUM) 5 MG tablet Take 5 mg by mouth every 8 (eight) hours as needed for anxiety.      escitalopram (LEXAPRO) 10 MG tablet Take 1.5 tablets (15 mg total) by mouth daily. 45 tablet 3   levothyroxine (SYNTHROID, LEVOTHROID) 75 MCG tablet Take 75 mcg by mouth daily.  3   ondansetron (ZOFRAN) 8 MG tablet Take 1 tablet (8 mg total) by mouth every 8 (eight) hours as needed for refractory nausea / vomiting. 30 tablet 1   oxyCODONE-acetaminophen (PERCOCET/ROXICET) 5-325 MG tablet Take 1 tablet by mouth every 6 (six) hours as needed for severe pain. 20 tablet 0   Probiotic Product (PROBIOTIC DAILY PO) Take 1 capsule by mouth daily. Ultra Flora IB     prochlorperazine (COMPAZINE) 10 MG tablet Take 1 tablet (10 mg total) by mouth every 6 (six) hours as needed (Nausea or vomiting). 30 tablet 1   valACYclovir (VALTREX) 1000 MG tablet Take 2,000 mg by mouth daily as needed (Fever Blister).     zolpidem (AMBIEN) 10 MG tablet TAKE 1 TABLET BY MOUTH AT BEDTIME AS NEEDED FOR SLEEP 30 tablet 0   No current facility-administered medications for this visit.   Facility-Administered Medications Ordered in Other Visits  Medication Dose Route Frequency Provider Last Rate Last Admin   CARBOplatin (PARAPLATIN) 470 mg in sodium chloride 0.9 % 250 mL chemo infusion  470 mg Intravenous Once Heath Lark, MD 594 mL/hr at 04/14/21 1522 470 mg at 04/14/21 1522    SUMMARY OF ONCOLOGIC  HISTORY: Oncology History Overview Note  Normal MMR   Primary peritoneal carcinomatosis ([place])  01/05/2021 Pathology Results   SURGICAL PATHOLOGY  CASE: [phone]  PATIENT: [facility]  Surgical Pathology Report   Reason for Addendum #1:  Immunohistochemistry results  Reason for Addendum #2:  DNA Mismatch Repair IHC Results   Clinical History: history of colon cancer, now with omental thickening  (cm)   FINAL MICROSCOPIC DIAGNOSIS:   A. OMENTUM, NEEDLE CORE BIOPSY:  - Metastatic poorly differentiated adenocarcinoma.  See comment   COMMENT:   Immunohistochemical stains show that the tumor cells are positive for CK7 and negative for CDX2 and CK20.  This immunoprofile is nonspecific and differential diagnosis can include an upper gastrointestinal, breast and lung primary among other possibilities.  Only a very small fraction of primary colonic adenocarcinoma was immunoprofile.  Clinical and radiologic correlation suggested.   Additional immunohistochemical stains are performed and show that the tumor cells are positive for WT1, PAX8 and ER.  Immunostain for p53 shows a clonal overexpression pattern.  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  Clinical and radiologic correlation is suggested.    01/19/2021 Tumor Marker   Patient's tumor was tested for the following markers: CA-125. Results of the  tumor marker test revealed 6790.   01/28/2021 PET scan   1. Examination is positive for extensive FDG avid peritoneal disease within the abdomen and pelvis including multiple implants upon the surface of liver and extensive omental caking. Moderate ascites noted within the pelvis. 2. FDG avid retroperitoneal, retrocrural, right internal mammary, and mediastinal lymph nodes compatible with metastatic adenopathy. 3. Small right pleural effusion. 4.  Aortic Atherosclerosis (ICD10-I70.0).     01/31/2021 Initial Diagnosis    Primary peritoneal carcinomatosis ([place])   01/31/2021 Cancer Staging   Staging form: Ovary, Fallopian Tube, and Primary Peritoneal Carcinoma, AJCC 8th Edition - Clinical stage from 01/31/2021: FIGO Stage IV (cT2b, cN1b, cM1) - Signed by Heath Lark, MD on 01/31/2021 Stage prefix: Initial diagnosis   02/14/2021 Tumor Marker   Patient's tumor was tested for the following markers: CA-125. Results of the tumor marker test revealed 8668.   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 Remainder of testing was negative/normal. The report date is 02/09/2021.  The CancerNext-Expanded + RNAinsight gene panel offered by [employer] and includes sequencing and rearrangement analysis for the following 77 genes: IP, ALK, APC*, ATM*, AXIN2, BAP1, BARD1, BLM, BMPR1A, BRCA1*, BRCA2*, BRIP1*, CDC73, CDH1*,CDK4, CDKN1B, CDKN2A, CHEK2*, CTNNA1, DICER1, FANCC, FH, FLCN, GALNT12, KIF1B, LZTR1, MAX, MEN1, MET, MLH1*, MSH2*, MSH3, MSH6*, MUTYH*, NBN, NF1*, NF2, NTHL1, PALB2*, PHOX2B, PMS2*, POT1, PRKAR1A, PTCH1, PTEN*, RAD51C*, RAD51D*,RB1, RECQL, RET, SDHA, SDHAF2, SDHB, SDHC, SDHD, SMAD4, SMARCA4, SMARCB1, SMARCE1, STK11, SUFU, TMEM127, TP53*,TSC1, TSC2, VHL and XRCC2 (sequencing and deletion/duplication); EGFR, EGLN1, HOXB13, KIT, MITF, PDGFRA, POLD1 and POLE (sequencing only); EPCAM and GREM1 (deletion/duplication only).   03/10/2021 Tumor Marker   Patient's tumor was tested for the following markers: CA-125. Results of the tumor marker test revealed 2002.     PHYSICAL EXAMINATION: ECOG PERFORMANCE STATUS: 1 - Symptomatic but completely ambulatory  Vitals:   04/14/21 0932  BP: (!) 147/86  Pulse: 77  Resp: 18  Temp: 98.4 F (36.9 C)  SpO2: 98%   Filed Weights   04/14/21 0932  Weight: 155 lb 9.6 oz (70.6 kg)     GENERAL:alert, no distress and comfortable SKIN: skin color, texture, turgor are normal, no rashes or significant lesions EYES: normal, Conjunctiva are pink and non-injected, sclera clear OROPHARYNX:no exudate, no erythema and lips, buccal mucosa, and tongue normal  NECK: supple, thyroid normal size, non-tender, without nodularity LYMPH:  no palpable lymphadenopathy in the cervical, axillary or inguinal LUNGS: clear to auscultation and percussion with normal breathing effort HEART: regular rate & rhythm and no murmurs and no lower extremity edema ABDOMEN:abdomen soft, non-tender and normal bowel sounds.  I do not appreciate any ascites Musculoskeletal:no cyanosis of digits and no clubbing  NEURO: alert & oriented x 3 with fluent speech, no focal motor/sensory deficits  LABORATORY DATA:  I have reviewed the data as listed    Component Value Date/Time   NA 140 04/14/2021 0852   K 4.0 04/14/2021 0852   CL 108 04/14/2021 0852   CO2 20 (L) 04/14/2021 0852   GLUCOSE 132 (H) 04/14/2021 0852   BUN 21 04/14/2021 0852   CREATININE 0.72 04/14/2021 0852   CREATININE 0.68 03/14/2016 1350   CALCIUM 9.7 04/14/2021 0852   PROT 8.0 04/14/2021 0852   ALBUMIN 3.9 04/14/2021 0852   AST  22 04/14/2021 0852   ALT 23 04/14/2021 0852   ALKPHOS 85 04/14/2021 0852   BILITOT 0.4 04/14/2021 0852   GFRNONAA >60 04/14/2021 0852   GFRAA >60 03/19/2016 0455    No results found for: SPEP, UPEP  Lab Results  Component Value Date   WBC 3.1 (L) 04/14/2021   NEUTROABS 2.1 04/14/2021   HGB 11.8 (L) 04/14/2021   HCT 36.2 04/14/2021   MCV 95.5 04/14/2021   PLT 328 04/14/2021      Chemistry      Component Value Date/Time   NA 140 04/14/2021 0852   K 4.0 04/14/2021 0852   CL 108 04/14/2021 0852   CO2 20 (L) 04/14/2021 0852   BUN 21 04/14/2021 0852   CREATININE 0.72 04/14/2021 0852   CREATININE 0.68 03/14/2016 1350      Component Value Date/Time   CALCIUM 9.7 04/14/2021 0852   ALKPHOS 85  04/14/2021 0852   AST 22 04/14/2021 0852   ALT 23 04/14/2021 0852   BILITOT 0.4 04/14/2021 4315

## 2021-04-14 NOTE — Assessment & Plan Note (Signed)
This is an expected side effects of treatment We will proceed with treatment without delay

## 2021-04-14 NOTE — Assessment & Plan Note (Signed)
she has mild peripheral neuropathy, likely related to side effects of treatment. °I plan to reduce the dose of treatment as outlined above.  °I explained to the patient the rationale of this strategy and reassured the patient it would not compromise the efficacy of treatment ° °

## 2021-04-14 NOTE — Patient Instructions (Signed)
[facility]   Discharge Instructions: Thank you for choosing [facility] to provide your oncology and hematology care.   If you have a lab appointment with the [facility], please go directly to the [facility] and check in at the registration area.   Wear comfortable clothing and clothing appropriate for easy access to any Portacath or PICC line.   We strive to give you quality time with your provider. You may need to reschedule your appointment if you arrive late (15 or more minutes).  Arriving late affects you and other patients whose appointments are after yours.  Also, if you miss three or more appointments without notifying the office, you may be dismissed from the clinic at the provider's discretion.      For prescription refill requests, have your pharmacy contact our office and allow 72 hours for refills to be completed.    Today you received the following chemotherapy and/or immunotherapy agents: carboplatin and paclitaxel.      To help prevent nausea and vomiting after your treatment, we encourage you to take your nausea medication as directed.  BELOW ARE SYMPTOMS THAT SHOULD BE REPORTED IMMEDIATELY: *FEVER GREATER THAN 100.4 F (38 C) OR HIGHER *CHILLS OR SWEATING *NAUSEA AND VOMITING THAT IS NOT CONTROLLED WITH YOUR NAUSEA MEDICATION *UNUSUAL SHORTNESS OF BREATH *UNUSUAL BRUISING OR BLEEDING *URINARY PROBLEMS (pain or burning when urinating, or frequent urination) *BOWEL PROBLEMS (unusual diarrhea, constipation, pain near the anus) TENDERNESS IN MOUTH AND THROAT WITH OR WITHOUT PRESENCE OF ULCERS (sore throat, sores in mouth, or a toothache) UNUSUAL RASH, SWELLING OR PAIN  UNUSUAL VAGINAL DISCHARGE OR ITCHING   Items with * indicate a potential emergency and should be followed up as soon as possible or go to the Emergency Department if any problems should occur.  Please show the CHEMOTHERAPY ALERT CARD or IMMUNOTHERAPY ALERT  CARD at check-in to the Emergency Department and triage nurse.  Should you have questions after your visit or need to cancel or reschedule your appointment, please contact [facility]  Dept: [phone]  and follow the prompts.  Office hours are 8:00 a.m. to 4:30 p.m. Monday - Friday. Please note that voicemails left after 4:00 p.m. may not be returned until the following business day.  We are closed weekends and major holidays. You have access to a nurse at all times for urgent questions. Please call the main number to the clinic Dept: [phone] and follow the prompts.   For any non-urgent questions, you may also contact your provider using MyChart. We now offer e-Visits for anyone 85 and older to request care online for non-urgent symptoms. For details visit [email or web address].   Also download the MyChart app! Go to the app store, search "MyChart", open the app, select [facility], and log in with your MyChart username and password.  Due to Covid, a mask is required upon entering the hospital/clinic. If you do not have a mask, one will be given to you upon arrival. For doctor visits, patients may have 1 support person aged 39 or older with them. For treatment visits, patients cannot have anyone with them due to current Covid guidelines and our immunocompromised population.

## 2021-04-14 NOTE — Assessment & Plan Note (Signed)
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 Berline Lopes for discussion about surgery After today's dose, she is somewhat reluctant to proceed with further chemotherapy but after much consideration, is willing to consider single agent carboplatin only in the future

## 2021-04-14 NOTE — Assessment & Plan Note (Signed)
I do not appreciate any malignant ascites on exam

## 2021-04-15 ENCOUNTER — Telehealth: Payer: Self-pay

## 2021-04-15 LAB — CA 125: Cancer Antigen (CA) 125: 278 U/mL — ABNORMAL HIGH (ref 0.0–38.1)

## 2021-04-15 NOTE — Telephone Encounter (Signed)
Called and left below message. Ask her to call the office with questions. 

## 2021-04-15 NOTE — Telephone Encounter (Signed)
-----   Message from Heath Lark, MD sent at 04/15/2021 10:23 AM EDT ----- Pls let her know CA-125 had dropped to just over 200 from over 2000

## 2021-04-25 ENCOUNTER — Telehealth: Payer: Self-pay | Admitting: Oncology

## 2021-04-25 NOTE — Telephone Encounter (Signed)
Called Leslie Rivera and discussed that we will tentatively plan on 05/12/21 as her surgery date with Dr. Berline Lopes.  Also discussed that this can change based on her CT results from this week.  She verbalized understanding and agreement.

## 2021-04-26 ENCOUNTER — Encounter (HOSPITAL_BASED_OUTPATIENT_CLINIC_OR_DEPARTMENT_OTHER): Payer: Self-pay

## 2021-04-26 ENCOUNTER — Other Ambulatory Visit: Payer: Self-pay

## 2021-04-26 ENCOUNTER — Ambulatory Visit (HOSPITAL_BASED_OUTPATIENT_CLINIC_OR_DEPARTMENT_OTHER)
Admission: RE | Admit: 2021-04-26 | Discharge: 2021-04-26 | Disposition: A | Discharge status: Home / Self Care | Payer: Medicare Other | Source: Ambulatory Visit | Attending: Hematology and Oncology | Admitting: Hematology and Oncology

## 2021-04-26 DIAGNOSIS — C569 Malignant neoplasm of unspecified ovary: Secondary | ICD-10-CM | POA: Diagnosis not present

## 2021-04-26 DIAGNOSIS — C482 Malignant neoplasm of peritoneum, unspecified: Secondary | ICD-10-CM | POA: Insufficient documentation

## 2021-04-26 DIAGNOSIS — R180 Malignant ascites: Secondary | ICD-10-CM | POA: Diagnosis not present

## 2021-04-26 MED ORDER — IOHEXOL 300 MG/ML  SOLN
85.0000 mL | Freq: Once | INTRAMUSCULAR | Status: AC | PRN
  Administered 2021-04-26: 85 mL via INTRAVENOUS

## 2021-04-28 ENCOUNTER — Inpatient Hospital Stay: Payer: Medicare Other | Attending: Genetic Counselor | Admitting: Hematology and Oncology

## 2021-04-28 ENCOUNTER — Encounter: Payer: Self-pay | Admitting: Hematology and Oncology

## 2021-04-28 ENCOUNTER — Other Ambulatory Visit: Payer: Self-pay

## 2021-04-28 DIAGNOSIS — Z7189 Other specified counseling: Secondary | ICD-10-CM | POA: Diagnosis not present

## 2021-04-28 DIAGNOSIS — T451X5A Adverse effect of antineoplastic and immunosuppressive drugs, initial encounter: Secondary | ICD-10-CM

## 2021-04-28 DIAGNOSIS — C482 Malignant neoplasm of peritoneum, unspecified: Secondary | ICD-10-CM | POA: Diagnosis not present

## 2021-04-28 DIAGNOSIS — G620 Drug-induced polyneuropathy: Secondary | ICD-10-CM | POA: Diagnosis not present

## 2021-04-28 DIAGNOSIS — F419 Anxiety disorder, unspecified: Secondary | ICD-10-CM | POA: Diagnosis not present

## 2021-04-28 DIAGNOSIS — R5383 Other fatigue: Secondary | ICD-10-CM | POA: Insufficient documentation

## 2021-04-28 DIAGNOSIS — C778 Secondary and unspecified malignant neoplasm of lymph nodes of multiple regions: Secondary | ICD-10-CM | POA: Diagnosis not present

## 2021-04-28 DIAGNOSIS — R180 Malignant ascites: Secondary | ICD-10-CM | POA: Diagnosis not present

## 2021-04-28 DIAGNOSIS — E039 Hypothyroidism, unspecified: Secondary | ICD-10-CM | POA: Diagnosis not present

## 2021-04-28 DIAGNOSIS — Z85038 Personal history of other malignant neoplasm of large intestine: Secondary | ICD-10-CM | POA: Insufficient documentation

## 2021-04-28 DIAGNOSIS — Z79899 Other long term (current) drug therapy: Secondary | ICD-10-CM | POA: Insufficient documentation

## 2021-04-28 NOTE — Assessment & Plan Note (Signed)
We have significant discussions about goals of care The patient is willing to proceed with surgery and further treatment in the future

## 2021-04-28 NOTE — Progress Notes (Signed)
Sent message, via epic in basket, requesting orders in epic from surgeon.  

## 2021-04-28 NOTE — Assessment & Plan Note (Signed)
She denies worsening peripheral neuropathy since completion of treatment Observe for now

## 2021-04-28 NOTE — Assessment & Plan Note (Signed)
I have reviewed multiple imaging studies with the patient and her sister She has excellent response to therapy She had resolution of ascites and regression of lymphadenopathy seen on prior imaging She tolerated last cycle of treatment well but complicated by significant fatigue She denies worsening peripheral neuropathy The patient is keen to proceed with optimal debulking surgery Depending on the outcome of final pathology report, she would likely have further genetic testing performed on the tumor sample I plan to see her back within 2 weeks after her surgery to review final pathology report She is willing to consider further adjuvant treatment with single agent carboplatin only if possible

## 2021-04-28 NOTE — Progress Notes (Signed)
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[facility]) I have reviewed multiple imaging studies with the patient and her sister She has excellent response to therapy She had resolution of ascites and regression of lymphadenopathy seen on prior imaging She tolerated last cycle of treatment well but complicated by significant fatigue She denies worsening peripheral neuropathy The patient is keen to proceed with optimal debulking surgery Depending on the outcome of final pathology report, she would likely have further genetic testing performed on the tumor sample I plan to see her back within 2 weeks after her surgery to review final pathology report She is willing to consider further adjuvant treatment with single agent carboplatin only if possible  Peripheral neuropathy due to chemotherapy [facility]) She denies worsening peripheral neuropathy since completion of treatment Observe for now  Goals of care, counseling/discussion We have significant discussions about goals of care The patient is willing to proceed with surgery and further treatment in the future  No orders of the defined types were placed in this encounter.   All questions were answered. The patient knows to call the clinic with any problems, questions or concerns. The total time spent in the appointment was 30 minutes encounter with patients including review of chart and various tests results, discussions about plan of care and coordination of care plan   Heath Lark, MD 04/28/2021 4:14 PM  INTERVAL HISTORY: Please see below for problem oriented charting. she returns for treatment follow-up, to review imaging study after cycle 3 of treatment I ordered dose  reduction for her last cycle of treatment She appears to be tolerating that better but still complain of fatigue She denies worsening peripheral neuropathy Her appetite is fair No recent infection, fever or chills  REVIEW OF SYSTEMS:   Constitutional: Denies fevers, chills or abnormal weight loss Eyes: Denies blurriness of vision Ears, nose, mouth, throat, and face: Denies mucositis or sore throat Respiratory: Denies cough, dyspnea or wheezes Cardiovascular: Denies palpitation, chest discomfort or lower extremity swelling Gastrointestinal:  Denies nausea, heartburn or change in bowel habits Skin: Denies abnormal skin rashes Lymphatics: Denies new lymphadenopathy or easy bruising Behavioral/Psych: Mood is stable, no new changes  All other systems were reviewed with the patient and are negative.  I have reviewed the past medical history, past surgical history, social history and family history with the patient and they are unchanged from previous note.  ALLERGIES:  is allergic to barium-containing compounds, hydrocodone, and erythromycin.  MEDICATIONS:  Current Outpatient Medications  Medication Sig Dispense Refill   Artificial Tear Ointment (LUBRICANT EYE OP) Place 1-3 drops into both eyes 2 (two) times daily. Systain     calcium carbonate (OS-CAL) 600 MG TABS tablet Take 600 mg by mouth 2 (two) times daily with a meal.     cetirizine (ZYRTEC) 10 MG tablet Take 10 mg by mouth every 30 (thirty) days. As needed     Cholecalciferol (VITAMIN D) 2000 units CAPS Take 2,000 Units by mouth daily.     diazepam (VALIUM) 5 MG tablet Take 5 mg by mouth every 8 (eight) hours as needed for anxiety.      escitalopram (LEXAPRO) 10 MG tablet Take 1.5 tablets (15 mg total) by mouth daily. 45 tablet 3   levothyroxine (SYNTHROID, LEVOTHROID) 75 MCG tablet Take 75 mcg by  mouth daily.  3   ondansetron (ZOFRAN) 8 MG tablet Take 1 tablet (8 mg total) by mouth every 8 (eight) hours as needed for refractory  nausea / vomiting. 30 tablet 1   oxyCODONE-acetaminophen (PERCOCET/ROXICET) 5-325 MG tablet Take 1 tablet by mouth every 6 (six) hours as needed for severe pain. 20 tablet 0   Probiotic Product (PROBIOTIC DAILY PO) Take 1 capsule by mouth daily. Ultra Flora IB     prochlorperazine (COMPAZINE) 10 MG tablet Take 1 tablet (10 mg total) by mouth every 6 (six) hours as needed (Nausea or vomiting). 30 tablet 1   valACYclovir (VALTREX) 1000 MG tablet Take 2,000 mg by mouth daily as needed (Fever Blister).     zolpidem (AMBIEN) 10 MG tablet TAKE 1 TABLET BY MOUTH AT BEDTIME AS NEEDED FOR SLEEP 30 tablet 0   No current facility-administered medications for this visit.    SUMMARY OF ONCOLOGIC HISTORY: Oncology History Overview Note  Normal MMR   Primary peritoneal carcinomatosis ([place])  01/05/2021 Pathology Results   SURGICAL PATHOLOGY  CASE: [phone]  PATIENT: [facility]  Surgical Pathology Report   Reason for Addendum #1:  Immunohistochemistry results  Reason for Addendum #2:  DNA Mismatch Repair IHC Results   Clinical History: history of colon cancer, now with omental thickening  (cm)   FINAL MICROSCOPIC DIAGNOSIS:   A. OMENTUM, NEEDLE CORE BIOPSY:  - Metastatic poorly differentiated adenocarcinoma.  See comment   COMMENT:   Immunohistochemical stains show that the tumor cells are positive for CK7 and negative for CDX2 and CK20.  This immunoprofile is nonspecific and differential diagnosis can include an upper gastrointestinal, breast and lung primary among other possibilities.  Only a very small fraction of primary colonic adenocarcinoma was immunoprofile.  Clinical and radiologic correlation suggested.   Additional immunohistochemical stains are performed and show that the tumor cells are positive for WT1, PAX8 and ER.  Immunostain for p53 shows a clonal overexpression pattern.  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  Clinical and radiologic correlation is suggested.    01/19/2021 Tumor Marker   Patient's tumor was tested for the following markers: CA-125. Results of the tumor marker test revealed 6790.   01/28/2021 PET scan   1. Examination is positive for extensive FDG avid peritoneal disease within the abdomen and pelvis including multiple implants upon the surface of liver and extensive omental caking. Moderate ascites noted within the pelvis. 2. FDG avid retroperitoneal, retrocrural, right internal mammary, and mediastinal lymph nodes compatible with metastatic adenopathy. 3. Small right pleural effusion. 4.  Aortic Atherosclerosis (ICD10-I70.0).     01/31/2021 Initial Diagnosis   Primary peritoneal carcinomatosis ([place])   01/31/2021 Cancer Staging   Staging form: Ovary, Fallopian Tube, and Primary Peritoneal Carcinoma, AJCC 8th Edition - Clinical stage from 01/31/2021: FIGO Stage IV (cT2b, cN1b, cM1) - Signed by Heath Lark, MD on 01/31/2021 Stage prefix: Initial diagnosis    02/14/2021 Tumor Marker   Patient's tumor was tested for the following markers: CA-125. Results of the tumor marker test revealed 8668.   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 Remainder of testing was negative/normal. The report date is 02/09/2021.  The CancerNext-Expanded + RNAinsight gene panel offered by [facility] and includes  sequencing and rearrangement analysis for the following 77 genes: IP, ALK, APC*, ATM*, AXIN2, BAP1, BARD1, BLM, BMPR1A, BRCA1*, BRCA2*, BRIP1*, CDC73, CDH1*,CDK4, CDKN1B, CDKN2A, CHEK2*, CTNNA1, DICER1, FANCC, FH, FLCN, GALNT12, KIF1B, LZTR1, MAX, MEN1, MET, MLH1*, MSH2*, MSH3, MSH6*, MUTYH*, NBN, NF1*, NF2, NTHL1, PALB2*, PHOX2B, PMS2*, POT1, PRKAR1A, PTCH1, PTEN*, RAD51C*,  RAD51D*,RB1, RECQL, RET, SDHA, SDHAF2, SDHB, SDHC, SDHD, SMAD4, SMARCA4, SMARCB1, SMARCE1, STK11, SUFU, TMEM127, TP53*,TSC1, TSC2, VHL and XRCC2 (sequencing and deletion/duplication); EGFR, EGLN1, HOXB13, KIT, MITF, PDGFRA, POLD1 and POLE (sequencing only); EPCAM and GREM1 (deletion/duplication only).   03/10/2021 Tumor Marker   Patient's tumor was tested for the following markers: CA-125. Results of the tumor marker test revealed 2002.   04/14/2021 Tumor Marker   Patient's tumor was tested for the following markers: CA-125. Results of the tumor marker test revealed 278.   04/26/2021 Imaging   Significant decrease in peritoneal carcinomatosis since previous study, with resolution of ascites.   Decreased anterior mediastinal and retrocrural lymphadenopathy.   No new or progressive metastatic disease within the chest, abdomen, or pelvis.   Aortic Atherosclerosis (ICD10-I70.0).     PHYSICAL EXAMINATION: ECOG PERFORMANCE STATUS: 1 - Symptomatic but completely ambulatory  Vitals:   04/28/21 1346  BP: 132/66  Pulse: 99  Resp: 18  Temp: 98.9 F (37.2 C)  SpO2: 99%   Filed Weights   04/28/21 1346  Weight: 155 lb 3.2 oz (70.4 kg)    GENERAL:alert, no distress and comfortable NEURO: alert & oriented x 3 with fluent speech, no focal motor/sensory deficits  LABORATORY DATA:  I have reviewed the data as listed    Component Value Date/Time   NA 140 04/14/2021 0852   K 4.0 04/14/2021 0852   CL 108 04/14/2021 0852   CO2 20 (L) 04/14/2021 0852   GLUCOSE 132 (H) 04/14/2021 0852   BUN 21 04/14/2021 0852   CREATININE 0.72 04/14/2021 0852   CREATININE 0.68 03/14/2016 1350   CALCIUM 9.7 04/14/2021 0852   PROT 8.0 04/14/2021 0852   ALBUMIN 3.9 04/14/2021 0852   AST 22 04/14/2021 0852   ALT 23 04/14/2021 0852   ALKPHOS 85 04/14/2021 0852   BILITOT 0.4 04/14/2021 0852   GFRNONAA >60 04/14/2021 0852   GFRAA >60 03/19/2016 0455    No results found for: SPEP, UPEP  Lab Results   Component Value Date   WBC 3.1 (L) 04/14/2021   NEUTROABS 2.1 04/14/2021   HGB 11.8 (L) 04/14/2021   HCT 36.2 04/14/2021   MCV 95.5 04/14/2021   PLT 328 04/14/2021      Chemistry      Component Value Date/Time   NA 140 04/14/2021 0852   K 4.0 04/14/2021 0852   CL 108 04/14/2021 0852   CO2 20 (L) 04/14/2021 0852   BUN 21 04/14/2021 0852   CREATININE 0.72 04/14/2021 0852   CREATININE 0.68 03/14/2016 1350      Component Value Date/Time   CALCIUM 9.7 04/14/2021 0852   ALKPHOS 85 04/14/2021 0852   AST 22 04/14/2021 0852   ALT 23 04/14/2021 0852   BILITOT 0.4 04/14/2021 0852       RADIOGRAPHIC STUDIES: I have reviewed multiple imaging studies with the patient and her sister I have personally reviewed the radiological images as listed and agreed with the findings in the report. CT CHEST ABDOMEN PELVIS W CONTRAST  Result Date: 04/26/2021 CLINICAL DATA:  Follow-up ovarian carcinoma. Assess treatment response. Personal history of colon carcinoma. EXAM: CT CHEST, ABDOMEN, AND PELVIS WITH CONTRAST TECHNIQUE: Multidetector CT  imaging of the chest, abdomen and pelvis was performed following the standard protocol during bolus administration of intravenous contrast. CONTRAST:  73mL OMNIPAQUE IOHEXOL 300 MG/ML  SOLN COMPARISON:  PET-CT on 01/28/2021 FINDINGS: CT CHEST FINDINGS Cardiovascular: No acute findings. Mediastinum/Lymph Nodes: Pre-vascular mediastinal lymph node measures 9 mm on image 18/2, decreased from 1.6 cm on prior exam. Previously seen anterior mediastinal lymph node along the right posterior wall of the sternum is no longer visualized. Mild right retrocrural lymphadenopathy has also resolved. No new or increased areas of lymphadenopathy identified. Lungs/Pleura: No suspicious pulmonary nodules or masses identified. No evidence of infiltrate or pleural effusion. Musculoskeletal:  No suspicious bone lesions identified. CT ABDOMEN AND PELVIS FINDINGS Hepatobiliary: No masses  identified. Gallbladder is unremarkable. No evidence of biliary ductal dilatation. Pancreas:  No mass or inflammatory changes. Spleen:  Within normal limits in size and appearance. Adrenals/Urinary tract:  No masses or hydronephrosis. Stomach/Bowel: Stable postop changes from previous right colectomy. No evidence of obstruction, inflammatory process, or abnormal fluid collections. Vascular/Lymphatic: Stable sub-cm bilateral external iliac lymph nodes. No pathologically enlarged lymph nodes identified. No acute vascular findings. Aortic atherosclerotic calcification noted. Reproductive: There has been resolution of pelvic ascites since previous study. Several soft tissue nodules previously seen within the pelvic mesentery are no longer visualized. Significant decrease in omental soft tissue caking also demonstrated. Other:  None. Musculoskeletal:  No suspicious bone lesions identified. IMPRESSION: Significant decrease in peritoneal carcinomatosis since previous study, with resolution of ascites. Decreased anterior mediastinal and retrocrural lymphadenopathy. No new or progressive metastatic disease within the chest, abdomen, or pelvis. Aortic Atherosclerosis (ICD10-I70.0). Electronically Signed   By: Marlaine Hind M.D.   On: 04/26/2021 16:31

## 2021-04-29 ENCOUNTER — Encounter: Payer: Self-pay | Admitting: Gynecologic Oncology

## 2021-04-29 ENCOUNTER — Inpatient Hospital Stay: Payer: Medicare Other

## 2021-04-29 ENCOUNTER — Inpatient Hospital Stay (HOSPITAL_BASED_OUTPATIENT_CLINIC_OR_DEPARTMENT_OTHER): Payer: Medicare Other | Admitting: Gynecologic Oncology

## 2021-04-29 ENCOUNTER — Telehealth: Payer: Self-pay | Admitting: Oncology

## 2021-04-29 VITALS — BP 140/89 | HR 96 | Temp 98.0°F | Resp 18 | Ht 65.0 in | Wt 153.6 lb

## 2021-04-29 DIAGNOSIS — R5383 Other fatigue: Secondary | ICD-10-CM | POA: Diagnosis not present

## 2021-04-29 DIAGNOSIS — R180 Malignant ascites: Secondary | ICD-10-CM | POA: Diagnosis not present

## 2021-04-29 DIAGNOSIS — C482 Malignant neoplasm of peritoneum, unspecified: Secondary | ICD-10-CM | POA: Diagnosis not present

## 2021-04-29 DIAGNOSIS — Z79899 Other long term (current) drug therapy: Secondary | ICD-10-CM | POA: Diagnosis not present

## 2021-04-29 DIAGNOSIS — C778 Secondary and unspecified malignant neoplasm of lymph nodes of multiple regions: Secondary | ICD-10-CM | POA: Diagnosis not present

## 2021-04-29 DIAGNOSIS — C579 Malignant neoplasm of female genital organ, unspecified: Secondary | ICD-10-CM

## 2021-04-29 DIAGNOSIS — E039 Hypothyroidism, unspecified: Secondary | ICD-10-CM | POA: Diagnosis not present

## 2021-04-29 DIAGNOSIS — G620 Drug-induced polyneuropathy: Secondary | ICD-10-CM | POA: Diagnosis not present

## 2021-04-29 DIAGNOSIS — F419 Anxiety disorder, unspecified: Secondary | ICD-10-CM | POA: Diagnosis not present

## 2021-04-29 DIAGNOSIS — Z85038 Personal history of other malignant neoplasm of large intestine: Secondary | ICD-10-CM | POA: Diagnosis not present

## 2021-04-29 LAB — CBC WITH DIFFERENTIAL (CANCER CENTER ONLY)
Abs Immature Granulocytes: 0.02 10*3/uL (ref 0.00–0.07)
Basophils Absolute: 0 10*3/uL (ref 0.0–0.1)
Basophils Relative: 0 %
Eosinophils Absolute: 0.1 10*3/uL (ref 0.0–0.5)
Eosinophils Relative: 2 %
HCT: 33.5 % — ABNORMAL LOW (ref 36.0–46.0)
Hemoglobin: 11.1 g/dL — ABNORMAL LOW (ref 12.0–15.0)
Immature Granulocytes: 1 %
Lymphocytes Relative: 64 %
Lymphs Abs: 1.6 10*3/uL (ref 0.7–4.0)
MCH: 32.5 pg (ref 26.0–34.0)
MCHC: 33.1 g/dL (ref 30.0–36.0)
MCV: 98 fL (ref 80.0–100.0)
Monocytes Absolute: 0.4 10*3/uL (ref 0.1–1.0)
Monocytes Relative: 15 %
Neutro Abs: 0.5 10*3/uL — ABNORMAL LOW (ref 1.7–7.7)
Neutrophils Relative %: 18 %
Platelet Count: 213 10*3/uL (ref 150–400)
RBC: 3.42 MIL/uL — ABNORMAL LOW (ref 3.87–5.11)
RDW: 18.9 % — ABNORMAL HIGH (ref 11.5–15.5)
WBC Count: 2.5 10*3/uL — ABNORMAL LOW (ref 4.0–10.5)
nRBC: 0 % (ref 0.0–0.2)

## 2021-04-29 LAB — CMP (CANCER CENTER ONLY)
ALT: 21 U/L (ref 0–44)
AST: 22 U/L (ref 15–41)
Albumin: 3.9 g/dL (ref 3.5–5.0)
Alkaline Phosphatase: 75 U/L (ref 38–126)
Anion gap: 7 (ref 5–15)
BUN: 17 mg/dL (ref 8–23)
CO2: 25 mmol/L (ref 22–32)
Calcium: 9.7 mg/dL (ref 8.9–10.3)
Chloride: 108 mmol/L (ref 98–111)
Creatinine: 0.73 mg/dL (ref 0.44–1.00)
GFR, Estimated: >60 mL/min (ref >60)
Glucose, Bld: 88 mg/dL (ref 70–99)
Potassium: 4.7 mmol/L (ref 3.5–5.1)
Sodium: 140 mmol/L (ref 135–145)
Total Bilirubin: 0.4 mg/dL (ref 0.3–1.2)
Total Protein: 7.7 g/dL (ref 6.5–8.1)

## 2021-04-29 MED ORDER — SENNOSIDES-DOCUSATE SODIUM 8.6-50 MG PO TABS: 2.0000 | ORAL_TABLET | Freq: Every day | ORAL | 0 refills | Status: DC

## 2021-04-29 MED ORDER — ENOXAPARIN SODIUM 40 MG/0.4ML IJ SOSY: 40.0000 mg | PREFILLED_SYRINGE | INTRAMUSCULAR | 0 refills | Status: DC

## 2021-04-29 MED ORDER — IBUPROFEN 600 MG PO TABS
600.0000 mg | ORAL_TABLET | Freq: Four times a day (QID) | ORAL | 0 refills | Status: DC | PRN
Start: 2021-04-29 — End: 2021-06-30

## 2021-04-29 MED ORDER — OXYCODONE HCL 5 MG PO TABS: 5.0000 mg | ORAL_TABLET | ORAL | 0 refills | Status: DC | PRN

## 2021-04-29 NOTE — Telephone Encounter (Signed)
Left a message with co-pay amount for Lovenox.  Requested a return call with any questions.

## 2021-04-29 NOTE — Progress Notes (Signed)
GYNECOLOGIC ONCOLOGY NEW PATIENT CONSULTATION   Patient Name: Leslie Rivera  Patient Age: 75 y.o. 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  Reviewed significant improvement of metastatic disease and ascites on imaging. CA-125 has decreased substantially. Discussed role and goals of surgery. Given some persistent lymph nodes that are not able to be debulked due to location, I think it is reasonable to proceed with interval debulking surgery with plan for adjuvant chemotherapy.  The patient is firm today in her decision not to undergo further treatment with taxol, but she is willing to consider single agent carboplatin for adjuvant treatment.  We discussed the role of surgery in getting tissue to send for somatic testing. We discussed why tumor would be sent for mutation testing and HRD status. These results could show that she would be a candidate for maintenance PARPi treatment.   Her desire is to maximize quality of life. With low burden of disease on CT (planning to repeat CA-125 today which I expect to be near normal), I think that robotic IDS is very reasonable. Would plan for mini-lap for palpation of intra-abdominal surfaces.   We discussed possible adhesions in the setting of prior colon surgery (right colectomy) for colon cancer. Plan will be to enter through LUQ incision.   Will plan for 2 weeks of post-operative prophylactic lovenox; if surgery requires large incision, plan would be for 4 weeks.  We discussed plan for a robotic assisted hysterectomy, bilateral salpingo-oophorectomy, omentectomy, mini-lap, possible tumor debulking, possible laparotomy. The risks of surgery were discussed in detail and she understands these to  include infection; wound separation; hernia; vaginal cuff separation, injury to adjacent organs such as bowel, bladder, blood vessels, ureters and nerves; bleeding which may require blood transfusion; anesthesia risk; thromboembolic events; possible death; unforeseen complications; possible need for re-exploration; medical complications such as heart attack, stroke, pleural effusion and pneumonia. The patient will receive DVT and antibiotic prophylaxis as indicated. She voiced a clear understanding. She had the opportunity to ask questions. Perioperative instructions were reviewed with her. Prescriptions for post-op medications were sent to her pharmacy of choice.  A copy of this note was sent to the patient's referring provider.  82 minutes of total time was spent for this patient encounter, including preparation, face-to-face counseling with the patient and coordination of care, and documentation of the encounter.    Jeral Pinch, MD  Division of Gynecologic Oncology  Department of Obstetrics and Gynecology  [facility]  ___________________________________________  Chief Complaint: No chief complaint on file.   History of Present Illness:  Leslie Rivera is a 75 y.o. y.o. female who is seen in consultation at the request of Merrilee Seashore, MD for an evaluation of metastatic high-grade adenocarcinoma of GYN origin.  The patient initially presented with abdominal symptoms including bloating and changes to her bowel habit.  She underwent virtual colonoscopy which did not show colonic lesions but revealed carcinomatosis and ascites.  Patient then underwent CT-guided biopsy of omental nodularity.  Biopsy showed metastatic high-grade adenocarcinoma.  IHC nonspecific with differential diagnosis including upper GI, breast, and lung primary.  CEA and CA 19.9 were both normal, CEA-125 was markedly elevated at 6790.  PET scan was then performed showing extensive FDG  avid peritoneal disease including implants  on the surface of the liver and extensive omental caking.  Ascites was also noted.  FDG avid retroperitoneal, retrocrural, internal mammary, and mediastinal lymph nodes also consistent with metastatic adenopathy.  Small right pleural effusion seen.  Additional IHC stains were performed on the tumor which overall was consistent with a high-grade serous carcinoma of gynecologic or primary peritoneal origin.  Patient was then referred to see Dr. Alvy Bimler.  In the setting of stage IV disease, neoadjuvant chemotherapy was recommended.  Patient has now undergone 3 cycles of carboplatin and paclitaxel.  She has had significant improvement in her symptoms from presentation although has had some difficulty with chemotherapy, notably side effects related to Taxol.  Her Ca1 25 prior to and during treatment are noted below.  CA-125 7/27: 6,790 8/19: 8,668 9/15: 2,002 10/20: 278  Since starting treatment, the patient reports feeling "fuzzy", fatigued, and too weak and tired to exercise.  She has occasional dizziness.  Notes appetite has been okay, denies any nausea or emesis.  Reports regular bowel and bladder function.  Treatment History: Oncology History Overview Note  Normal MMR   Primary peritoneal carcinomatosis ([place])  01/05/2021 Pathology Results   SURGICAL PATHOLOGY  CASE: [id number]  PATIENT: [facility]  Surgical Pathology Report   Reason for Addendum #1:  Immunohistochemistry results  Reason for Addendum #2:  DNA Mismatch Repair IHC Results   Clinical History: history of colon cancer, now with omental thickening  (cm)   FINAL MICROSCOPIC DIAGNOSIS:   A. OMENTUM, NEEDLE CORE BIOPSY:  - Metastatic poorly differentiated adenocarcinoma.  See comment   COMMENT:   Immunohistochemical stains show that the tumor cells are positive for CK7 and negative for CDX2 and CK20.  This immunoprofile is nonspecific and differential diagnosis can include an  upper gastrointestinal, breast and lung primary among other possibilities.  Only a very small fraction of primary colonic adenocarcinoma was immunoprofile.  Clinical and radiologic correlation suggested.   Additional immunohistochemical stains are performed and show that the tumor cells are positive for WT1, PAX8 and ER.  Immunostain for p53 shows a clonal overexpression pattern.  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  Clinical and radiologic correlation is suggested.    01/19/2021 Tumor Marker   Patient's tumor was tested for the following markers: CA-125. Results of the tumor marker test revealed 6790.   01/28/2021 PET scan   1. Examination is positive for extensive FDG avid peritoneal disease within the abdomen and pelvis including multiple implants upon the surface of liver and extensive omental caking. Moderate ascites noted within the pelvis. 2. FDG avid retroperitoneal, retrocrural, right internal mammary, and mediastinal lymph nodes compatible with metastatic adenopathy. 3. Small right pleural effusion. 4.  Aortic Atherosclerosis (ICD10-I70.0).     01/31/2021 Initial Diagnosis   Primary peritoneal carcinomatosis ([place])   01/31/2021 Cancer Staging   Staging form: Ovary, Fallopian Tube, and Primary Peritoneal Carcinoma, AJCC 8th Edition - Clinical stage from 01/31/2021: FIGO Stage IV (cT2b, cN1b, cM1) - Signed by Heath Lark, MD on 01/31/2021 Stage prefix: Initial diagnosis    02/14/2021 Tumor Marker   Patient's tumor was tested for the following markers: CA-125. Results of the tumor marker test revealed 8668.   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  Remainder of testing was negative/normal. The report date is  02/09/2021.  The CancerNext-Expanded + RNAinsight gene panel offered by [employer] and includes sequencing and rearrangement analysis for the following 77 genes: IP, ALK, APC*, ATM*, AXIN2, BAP1, BARD1, BLM, BMPR1A, BRCA1*, BRCA2*, BRIP1*, CDC73, CDH1*,CDK4, CDKN1B, CDKN2A, CHEK2*, CTNNA1, DICER1, FANCC, FH, FLCN, GALNT12, KIF1B, LZTR1, MAX, MEN1, MET, MLH1*, MSH2*, MSH3, MSH6*, MUTYH*, NBN, NF1*, NF2, NTHL1, PALB2*, PHOX2B, PMS2*, POT1, PRKAR1A, PTCH1, PTEN*, RAD51C*, RAD51D*,RB1, RECQL, RET, SDHA, SDHAF2, SDHB, SDHC, SDHD, SMAD4, SMARCA4, SMARCB1, SMARCE1, STK11, SUFU, TMEM127, TP53*,TSC1, TSC2, VHL and XRCC2 (sequencing and deletion/duplication); EGFR, EGLN1, HOXB13, KIT, MITF, PDGFRA, POLD1 and POLE (sequencing only); EPCAM and GREM1 (deletion/duplication only).   03/10/2021 Tumor Marker   Patient's tumor was tested for the following markers: CA-125. Results of the tumor marker test revealed 2002.   04/14/2021 Tumor Marker   Patient's tumor was tested for the following markers: CA-125. Results of the tumor marker test revealed 278.   04/26/2021 Imaging   Significant decrease in peritoneal carcinomatosis since previous study, with resolution of ascites.   Decreased anterior mediastinal and retrocrural lymphadenopathy.   No new or progressive metastatic disease within the chest, abdomen, or pelvis.   Aortic Atherosclerosis (ICD10-I70.0).     PAST MEDICAL HISTORY:  Past Medical History:  Diagnosis Date   Abdominal pain    Adenomatous colon polyp    Anemia    Iron deficiency anemia   Anxiety    Cancer ([place])    colon   Colon cancer ([place]) 07/17/2013   Family history of breast cancer    Family history of colon cancer    Family history of melanoma    History of migraine headaches    Hypothyroidism    Internal hemorrhoids    Irregular heart beat    occ. PVC's   Periumbilical pain    Stomach cancer ([place])      PAST SURGICAL HISTORY:  Past Surgical History:  Procedure  Laterality Date   BREAST BIOPSY Right    BREAST SURGERY     removal abnormal cells in right   ESOPHAGOGASTRODUODENOSCOPY (EGD) WITH PROPOFOL N/A 12/21/2020   Procedure: ESOPHAGOGASTRODUODENOSCOPY (EGD) WITH PROPOFOL;  Surgeon: Carol Ada, MD;  Location: [facility] END[facility]COPY;  Service: Endoscopy;  Laterality: N/A;   EUS N/A 12/21/2020   Procedure: UPPER ENDOSCOPIC ULTRASOUND (EUS) LINEAR;  Surgeon: Carol Ada, MD;  Location: [facility] ENDOSCOPY;  Service: Endoscopy;  Laterality: N/A;   LAPAROSCOPIC RIGHT COLECTOMY Right 07/17/2013   Procedure: LAPAROSCOPIC ASSISTED  RIGHT COLECTOMY;  Surgeon: Odis Hollingshead, MD;  Location: [facility] ORS;  Service: General;  Laterality: Right;   TONSILLECTOMY     teenager- age 70    OB/GYN HISTORY:  OB History  No obstetric history on file.    No LMP recorded. Patient is postmenopausal.  Age at menarche: 71 Age at menopause: 30 Hx of HRT: Yes Hx of STDs: Denies Last pap: Patient reports in 2022 History of abnormal pap smears: Yes, reports remote history of abnormal Paps in 72s  Used IUD and OCPs for birth control in the 1960s  SCREENING STUDIES:  Last mammogram: 2022  Last colonoscopy: 2022  MEDICATIONS: Outpatient Encounter Medications as of 04/29/2021  Medication Sig   Calcium Carb-Cholecalciferol (CALCIUM 600+D3 PO) Take 1 tablet by mouth in the morning and at bedtime.   cetirizine (ZYRTEC) 10 MG tablet Take 10 mg by mouth every 30 (thirty) days. As needed   cholecalciferol (VITAMIN D3) 25 MCG (1000 UNIT) tablet Take 1,000 Units by mouth  in the morning.   diazepam (VALIUM) 5 MG tablet Take 5 mg by mouth daily as needed for anxiety.   [START ON 05/13/2021] enoxaparin (LOVENOX) 40 MG/0.4ML injection Inject 0.4 mLs (40 mg total) into the skin daily for 14 days.   escitalopram (LEXAPRO) 10 MG tablet Take 1.5 tablets (15 mg total) by mouth daily.   ibuprofen (ADVIL) 600 MG tablet Take 1 tablet (600 mg total) by mouth every 6 (six) hours as needed for moderate  pain. For AFTER surgery only   Lactobacillus (ACIDOPHILUS PO) Take 1 capsule by mouth in the morning.   levothyroxine (SYNTHROID, LEVOTHROID) 75 MCG tablet Take 75 mcg by mouth daily before breakfast.   oxyCODONE (OXY IR/ROXICODONE) 5 MG immediate release tablet Take 1 tablet (5 mg total) by mouth every 4 (four) hours as needed for severe pain. For AFTER surgery only, do not take and drive   oxyCODONE-acetaminophen (PERCOCET/ROXICET) 5-325 MG tablet Take 1 tablet by mouth every 6 (six) hours as needed for severe pain.   Polyethyl Glycol-Propyl Glycol (SYSTANE) 0.4-0.3 % SOLN Place 1-2 drops into both eyes 3 (three) times daily as needed (dry/irritated eyes.).   senna-docusate (SENOKOT-S) 8.6-50 MG tablet Take 2 tablets by mouth at bedtime. For AFTER surgery, do not take if having diarrhea   valACYclovir (VALTREX) 1000 MG tablet Take 1,000 mg by mouth daily as needed (Fever Blister).   zolpidem (AMBIEN) 10 MG tablet TAKE 1 TABLET BY MOUTH AT BEDTIME AS NEEDED FOR SLEEP   chlorhexidine (PERIDEX) 0.12 % solution SMARTSIG:1 Capful(s) By Mouth Twice Daily   ondansetron (ZOFRAN) 8 MG tablet Take 1 tablet (8 mg total) by mouth every 8 (eight) hours as needed for refractory nausea / vomiting. (Patient not taking: No sig reported)   prochlorperazine (COMPAZINE) 10 MG tablet Take 1 tablet (10 mg total) by mouth every 6 (six) hours as needed (Nausea or vomiting). (Patient not taking: No sig reported)   No facility-administered encounter medications on file as of 04/29/2021.    ALLERGIES:  Allergies  Allergen Reactions   Barium-Containing Compounds     Legs and arm cramping & caused fall   Hydrocodone Nausea And Vomiting    Can tolerate Oxycodone   Erythromycin     GI upset     FAMILY HISTORY:  Family History  Problem Relation Age of Onset   Cancer Brother        internal melanoma   Cancer Maternal Grandmother        colon   Ovarian cancer Neg Hx    Endometrial cancer Neg Hx    Pancreatic  cancer Neg Hx    Prostate cancer Neg Hx      SOCIAL HISTORY:  Social Connections: Not on file    REVIEW OF SYSTEMS:  Denies appetite changes, fevers, chills, unexplained weight changes. Denies hearing loss, neck lumps or masses, mouth sores, ringing in ears or voice changes. Denies cough or wheezing.  Denies shortness of breath. Denies chest pain or palpitations. Denies leg swelling. Denies abdominal distention, pain, blood in stools, constipation, diarrhea, nausea, vomiting, or early satiety. Denies pain with intercourse, dysuria, frequency, hematuria or incontinence. Denies hot flashes, pelvic pain, vaginal bleeding or vaginal discharge.   Denies joint pain, back pain or muscle pain/cramps. Denies itching, rash, or wounds. Denies dizziness, headaches, numbness or seizures. Denies swollen lymph nodes or glands, denies easy bruising or bleeding. Denies anxiety, depression, confusion, or decreased concentration.  Physical Exam:  Vital Signs for this encounter:  Blood pressure 140/89,  pulse 96, temperature 98 F (36.7 C), temperature source Oral, resp. rate 18, height _0  (1.651 m), weight 153 lb 9.6 oz (69.7 kg), SpO2 99 %. Body mass index is 25.56 kg/m. General: Alert, oriented, no acute distress.  HEENT: Normocephalic, atraumatic. Sclera anicteric.  Chest: Clear to auscultation bilaterally. No wheezes, rhonchi, or rales. Cardiovascular: Regular rate and rhythm, no murmurs, rubs, or gallops.  Abdomen: Normoactive bowel sounds. Soft, nondistended, nontender to palpation. No masses or hepatosplenomegaly appreciated. No palpable fluid wave.  Well-healed mini laparotomy incision inferior to the umbilicus. Extremities: Grossly normal range of motion. Warm, well perfused. No edema bilaterally.  Skin: No rashes or lesions.  Lymphatics: No cervical, supraclavicular, or inguinal adenopathy.  GU:  Normal external female genitalia. No lesions. No discharge or bleeding.              Bladder/urethra:  No lesions or masses, well supported bladder             Vagina: Mildly atrophic, no lesions or masses.             Cervix: Normal appearing, no lesions.             Uterus: Very Small, mobile, no parametrial involvement or nodularity.             Adnexa: No masses appreciated, no nodularity within the cul-de-sac.  Rectal: Confirms above findings.  LABORATORY AND RADIOLOGIC DATA:  Outside medical records were reviewed to synthesize the above history, along with the history and physical obtained during the visit.   Lab Results  Component Value Date   WBC 2.5 (L) 04/29/2021   HGB 11.1 (L) 04/29/2021   HCT 33.5 (L) 04/29/2021   PLT 213 04/29/2021   GLUCOSE 88 04/29/2021   ALT 21 04/29/2021   AST 22 04/29/2021   NA 140 04/29/2021   K 4.7 04/29/2021   CL 108 04/29/2021   CREATININE 0.73 04/29/2021   BUN 17 04/29/2021   CO2 25 04/29/2021   TSH 1.21 03/14/2016   INR 1.0 01/05/2021   CT C/A/P on 11/1: FINDINGS: CT CHEST FINDINGS   Cardiovascular: No acute findings.   Mediastinum/Lymph Nodes: Pre-vascular mediastinal lymph node measures 9 mm on image 18/2, decreased from 1.6 cm on prior exam. Previously seen anterior mediastinal lymph node along the right posterior wall of the sternum is no longer visualized. Mild right retrocrural lymphadenopathy has also resolved. No new or increased areas of lymphadenopathy identified.   Lungs/Pleura: No suspicious pulmonary nodules or masses identified. No evidence of infiltrate or pleural effusion.   Musculoskeletal:  No suspicious bone lesions identified.   CT ABDOMEN AND PELVIS FINDINGS   Hepatobiliary: No masses identified. Gallbladder is unremarkable. No evidence of biliary ductal dilatation.   Pancreas:  No mass or inflammatory changes.   Spleen:  Within normal limits in size and appearance.   Adrenals/Urinary tract:  No masses or hydronephrosis.   Stomach/Bowel: Stable postop changes from previous right  colectomy. No evidence of obstruction, inflammatory process, or abnormal fluid collections.   Vascular/Lymphatic: Stable sub-cm bilateral external iliac lymph nodes. No pathologically enlarged lymph nodes identified. No acute vascular findings. Aortic atherosclerotic calcification noted.   Reproductive: There has been resolution of pelvic ascites since previous study. Several soft tissue nodules previously seen within the pelvic mesentery are no longer visualized. Significant decrease in omental soft tissue caking also demonstrated.   Other:  None.   Musculoskeletal:  No suspicious bone lesions identified.   IMPRESSION: Significant decrease in peritoneal  carcinomatosis since previous study, with resolution of ascites.   Decreased anterior mediastinal and retrocrural lymphadenopathy.   No new or progressive metastatic disease within the chest, abdomen, or pelvis.   Aortic Atherosclerosis (ICD10-I70.0).

## 2021-04-29 NOTE — H&P (View-Only) (Signed)
GYNECOLOGIC ONCOLOGY NEW PATIENT CONSULTATION   Patient Name: Leslie Rivera  Patient Age: 75 y.o. 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  Reviewed significant improvement of metastatic disease and ascites on imaging. CA-125 has decreased substantially. Discussed role and goals of surgery. Given some persistent lymph nodes that are not able to be debulked due to location, I think it is reasonable to proceed with interval debulking surgery with plan for adjuvant chemotherapy.  The patient is firm today in her decision not to undergo further treatment with taxol, but she is willing to consider single agent carboplatin for adjuvant treatment.  We discussed the role of surgery in getting tissue to send for somatic testing. We discussed why tumor would be sent for mutation testing and HRD status. These results could show that she would be a candidate for maintenance PARPi treatment.   Her desire is to maximize quality of life. With low burden of disease on CT (planning to repeat CA-125 today which I expect to be near normal), I think that robotic IDS is very reasonable. Would plan for mini-lap for palpation of intra-abdominal surfaces.   We discussed possible adhesions in the setting of prior colon surgery (right colectomy) for colon cancer. Plan will be to enter through LUQ incision.   Will plan for 2 weeks of post-operative prophylactic lovenox; if surgery requires large incision, plan would be for 4 weeks.  We discussed plan for a robotic assisted hysterectomy, bilateral salpingo-oophorectomy, omentectomy, mini-lap, possible tumor debulking, possible laparotomy. The risks of surgery were discussed in detail and she understands these to  include infection; wound separation; hernia; vaginal cuff separation, injury to adjacent organs such as bowel, bladder, blood vessels, ureters and nerves; bleeding which may require blood transfusion; anesthesia risk; thromboembolic events; possible death; unforeseen complications; possible need for re-exploration; medical complications such as heart attack, stroke, pleural effusion and pneumonia. The patient will receive DVT and antibiotic prophylaxis as indicated. She voiced a clear understanding. She had the opportunity to ask questions. Perioperative instructions were reviewed with her. Prescriptions for post-op medications were sent to her pharmacy of choice.  A copy of this note was sent to the patient's referring provider.  82 minutes of total time was spent for this patient encounter, including preparation, face-to-face counseling with the patient and coordination of care, and documentation of the encounter.    Jeral Pinch, MD  Division of Gynecologic Oncology  Department of Obstetrics and Gynecology  [facility]  ___________________________________________  Chief Complaint: No chief complaint on file.   History of Present Illness:  Leslie Rivera is a 75 y.o. y.o. female who is seen in consultation at the request of Merrilee Seashore, MD for an evaluation of metastatic high-grade adenocarcinoma of GYN origin.  The patient initially presented with abdominal symptoms including bloating and changes to her bowel habit.  She underwent virtual colonoscopy which did not show colonic lesions but revealed carcinomatosis and ascites.  Patient then underwent CT-guided biopsy of omental nodularity.  Biopsy showed metastatic high-grade adenocarcinoma.  IHC nonspecific with differential diagnosis including upper GI, breast, and lung primary.  CEA and CA 19.9 were both normal, CEA-125 was markedly elevated at 6790.  PET scan was then performed showing extensive FDG  avid peritoneal disease including implants  on the surface of the liver and extensive omental caking.  Ascites was also noted.  FDG avid retroperitoneal, retrocrural, internal mammary, and mediastinal lymph nodes also consistent with metastatic adenopathy.  Small right pleural effusion seen.  Additional IHC stains were performed on the tumor which overall was consistent with a high-grade serous carcinoma of gynecologic or primary peritoneal origin.  Patient was then referred to see Dr. Alvy Bimler.  In the setting of stage IV disease, neoadjuvant chemotherapy was recommended.  Patient has now undergone 3 cycles of carboplatin and paclitaxel.  She has had significant improvement in her symptoms from presentation although has had some difficulty with chemotherapy, notably side effects related to Taxol.  Her Ca1 25 prior to and during treatment are noted below.  CA-125 7/27: 6,790 8/19: 8,668 9/15: 2,002 10/20: 278  Since starting treatment, the patient reports feeling "fuzzy", fatigued, and too weak and tired to exercise.  She has occasional dizziness.  Notes appetite has been okay, denies any nausea or emesis.  Reports regular bowel and bladder function.  Treatment History: Oncology History Overview Note  Normal MMR   Primary peritoneal carcinomatosis ([place])  01/05/2021 Pathology Results   SURGICAL PATHOLOGY  CASE: [id number]  PATIENT: [facility]  Surgical Pathology Report   Reason for Addendum #1:  Immunohistochemistry results  Reason for Addendum #2:  DNA Mismatch Repair IHC Results   Clinical History: history of colon cancer, now with omental thickening  (cm)   FINAL MICROSCOPIC DIAGNOSIS:   A. OMENTUM, NEEDLE CORE BIOPSY:  - Metastatic poorly differentiated adenocarcinoma.  See comment   COMMENT:   Immunohistochemical stains show that the tumor cells are positive for CK7 and negative for CDX2 and CK20.  This immunoprofile is nonspecific and differential diagnosis can include an  upper gastrointestinal, breast and lung primary among other possibilities.  Only a very small fraction of primary colonic adenocarcinoma was immunoprofile.  Clinical and radiologic correlation suggested.   Additional immunohistochemical stains are performed and show that the tumor cells are positive for WT1, PAX8 and ER.  Immunostain for p53 shows a clonal overexpression pattern.  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  Clinical and radiologic correlation is suggested.    01/19/2021 Tumor Marker   Patient's tumor was tested for the following markers: CA-125. Results of the tumor marker test revealed 6790.   01/28/2021 PET scan   1. Examination is positive for extensive FDG avid peritoneal disease within the abdomen and pelvis including multiple implants upon the surface of liver and extensive omental caking. Moderate ascites noted within the pelvis. 2. FDG avid retroperitoneal, retrocrural, right internal mammary, and mediastinal lymph nodes compatible with metastatic adenopathy. 3. Small right pleural effusion. 4.  Aortic Atherosclerosis (ICD10-I70.0).     01/31/2021 Initial Diagnosis   Primary peritoneal carcinomatosis ([place])   01/31/2021 Cancer Staging   Staging form: Ovary, Fallopian Tube, and Primary Peritoneal Carcinoma, AJCC 8th Edition - Clinical stage from 01/31/2021: FIGO Stage IV (cT2b, cN1b, cM1) - Signed by Heath Lark, MD on 01/31/2021 Stage prefix: Initial diagnosis    02/14/2021 Tumor Marker   Patient's tumor was tested for the following markers: CA-125. Results of the tumor marker test revealed 8668.   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  Remainder of testing was negative/normal. The report date is  02/09/2021.  The CancerNext-Expanded + RNAinsight gene panel offered by [employer] and includes sequencing and rearrangement analysis for the following 77 genes: IP, ALK, APC*, ATM*, AXIN2, BAP1, BARD1, BLM, BMPR1A, BRCA1*, BRCA2*, BRIP1*, CDC73, CDH1*,CDK4, CDKN1B, CDKN2A, CHEK2*, CTNNA1, DICER1, FANCC, FH, FLCN, GALNT12, KIF1B, LZTR1, MAX, MEN1, MET, MLH1*, MSH2*, MSH3, MSH6*, MUTYH*, NBN, NF1*, NF2, NTHL1, PALB2*, PHOX2B, PMS2*, POT1, PRKAR1A, PTCH1, PTEN*, RAD51C*, RAD51D*,RB1, RECQL, RET, SDHA, SDHAF2, SDHB, SDHC, SDHD, SMAD4, SMARCA4, SMARCB1, SMARCE1, STK11, SUFU, TMEM127, TP53*,TSC1, TSC2, VHL and XRCC2 (sequencing and deletion/duplication); EGFR, EGLN1, HOXB13, KIT, MITF, PDGFRA, POLD1 and POLE (sequencing only); EPCAM and GREM1 (deletion/duplication only).   03/10/2021 Tumor Marker   Patient's tumor was tested for the following markers: CA-125. Results of the tumor marker test revealed 2002.   04/14/2021 Tumor Marker   Patient's tumor was tested for the following markers: CA-125. Results of the tumor marker test revealed 278.   04/26/2021 Imaging   Significant decrease in peritoneal carcinomatosis since previous study, with resolution of ascites.   Decreased anterior mediastinal and retrocrural lymphadenopathy.   No new or progressive metastatic disease within the chest, abdomen, or pelvis.   Aortic Atherosclerosis (ICD10-I70.0).     PAST MEDICAL HISTORY:  Past Medical History:  Diagnosis Date   Abdominal pain    Adenomatous colon polyp    Anemia    Iron deficiency anemia   Anxiety    Cancer ([place])    colon   Colon cancer ([place]) 07/17/2013   Family history of breast cancer    Family history of colon cancer    Family history of melanoma    History of migraine headaches    Hypothyroidism    Internal hemorrhoids    Irregular heart beat    occ. PVC's   Periumbilical pain    Stomach cancer ([place])      PAST SURGICAL HISTORY:  Past Surgical History:  Procedure  Laterality Date   BREAST BIOPSY Right    BREAST SURGERY     removal abnormal cells in right   ESOPHAGOGASTRODUODENOSCOPY (EGD) WITH PROPOFOL N/A 12/21/2020   Procedure: ESOPHAGOGASTRODUODENOSCOPY (EGD) WITH PROPOFOL;  Surgeon: Carol Ada, MD;  Location: [facility] END[facility]COPY;  Service: Endoscopy;  Laterality: N/A;   EUS N/A 12/21/2020   Procedure: UPPER ENDOSCOPIC ULTRASOUND (EUS) LINEAR;  Surgeon: Carol Ada, MD;  Location: [facility] ENDOSCOPY;  Service: Endoscopy;  Laterality: N/A;   LAPAROSCOPIC RIGHT COLECTOMY Right 07/17/2013   Procedure: LAPAROSCOPIC ASSISTED  RIGHT COLECTOMY;  Surgeon: Odis Hollingshead, MD;  Location: [facility] ORS;  Service: General;  Laterality: Right;   TONSILLECTOMY     teenager- age 48    OB/GYN HISTORY:  OB History  No obstetric history on file.    No LMP recorded. Patient is postmenopausal.  Age at menarche: 70 Age at menopause: 62 Hx of HRT: Yes Hx of STDs: Denies Last pap: Patient reports in 2022 History of abnormal pap smears: Yes, reports remote history of abnormal Paps in 82s  Used IUD and OCPs for birth control in the 1960s  SCREENING STUDIES:  Last mammogram: 2022  Last colonoscopy: 2022  MEDICATIONS: Outpatient Encounter Medications as of 04/29/2021  Medication Sig   Calcium Carb-Cholecalciferol (CALCIUM 600+D3 PO) Take 1 tablet by mouth in the morning and at bedtime.   cetirizine (ZYRTEC) 10 MG tablet Take 10 mg by mouth every 30 (thirty) days. As needed   cholecalciferol (VITAMIN D3) 25 MCG (1000 UNIT) tablet Take 1,000 Units by mouth  in the morning.   diazepam (VALIUM) 5 MG tablet Take 5 mg by mouth daily as needed for anxiety.   [START ON 05/13/2021] enoxaparin (LOVENOX) 40 MG/0.4ML injection Inject 0.4 mLs (40 mg total) into the skin daily for 14 days.   escitalopram (LEXAPRO) 10 MG tablet Take 1.5 tablets (15 mg total) by mouth daily.   ibuprofen (ADVIL) 600 MG tablet Take 1 tablet (600 mg total) by mouth every 6 (six) hours as needed for moderate  pain. For AFTER surgery only   Lactobacillus (ACIDOPHILUS PO) Take 1 capsule by mouth in the morning.   levothyroxine (SYNTHROID, LEVOTHROID) 75 MCG tablet Take 75 mcg by mouth daily before breakfast.   oxyCODONE (OXY IR/ROXICODONE) 5 MG immediate release tablet Take 1 tablet (5 mg total) by mouth every 4 (four) hours as needed for severe pain. For AFTER surgery only, do not take and drive   oxyCODONE-acetaminophen (PERCOCET/ROXICET) 5-325 MG tablet Take 1 tablet by mouth every 6 (six) hours as needed for severe pain.   Polyethyl Glycol-Propyl Glycol (SYSTANE) 0.4-0.3 % SOLN Place 1-2 drops into both eyes 3 (three) times daily as needed (dry/irritated eyes.).   senna-docusate (SENOKOT-S) 8.6-50 MG tablet Take 2 tablets by mouth at bedtime. For AFTER surgery, do not take if having diarrhea   valACYclovir (VALTREX) 1000 MG tablet Take 1,000 mg by mouth daily as needed (Fever Blister).   zolpidem (AMBIEN) 10 MG tablet TAKE 1 TABLET BY MOUTH AT BEDTIME AS NEEDED FOR SLEEP   chlorhexidine (PERIDEX) 0.12 % solution SMARTSIG:1 Capful(s) By Mouth Twice Daily   ondansetron (ZOFRAN) 8 MG tablet Take 1 tablet (8 mg total) by mouth every 8 (eight) hours as needed for refractory nausea / vomiting. (Patient not taking: No sig reported)   prochlorperazine (COMPAZINE) 10 MG tablet Take 1 tablet (10 mg total) by mouth every 6 (six) hours as needed (Nausea or vomiting). (Patient not taking: No sig reported)   No facility-administered encounter medications on file as of 04/29/2021.    ALLERGIES:  Allergies  Allergen Reactions   Barium-Containing Compounds     Legs and arm cramping & caused fall   Hydrocodone Nausea And Vomiting    Can tolerate Oxycodone   Erythromycin     GI upset     FAMILY HISTORY:  Family History  Problem Relation Age of Onset   Cancer Brother        internal melanoma   Cancer Maternal Grandmother        colon   Ovarian cancer Neg Hx    Endometrial cancer Neg Hx    Pancreatic  cancer Neg Hx    Prostate cancer Neg Hx      SOCIAL HISTORY:  Social Connections: Not on file    REVIEW OF SYSTEMS:  Denies appetite changes, fevers, chills, unexplained weight changes. Denies hearing loss, neck lumps or masses, mouth sores, ringing in ears or voice changes. Denies cough or wheezing.  Denies shortness of breath. Denies chest pain or palpitations. Denies leg swelling. Denies abdominal distention, pain, blood in stools, constipation, diarrhea, nausea, vomiting, or early satiety. Denies pain with intercourse, dysuria, frequency, hematuria or incontinence. Denies hot flashes, pelvic pain, vaginal bleeding or vaginal discharge.   Denies joint pain, back pain or muscle pain/cramps. Denies itching, rash, or wounds. Denies dizziness, headaches, numbness or seizures. Denies swollen lymph nodes or glands, denies easy bruising or bleeding. Denies anxiety, depression, confusion, or decreased concentration.  Physical Exam:  Vital Signs for this encounter:  Blood pressure 140/89,  pulse 96, temperature 98 F (36.7 C), temperature source Oral, resp. rate 18, height _0  (1.651 m), weight 153 lb 9.6 oz (69.7 kg), SpO2 99 %. Body mass index is 25.56 kg/m. General: Alert, oriented, no acute distress.  HEENT: Normocephalic, atraumatic. Sclera anicteric.  Chest: Clear to auscultation bilaterally. No wheezes, rhonchi, or rales. Cardiovascular: Regular rate and rhythm, no murmurs, rubs, or gallops.  Abdomen: Normoactive bowel sounds. Soft, nondistended, nontender to palpation. No masses or hepatosplenomegaly appreciated. No palpable fluid wave.  Well-healed mini laparotomy incision inferior to the umbilicus. Extremities: Grossly normal range of motion. Warm, well perfused. No edema bilaterally.  Skin: No rashes or lesions.  Lymphatics: No cervical, supraclavicular, or inguinal adenopathy.  GU:  Normal external female genitalia. No lesions. No discharge or bleeding.              Bladder/urethra:  No lesions or masses, well supported bladder             Vagina: Mildly atrophic, no lesions or masses.             Cervix: Normal appearing, no lesions.             Uterus: Very Small, mobile, no parametrial involvement or nodularity.             Adnexa: No masses appreciated, no nodularity within the cul-de-sac.  Rectal: Confirms above findings.  LABORATORY AND RADIOLOGIC DATA:  Outside medical records were reviewed to synthesize the above history, along with the history and physical obtained during the visit.   Lab Results  Component Value Date   WBC 2.5 (L) 04/29/2021   HGB 11.1 (L) 04/29/2021   HCT 33.5 (L) 04/29/2021   PLT 213 04/29/2021   GLUCOSE 88 04/29/2021   ALT 21 04/29/2021   AST 22 04/29/2021   NA 140 04/29/2021   K 4.7 04/29/2021   CL 108 04/29/2021   CREATININE 0.73 04/29/2021   BUN 17 04/29/2021   CO2 25 04/29/2021   TSH 1.21 03/14/2016   INR 1.0 01/05/2021   CT C/A/P on 11/1: FINDINGS: CT CHEST FINDINGS   Cardiovascular: No acute findings.   Mediastinum/Lymph Nodes: Pre-vascular mediastinal lymph node measures 9 mm on image 18/2, decreased from 1.6 cm on prior exam. Previously seen anterior mediastinal lymph node along the right posterior wall of the sternum is no longer visualized. Mild right retrocrural lymphadenopathy has also resolved. No new or increased areas of lymphadenopathy identified.   Lungs/Pleura: No suspicious pulmonary nodules or masses identified. No evidence of infiltrate or pleural effusion.   Musculoskeletal:  No suspicious bone lesions identified.   CT ABDOMEN AND PELVIS FINDINGS   Hepatobiliary: No masses identified. Gallbladder is unremarkable. No evidence of biliary ductal dilatation.   Pancreas:  No mass or inflammatory changes.   Spleen:  Within normal limits in size and appearance.   Adrenals/Urinary tract:  No masses or hydronephrosis.   Stomach/Bowel: Stable postop changes from previous right  colectomy. No evidence of obstruction, inflammatory process, or abnormal fluid collections.   Vascular/Lymphatic: Stable sub-cm bilateral external iliac lymph nodes. No pathologically enlarged lymph nodes identified. No acute vascular findings. Aortic atherosclerotic calcification noted.   Reproductive: There has been resolution of pelvic ascites since previous study. Several soft tissue nodules previously seen within the pelvic mesentery are no longer visualized. Significant decrease in omental soft tissue caking also demonstrated.   Other:  None.   Musculoskeletal:  No suspicious bone lesions identified.   IMPRESSION: Significant decrease in peritoneal  carcinomatosis since previous study, with resolution of ascites.   Decreased anterior mediastinal and retrocrural lymphadenopathy.   No new or progressive metastatic disease within the chest, abdomen, or pelvis.   Aortic Atherosclerosis (ICD10-I70.0).

## 2021-04-29 NOTE — Patient Instructions (Addendum)
Dr. Berline Lopes would like to check a CA 125 today and we will contact you with the results.   Preparing for your Surgery  Plan for surgery on May 12, 2021 with Dr. Jeral Pinch at [place] will be scheduled for robotic assisted total laparoscopic hysterectomy (removal of the uterus and cervix), bilateral salpingo-oophorectomy (removal of both ovaries and fallopian tubes), omentectomy (removal of the omentum), debulking, possible laparotomy (larger incision on the abdomen if needed).  Pre-operative Testing -You will receive a phone call from presurgical testing at [facility] to arrange for a pre-operative appointment and lab work.  -Bring your insurance card, copy of an advanced directive if applicable, medication list  -At that visit, you will be asked to sign a consent for a possible blood transfusion in case a transfusion becomes necessary during surgery.  The need for a blood transfusion is rare but having consent is a necessary part of your care.     -You should not be taking blood thinners or aspirin at least ten days prior to surgery unless instructed by your surgeon.  -Do not take supplements such as fish oil (omega 3), red yeast rice, turmeric before your surgery. You want to avoid medications with aspirin in them including headache powders such as BC or Goody's), Excedrin migraine.  Day Before Surgery at [place] will be asked to take in a light diet the day before surgery. You will be advised you can have clear liquids up until 3 hours before your surgery.    Eat a light diet the day before surgery.  Examples including soups, broths, toast, yogurt, mashed potatoes.  AVOID GAS PRODUCING FOODS. Things to avoid include carbonated beverages (fizzy beverages, sodas), raw fruits and raw vegetables (uncooked), or beans.   If your bowels are filled with gas, your surgeon will have difficulty visualizing your pelvic organs which increases your surgical  risks.  Your role in recovery Your role is to become active as soon as directed by your doctor, while still giving yourself time to heal.  Rest when you feel tired. You will be asked to do the following in order to speed your recovery:  - Cough and breathe deeply. This helps to clear and expand your lungs and can prevent pneumonia after surgery.  - [place]. Do mild physical activity. Walking or moving your legs help your circulation and body functions return to normal. Do not try to get up or walk alone the first time after surgery.   -If you develop swelling on one leg or the other, pain in the back of your leg, redness/warmth in one of your legs, please call the office or go to the Emergency Room to have a doppler to rule out a blood clot. For shortness of breath, chest pain-seek care in the Emergency Room as soon as possible. - Actively manage your pain. Managing your pain lets you move in comfort. We will ask you to rate your pain on a scale of zero to 10. It is your responsibility to tell your doctor or nurse where and how much you hurt so your pain can be treated.  Special Considerations -If you are diabetic, you may be placed on insulin after surgery to have closer control over your blood sugars to promote healing and recovery.  This does not mean that you will be discharged on insulin.  If applicable, your oral antidiabetics will be resumed when you are tolerating a solid diet.  -Your  final pathology results from surgery should be available around one week after surgery and the results will be relayed to you when available.  -Dr. Lahoma Crocker is the surgeon that assists your GYN Oncologist with surgery.  If you end up staying the night, the next day after your surgery you will either see Dr. Denman George, Dr. Berline Lopes, or Dr. Lahoma Crocker.  -FMLA forms can be faxed to [phone] and please allow 5-7 business days for completion.  Pain Management After  Surgery -You have been prescribed your pain medication and bowel regimen medications before surgery so that you can have these available when you are discharged from the hospital. The pain medication is for use ONLY AFTER surgery and a new prescription will not be given.   -Make sure that you have Tylenol and Ibuprofen at home to use on a regular basis after surgery for pain control. We recommend alternating the medications every hour to six hours since they work differently and are processed in the body differently for pain relief.  -I would recommend using the ibuprofen sparingly since it can increase your chance of gastrointestinal bleeding while taking lexapro.  -Review the attached handout on narcotic use and their risks and side effects.   Bowel Regimen -You have been prescribed Sennakot-S to take nightly to prevent constipation especially if you are taking the narcotic pain medication intermittently.  It is important to prevent constipation and drink adequate amounts of liquids. You can stop taking this medication when you are not taking pain medication and you are back on your normal bowel routine.  Risks of Surgery Risks of surgery are low but include bleeding, infection, damage to surrounding structures, re-operation, blood clots, and very rarely death.   Blood Transfusion Information (For the consent to be signed before surgery)  We will be checking your blood type before surgery so in case of emergencies, we will know what type of blood you would need.                                            WHAT IS A BLOOD TRANSFUSION?  A transfusion is the replacement of blood or some of its parts. Blood is made up of multiple cells which provide different functions. Red blood cells carry oxygen and are used for blood loss replacement. White blood cells fight against infection. Platelets control bleeding. Plasma helps clot blood. Other blood products are available for specialized needs,  such as hemophilia or other clotting disorders. BEFORE THE TRANSFUSION  Who gives blood for transfusions?  You may be able to donate blood to be used at a later date on yourself (autologous donation). Relatives can be asked to donate blood. This is generally not any safer than if you have received blood from a stranger. The same precautions are taken to ensure safety when a relative's blood is donated. Healthy volunteers who are fully evaluated to make sure their blood is safe. This is blood bank blood. Transfusion therapy is the safest it has ever been in the practice of medicine. Before blood is taken from a donor, a complete history is taken to make sure that person has no history of diseases nor engages in risky social behavior (examples are intravenous drug use or sexual activity with multiple partners). The donor's travel history is screened to minimize risk of transmitting infections, such as malaria. The donated blood is tested  for signs of infectious diseases, such as HIV and hepatitis. The blood is then tested to be sure it is compatible with you in order to minimize the chance of a transfusion reaction. If you or a relative donates blood, this is often done in anticipation of surgery and is not appropriate for emergency situations. It takes many days to process the donated blood. RISKS AND COMPLICATIONS Although transfusion therapy is very safe and saves many lives, the main dangers of transfusion include:  Getting an infectious disease. Developing a transfusion reaction. This is an allergic reaction to something in the blood you were given. Every precaution is taken to prevent this. The decision to have a blood transfusion has been considered carefully by your caregiver before blood is given. Blood is not given unless the benefits outweigh the risks.  AFTER SURGERY INSTRUCTIONS  Return to work: 4-6 weeks if applicable  You will need to perform once daily lovenox injections starting the  day after surgery to prevent blood clots. If the surgery is performed through small incisions, you will need to take these injections for 2 weeks. If larger incision is made, it will be for 4 weeks.  Activity: 1. Be up and out of the bed during the day.  Take a nap if needed.  You may walk up steps but be careful and use the hand rail.  Stair climbing will tire you more than you think, you may need to stop part way and rest.   2. No lifting or straining for 6 weeks over 10 pounds. No pushing, pulling, straining for 6 weeks.  3. No driving for 1 week(s).  Do not drive if you are taking narcotic pain medicine and make sure that your reaction time has returned.   4. You can shower as soon as the next day after surgery. Shower daily.  Use your regular soap and water (not directly on the incision) and pat your incision(s) dry afterwards; don't rub.  No tub baths or submerging your body in water until cleared by your surgeon. If you have the soap that was given to you by pre-surgical testing that was used before surgery, you do not need to use it afterwards because this can irritate your incisions.   5. No sexual activity and nothing in the vagina for 8 weeks.  6. You may experience a small amount of clear drainage from your incisions, which is normal.  If the drainage persists, increases, or changes color please call the office.  7. Do not use creams, lotions, or ointments such as neosporin on your incisions after surgery until advised by your surgeon because they can cause removal of the dermabond glue on your incisions.    8. You may experience vaginal spotting after surgery or around the 6-8 week mark from surgery when the stitches at the top of the vagina begin to dissolve.  The spotting is normal but if you experience heavy bleeding, call our office.  9. Take Tylenol or ibuprofen first for pain and only use narcotic pain medication for severe pain not relieved by the Tylenol or Ibuprofen.  Monitor  your Tylenol intake to a max of 4,000 mg in a 24 hour period. You can alternate these medications after surgery.  Diet: 1. Low sodium Heart Healthy Diet is recommended but you are cleared to resume your normal (before surgery) diet after your procedure.  2. It is safe to use a laxative, such as Miralax or Colace, if you have difficulty moving your bowels.  You have been prescribed Sennakot at bedtime every evening to keep bowel movements regular and to prevent constipation.    Wound Care: 1. Keep clean and dry.  Shower daily.  Reasons to call the Doctor: Fever - Oral temperature greater than 100.4 degrees Fahrenheit Foul-smelling vaginal discharge Difficulty urinating Nausea and vomiting Increased pain at the site of the incision that is unrelieved with pain medicine. Difficulty breathing with or without chest pain New calf pain especially if only on one side Sudden, continuing increased vaginal bleeding with or without clots.   Contacts: For questions or concerns you should contact:  Dr. Jeral Pinch at [phone]  Joylene John, NP at [phone]  After Hours: call [phone] and have the GYN Oncologist paged/contacted (after 5 pm or on the weekends).  Messages sent via mychart are for non-urgent matters and are not responded to after hours so for urgent needs, please call the after hours number.

## 2021-04-30 LAB — CA 125: Cancer Antigen (CA) 125: 134 U/mL — ABNORMAL HIGH (ref 0.0–38.1)

## 2021-05-02 ENCOUNTER — Inpatient Hospital Stay: Payer: Medicare Other | Admitting: Gynecologic Oncology

## 2021-05-02 ENCOUNTER — Encounter: Payer: Self-pay | Admitting: Hematology and Oncology

## 2021-05-02 DIAGNOSIS — C579 Malignant neoplasm of female genital organ, unspecified: Secondary | ICD-10-CM | POA: Insufficient documentation

## 2021-05-02 NOTE — Patient Instructions (Addendum)
DUE TO COVID-19 ONLY ONE VISITOR IS ALLOWED TO COME WITH YOU AND STAY IN THE WAITING ROOM ONLY DURING PRE OP AND PROCEDURE.   **NO VISITORS ARE ALLOWED IN THE SHORT STAY AREA OR RECOVERY ROOM!!**       Your procedure is scheduled on: 05/12/21   Report to [facility] Main Entrance    Report to admitting at 12:30 PM   Call this number if you have problems the morning of surgery [phone]   Do not eat food :After Midnight.   May have liquids until 11:45 AM day of surgery  CLEAR LIQUID DIET  Foods Allowed                                                                     Foods Excluded  Water, Black Coffee and tea (no milk or creamer)           liquids that you cannot  Plain Jell-O in any flavor  (No red)                                    see through such as: Fruit ices (not with fruit pulp)                                            milk, soups, orange juice              Iced Popsicles (No red)                                                All solid food                                   Apple juices Sports drinks like Gatorade (No red) Lightly seasoned clear broth or consume(fat free) Sugar    Drink 2 Ensure drinks the night before surgery, have done by 10pm.  Complete one Ensure drink the morning of surgery 3 hours prior to scheduled surgery at 11:45 AM.     The day of surgery:  Drink ONE (1) Pre-Surgery Clear Ensure by 11:45 am the morning of surgery. Drink in one sitting. Do not sip.  This drink was given to you during your hospital  pre-op appointment visit. Nothing else to drink after completing the  Pre-Surgery Clear Ensure or G2.          If you have questions, please contact your surgeon's office.     Oral Hygiene is also important to reduce your risk of infection.                                    Remember - BRUSH YOUR TEETH THE MORNING OF SURGERY WITH YOUR REGULAR TOOTHPASTE   Take these medicines the morning of surgery with  A SIP OF WATER: Zyrtec,  Valium, Lexapro, Synthroid             You may not have any metal on your body including hair pins, jewelry, and body piercing             Do not wear make-up, lotions, powders, perfumes, or deodorant  Do not wear nail polish including gel and S&S, artificial/acrylic nails, or any other type of covering on natural nails including finger and toenails. If you have artificial nails, gel coating, etc. that needs to be removed by a nail salon please have this removed prior to surgery or surgery may need to be canceled/ delayed if the surgeon/ anesthesia feels like they are unable to be safely monitored.   Do not shave  48 hours prior to surgery.    Do not bring valuables to the hospital. [place].    Patients discharged on the day of surgery will not be allowed to drive home.  Please read over the following fact sheets you were given: IF YOU HAVE QUESTIONS ABOUT YOUR PRE-OP INSTRUCTIONS PLEASE CALL [place] - Preparing for Surgery Before surgery, you can play an important role.  Because skin is not sterile, your skin needs to be as free of germs as possible.  You can reduce the number of germs on your skin by washing with CHG (chlorahexidine gluconate) soap before surgery.  CHG is an antiseptic cleaner which kills germs and bonds with the skin to continue killing germs even after washing. Please DO NOT use if you have an allergy to CHG or antibacterial soaps.  If your skin becomes reddened/irritated stop using the CHG and inform your nurse when you arrive at Short Stay. Do not shave (including legs and underarms) for at least 48 hours prior to the first CHG shower.  You may shave your face/neck.  Please follow these instructions carefully:  1.  Shower with CHG Soap the night before surgery and the  morning of surgery.  2.  If you choose to wash your hair, wash your hair first as usual with your normal  shampoo.  3.  After  you shampoo, rinse your hair and body thoroughly to remove the shampoo.                             4.  Use CHG as you would any other liquid soap.  You can apply chg directly to the skin and wash.  Gently with a scrungie or clean washcloth.  5.  Apply the CHG Soap to your body ONLY FROM THE NECK DOWN.   Do   not use on face/ open                           Wound or open sores. Avoid contact with eyes, ears mouth and   genitals (private parts).                       Wash face,  Genitals (private parts) with your normal soap.             6.  Wash thoroughly, paying special attention to the area where your    surgery  will be performed.  7.  Thoroughly rinse your body with  warm water from the neck down.  8.  DO NOT shower/wash with your normal soap after using and rinsing off the CHG Soap.                9.  Pat yourself dry with a clean towel.            10.  Wear clean pajamas.            11.  Place clean sheets on your bed the night of your first shower and do not  sleep with pets. Day of Surgery : Do not apply any lotions/deodorants the morning of surgery.  Please wear clean clothes to the hospital/surgery center.  FAILURE TO FOLLOW THESE INSTRUCTIONS MAY RESULT IN THE CANCELLATION OF YOUR SURGERY  PATIENT SIGNATURE_________________________________  NURSE SIGNATURE__________________________________  ________________________________________________________________________  WHAT IS A BLOOD TRANSFUSION? Blood Transfusion Information  A transfusion is the replacement of blood or some of its parts. Blood is made up of multiple cells which provide different functions. Red blood cells carry oxygen and are used for blood loss replacement. White blood cells fight against infection. Platelets control bleeding. Plasma helps clot blood. Other blood products are available for specialized needs, such as hemophilia or other clotting disorders. BEFORE THE TRANSFUSION  Who gives blood for transfusions?   Healthy volunteers who are fully evaluated to make sure their blood is safe. This is blood bank blood. Transfusion therapy is the safest it has ever been in the practice of medicine. Before blood is taken from a donor, a complete history is taken to make sure that person has no history of diseases nor engages in risky social behavior (examples are intravenous drug use or sexual activity with multiple partners). The donor's travel history is screened to minimize risk of transmitting infections, such as malaria. The donated blood is tested for signs of infectious diseases, such as HIV and hepatitis. The blood is then tested to be sure it is compatible with you in order to minimize the chance of a transfusion reaction. If you or a relative donates blood, this is often done in anticipation of surgery and is not appropriate for emergency situations. It takes many days to process the donated blood. RISKS AND COMPLICATIONS Although transfusion therapy is very safe and saves many lives, the main dangers of transfusion include:  Getting an infectious disease. Developing a transfusion reaction. This is an allergic reaction to something in the blood you were given. Every precaution is taken to prevent this. The decision to have a blood transfusion has been considered carefully by your caregiver before blood is given. Blood is not given unless the benefits outweigh the risks. AFTER THE TRANSFUSION Right after receiving a blood transfusion, you will usually feel much better and more energetic. This is especially true if your red blood cells have gotten low (anemic). The transfusion raises the level of the red blood cells which carry oxygen, and this usually causes an energy increase. The nurse administering the transfusion will monitor you carefully for complications. HOME CARE INSTRUCTIONS  No special instructions are needed after a transfusion. You may find your energy is better. Speak with your caregiver about  any limitations on activity for underlying diseases you may have. SEEK MEDICAL CARE IF:  Your condition is not improving after your transfusion. You develop redness or irritation at the intravenous (IV) site. SEEK IMMEDIATE MEDICAL CARE IF:  Any of the following symptoms occur over the next 12 hours: Shaking chills. You have a temperature by mouth  above 102 F (38.9 C), not controlled by medicine. Chest, back, or muscle pain. People around you feel you are not acting correctly or are confused. Shortness of breath or difficulty breathing. Dizziness and fainting. You get a rash or develop hives. You have a decrease in urine output. Your urine turns a dark color or changes to pink, red, or brown. Any of the following symptoms occur over the next 10 days: You have a temperature by mouth above 102 F (38.9 C), not controlled by medicine. Shortness of breath. Weakness after normal activity. The white part of the eye turns yellow (jaundice). You have a decrease in the amount of urine or are urinating less often. Your urine turns a dark color or changes to pink, red, or brown. Document Released: 06/09/2000 Document Revised: 09/04/2011 Document Reviewed: 01/27/2008 [facility] Patient Information 2014 [place], [place].  _______________________________________________________________________

## 2021-05-02 NOTE — Progress Notes (Signed)
Patient here with her sister for consultation with Dr. Jeral Pinch and for a pre-operative discussion prior to her scheduled surgery on May 12, 2021. She is scheduled for robotic assisted total laparoscopic hysterectomy, bilateral salpingo-oophorectomy, omentectomy, debulking, possible laparotomy. She has her pre-admission testing appointment on May 03, 2021 at [facility].  The surgery was discussed in detail.  See after visit summary for additional details. Visual aids used to discuss items related to surgery including sequential compression stockings, foley catheter, IV pump, multi-modal pain regimen including tylenol, photo of the surgical robot, female reproductive system to discuss surgery in detail.      Discussed post-op pain management in detail including the aspects of the enhanced recovery pathway.  Advised her that a new prescription would be sent in for oxycodone and it is only to be used for after her upcoming surgery.  We discussed the use of tylenol post-op and to monitor for a maximum of 4,000 mg in a 24 hour period.  Also prescribed sennakot to be used after surgery and to hold if having loose stools.  Discussed bowel regimen in detail.     Discussed the use of heparin pre-op, SCDs, and measures to take at home to prevent DVT including frequent mobility.  She was instructed on lovenox use for 2 weeks post-operatively for DVT prop and handouts given. She is aware the lovenox prescription should not be started until after surgery. Reportable signs and symptoms of DVT discussed. Post-operative instructions discussed and expectations for after surgery. Incisional care discussed as well including reportable signs and symptoms including erythema, drainage, wound separation.     10 minutes spent with the patient.  Verbalizing understanding of material discussed. No needs or concerns voiced at the end of the visit.   Advised patient and family to call for any needs.  Advised that her  post-operative medications had been prescribed and could be picked up at any time.     This appointment is included in the global surgical bundle as pre-operative teaching and has no charge.

## 2021-05-02 NOTE — Progress Notes (Addendum)
COVID swab appointment: n/a  COVID Vaccine Completed: yes x2 Date COVID Vaccine completed: 07/16/19, 08/05/19 Has received booster: COVID vaccine manufacturer: Pfizer       Date of COVID positive in last 90 days: no  PCP - Merrilee Seashore, MD Cardiologist - n/a  Chest x-ray - CT 04/26/21 Epic EKG - n/a Stress Test - yes in 90s per pt ECHO - n/a Cardiac Cath - n/a Pacemaker/ICD device last checked: n/a Spinal Cord Stimulator: n/a  Sleep Study - n/a CPAP -   Fasting Blood Sugar - n/a Checks Blood Sugar _____ times a day  Blood Thinner Instructions: n/a Aspirin Instructions: Last Dose:  Activity level: Can go up a flight of stairs and perform activities of daily living without stopping and without symptoms of chest pain. Endorses SOB    Anesthesia review: PVCs, anemia, chemo 3 weeks ago, UA positive for Hgb and bacteria results sent to Dr. Berline Lopes  Patient denies shortness of breath, fever, cough and chest pain at PAT appointment   Patient verbalized understanding of instructions that were given to them at the PAT appointment. Patient was also instructed that they will need to review over the PAT instructions again at home before surgery.

## 2021-05-02 NOTE — Patient Instructions (Signed)
Dr. Berline Lopes would like to check a CA 125 today and we will contact you with the results.    Preparing for your Surgery   Plan for surgery on May 12, 2021 with Dr. Jeral Pinch at [place] will be scheduled for robotic assisted total laparoscopic hysterectomy (removal of the uterus and cervix), bilateral salpingo-oophorectomy (removal of both ovaries and fallopian tubes), omentectomy (removal of the omentum), debulking, possible laparotomy (larger incision on the abdomen if needed).   Pre-operative Testing -You will receive a phone call from presurgical testing at [facility] to arrange for a pre-operative appointment and lab work.   -Bring your insurance card, copy of an advanced directive if applicable, medication list   -At that visit, you will be asked to sign a consent for a possible blood transfusion in case a transfusion becomes necessary during surgery.  The need for a blood transfusion is rare but having consent is a necessary part of your care.      -You should not be taking blood thinners or aspirin at least ten days prior to surgery unless instructed by your surgeon.   -Do not take supplements such as fish oil (omega 3), red yeast rice, turmeric before your surgery. You want to avoid medications with aspirin in them including headache powders such as BC or Goody's), Excedrin migraine.   Day Before Surgery at [place] will be asked to take in a light diet the day before surgery. You will be advised you can have clear liquids up until 3 hours before your surgery.     Eat a light diet the day before surgery.  Examples including soups, broths, toast, yogurt, mashed potatoes.  AVOID GAS PRODUCING FOODS. Things to avoid include carbonated beverages (fizzy beverages, sodas), raw fruits and raw vegetables (uncooked), or beans.    If your bowels are filled with gas, your surgeon will have difficulty visualizing your pelvic organs which increases your  surgical risks.   Your role in recovery Your role is to become active as soon as directed by your doctor, while still giving yourself time to heal.  Rest when you feel tired. You will be asked to do the following in order to speed your recovery:   - Cough and breathe deeply. This helps to clear and expand your lungs and can prevent pneumonia after surgery.  - [place]. Do mild physical activity. Walking or moving your legs help your circulation and body functions return to normal. Do not try to get up or walk alone the first time after surgery.   -If you develop swelling on one leg or the other, pain in the back of your leg, redness/warmth in one of your legs, please call the office or go to the Emergency Room to have a doppler to rule out a blood clot. For shortness of breath, chest pain-seek care in the Emergency Room as soon as possible. - Actively manage your pain. Managing your pain lets you move in comfort. We will ask you to rate your pain on a scale of zero to 10. It is your responsibility to tell your doctor or nurse where and how much you hurt so your pain can be treated.   Special Considerations -If you are diabetic, you may be placed on insulin after surgery to have closer control over your blood sugars to promote healing and recovery.  This does not mean that you will be discharged on insulin.  If applicable, your oral  antidiabetics will be resumed when you are tolerating a solid diet.   -Your final pathology results from surgery should be available around one week after surgery and the results will be relayed to you when available.   -Dr. Lahoma Crocker is the surgeon that assists your GYN Oncologist with surgery.  If you end up staying the night, the next day after your surgery you will either see Dr. Denman George, Dr. Berline Lopes, or Dr. Lahoma Crocker.   -FMLA forms can be faxed to [phone] and please allow 5-7 business days for completion.   Pain Management  After Surgery -You have been prescribed your pain medication and bowel regimen medications before surgery so that you can have these available when you are discharged from the hospital. The pain medication is for use ONLY AFTER surgery and a new prescription will not be given.    -Make sure that you have Tylenol and Ibuprofen at home to use on a regular basis after surgery for pain control. We recommend alternating the medications every hour to six hours since they work differently and are processed in the body differently for pain relief.   -I would recommend using the ibuprofen sparingly since it can increase your chance of gastrointestinal bleeding while taking lexapro.   -Review the attached handout on narcotic use and their risks and side effects.    Bowel Regimen -You have been prescribed Sennakot-S to take nightly to prevent constipation especially if you are taking the narcotic pain medication intermittently.  It is important to prevent constipation and drink adequate amounts of liquids. You can stop taking this medication when you are not taking pain medication and you are back on your normal bowel routine.   Risks of Surgery Risks of surgery are low but include bleeding, infection, damage to surrounding structures, re-operation, blood clots, and very rarely death.     Blood Transfusion Information (For the consent to be signed before surgery)   We will be checking your blood type before surgery so in case of emergencies, we will know what type of blood you would need.                                             WHAT IS A BLOOD TRANSFUSION?   A transfusion is the replacement of blood or some of its parts. Blood is made up of multiple cells which provide different functions. Red blood cells carry oxygen and are used for blood loss replacement. White blood cells fight against infection. Platelets control bleeding. Plasma helps clot blood. Other blood products are available for  specialized needs, such as hemophilia or other clotting disorders. BEFORE THE TRANSFUSION  Who gives blood for transfusions?  You may be able to donate blood to be used at a later date on yourself (autologous donation). Relatives can be asked to donate blood. This is generally not any safer than if you have received blood from a stranger. The same precautions are taken to ensure safety when a relative's blood is donated. Healthy volunteers who are fully evaluated to make sure their blood is safe. This is blood bank blood. Transfusion therapy is the safest it has ever been in the practice of medicine. Before blood is taken from a donor, a complete history is taken to make sure that person has no history of diseases nor engages in risky social behavior (examples are intravenous drug  use or sexual activity with multiple partners). The donor's travel history is screened to minimize risk of transmitting infections, such as malaria. The donated blood is tested for signs of infectious diseases, such as HIV and hepatitis. The blood is then tested to be sure it is compatible with you in order to minimize the chance of a transfusion reaction. If you or a relative donates blood, this is often done in anticipation of surgery and is not appropriate for emergency situations. It takes many days to process the donated blood. RISKS AND COMPLICATIONS Although transfusion therapy is very safe and saves many lives, the main dangers of transfusion include:  Getting an infectious disease. Developing a transfusion reaction. This is an allergic reaction to something in the blood you were given. Every precaution is taken to prevent this. The decision to have a blood transfusion has been considered carefully by your caregiver before blood is given. Blood is not given unless the benefits outweigh the risks.   AFTER SURGERY INSTRUCTIONS   Return to work: 4-6 weeks if applicable   You will need to perform once daily lovenox  injections starting the day after surgery to prevent blood clots. If the surgery is performed through small incisions, you will need to take these injections for 2 weeks. If larger incision is made, it will be for 4 weeks.   Activity: 1. Be up and out of the bed during the day.  Take a nap if needed.  You may walk up steps but be careful and use the hand rail.  Stair climbing will tire you more than you think, you may need to stop part way and rest.    2. No lifting or straining for 6 weeks over 10 pounds. No pushing, pulling, straining for 6 weeks.   3. No driving for 1 week(s).  Do not drive if you are taking narcotic pain medicine and make sure that your reaction time has returned.    4. You can shower as soon as the next day after surgery. Shower daily.  Use your regular soap and water (not directly on the incision) and pat your incision(s) dry afterwards; don't rub.  No tub baths or submerging your body in water until cleared by your surgeon. If you have the soap that was given to you by pre-surgical testing that was used before surgery, you do not need to use it afterwards because this can irritate your incisions.    5. No sexual activity and nothing in the vagina for 8 weeks.   6. You may experience a small amount of clear drainage from your incisions, which is normal.  If the drainage persists, increases, or changes color please call the office.   7. Do not use creams, lotions, or ointments such as neosporin on your incisions after surgery until advised by your surgeon because they can cause removal of the dermabond glue on your incisions.     8. You may experience vaginal spotting after surgery or around the 6-8 week mark from surgery when the stitches at the top of the vagina begin to dissolve.  The spotting is normal but if you experience heavy bleeding, call our office.   9. Take Tylenol or ibuprofen first for pain and only use narcotic pain medication for severe pain not relieved by  the Tylenol or Ibuprofen.  Monitor your Tylenol intake to a max of 4,000 mg in a 24 hour period. You can alternate these medications after surgery.   Diet: 1. Low sodium  Heart Healthy Diet is recommended but you are cleared to resume your normal (before surgery) diet after your procedure.   2. It is safe to use a laxative, such as Miralax or Colace, if you have difficulty moving your bowels. You have been prescribed Sennakot at bedtime every evening to keep bowel movements regular and to prevent constipation.     Wound Care: 1. Keep clean and dry.  Shower daily.   Reasons to call the Doctor: Fever - Oral temperature greater than 100.4 degrees Fahrenheit Foul-smelling vaginal discharge Difficulty urinating Nausea and vomiting Increased pain at the site of the incision that is unrelieved with pain medicine. Difficulty breathing with or without chest pain New calf pain especially if only on one side Sudden, continuing increased vaginal bleeding with or without clots.   Contacts: For questions or concerns you should contact:   Dr. Jeral Pinch at [phone]   Joylene John, NP at [phone]   After Hours: call [phone] and have the GYN Oncologist paged/contacted (after 5 pm or on the weekends).   Messages sent via mychart are for non-urgent matters and are not responded to after hours so for urgent needs, please call the after hours number.

## 2021-05-03 ENCOUNTER — Encounter (HOSPITAL_COMMUNITY): Payer: Self-pay

## 2021-05-03 ENCOUNTER — Encounter (HOSPITAL_COMMUNITY)
Admission: RE | Admit: 2021-05-03 | Discharge: 2021-05-03 | Disposition: A | Discharge status: Home / Self Care | Payer: Medicare Other | Source: Ambulatory Visit | Attending: Gynecologic Oncology | Admitting: Gynecologic Oncology

## 2021-05-03 ENCOUNTER — Other Ambulatory Visit: Payer: Self-pay

## 2021-05-03 DIAGNOSIS — C482 Malignant neoplasm of peritoneum, unspecified: Secondary | ICD-10-CM

## 2021-05-03 DIAGNOSIS — Z01812 Encounter for preprocedural laboratory examination: Secondary | ICD-10-CM | POA: Insufficient documentation

## 2021-05-03 DIAGNOSIS — Z79899 Other long term (current) drug therapy: Secondary | ICD-10-CM | POA: Insufficient documentation

## 2021-05-03 DIAGNOSIS — C579 Malignant neoplasm of female genital organ, unspecified: Secondary | ICD-10-CM

## 2021-05-03 HISTORY — DX: Pneumonia, unspecified organism: J18.9

## 2021-05-03 LAB — CBC
HCT: 34.6 % — ABNORMAL LOW (ref 36.0–46.0)
Hemoglobin: 11.2 g/dL — ABNORMAL LOW (ref 12.0–15.0)
MCH: 32.7 pg (ref 26.0–34.0)
MCHC: 32.4 g/dL (ref 30.0–36.0)
MCV: 101.2 fL — ABNORMAL HIGH (ref 80.0–100.0)
Platelets: 234 10*3/uL (ref 150–400)
RBC: 3.42 MIL/uL — ABNORMAL LOW (ref 3.87–5.11)
RDW: 19.2 % — ABNORMAL HIGH (ref 11.5–15.5)
WBC: 3.9 10*3/uL — ABNORMAL LOW (ref 4.0–10.5)
nRBC: 0 % (ref 0.0–0.2)

## 2021-05-03 LAB — URINALYSIS, ROUTINE W REFLEX MICROSCOPIC
Bilirubin Urine: NEGATIVE
Glucose, UA: NEGATIVE mg/dL
Ketones, ur: NEGATIVE mg/dL
Leukocytes,Ua: NEGATIVE
Nitrite: NEGATIVE
Protein, ur: NEGATIVE mg/dL
Specific Gravity, Urine: 1.015 (ref 1.005–1.030)
pH: 6 (ref 5.0–8.0)

## 2021-05-03 LAB — BASIC METABOLIC PANEL
Anion gap: 8 (ref 5–15)
BUN: 24 mg/dL — ABNORMAL HIGH (ref 8–23)
CO2: 25 mmol/L (ref 22–32)
Calcium: 9.6 mg/dL (ref 8.9–10.3)
Chloride: 102 mmol/L (ref 98–111)
Creatinine, Ser: 0.65 mg/dL (ref 0.44–1.00)
GFR, Estimated: >60 mL/min (ref >60)
Glucose, Bld: 87 mg/dL (ref 70–99)
Potassium: 4.9 mmol/L (ref 3.5–5.1)
Sodium: 135 mmol/L (ref 135–145)

## 2021-05-05 ENCOUNTER — Telehealth: Payer: Self-pay | Admitting: Gynecologic Oncology

## 2021-05-05 LAB — URINE CULTURE: Culture: 80000 — AB

## 2021-05-05 NOTE — Telephone Encounter (Signed)
Called patient and left message. Wanted to discuss preop urine culture results. Based on the results, after discussion with Dr. Alvy Bimler, the plan was to treat given immunosuppression from recent chemotherapy and the upcoming placement of a foley catheter for surgery. Upon further review, it appears the patient takes lactobacillus daily.   In the message left for the patient with the information above, I asked the patient to stop taking lactobacillus starting today and we would plan to recheck her urine next week. This plan was discussed with Dr. Alvy Bimler. Advised the patient to please call the office back to confirm she received this message. Also advised, if she is having symptoms, we would proceed with treatment.

## 2021-05-06 ENCOUNTER — Telehealth: Payer: Self-pay

## 2021-05-06 DIAGNOSIS — R8271 Bacteriuria: Secondary | ICD-10-CM

## 2021-05-06 NOTE — Telephone Encounter (Signed)
Following up with Ms. Mares, patient states she did receive Joylene John, NP message yesterday and has stopped taking lactobacillus supplement. She denies urinary symptoms (fever, chills, burning, pain or urgency). She states she does have some urinary frequency but this is normal for her and she attributes it to "a 75 year old bladder". Lab appointment scheduled for 05/09/21 at 0815 to recheck urine. Patient is in agreement of date and time of appointment. Instructed to call with any questions or concerns.

## 2021-05-09 ENCOUNTER — Telehealth: Payer: Self-pay

## 2021-05-09 ENCOUNTER — Inpatient Hospital Stay: Payer: Medicare Other

## 2021-05-09 ENCOUNTER — Other Ambulatory Visit: Payer: Self-pay

## 2021-05-09 DIAGNOSIS — C482 Malignant neoplasm of peritoneum, unspecified: Secondary | ICD-10-CM | POA: Diagnosis not present

## 2021-05-09 DIAGNOSIS — R8271 Bacteriuria: Secondary | ICD-10-CM

## 2021-05-09 LAB — URINALYSIS, COMPLETE (UACMP) WITH MICROSCOPIC
Bilirubin Urine: NEGATIVE
Glucose, UA: NEGATIVE mg/dL
Ketones, ur: 5 mg/dL — AB
Leukocytes,Ua: NEGATIVE
Nitrite: NEGATIVE
Protein, ur: NEGATIVE mg/dL
Specific Gravity, Urine: 1.028 (ref 1.005–1.030)
pH: 5 (ref 5.0–8.0)

## 2021-05-09 NOTE — Telephone Encounter (Signed)
Spoke with Leslie Rivera this morning and reviewed urinalysis results. Urine showing moderate blood and rare bacteria. Urine sent for culture. Patient denies vaginal bleeding but endorses occasional discharge and cramping. Patient denies urinary symptoms. Patient states "I feel like my bladder is weak and I have frequency but I don't consider this to be abnormal for me ."  Instructed patient that our office will contact her once urine culture results are received. Patient verbalized understanding.

## 2021-05-10 ENCOUNTER — Telehealth: Payer: Self-pay | Admitting: *Deleted

## 2021-05-10 LAB — URINE CULTURE: Culture: NO GROWTH

## 2021-05-10 NOTE — Telephone Encounter (Signed)
Leslie Rivera returned my phone call. We discussed that the results of her urine culture was negative for bacteria and she will not need an antibiotic. I encouraged her to call with any further questions or concerns.

## 2021-05-10 NOTE — Telephone Encounter (Signed)
Left a message for patient to return call regarding urine culture results.

## 2021-05-11 ENCOUNTER — Telehealth: Payer: Self-pay

## 2021-05-11 NOTE — Telephone Encounter (Signed)
Telephone call to check on pre-operative status.  Patient compliant with pre-operative instructions.  Reinforced NPO after midnight.  No questions or concerns voiced.  Instructed to call for any needs.   

## 2021-05-12 ENCOUNTER — Encounter (HOSPITAL_COMMUNITY): Payer: Self-pay | Admitting: Gynecologic Oncology

## 2021-05-12 ENCOUNTER — Encounter (HOSPITAL_COMMUNITY)
Admission: RE | Disposition: A | Discharge status: Home / Self Care | Payer: Self-pay | Source: Home / Self Care | Attending: Gynecologic Oncology

## 2021-05-12 ENCOUNTER — Inpatient Hospital Stay (HOSPITAL_COMMUNITY): Payer: Medicare Other | Admitting: Physician Assistant

## 2021-05-12 ENCOUNTER — Observation Stay (HOSPITAL_COMMUNITY)
Admission: RE | Admit: 2021-05-12 | Discharge: 2021-05-13 | Disposition: A | Discharge status: Home / Self Care | Payer: Medicare Other | Attending: Gynecologic Oncology | Admitting: Gynecologic Oncology

## 2021-05-12 ENCOUNTER — Inpatient Hospital Stay (HOSPITAL_COMMUNITY): Payer: Medicare Other | Admitting: Anesthesiology

## 2021-05-12 ENCOUNTER — Other Ambulatory Visit: Payer: Self-pay

## 2021-05-12 DIAGNOSIS — C5701 Malignant neoplasm of right fallopian tube: Secondary | ICD-10-CM | POA: Insufficient documentation

## 2021-05-12 DIAGNOSIS — C579 Malignant neoplasm of female genital organ, unspecified: Secondary | ICD-10-CM

## 2021-05-12 DIAGNOSIS — C786 Secondary malignant neoplasm of retroperitoneum and peritoneum: Secondary | ICD-10-CM | POA: Diagnosis not present

## 2021-05-12 DIAGNOSIS — D62 Acute posthemorrhagic anemia: Secondary | ICD-10-CM

## 2021-05-12 DIAGNOSIS — C562 Malignant neoplasm of left ovary: Secondary | ICD-10-CM | POA: Diagnosis not present

## 2021-05-12 DIAGNOSIS — C482 Malignant neoplasm of peritoneum, unspecified: Secondary | ICD-10-CM | POA: Diagnosis not present

## 2021-05-12 DIAGNOSIS — Z85038 Personal history of other malignant neoplasm of large intestine: Secondary | ICD-10-CM | POA: Diagnosis not present

## 2021-05-12 DIAGNOSIS — Z01818 Encounter for other preprocedural examination: Secondary | ICD-10-CM

## 2021-05-12 DIAGNOSIS — C5702 Malignant neoplasm of left fallopian tube: Principal | ICD-10-CM | POA: Insufficient documentation

## 2021-05-12 DIAGNOSIS — G8918 Other acute postprocedural pain: Secondary | ICD-10-CM | POA: Diagnosis present

## 2021-05-12 DIAGNOSIS — Z20822 Contact with and (suspected) exposure to covid-19: Secondary | ICD-10-CM | POA: Insufficient documentation

## 2021-05-12 DIAGNOSIS — C561 Malignant neoplasm of right ovary: Secondary | ICD-10-CM | POA: Diagnosis not present

## 2021-05-12 DIAGNOSIS — C563 Malignant neoplasm of bilateral ovaries: Secondary | ICD-10-CM | POA: Diagnosis not present

## 2021-05-12 DIAGNOSIS — D509 Iron deficiency anemia, unspecified: Secondary | ICD-10-CM | POA: Diagnosis not present

## 2021-05-12 DIAGNOSIS — Z79899 Other long term (current) drug therapy: Secondary | ICD-10-CM | POA: Diagnosis not present

## 2021-05-12 DIAGNOSIS — R109 Unspecified abdominal pain: Secondary | ICD-10-CM | POA: Diagnosis present

## 2021-05-12 DIAGNOSIS — E039 Hypothyroidism, unspecified: Secondary | ICD-10-CM | POA: Diagnosis not present

## 2021-05-12 LAB — TYPE AND SCREEN
ABO/RH(D): O NEG
Antibody Screen: NEGATIVE

## 2021-05-12 LAB — SARS CORONAVIRUS 2 BY RT PCR (HOSPITAL ORDER, PERFORMED IN ~~LOC~~ HOSPITAL LAB): SARS Coronavirus 2: NEGATIVE

## 2021-05-12 SURGERY — XI ROBOTIC ASSISTED TOTAL HYSTERECTOMY BILATERAL SALPINGO OOPHORECTOMY WITH OMENTECTOMY AND DEBULKING
Anesthesia: General

## 2021-05-12 MED ORDER — OXYCODONE HCL 5 MG PO TABS
ORAL_TABLET | ORAL | Status: AC
  Filled 2021-05-12: qty 1

## 2021-05-12 MED ORDER — SODIUM CHLORIDE (PF) 0.9 % IJ SOLN
INTRAMUSCULAR | Status: DC | PRN
  Administered 2021-05-12: 25 mL

## 2021-05-12 MED ORDER — EPHEDRINE SULFATE-NACL 50-0.9 MG/10ML-% IV SOSY
PREFILLED_SYRINGE | INTRAVENOUS | Status: DC | PRN
  Administered 2021-05-12: 7.5 mg via INTRAVENOUS
  Administered 2021-05-12: 5 mg via INTRAVENOUS

## 2021-05-12 MED ORDER — ROCURONIUM BROMIDE 10 MG/ML (PF) SYRINGE
PREFILLED_SYRINGE | INTRAVENOUS | Status: AC
  Filled 2021-05-12: qty 10

## 2021-05-12 MED ORDER — HEPARIN SODIUM (PORCINE) 5000 UNIT/ML IJ SOLN
5000.0000 [IU] | INTRAMUSCULAR | Status: AC
  Administered 2021-05-12: 11:00:00 5000 [IU] via SUBCUTANEOUS
  Filled 2021-05-12: qty 1

## 2021-05-12 MED ORDER — FENTANYL CITRATE PF 50 MCG/ML IJ SOSY
PREFILLED_SYRINGE | INTRAMUSCULAR | Status: AC
  Filled 2021-05-12: qty 3

## 2021-05-12 MED ORDER — HYDROMORPHONE HCL 1 MG/ML IJ SOLN
INTRAMUSCULAR | Status: DC | PRN
  Administered 2021-05-12 (×4): .5 mg via INTRAVENOUS

## 2021-05-12 MED ORDER — BUPIVACAINE HCL 0.25 % IJ SOLN
INTRAMUSCULAR | Status: AC
  Filled 2021-05-12: qty 1

## 2021-05-12 MED ORDER — BUPIVACAINE LIPOSOME 1.3 % IJ SUSP
INTRAMUSCULAR | Status: AC
  Filled 2021-05-12: qty 20

## 2021-05-12 MED ORDER — ONDANSETRON HCL 4 MG/2ML IJ SOLN
INTRAMUSCULAR | Status: AC
  Filled 2021-05-12: qty 2

## 2021-05-12 MED ORDER — SENNA 8.6 MG PO TABS
1.0000 | ORAL_TABLET | Freq: Two times a day (BID) | ORAL | Status: DC
  Administered 2021-05-12 – 2021-05-13 (×2): 8.6 mg via ORAL
  Filled 2021-05-12 (×2): qty 1

## 2021-05-12 MED ORDER — CEFAZOLIN SODIUM-DEXTROSE 2-4 GM/100ML-% IV SOLN
2.0000 g | INTRAVENOUS | Status: AC
  Administered 2021-05-12: 14:00:00 2 g via INTRAVENOUS
  Filled 2021-05-12: qty 100

## 2021-05-12 MED ORDER — BUPIVACAINE LIPOSOME 1.3 % IJ SUSP
INTRAMUSCULAR | Status: DC | PRN
  Administered 2021-05-12: 20 mL

## 2021-05-12 MED ORDER — SUGAMMADEX SODIUM 200 MG/2ML IV SOLN
INTRAVENOUS | Status: DC | PRN
  Administered 2021-05-12: 150 mg via INTRAVENOUS

## 2021-05-12 MED ORDER — ACETAMINOPHEN 500 MG PO TABS
1000.0000 mg | ORAL_TABLET | ORAL | Status: AC
  Administered 2021-05-12: 11:00:00 1000 mg via ORAL
  Filled 2021-05-12: qty 2

## 2021-05-12 MED ORDER — FENTANYL CITRATE (PF) 250 MCG/5ML IJ SOLN
INTRAMUSCULAR | Status: AC
  Filled 2021-05-12: qty 5

## 2021-05-12 MED ORDER — KCL IN DEXTROSE-NACL 20-5-0.45 MEQ/L-%-% IV SOLN
INTRAVENOUS | Status: DC
  Filled 2021-05-12 (×2): qty 1000

## 2021-05-12 MED ORDER — KETOROLAC TROMETHAMINE 30 MG/ML IJ SOLN
INTRAMUSCULAR | Status: AC
  Filled 2021-05-12: qty 1

## 2021-05-12 MED ORDER — ACETAMINOPHEN 10 MG/ML IV SOLN
INTRAVENOUS | Status: AC
  Filled 2021-05-12: qty 100

## 2021-05-12 MED ORDER — HYDROMORPHONE HCL 2 MG/ML IJ SOLN
INTRAMUSCULAR | Status: AC
  Filled 2021-05-12: qty 1

## 2021-05-12 MED ORDER — DEXAMETHASONE SODIUM PHOSPHATE 10 MG/ML IJ SOLN
INTRAMUSCULAR | Status: DC | PRN
  Administered 2021-05-12: 8 mg via INTRAVENOUS

## 2021-05-12 MED ORDER — OXYCODONE HCL 5 MG PO TABS
5.0000 mg | ORAL_TABLET | ORAL | Status: DC | PRN
  Administered 2021-05-13: 10 mg via ORAL
  Administered 2021-05-13 (×2): 5 mg via ORAL
  Filled 2021-05-12 (×2): qty 2
  Filled 2021-05-12 (×2): qty 1

## 2021-05-12 MED ORDER — ONDANSETRON HCL 4 MG/2ML IJ SOLN: 4.0000 mg | Freq: Four times a day (QID) | INTRAMUSCULAR | Status: DC | PRN

## 2021-05-12 MED ORDER — PROPOFOL 10 MG/ML IV BOLUS
INTRAVENOUS | Status: DC | PRN
  Administered 2021-05-12: 150 mg via INTRAVENOUS
  Administered 2021-05-12: 40 mg via INTRAVENOUS

## 2021-05-12 MED ORDER — STERILE WATER FOR IRRIGATION IR SOLN
Status: DC | PRN
  Administered 2021-05-12: 3000 mL
  Administered 2021-05-12: 1000 mL

## 2021-05-12 MED ORDER — ORAL CARE MOUTH RINSE: 15.0000 mL | Freq: Once | OROMUCOSAL | Status: AC

## 2021-05-12 MED ORDER — FENTANYL CITRATE (PF) 250 MCG/5ML IJ SOLN
INTRAMUSCULAR | Status: DC | PRN
  Administered 2021-05-12 (×2): 25 ug via INTRAVENOUS
  Administered 2021-05-12: 50 ug via INTRAVENOUS
  Administered 2021-05-12: 100 ug via INTRAVENOUS
  Administered 2021-05-12: 50 ug via INTRAVENOUS
  Administered 2021-05-12: 100 ug via INTRAVENOUS

## 2021-05-12 MED ORDER — ONDANSETRON HCL 4 MG/2ML IJ SOLN: 4.0000 mg | Freq: Once | INTRAMUSCULAR | Status: DC | PRN

## 2021-05-12 MED ORDER — EPHEDRINE 5 MG/ML INJ
INTRAVENOUS | Status: AC
  Filled 2021-05-12: qty 5

## 2021-05-12 MED ORDER — LIDOCAINE HCL (PF) 2 % IJ SOLN
INTRAMUSCULAR | Status: AC
  Filled 2021-05-12: qty 5

## 2021-05-12 MED ORDER — DEXAMETHASONE SODIUM PHOSPHATE 4 MG/ML IJ SOLN: 4.0000 mg | INTRAMUSCULAR | Status: DC

## 2021-05-12 MED ORDER — ACETAMINOPHEN 325 MG PO TABS
650.0000 mg | ORAL_TABLET | ORAL | Status: DC | PRN
  Administered 2021-05-13: 650 mg via ORAL
  Filled 2021-05-12: qty 2

## 2021-05-12 MED ORDER — ENSURE PRE-SURGERY PO LIQD: 592.0000 mL | Freq: Once | ORAL | Status: DC

## 2021-05-12 MED ORDER — IBUPROFEN 400 MG PO TABS
600.0000 mg | ORAL_TABLET | Freq: Four times a day (QID) | ORAL | Status: DC
  Administered 2021-05-12 – 2021-05-13 (×3): 600 mg via ORAL
  Filled 2021-05-12 (×3): qty 1

## 2021-05-12 MED ORDER — FENTANYL CITRATE PF 50 MCG/ML IJ SOSY
25.0000 ug | PREFILLED_SYRINGE | INTRAMUSCULAR | Status: DC | PRN
  Administered 2021-05-12 (×3): 50 ug via INTRAVENOUS

## 2021-05-12 MED ORDER — HYDROMORPHONE HCL 1 MG/ML IJ SOLN
0.5000 mg | INTRAMUSCULAR | Status: DC | PRN
  Administered 2021-05-12 (×2): 0.5 mg via INTRAVENOUS

## 2021-05-12 MED ORDER — SODIUM CHLORIDE (PF) 0.9 % IJ SOLN
INTRAMUSCULAR | Status: AC
  Filled 2021-05-12: qty 10

## 2021-05-12 MED ORDER — LACTATED RINGERS IR SOLN
Status: DC | PRN
  Administered 2021-05-12: 1000 mL

## 2021-05-12 MED ORDER — ENOXAPARIN SODIUM 40 MG/0.4ML IJ SOSY
40.0000 mg | PREFILLED_SYRINGE | INTRAMUSCULAR | Status: DC
  Administered 2021-05-13: 40 mg via SUBCUTANEOUS
  Filled 2021-05-12: qty 0.4

## 2021-05-12 MED ORDER — ONDANSETRON HCL 4 MG PO TABS: 4.0000 mg | ORAL_TABLET | Freq: Four times a day (QID) | ORAL | Status: DC | PRN

## 2021-05-12 MED ORDER — DEXAMETHASONE SODIUM PHOSPHATE 10 MG/ML IJ SOLN
INTRAMUSCULAR | Status: AC
  Filled 2021-05-12: qty 1

## 2021-05-12 MED ORDER — LACTATED RINGERS IV SOLN: INTRAVENOUS | Status: DC | PRN

## 2021-05-12 MED ORDER — HYDROMORPHONE HCL 1 MG/ML IJ SOLN
INTRAMUSCULAR | Status: AC
  Filled 2021-05-12: qty 1

## 2021-05-12 MED ORDER — HYDROMORPHONE HCL 1 MG/ML IJ SOLN
0.2000 mg | INTRAMUSCULAR | Status: DC | PRN
  Administered 2021-05-12 – 2021-05-13 (×2): 0.5 mg via INTRAVENOUS
  Filled 2021-05-12 (×2): qty 1

## 2021-05-12 MED ORDER — SIMETHICONE 80 MG PO CHEW: 80.0000 mg | CHEWABLE_TABLET | Freq: Four times a day (QID) | ORAL | Status: DC | PRN

## 2021-05-12 MED ORDER — LACTATED RINGERS IV SOLN: INTRAVENOUS | Status: DC

## 2021-05-12 MED ORDER — LIDOCAINE 2% (20 MG/ML) 5 ML SYRINGE
INTRAMUSCULAR | Status: DC | PRN
Start: 2021-05-12 — End: 2021-05-12
  Administered 2021-05-12: 80 mg via INTRAVENOUS

## 2021-05-12 MED ORDER — ROCURONIUM BROMIDE 10 MG/ML (PF) SYRINGE
PREFILLED_SYRINGE | INTRAVENOUS | Status: DC | PRN
  Administered 2021-05-12: 60 mg via INTRAVENOUS
  Administered 2021-05-12: 5 mg via INTRAVENOUS

## 2021-05-12 MED ORDER — ACETAMINOPHEN 10 MG/ML IV SOLN
1000.0000 mg | Freq: Once | INTRAVENOUS | Status: AC
  Administered 2021-05-12: 18:00:00 1000 mg via INTRAVENOUS

## 2021-05-12 MED ORDER — BUPIVACAINE HCL 0.25 % IJ SOLN
INTRAMUSCULAR | Status: DC | PRN
  Administered 2021-05-12: 50 mL

## 2021-05-12 MED ORDER — CHLORHEXIDINE GLUCONATE 0.12 % MT SOLN
15.0000 mL | Freq: Once | OROMUCOSAL | Status: AC
  Administered 2021-05-12: 11:00:00 15 mL via OROMUCOSAL

## 2021-05-12 MED ORDER — ENSURE PRE-SURGERY PO LIQD: 296.0000 mL | Freq: Once | ORAL | Status: DC

## 2021-05-12 MED ORDER — KETOROLAC TROMETHAMINE 30 MG/ML IJ SOLN
30.0000 mg | Freq: Once | INTRAMUSCULAR | Status: AC
  Administered 2021-05-12: 18:00:00 30 mg via INTRAVENOUS

## 2021-05-12 MED ORDER — FENTANYL CITRATE (PF) 100 MCG/2ML IJ SOLN
INTRAMUSCULAR | Status: AC
  Filled 2021-05-12: qty 2

## 2021-05-12 MED ORDER — ONDANSETRON HCL 4 MG/2ML IJ SOLN
INTRAMUSCULAR | Status: DC | PRN
  Administered 2021-05-12: 4 mg via INTRAVENOUS

## 2021-05-12 SURGICAL SUPPLY — 79 items
ADH SKN CLS APL DERMABOND .7 (GAUZE/BANDAGES/DRESSINGS) ×1
AGENT HMST KT MTR STRL THRMB (HEMOSTASIS)
APL ESCP 34 STRL LF DISP (HEMOSTASIS)
APPLICATOR SURGIFLO ENDO (HEMOSTASIS) IMPLANT
BAG COUNTER SPONGE SURGICOUNT (BAG) IMPLANT
BAG LAPAROSCOPIC 12 15 PORT 16 (BASKET) IMPLANT
BAG RETRIEVAL 12/15 (BASKET)
BAG RETRIEVAL 12/15MM (BASKET)
BAG SPEC RTRVL LRG 6X4 10 (ENDOMECHANICALS)
BAG SPNG CNTER NS LX DISP (BAG)
BAG SURGICOUNT SPONGE COUNTING (BAG)
BLADE SURG SZ10 CARB STEEL (BLADE) IMPLANT
CELLS DAT CNTRL 66122 CELL SVR (MISCELLANEOUS) ×1 IMPLANT
COVER BACK TABLE 60X90IN (DRAPES) ×3 IMPLANT
COVER TIP SHEARS 8 DVNC (MISCELLANEOUS) ×1 IMPLANT
COVER TIP SHEARS 8MM DA VINCI (MISCELLANEOUS) ×3
DECANTER SPIKE VIAL GLASS SM (MISCELLANEOUS) IMPLANT
DERMABOND ADVANCED (GAUZE/BANDAGES/DRESSINGS) ×2
DERMABOND ADVANCED .7 DNX12 (GAUZE/BANDAGES/DRESSINGS) ×1 IMPLANT
DRAPE ARM DVNC X/XI (DISPOSABLE) ×4 IMPLANT
DRAPE COLUMN DVNC XI (DISPOSABLE) ×1 IMPLANT
DRAPE DA VINCI XI ARM (DISPOSABLE) ×12
DRAPE DA VINCI XI COLUMN (DISPOSABLE) ×3
DRAPE SHEET LG 3/4 BI-LAMINATE (DRAPES) ×3 IMPLANT
DRAPE SURG IRRIG POUCH 19X23 (DRAPES) ×3 IMPLANT
DRSG OPSITE POSTOP 4X6 (GAUZE/BANDAGES/DRESSINGS) ×2 IMPLANT
DRSG OPSITE POSTOP 4X8 (GAUZE/BANDAGES/DRESSINGS) IMPLANT
ELECT PENCIL ROCKER SW 15FT (MISCELLANEOUS) IMPLANT
ELECT REM PT RETURN 15FT ADLT (MISCELLANEOUS) ×3 IMPLANT
GAUZE 4X4 16PLY ~~LOC~~+RFID DBL (SPONGE) ×3 IMPLANT
GLOVE SURG ENC MOIS LTX SZ6 (GLOVE) ×12 IMPLANT
GLOVE SURG ENC MOIS LTX SZ6.5 (GLOVE) ×6 IMPLANT
GOWN STRL REUS W/ TWL LRG LVL3 (GOWN DISPOSABLE) ×4 IMPLANT
GOWN STRL REUS W/TWL LRG LVL3 (GOWN DISPOSABLE) ×12
HOLDER FOLEY CATH W/STRAP (MISCELLANEOUS) ×1 IMPLANT
IRRIG SUCT STRYKERFLOW 2 WTIP (MISCELLANEOUS) ×3
IRRIGATION SUCT STRKRFLW 2 WTP (MISCELLANEOUS) ×1 IMPLANT
KIT PROCEDURE DA VINCI SI (MISCELLANEOUS)
KIT PROCEDURE DVNC SI (MISCELLANEOUS) IMPLANT
KIT TURNOVER KIT A (KITS) IMPLANT
LIGASURE IMPACT 36 18CM CVD LR (INSTRUMENTS) ×2 IMPLANT
MANIPULATOR ADVINCU DEL 3.0 PL (MISCELLANEOUS) IMPLANT
MANIPULATOR UTERINE 4.5 ZUMI (MISCELLANEOUS) ×3 IMPLANT
NDL HYPO 21X1.5 SAFETY (NEEDLE) ×1 IMPLANT
NDL SPNL 18GX3.5 QUINCKE PK (NEEDLE) IMPLANT
NEEDLE HYPO 21X1.5 SAFETY (NEEDLE) ×3 IMPLANT
NEEDLE SPNL 18GX3.5 QUINCKE PK (NEEDLE) IMPLANT
OBTURATOR OPTICAL STANDARD 8MM (TROCAR) ×3
OBTURATOR OPTICAL STND 8 DVNC (TROCAR) ×1
OBTURATOR OPTICALSTD 8 DVNC (TROCAR) ×1 IMPLANT
PACK ROBOT GYN CUSTOM WL (TRAY / TRAY PROCEDURE) ×3 IMPLANT
PAD POSITIONING PINK XL (MISCELLANEOUS) ×3 IMPLANT
PORT ACCESS TROCAR AIRSEAL 12 (TROCAR) ×1 IMPLANT
PORT ACCESS TROCAR AIRSEAL 5M (TROCAR) ×2
POUCH SPECIMEN RETRIEVAL 10MM (ENDOMECHANICALS) IMPLANT
RETRACTOR WND ALEXIS 18 MED (MISCELLANEOUS) IMPLANT
RTRCTR WOUND ALEXIS 18CM MED (MISCELLANEOUS) ×3
SCRUB EXIDINE 4% CHG 4OZ (MISCELLANEOUS) ×3 IMPLANT
SEAL CANN UNIV 5-8 DVNC XI (MISCELLANEOUS) ×4 IMPLANT
SEAL XI 5MM-8MM UNIVERSAL (MISCELLANEOUS) ×12
SET IRRIG Y TYPE TUR BLADDER L (SET/KITS/TRAYS/PACK) ×2 IMPLANT
SET TRI-LUMEN FLTR TB AIRSEAL (TUBING) ×3 IMPLANT
SPONGE T-LAP 18X18 ~~LOC~~+RFID (SPONGE) IMPLANT
SURGIFLO W/THROMBIN 8M KIT (HEMOSTASIS) IMPLANT
SUT MNCRL AB 4-0 PS2 18 (SUTURE) ×6 IMPLANT
SUT PDS AB 1 TP1 96 (SUTURE) ×4 IMPLANT
SUT VIC AB 0 CT1 27 (SUTURE)
SUT VIC AB 0 CT1 27XBRD ANTBC (SUTURE) IMPLANT
SUT VIC AB 2-0 CT1 27 (SUTURE) ×6
SUT VIC AB 2-0 CT1 TAPERPNT 27 (SUTURE) IMPLANT
SUT VIC AB 4-0 PS2 18 (SUTURE) ×8 IMPLANT
SYR 10ML LL (SYRINGE) IMPLANT
TOWEL OR NON WOVEN STRL DISP B (DISPOSABLE) ×3 IMPLANT
TRAP SPECIMEN MUCUS 40CC (MISCELLANEOUS) IMPLANT
TRAY FOLEY MTR SLVR 16FR STAT (SET/KITS/TRAYS/PACK) ×3 IMPLANT
TROCAR XCEL NON-BLD 5MMX100MML (ENDOMECHANICALS) IMPLANT
UNDERPAD 30X36 HEAVY ABSORB (UNDERPADS AND DIAPERS) ×3 IMPLANT
WATER STERILE IRR 1000ML POUR (IV SOLUTION) ×3 IMPLANT
YANKAUER SUCT BULB TIP 10FT TU (MISCELLANEOUS) IMPLANT

## 2021-05-12 NOTE — Transfer of Care (Signed)
Immediate Anesthesia Transfer of Care Note  Patient: Leslie Rivera  Procedure(s) Performed: XI ROBOTIC ASSISTED TOTAL HYSTERECTOMY BILATERAL SALPINGO OOPHORECTOMY WITH OMENTECTOMY AND DEBULKING WITH MINI LAPAROTOMY  Patient Location: PACU  Anesthesia Type:General  Level of Consciousness: awake, alert , oriented and patient cooperative  Airway & Oxygen Therapy: Patient Spontanous Breathing and Patient connected to face mask oxygen  Post-op Assessment: Report given to RN, Post -op Vital signs reviewed and stable and Patient moving all extremities X 4  Post vital signs: stable  Last Vitals:  Vitals Value Taken Time  BP 135/87 05/12/21 1615  Temp    Pulse 84 05/12/21 1618  Resp 16 05/12/21 1618  SpO2 100 % 05/12/21 1618  Vitals shown include unvalidated device data.  Last Pain:  Vitals:   05/12/21 1051  TempSrc:   PainSc: 0-No pain      Patients Stated Pain Goal: 4 (74/[id number])  Complications: No notable events documented.

## 2021-05-12 NOTE — Interval H&P Note (Signed)
History and Physical Interval Note:  05/12/2021 12:41 PM  Leslie Rivera  has presented today for surgery, with the diagnosis of METASTATIC GYNECOLOGIC MALIGNANCY.  The various methods of treatment have been discussed with the patient and family. After consideration of risks, benefits and other options for treatment, the patient has consented to  Procedure(s): XI ROBOTIC ASSISTED TOTAL HYSTERECTOMY BILATERAL SALPINGO OOPHORECTOMY WITH OMENTECTOMY AND DEBULKING WITH POSSIBLE LAPAROTOMY (N/A) as a surgical intervention.  [name] history has been reviewed, patient examined, no change in status, stable for surgery.  I have reviewed the patient's chart and labs.  Questions were answered to the patient's satisfaction.     Lafonda Mosses

## 2021-05-12 NOTE — Anesthesia Procedure Notes (Signed)
Procedure Name: Intubation Date/Time: 05/12/2021 1:40 PM Performed by: Lavina Hamman, CRNA Pre-anesthesia Checklist: Patient identified, Emergency Drugs available, Suction available, Patient being monitored and Timeout performed Patient Re-evaluated:Patient Re-evaluated prior to induction Oxygen Delivery Method: Circle system utilized Preoxygenation: Pre-oxygenation with 100% oxygen Induction Type: IV induction Ventilation: Mask ventilation without difficulty Laryngoscope Size: Mac and 3 Grade View: Grade II Tube type: Oral Tube size: 7.0 mm Number of attempts: 1 Airway Equipment and Method: Stylet Placement Confirmation: ETT inserted through vocal cords under direct vision, positive ETCO2, CO2 detector and breath sounds checked- equal and bilateral Secured at: 21 cm Tube secured with: Tape Dental Injury: Teeth and Oropharynx as per pre-operative assessment  Comments: ATOI.  OG easy pass.

## 2021-05-12 NOTE — Addendum Note (Signed)
Addendum  created 05/12/21 1843 by Pervis Hocking, DO   Order Reconciliation Section accessed, Order list changed

## 2021-05-12 NOTE — Op Note (Signed)
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 chemotherapy, history of colon cancer with prior right hemicolectomy  Post-operative Diagnosis: same, carcinomatosis  Operation: Tumor debulking including robotic-assisted laparoscopic total hysterectomy with bilateral salpingoophorectomy, lysis of adhesions, excision and fulguration of peritoneal nodules, mini laparotomy for intra-abdominal palpation and omentectomy  Surgeon: Leslie Pinch MD  Assistant Surgeon: Lahoma Crocker MD (an MD assistant was necessary for tissue manipulation, management of robotic instrumentation, retraction and positioning due to the complexity of the case and hospital policies).   Anesthesia: GET  Urine Output: 150 cc  Operative Findings:  : On exam under anesthesia, small mobile uterus.  On intra-abdominal entry, normal upper abdominal survey including liver and stomach.  Right diaphragm with small peritoneal studding, all lesions less than 5 mm.  Omentum with an approximately 5 cm mass adherent to the lower anterior abdominal wall and bladder peritoneum.  Normal 6 cm uterus.  Somewhat atrophic appearing bilateral adnexa with tumor implants.  Sigmoid and rectal colon and mesentery with moderate peritoneal disease, all lesions less than 5 mm.  Multiple peritoneal implants on bilateral pelvic sidewalls and in the posterior cul-de-sac, all measuring less than 5 mm.  Multiple omental implants measuring several mm.  Approximately 1 cm area within the transverse colon mesentery, unclear if this is related to scar tissue from prior colon resection versus tumor implant.  No obvious pelvic adenopathy, no palpable para-aortic adenopathy. R1 resection at the end of surgery.  Estimated Blood Loss: 100 cc      Total IV Fluids: See I&O flowsheet         Specimens: uterus, cervix, bilateral tubes and ovaries, peritoneal nodules, omentum          Complications:  None apparent; patient tolerated the procedure well.         Disposition: PACU - hemodynamically stable.  Procedure Details  The patient was seen in the Holding Room. The risks, benefits, complications, treatment options, and expected outcomes were discussed with the patient.  The patient concurred with the proposed plan, giving informed consent.  The site of surgery properly noted/marked. The patient was identified as Leslie Rivera and the procedure verified as a Robotic-assisted interval debulking surgery.   After induction of anesthesia, the patient was draped and prepped in the usual sterile manner. Patient was placed in supine position after anesthesia and draped and prepped in the usual sterile manner as follows: Her arms were tucked to her side with all appropriate precautions.  The shoulders were stabilized with padded shoulder blocks applied to the acromium processes.  The patient was placed in the semi-lithotomy position in [place].  The perineum and vagina were prepped with CholoraPrep. The patient was draped after the CholoraPrep had been allowed to dry for 3 minutes.  A Time Out was held and the above information confirmed.  The urethra was prepped with Betadine. Foley catheter was placed.  A sterile speculum was placed in the vagina.  The cervix was grasped with a single-tooth tenaculum. The cervix was dilated with Kennon Rounds dilators.  The ZUMI uterine manipulator with a medium colpotomizer ring was placed without difficulty.  A pneum occluder balloon was placed over the manipulator.  OG tube placement was confirmed and to suction.   Next, a 10 mm skin incision was made 1 cm below the subcostal margin in the midclavicular line.  The 5 mm Optiview port and scope was used for direct entry.  Opening pressure  was under 10 mm CO2.  The abdomen was insufflated and the findings were noted as above.   At this point and all points during the procedure, the patient's  intra-abdominal pressure did not exceed 15 mmHg. Next, an 8 mm skin incision was made superior to the umbilicus and a right and left port were placed about 8 cm lateral to the robot port on the right and left side.  A fourth arm was placed on the right.  The 5 mm assist trocar was exchanged for a 10-12 mm port. All ports were placed under direct visualization.  The patient was placed in steep Trendelenburg.  The robot was docked in the normal manner.  Attention was first turned to the omental tumor implant adherent to the anterior abdominal wall and bladder peritoneum.  A combination of sharp and blunt dissection with short bursts of electrocautery was used to slowly lyse the adherent mass away from the underlying anterior wall peritoneum and bladder peritoneum.  Once freed, the omentum was placed in the upper abdomen.  Pelvis was inspected with findings as noted above.  Multiple peritoneal implants along the right pelvic sidewall were excised.  Multiple implants along the posterior cul-de-sac were fulgurated.  The right and left peritoneum were opened parallel to the IP ligament to open the retroperitoneal spaces bilaterally. The round ligaments were transected. The ureter was noted to be on the medial leaf of the broad ligament.  The peritoneum above the ureter was incised and stretched and the infundibulopelvic ligament was skeletonized, cauterized and cut.    The posterior peritoneum was taken down to the level of the KOH ring.  The anterior peritoneum was also taken down.  The bladder flap was created to the level of the KOH ring.  The uterine artery on the right side was skeletonized, cauterized and cut in the normal manner.  A similar procedure was performed on the left.  The colpotomy was made and the uterus, cervix, bilateral ovaries and tubes were amputated and delivered through the vagina.  Pedicles were inspected and excellent hemostasis was achieved.    Given proximity of omental implant to  the bladder, bladder was backfilled at this time to ensure integrity.  No leaking was noted.  Bladder was then emptied.  The colpotomy at the vaginal cuff was closed with Vicryl on a CT1 needle in a running manner.  Irrigation was used and excellent hemostasis was achieved.    At this point, robotic instruments were removed under direct visulaization.  The robot was undocked.  The supraumbilical trocar was removed and with the abdomen still insufflated, this incision was extended to 6-8 cm with a scalpel.  Monopolar electrocautery was used to open the incision down to the fascia.  The fascial trocar incision was extended using monopolar electrocautery and the peritoneum was also extended under direct visualization.  An Alexis retractor was placed.  Surgeon's hand was placed in the mid abdomen and periaortic lymph nodes were palpated as well as the omentum.  The omentum was delivered through the incision with findings noted above.  Given residual peritoneal disease as well as no clear indication that what was palpated in the transverse colon mesentery was in fact tumor, decision was made not to proceed with transverse colectomy.  A supra colic omentectomy was performed, sparing the short gastrics, excising all small tumor implants.  Omentum was then handed off the field.  The fascia of the minilaparotomy incision was closed with running looped #1 PDS sutures tied at  the midline.  Subcutaneous tissue was irrigated and Exparel was injected.  Subcutaneous tissue was reapproximated with 2-0 Vicryl in running fashion.  The fascia at the 10-12 mm port was closed with 0 Vicryl on a UR-5 needle.  The subcuticular tissue of all incisions was closed with 4-0 Vicryl and the skin was closed with 4-0 Monocryl in a subcuticular manner.  Dermabond was applied.  Honeycomb dressing was applied to the minilaparotomy site.  The vagina was swabbed with minimal bleeding noted.  Foley catheter was removed.  All sponge, lap and  needle counts were correct x  3.   The patient was transferred to the recovery room in stable condition.  Leslie Pinch, MD

## 2021-05-12 NOTE — Discharge Instructions (Addendum)
05/12/2021  Please start your lovenox injections tomorrow afternoon, 05/14/2021, approximately 12 pm. Continue to give yourself the injections once a day around the same time.  We have sent in ferrous gluconate (iron) to take once a day with food.  Do not take oxycodone and percocet together since percocet is oxycodone and tylenol together. Use one or the other for pain if needed.  You will have a white honeycomb dressing over your larger incision. This dressing can be removed 5 days after surgery and you do not need to reapply a new dressing. Once you remove the dressing, you will notice that you have the surgical glue (dermabond) on the incision and this will peel off on its own. You can get this dressing wet in the shower the days after surgery prior to removal on the 5th day.   Activity: 1. Be up and out of the bed during the day.  Take a nap if needed.  You may walk up steps but be careful and use the hand rail.  Stair climbing will tire you more than you think, you may need to stop part way and rest.   2. No lifting or straining for 6 weeks.  3. No driving until off narcotics and you can brake safely. For most patient this is 5-10 days. Do not drive if you are taking narcotic pain medicine.  4. Shower daily.  Use your regular soap and pat the incisions dry; don't rub.   5. No sexual activity and nothing in the vagina for 8 weeks.  Medications:  - Take ibuprofen and tylenol first line for pain control. Take these regularly (every 6 hours) to decrease the build up of pain.  - If necessary, for severe pain not relieved by ibuprofen, take oxycodone.  - While taking oxycodone you should take sennakot every night to reduce the likelihood of constipation. If this causes diarrhea, stop its use.  Diet: 1. Low sodium Heart Healthy Diet is recommended.  2. It is safe to use a laxative if you have difficulty moving your bowels.   Wound Care: 1. Keep clean and dry.  Shower  daily.  Reasons to call the Doctor:  Fever - Oral temperature greater than 100.4 degrees Fahrenheit Foul-smelling vaginal discharge Difficulty urinating Nausea and vomiting Increased pain at the site of the incision that is unrelieved with pain medicine. Difficulty breathing with or without chest pain New calf pain especially if only on one side Sudden, continuing increased vaginal bleeding with or without clots.   Follow-up: 1. See Jeral Pinch in 3 weeks as previously scheduled.  Contacts: For questions or concerns you should contact:  Dr. Jeral Pinch at [phone]  After hours and on week-ends call [phone] and ask to speak to the physician on call for Gynecologic Oncology

## 2021-05-12 NOTE — Anesthesia Postprocedure Evaluation (Signed)
Anesthesia Post Note  Patient: Leslie Rivera  Procedure(s) Performed: XI ROBOTIC ASSISTED TOTAL HYSTERECTOMY BILATERAL SALPINGO OOPHORECTOMY WITH OMENTECTOMY AND DEBULKING WITH MINI LAPAROTOMY     Patient location during evaluation: PACU Anesthesia Type: General Level of consciousness: awake and alert Pain management: pain level controlled Vital Signs Assessment: post-procedure vital signs reviewed and stable Respiratory status: spontaneous breathing, nonlabored ventilation and respiratory function stable Cardiovascular status: blood pressure returned to baseline and stable Postop Assessment: no apparent nausea or vomiting Anesthetic complications: no   No notable events documented.  Last Vitals:  Vitals:   05/12/21 1615 05/12/21 1630  BP: 135/87 131/78  Pulse: 84 80  Resp: 14 14  Temp:    SpO2: 100% 100%    Last Pain:  Vitals:   05/12/21 1630  TempSrc:   PainSc: Calvin

## 2021-05-12 NOTE — Anesthesia Preprocedure Evaluation (Addendum)
Anesthesia Evaluation  Patient identified by MRN, date of birth, ID band Patient awake    Reviewed: Allergy & Precautions, NPO status , Patient's Chart, lab work & pertinent test results  Airway Mallampati: II  TM Distance: >3 FB Neck ROM: Full    Dental  (+) Teeth Intact, Dental Advisory Given, Caps,    Pulmonary former smoker,    Pulmonary exam normal breath sounds clear to auscultation       Cardiovascular Exercise Tolerance: Good negative cardio ROS Normal cardiovascular exam Rhythm:Regular Rate:Normal     Neuro/Psych PSYCHIATRIC DISORDERS Anxiety  Neuromuscular disease    GI/Hepatic Neg liver ROS, Colon cancer Stomach cancer   Endo/Other  Hypothyroidism   Renal/GU negative Renal ROS     Musculoskeletal negative musculoskeletal ROS (+)   Abdominal   Peds  Hematology  (+) Blood dyscrasia, anemia ,   Anesthesia Other Findings Day of surgery medications reviewed with the patient.  Reproductive/Obstetrics METASTATIC GYNECOLOGIC MALIGNANCY                            Anesthesia Physical Anesthesia Plan  ASA: 4  Anesthesia Plan: General   Post-op Pain Management:    Induction: Intravenous  PONV Risk Score and Plan: 4 or greater and Dexamethasone, Ondansetron and Treatment may vary due to age or medical condition  Airway Management Planned: Oral ETT  Additional Equipment:   Intra-op Plan:   Post-operative Plan: Extubation in OR  Informed Consent: I have reviewed the patients History and Physical, chart, labs and discussed the procedure including the risks, benefits and alternatives for the proposed anesthesia with the patient or authorized representative who has indicated his/her understanding and acceptance.     Dental advisory given  Plan Discussed with: CRNA  Anesthesia Plan Comments: (2nd large bore PIV)       Anesthesia Quick Evaluation

## 2021-05-12 NOTE — Brief Op Note (Signed)
05/12/2021  4:04 PM  PATIENT:  Leslie Rivera  75 y.o. female  PRE-OPERATIVE DIAGNOSIS:  METASTATIC GYNECOLOGIC MALIGNANCY  POST-OPERATIVE DIAGNOSIS:  METASTATIC GYNECOLOGIC MALIGNANCY  PROCEDURE:  Procedure(s): XI ROBOTIC ASSISTED TOTAL HYSTERECTOMY BILATERAL SALPINGO OOPHORECTOMY WITH OMENTECTOMY AND DEBULKING WITH MINI LAPAROTOMY (N/A)  SURGEON:  Surgeon(s) and Role:    Lafonda Mosses, MD - Primary    * Lahoma Crocker, MD - Assisting   ANESTHESIA:   local and general  EBL:  100 mL   BLOOD ADMINISTERED:none  DRAINS: none   LOCAL MEDICATIONS USED:  MARCAINE     SPECIMEN:  uterus, cervix, bilateral adnexa, omentum, peritoneal implants  DISPOSITION OF SPECIMEN:  PATHOLOGY  COUNTS:  YES  TOURNIQUET:  * No tourniquets in log *  DICTATION: .Note written in EPIC  PLAN OF CARE: Discharge to home after PACU  PATIENT DISPOSITION:  PACU - hemodynamically stable.   Delay start of Pharmacological VTE agent (>24hrs) due to surgical blood loss or risk of bleeding: not applicable

## 2021-05-13 DIAGNOSIS — D62 Acute posthemorrhagic anemia: Secondary | ICD-10-CM

## 2021-05-13 DIAGNOSIS — C5702 Malignant neoplasm of left fallopian tube: Secondary | ICD-10-CM | POA: Diagnosis not present

## 2021-05-13 LAB — BASIC METABOLIC PANEL
Anion gap: 4 — ABNORMAL LOW (ref 5–15)
BUN: 12 mg/dL (ref 8–23)
CO2: 24 mmol/L (ref 22–32)
Calcium: 8.7 mg/dL — ABNORMAL LOW (ref 8.9–10.3)
Chloride: 107 mmol/L (ref 98–111)
Creatinine, Ser: 0.59 mg/dL (ref 0.44–1.00)
GFR, Estimated: >60 mL/min (ref >60)
Glucose, Bld: 126 mg/dL — ABNORMAL HIGH (ref 70–99)
Potassium: 4.2 mmol/L (ref 3.5–5.1)
Sodium: 135 mmol/L (ref 135–145)

## 2021-05-13 LAB — CBC
HCT: 27.9 % — ABNORMAL LOW (ref 36.0–46.0)
Hemoglobin: 9.2 g/dL — ABNORMAL LOW (ref 12.0–15.0)
MCH: 33.6 pg (ref 26.0–34.0)
MCHC: 33 g/dL (ref 30.0–36.0)
MCV: 101.8 fL — ABNORMAL HIGH (ref 80.0–100.0)
Platelets: 209 10*3/uL (ref 150–400)
RBC: 2.74 MIL/uL — ABNORMAL LOW (ref 3.87–5.11)
RDW: 18.8 % — ABNORMAL HIGH (ref 11.5–15.5)
WBC: 7.4 10*3/uL (ref 4.0–10.5)
nRBC: 0 % (ref 0.0–0.2)

## 2021-05-13 LAB — HEMOGLOBIN AND HEMATOCRIT, BLOOD
HCT: 27.6 % — ABNORMAL LOW (ref 36.0–46.0)
Hemoglobin: 9 g/dL — ABNORMAL LOW (ref 12.0–15.0)

## 2021-05-13 MED ORDER — ENOXAPARIN (LOVENOX) PATIENT EDUCATION KIT
PACK | Freq: Once | Status: AC
  Filled 2021-05-13: qty 1

## 2021-05-13 MED ORDER — FERROUS GLUCONATE 324 (38 FE) MG PO TABS: 324.0000 mg | ORAL_TABLET | Freq: Every day | ORAL | 3 refills | Status: DC

## 2021-05-13 NOTE — Discharge Summary (Addendum)
Physician Discharge Summary  Patient ID: Leslie Rivera MRN: [id number] DOB/AGE: [birth year] 73 y.o.  Admit date: 05/12/2021 Discharge date: 05/13/2021  Admission Diagnoses: Gynecologic malignancy [facility])  Discharge Diagnoses:  Principal Problem:   Gynecologic malignancy [facility]) Active Problems:   Postoperative abdominal pain   Postoperative anemia due to acute blood loss   Discharged Condition:  The patient is in good condition and stable for discharge.   Hospital Course: On 05/12/2021, the patient underwent the following: Procedure(s): XI ROBOTIC ASSISTED TOTAL HYSTERECTOMY BILATERAL SALPINGO OOPHORECTOMY WITH OMENTECTOMY AND DEBULKING WITH MINI LAPAROTOMY. Due to significant abdominal pain post-operatively in PACU, the patient was kept overnight for monitoring and pain control. On POD 1, her Hgb was 9.2 from 11.2 preop and Hct 27.9 from 34.6. A repeat H&H was obtained to assess for stability. She was discharged to home on postoperative day 1 tolerating a regular diet, ambulating, voiding without difficulty, pain controlled with oral medications. She was discharged home with prophylactic lovenox for 2 weeks post-operatively.   Consults: None    Significant Diagnostic Studies: Labs  Treatments: surgery: see above  Discharge Exam: Blood pressure (!) 95/59, pulse 64, temperature 98.1 F (36.7 C), temperature source Oral, resp. rate 15, height 5\' 6"  (1.676 m), weight 153 lb 9.6 oz (69.7 kg), SpO2 97 %. General appearance: alert, cooperative, and no distress Resp: clear to auscultation bilaterally Cardio: regular rate and rhythm, S1, S2 normal, no murmur, click, rub or gallop GI: abdomen soft, active bowel sounds, non-distended Extremities: extremities normal, atraumatic, no cyanosis or edema Incision/Wound: Lap sites to the abdomen with dermabond intact with surrounding ecchymosis. No active drainage. Mini lap incision with op site dressing in place. No drainage noted underneath.  Surrounding ecchymosis around the mini laparotomy incision.  Disposition: Discharge disposition: 01-Home or Self Care      Discharge Instructions     Call MD for:  difficulty breathing, headache or visual disturbances   Complete by: As directed    Call MD for:  difficulty breathing, headache or visual disturbances   Complete by: As directed    Call MD for:  extreme fatigue   Complete by: As directed    Call MD for:  extreme fatigue   Complete by: As directed    Call MD for:  hives   Complete by: As directed    Call MD for:  persistant dizziness or light-headedness   Complete by: As directed    Call MD for:  persistant dizziness or light-headedness   Complete by: As directed    Call MD for:  persistant nausea and vomiting   Complete by: As directed    Call MD for:  persistant nausea and vomiting   Complete by: As directed    Call MD for:  redness, tenderness, or signs of infection (pain, swelling, redness, odor or green/yellow discharge around incision site)   Complete by: As directed    Call MD for:  redness, tenderness, or signs of infection (pain, swelling, redness, odor or green/yellow discharge around incision site)   Complete by: As directed    Call MD for:  severe uncontrolled pain   Complete by: As directed    Call MD for:  severe uncontrolled pain   Complete by: As directed    Call MD for:  temperature >100.4   Complete by: As directed    Call MD for:  temperature >100.4   Complete by: As directed    Diet - low sodium heart healthy   Complete by: As  directed    Discharge wound care:   Complete by: As directed    You will have a white honeycomb dressing over your larger incision. This dressing can be removed 5 days after surgery and you do not need to reapply a new dressing. Once you remove the dressing, you will notice that you have the surgical glue (dermabond) on the incision and this will peel off on its own. You can get this dressing wet in the shower the days  after surgery prior to removal on the 5th day.   Driving Restrictions   Complete by: As directed    No driving for around 1 week.  Do not take narcotics and drive. You need to make sure your reaction time has returned.   Increase activity slowly   Complete by: As directed    Lifting restrictions   Complete by: As directed    No lifting greater than 10 lbs, pushing, pulling, straining for 6 weeks.   Sexual Activity Restrictions   Complete by: As directed    No sexual activity, nothing in the vagina, for 8 weeks.      Allergies as of 05/13/2021       Reactions   Barium-containing Compounds    Legs and arm cramping & caused fall   Hydrocodone Nausea And Vomiting   Can tolerate Oxycodone   Erythromycin    GI upset        Medication List     TAKE these medications    ACIDOPHILUS PO Take 1 capsule by mouth in the morning.   CALCIUM 600+D3 PO Take 1 tablet by mouth in the morning and at bedtime.   cetirizine 10 MG tablet Commonly known as: ZYRTEC Take 10 mg by mouth every 30 (thirty) days. As needed   chlorhexidine 0.12 % solution Commonly known as: PERIDEX SMARTSIG:1 Capful(s) By Mouth Twice Daily   cholecalciferol 25 MCG (1000 UNIT) tablet Commonly known as: VITAMIN D3 Take 1,000 Units by mouth in the morning.   diazepam 5 MG tablet Commonly known as: VALIUM Take 5 mg by mouth daily as needed for anxiety.   enoxaparin 40 MG/0.4ML injection Commonly known as: LOVENOX Inject 0.4 mLs (40 mg total) into the skin daily for 14 days.   escitalopram 10 MG tablet Commonly known as: LEXAPRO Take 1.5 tablets (15 mg total) by mouth daily.   ferrous gluconate 324 MG tablet Commonly known as: FERGON Take 1 tablet (324 mg total) by mouth daily with breakfast.   ibuprofen 600 MG tablet Commonly known as: ADVIL Take 1 tablet (600 mg total) by mouth every 6 (six) hours as needed for moderate pain. For AFTER surgery only   levothyroxine 75 MCG tablet Commonly known as:  SYNTHROID Take 75 mcg by mouth daily before breakfast.   ondansetron 8 MG tablet Commonly known as: Zofran Take 1 tablet (8 mg total) by mouth every 8 (eight) hours as needed for refractory nausea / vomiting.   oxyCODONE 5 MG immediate release tablet Commonly known as: Oxy IR/ROXICODONE Take 1 tablet (5 mg total) by mouth every 4 (four) hours as needed for severe pain. For AFTER surgery only, do not take and drive   oxyCODONE-acetaminophen 5-325 MG tablet Commonly known as: PERCOCET/ROXICET Take 1 tablet by mouth every 6 (six) hours as needed for severe pain.   prochlorperazine 10 MG tablet Commonly known as: COMPAZINE Take 1 tablet (10 mg total) by mouth every 6 (six) hours as needed (Nausea or vomiting).   senna-docusate 8.6-50 MG  tablet Commonly known as: Senokot-S Take 2 tablets by mouth at bedtime. For AFTER surgery, do not take if having diarrhea   Systane 0.4-0.3 % Soln Generic drug: Polyethyl Glycol-Propyl Glycol Place 1-2 drops into both eyes 3 (three) times daily as needed (dry/irritated eyes.).   valACYclovir 1000 MG tablet Commonly known as: VALTREX Take 1,000 mg by mouth daily as needed (Fever Blister).   zolpidem 10 MG tablet Commonly known as: AMBIEN TAKE 1 TABLET BY MOUTH AT BEDTIME AS NEEDED FOR SLEEP               Discharge Care Instructions  (From admission, onward)           Start     Ordered   05/13/21 0000  Discharge wound care:       Comments: You will have a white honeycomb dressing over your larger incision. This dressing can be removed 5 days after surgery and you do not need to reapply a new dressing. Once you remove the dressing, you will notice that you have the surgical glue (dermabond) on the incision and this will peel off on its own. You can get this dressing wet in the shower the days after surgery prior to removal on the 5th day.   05/13/21 1046            Follow-up Information     Lafonda Mosses, MD Follow up on  05/18/2021.   Specialty: Gynecologic Oncology Why: at 4:20pm will be a PHONE visit with Dr. Berline Lopes to check in and discuss pathology. IN PERSON visit will be on 06/06/21 at 2:15pm at the [facility]. Contact information: [place] [address] [phone]                 Greater than thirty minutes were spend for face to face discharge instructions and discharge orders/summary in EPIC.   Signed: Dorothyann Gibbs 05/13/2021, 11:06 AM

## 2021-05-13 NOTE — Progress Notes (Signed)
Patient was given discharge instructions and education for Lovenox Kit, and all questions were answered. Patient was stable for discharge and was walked to the main exit.

## 2021-05-13 NOTE — Care Management Obs Status (Signed)
[facility] NOTIFICATION   Patient Details  Name: Leslie Rivera MRN: [id number] Date of Birth: April 05, 1946   Medicare Observation Status Notification Given:  Yes    Sherie Don, LCSW 05/13/2021, 9:12 AM

## 2021-05-13 NOTE — Care Management CC44 (Signed)
Condition Code 44 Documentation Completed  Patient Details  Name: ANALEAH BRAME MRN: [id number] Date of Birth: May 16, 1946   Condition Code 44 given:  Yes Patient signature on Condition Code 44 notice:  Yes Documentation of 2 MD's agreement:  Yes Code 44 added to claim:  Yes    Sherie Don, LCSW 05/13/2021, 9:12 AM

## 2021-05-13 NOTE — Progress Notes (Signed)
Spoke with patient and sister on the phone about repeat H&H values. Dr. Berline Lopes also aware of results. Given stability, she is able to be discharged home at this time. All questions answered and RN notified as well.

## 2021-05-16 ENCOUNTER — Telehealth: Payer: Self-pay

## 2021-05-16 ENCOUNTER — Other Ambulatory Visit: Payer: Self-pay | Admitting: Hematology and Oncology

## 2021-05-16 NOTE — Telephone Encounter (Signed)
Spoke with Ms. Barbee Shropshire this morning. She states she is eating, drinking and urinating well. She had a BM yesterday and today. She is taking senokot as prescribed and encouraged her to drink plenty of water. She denies fever or chills. Incisions are dry and intact. Honeycomb dressing still in place. Instructed her to remove dressing 5 days after the procedure (tomorrow). Patient rates her pain as a 5/10, she states advil and tylenol help with the pain. She has not taken any oxycodone for 24 hrs. She states she was taking half an oxy (2.5mg ) if she felt like she needed it but she is feeling good today.   Instructed to call office with any fever, chills, purulent drainage, uncontrolled pain or any other questions or concerns. Patient verbalizes understanding.   Pt aware of post op appointments as well as the office number [phone] and after hours number [phone] to call if she has any questions or concerns

## 2021-05-18 ENCOUNTER — Other Ambulatory Visit: Payer: Self-pay | Admitting: Gynecologic Oncology

## 2021-05-18 ENCOUNTER — Inpatient Hospital Stay: Payer: Medicare Other | Admitting: Gynecologic Oncology

## 2021-05-18 ENCOUNTER — Telehealth: Payer: Self-pay

## 2021-05-18 DIAGNOSIS — G8918 Other acute postprocedural pain: Secondary | ICD-10-CM

## 2021-05-18 DIAGNOSIS — C482 Malignant neoplasm of peritoneum, unspecified: Secondary | ICD-10-CM

## 2021-05-18 LAB — SURGICAL PATHOLOGY

## 2021-05-18 MED ORDER — OXYCODONE HCL 5 MG PO TABS
5.0000 mg | ORAL_TABLET | ORAL | 0 refills | Status: DC | PRN
Start: 2021-05-18 — End: 2021-07-22

## 2021-05-18 NOTE — Telephone Encounter (Signed)
Spoke with Leslie Rivera to let her know her refill for oxycodone has been sent to her pharmacy. Patient verbalized understanding and grateful for the refill. Instructed to call with any needs.

## 2021-05-18 NOTE — Telephone Encounter (Signed)
Spoke with Ms. Jezek and rescheduled her phone appointment with Dr. Berline Lopes for Tuesday 11/29 at 4:20 pm due to the pathology results not being back yet.  Patient requested 10 more 5 mg Oxycodone's to get her through til Monday when she returns to work.  Patients pain level is 6-7.  Reports Ibuprofen makes her stomach upset. Otherwise, patient is eating, drinking, and having BMs, no fever or chills and incisions are okay.  Reminded patient of after hours number incase she needs something during the holiday. Told to call with any questions or concerns.

## 2021-05-24 ENCOUNTER — Telehealth: Payer: Self-pay | Admitting: Oncology

## 2021-05-24 ENCOUNTER — Inpatient Hospital Stay (HOSPITAL_BASED_OUTPATIENT_CLINIC_OR_DEPARTMENT_OTHER): Payer: Medicare Other | Admitting: Gynecologic Oncology

## 2021-05-24 ENCOUNTER — Encounter: Payer: Self-pay | Admitting: Gynecologic Oncology

## 2021-05-24 DIAGNOSIS — Z90710 Acquired absence of both cervix and uterus: Secondary | ICD-10-CM

## 2021-05-24 DIAGNOSIS — Z90722 Acquired absence of ovaries, bilateral: Secondary | ICD-10-CM

## 2021-05-24 DIAGNOSIS — C579 Malignant neoplasm of female genital organ, unspecified: Secondary | ICD-10-CM

## 2021-05-24 DIAGNOSIS — Z7189 Other specified counseling: Secondary | ICD-10-CM

## 2021-05-24 DIAGNOSIS — C5700 Malignant neoplasm of unspecified fallopian tube: Secondary | ICD-10-CM

## 2021-05-24 NOTE — Telephone Encounter (Signed)
Called Leslie Rivera and she is interested in further chemotherapy with carboplatin.  She would like to start after the first of the year if possible.

## 2021-05-24 NOTE — Telephone Encounter (Signed)
Called Jarah back and let her know the schedulers will be calling with appointments for the 1st week of January.

## 2021-05-24 NOTE — Progress Notes (Signed)
Gynecologic Oncology Telehealth Consult Note: Gyn-Onc  I connected with Leslie Rivera on 05/24/21 at  4:20 PM EST by telephone and verified that I am speaking with the correct person using two identifiers.  I discussed the limitations, risks, security and privacy concerns of performing an evaluation and management service by telemedicine and the availability of in-person appointments. I also discussed with the patient that there may be a patient responsible charge related to this service. The patient expressed understanding and agreed to proceed.  Other persons participating in the visit and their role in the encounter: sister.  Patient's location: home Provider's location: [name]  Reason for Visit: follow-up after surgery, discuss pathology  Treatment History: Oncology History Overview Note  Normal MMR   Primary peritoneal carcinomatosis ([name])  01/05/2021 Pathology Results   SURGICAL PATHOLOGY  CASE: [phone]  PATIENT: [name]  Surgical Pathology Report   Reason for Addendum #1:  Immunohistochemistry results  Reason for Addendum #2:  DNA Mismatch Repair IHC Results   Clinical History: history of colon cancer, now with omental thickening  (cm)   FINAL MICROSCOPIC DIAGNOSIS:   A. OMENTUM, NEEDLE CORE BIOPSY:  - Metastatic poorly differentiated adenocarcinoma.  See comment   COMMENT:   Immunohistochemical stains show that the tumor cells are positive for CK7 and negative for CDX2 and CK20.  This immunoprofile is nonspecific and differential diagnosis can include an upper gastrointestinal, breast and lung primary among other possibilities.  Only a very small fraction of primary colonic adenocarcinoma was immunoprofile.  Clinical and radiologic correlation suggested.   Additional immunohistochemical stains are performed and show that the tumor cells are positive for WT1, PAX8 and ER.  Immunostain for p53 shows a clonal overexpression pattern.  This immunoprofile is  consistent with a high-grade serous carcinoma of a gynecologic or primary peritoneal primary.  Clinical and radiologic correlation is suggested.    01/19/2021 Tumor Marker   Patient's tumor was tested for the following markers: CA-125. Results of the tumor marker test revealed 6790.   01/28/2021 PET scan   1. Examination is positive for extensive FDG avid peritoneal disease within the abdomen and pelvis including multiple implants upon the surface of liver and extensive omental caking. Moderate ascites noted within the pelvis. 2. FDG avid retroperitoneal, retrocrural, right internal mammary, and mediastinal lymph nodes compatible with metastatic adenopathy. 3. Small right pleural effusion. 4.  Aortic Atherosclerosis (ICD10-I70.0).     01/31/2021 Initial Diagnosis   Primary peritoneal carcinomatosis ([name])   01/31/2021 Cancer Staging   Staging form: Ovary, Fallopian Tube, and Primary Peritoneal Carcinoma, AJCC 8th Edition - Clinical stage from 01/31/2021: FIGO Stage IV (cT2b, cN1b, cM1) - Signed by Heath Lark, MD on 01/31/2021 Stage prefix: Initial diagnosis    02/14/2021 Tumor Marker   Patient's tumor was tested for the following markers: CA-125. Results of the tumor marker test revealed 8668.   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 Remainder of testing was negative/normal. The report date is 02/09/2021.  The CancerNext-Expanded + RNAinsight gene panel offered by [employer] and includes sequencing and rearrangement analysis for the following 77 genes: IP, ALK, APC*, ATM*, AXIN2, BAP1, BARD1, BLM, BMPR1A, BRCA1*, BRCA2*, BRIP1*, CDC73, CDH1*,CDK4, CDKN1B, CDKN2A, CHEK2*, CTNNA1, DICER1, FANCC, FH, FLCN, GALNT12, KIF1B, LZTR1, MAX, MEN1, MET, MLH1*, MSH2*, MSH3,  MSH6*, MUTYH*, NBN, NF1*, NF2,  NTHL1, PALB2*, PHOX2B, PMS2*, POT1, PRKAR1A, PTCH1, PTEN*, RAD51C*, RAD51D*,RB1, RECQL, RET, SDHA, SDHAF2, SDHB, SDHC, SDHD, SMAD4, SMARCA4, SMARCB1, SMARCE1, STK11, SUFU, TMEM127, TP53*,TSC1, TSC2, VHL and XRCC2 (sequencing and deletion/duplication); EGFR, EGLN1, HOXB13, KIT, MITF, PDGFRA, POLD1 and POLE (sequencing only); EPCAM and GREM1 (deletion/duplication only).   03/10/2021 Tumor Marker   Patient's tumor was tested for the following markers: CA-125. Results of the tumor marker test revealed 2002.   04/14/2021 Tumor Marker   Patient's tumor was tested for the following markers: CA-125. Results of the tumor marker test revealed 278.   04/26/2021 Imaging   Significant decrease in peritoneal carcinomatosis since previous study, with resolution of ascites.   Decreased anterior mediastinal and retrocrural lymphadenopathy.   No new or progressive metastatic disease within the chest, abdomen, or pelvis.   Aortic Atherosclerosis (ICD10-I70.0).   04/29/2021 Tumor Marker   Patient's tumor was tested for the following markers: CA-125. Results of the tumor marker test revealed 134.     Interval History: Doing well since surgery. Pain is improving daily - no longer needing oxycodone. Tolerating diet without nausea or emesis. Denies urinary symptoms. Having some looser stools, has had this issue when she's taken ibuprofen regularly in the past. She denies any vaginal bleeding or discharge.   Past Medical/Surgical History: Past Medical History:  Diagnosis Date   Abdominal pain    Adenomatous colon polyp    Anemia    Iron deficiency anemia   Anxiety    Cancer ([name])    colon   Colon cancer ([name]) 07/17/2013   Family history of breast cancer    Family history of colon cancer    Family history of melanoma    History of migraine headaches    Hypothyroidism    Internal hemorrhoids    Irregular heart beat    occ. 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[name])     Past  Surgical History:  Procedure Laterality Date   BREAST BIOPSY Right    BREAST SURGERY     removal abnormal cells in right   COLONOSCOPY     multiple   ESOPHAGOGASTRODUODENOSCOPY (EGD) WITH PROPOFOL N/A 12/21/2020   Procedure: ESOPHAGOGASTRODUODENOSCOPY (EGD) WITH PROPOFOL;  Surgeon: Carol Ada, MD;  Location: [facility] ENDOSCOPY;  Service: Endoscopy;  Laterality: N/A;   EUS N/A 12/21/2020   Procedure: UPPER ENDOSCOPIC ULTRASOUND (EUS) LINEAR;  Surgeon: Carol Ada, MD;  Location: [facility] ENDOSCOPY;  Service: Endoscopy;  Laterality: N/A;   LAPAROSCOPIC RIGHT COLECTOMY Right 07/17/2013   Procedure: LAPAROSCOPIC ASSISTED  RIGHT COLECTOMY;  Surgeon: Odis Hollingshead, MD;  Location: [facility] ORS;  Service: General;  Laterality: Right;   TONSILLECTOMY     teenager- age 58    Family History  Problem Relation Age of Onset   Cancer Brother        internal melanoma   Cancer Maternal Grandmother        colon   Ovarian cancer Neg Hx    Endometrial cancer Neg Hx    Pancreatic cancer Neg Hx    Prostate cancer Neg Hx     Social History   Socioeconomic History   Marital status: Divorced    Spouse name: Not on file   Number of children: Not on file   Years of education: Not on file   Highest education level: Not on file  Occupational History   Occupation: owner of [employer]  Tobacco Use   Smoking  status: Former    Types: Cigarettes    Quit date: 06/11/1997    Years since quitting: 23.9   Smokeless tobacco: Never  Vaping Use   Vaping Use: Never used  Substance and Sexual Activity   Alcohol use: Not Currently    Comment: 2 ounces daily - Scotch   Drug use: No   Sexual activity: Not Currently  Other Topics Concern   Not on file  Social History Narrative   Not on file   Social Determinants of Health   Financial Resource Strain: Not on file  Food Insecurity: Not on file  Transportation Needs: Not on file  Physical Activity: Not on file  Stress: Not on file  Social  Connections: Not on file    Current Medications:  Current Outpatient Medications:    Calcium Carb-Cholecalciferol (CALCIUM 600+D3 PO), Take 1 tablet by mouth in the morning and at bedtime., Disp: , Rfl:    cetirizine (ZYRTEC) 10 MG tablet, Take 10 mg by mouth every 30 (thirty) days. As needed, Disp: , Rfl:    chlorhexidine (PERIDEX) 0.12 % solution, SMARTSIG:1 Capful(s) By Mouth Twice Daily, Disp: , Rfl:    cholecalciferol (VITAMIN D3) 25 MCG (1000 UNIT) tablet, Take 1,000 Units by mouth in the morning., Disp: , Rfl:    diazepam (VALIUM) 5 MG tablet, Take 5 mg by mouth daily as needed for anxiety., Disp: , Rfl:    enoxaparin (LOVENOX) 40 MG/0.4ML injection, Inject 0.4 mLs (40 mg total) into the skin daily for 14 days., Disp: 5.6 mL, Rfl: 0   escitalopram (LEXAPRO) 10 MG tablet, Take 1.5 tablets (15 mg total) by mouth daily., Disp: 45 tablet, Rfl: 3   ferrous gluconate (FERGON) 324 MG tablet, Take 1 tablet (324 mg total) by mouth daily with breakfast., Disp: 30 tablet, Rfl: 3   ibuprofen (ADVIL) 600 MG tablet, Take 1 tablet (600 mg total) by mouth every 6 (six) hours as needed for moderate pain. For AFTER surgery only, Disp: 30 tablet, Rfl: 0   Lactobacillus (ACIDOPHILUS PO), Take 1 capsule by mouth in the morning., Disp: , Rfl:    levothyroxine (SYNTHROID, LEVOTHROID) 75 MCG tablet, Take 75 mcg by mouth daily before breakfast., Disp: , Rfl: 3   ondansetron (ZOFRAN) 8 MG tablet, Take 1 tablet (8 mg total) by mouth every 8 (eight) hours as needed for refractory nausea / vomiting. (Patient not taking: No sig reported), Disp: 30 tablet, Rfl: 1   oxyCODONE (OXY IR/ROXICODONE) 5 MG immediate release tablet, Take 1 tablet (5 mg total) by mouth every 4 (four) hours as needed for severe pain. For AFTER surgery only, do not take and drive, Disp: 10 tablet, Rfl: 0   oxyCODONE (OXY IR/ROXICODONE) 5 MG immediate release tablet, Take 1 tablet (5 mg total) by mouth every 4 (four) hours as needed for severe  pain., Disp: 10 tablet, Rfl: 0   oxyCODONE-acetaminophen (PERCOCET/ROXICET) 5-325 MG tablet, Take 1 tablet by mouth every 6 (six) hours as needed for severe pain., Disp: 20 tablet, Rfl: 0   Polyethyl Glycol-Propyl Glycol (SYSTANE) 0.4-0.3 % SOLN, Place 1-2 drops into both eyes 3 (three) times daily as needed (dry/irritated eyes.)., Disp: , Rfl:    prochlorperazine (COMPAZINE) 10 MG tablet, Take 1 tablet (10 mg total) by mouth every 6 (six) hours as needed (Nausea or vomiting). (Patient not taking: No sig reported), Disp: 30 tablet, Rfl: 1   senna-docusate (SENOKOT-S) 8.6-50 MG tablet, Take 2 tablets by mouth at bedtime. For AFTER surgery, do not take  if having diarrhea, Disp: 30 tablet, Rfl: 0   valACYclovir (VALTREX) 1000 MG tablet, Take 1,000 mg by mouth daily as needed (Fever Blister)., Disp: , Rfl:    zolpidem (AMBIEN) 10 MG tablet, TAKE 1 TABLET BY MOUTH AT BEDTIME AS NEEDED FOR SLEEP, Disp: 30 tablet, Rfl: 0  Review of Symptoms: Pertinent positives as above, includes fatigue.  Physical Exam: There were no vitals taken for this visit. Deferred given limitations of phone visit.   Laboratory & Radiologic Studies: A. UTERUS, CERVIX, BILATERAL FALLOPIAN TUBES AND OVARIES:  - Fallopian tubes:       - Bilateral fallopian tubes involved by high-grade serous  carcinoma.  - Ovaries:       - Bilateral ovaries involved by high-grade serous carcinoma.  - Uterine cervix:       - Benign transformation zone.       - Negative for squamous intraepithelial lesion and malignancy.  - Endometrium:       - Inactive endometrium.       - Negative for atypical hyperplasia/EIN and malignancy.  - Myometrium:       - Negative for malignancy.  - Uterine serosa:       - Involved by metastatic high-grade serous carcinoma.   B. PERITONEAL IMPLANT, EXCISION:  - Metastatic high-grade serous carcinoma.   C. OMENTUM, RESECTION:  - Metastatic high-grade serous carcinoma, multifocal.   Assessment &  Plan: Leslie Rivera is a 75 y.o. 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  The patient is meeting milestones and doing well after surgery. Discussed continued expectations and limitations. She is aware of visit with me in December.  Reviewed in detail pathology report with the patient and her sister as well as findings at the time of surgery. Given CT findings, we knew that R0 resection was not going to be possible with IDS at the time it was performed. Explained that given known persistent disease, that I continue to recommend adjuvant chemotherapy. The patient has decided that she will try an additional cycle of carboplatin alone. If tolerates better, will consider 2 additional cycles.   I discussed the assessment and treatment plan with the patient. The patient was provided with an opportunity to ask questions and all were answered. The patient agreed with the plan and demonstrated an understanding of the instructions.   The patient was advised to call back or see an in-person evaluation if the symptoms worsen or if the condition fails to improve as anticipated.   22 minutes of total time was spent for this patient encounter, including preparation, over the phone counseling with the patient and coordination of care, and documentation of the encounter.   Jeral Pinch, MD  Division of Gynecologic Oncology  Department of Obstetrics and Gynecology  [facility]

## 2021-05-24 NOTE — Telephone Encounter (Signed)
OK, I will put LOS for 1st week in Jan to see her back and resume carboplatin only

## 2021-06-02 ENCOUNTER — Telehealth: Payer: Self-pay

## 2021-06-02 NOTE — Telephone Encounter (Signed)
She called and left a message. Asking if it is okay to get the covid booster vaccine? In the past you had said not to get it while she was getting treatment.

## 2021-06-02 NOTE — Telephone Encounter (Signed)
Called and left message okay to get covid booster. Ask her call the office if needed.

## 2021-06-02 NOTE — Telephone Encounter (Signed)
She can have it

## 2021-06-06 ENCOUNTER — Encounter: Payer: Self-pay | Admitting: Gynecologic Oncology

## 2021-06-06 ENCOUNTER — Inpatient Hospital Stay: Payer: Medicare Other | Attending: Genetic Counselor | Admitting: Gynecologic Oncology

## 2021-06-06 ENCOUNTER — Other Ambulatory Visit: Payer: Self-pay | Admitting: Hematology and Oncology

## 2021-06-06 ENCOUNTER — Other Ambulatory Visit: Payer: Self-pay

## 2021-06-06 VITALS — BP 126/76 | HR 77 | Temp 97.8°F | Resp 16 | Ht 66.0 in | Wt 153.8 lb

## 2021-06-06 DIAGNOSIS — C5700 Malignant neoplasm of unspecified fallopian tube: Secondary | ICD-10-CM

## 2021-06-06 DIAGNOSIS — Z90722 Acquired absence of ovaries, bilateral: Secondary | ICD-10-CM

## 2021-06-06 DIAGNOSIS — Z90710 Acquired absence of both cervix and uterus: Secondary | ICD-10-CM

## 2021-06-06 DIAGNOSIS — Z7189 Other specified counseling: Secondary | ICD-10-CM

## 2021-06-06 NOTE — Patient Instructions (Signed)
You are healing well from surgery today.  Remember, no lifting, pushing, or pulling more than 10 pounds until 6 weeks after surgery.  Nothing in the vagina until at least 8 weeks.  I will see you after you finish chemotherapy with Dr. Alvy Bimler.

## 2021-06-06 NOTE — Progress Notes (Signed)
Gynecologic Oncology Return Clinic Visit  06/06/2021  Reason for Visit: Follow-up after surgery, treatment discussion  Treatment History: Oncology History Overview Note  Normal MMR   Primary peritoneal carcinomatosis ([place])  01/05/2021 Pathology Results   SURGICAL PATHOLOGY  CASE: [id number]  PATIENT: [name]  Surgical Pathology Report   Reason for Addendum #1:  Immunohistochemistry results  Reason for Addendum #2:  DNA Mismatch Repair IHC Results   Clinical History: history of colon cancer, now with omental thickening  (cm)   FINAL MICROSCOPIC DIAGNOSIS:   A. OMENTUM, NEEDLE CORE BIOPSY:  - Metastatic poorly differentiated adenocarcinoma.  See comment   COMMENT:   Immunohistochemical stains show that the tumor cells are positive for CK7 and negative for CDX2 and CK20.  This immunoprofile is nonspecific and differential diagnosis can include an upper gastrointestinal, breast and lung primary among other possibilities.  Only a very small fraction of primary colonic adenocarcinoma was immunoprofile.  Clinical and radiologic correlation suggested.   Additional immunohistochemical stains are performed and show that the tumor cells are positive for WT1, PAX8 and ER.  Immunostain for p53 shows a clonal overexpression pattern.  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  Clinical and radiologic correlation is suggested.    01/19/2021 Tumor Marker   Patient's tumor was tested for the following markers: CA-125. Results of the tumor marker test revealed 6790.   01/28/2021 PET scan   1. Examination is positive for extensive FDG avid peritoneal disease within the abdomen and pelvis including multiple implants upon the surface of liver and extensive omental caking. Moderate ascites noted within the pelvis. 2. FDG avid retroperitoneal, retrocrural, right internal mammary, and mediastinal lymph nodes compatible  with metastatic adenopathy. 3. Small right pleural effusion. 4.  Aortic Atherosclerosis (ICD10-I70.0).     01/31/2021 Initial Diagnosis   Primary peritoneal carcinomatosis ([place])   01/31/2021 Cancer Staging   Staging form: Ovary, Fallopian Tube, and Primary Peritoneal Carcinoma, AJCC 8th Edition - Clinical stage from 01/31/2021: FIGO Stage IV (cT2b, cN1b, cM1) - Signed by Heath Lark, MD on 01/31/2021 Stage prefix: Initial diagnosis    02/14/2021 Tumor Marker   Patient's tumor was tested for the following markers: CA-125. Results of the tumor marker test revealed 8668.   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 Remainder of testing was negative/normal. The report date is 02/09/2021.  The CancerNext-Expanded + RNAinsight gene panel offered by [employer] and includes sequencing and rearrangement analysis for the following 77 genes: IP, ALK, APC*, ATM*, AXIN2, BAP1, BARD1, BLM, BMPR1A, BRCA1*, BRCA2*, BRIP1*, CDC73, CDH1*,CDK4, CDKN1B, CDKN2A, CHEK2*, CTNNA1, DICER1, FANCC, FH, FLCN, GALNT12, KIF1B, LZTR1, MAX, MEN1, MET, MLH1*, MSH2*, MSH3, MSH6*, MUTYH*, NBN, NF1*, NF2, NTHL1, PALB2*, PHOX2B, PMS2*, POT1, PRKAR1A, PTCH1, PTEN*, RAD51C*, RAD51D*,RB1, RECQL, RET, SDHA, SDHAF2, SDHB, SDHC, SDHD, SMAD4, SMARCA4, SMARCB1, SMARCE1, STK11, SUFU, TMEM127, TP53*,TSC1, TSC2, VHL and XRCC2 (sequencing and deletion/duplication); EGFR, EGLN1, HOXB13, KIT, MITF, PDGFRA, POLD1 and POLE (sequencing only); EPCAM and GREM1 (deletion/duplication only).   03/10/2021 Tumor Marker   Patient's tumor was tested for the following markers: CA-125. Results of the tumor marker test revealed 2002.   04/14/2021 Tumor Marker   Patient's tumor was tested for the following markers: CA-125. Results of the tumor marker test revealed 278.   04/26/2021 Imaging  Significant decrease in peritoneal carcinomatosis since previous study, with resolution of ascites.   Decreased anterior mediastinal and retrocrural lymphadenopathy.   No new or progressive metastatic disease within the chest, abdomen, or pelvis.   Aortic Atherosclerosis (ICD10-I70.0).   04/29/2021 Tumor Marker   Patient's tumor was tested for the following markers: CA-125. Results of the tumor marker test revealed 134.   05/12/2021 Surgery   Tumor debulking including robotic-assisted laparoscopic total hysterectomy with bilateral salpingoophorectomy, lysis of adhesions, excision and fulguration of peritoneal nodules, mini laparotomy for intra-abdominal palpation and omentectomy  Findings: : On exam under anesthesia, small mobile uterus.  On intra-abdominal entry, normal upper abdominal survey including liver and stomach.  Right diaphragm with small peritoneal studding, all lesions less than 5 mm.  Omentum with an approximately 5 cm mass adherent to the lower anterior abdominal wall and bladder peritoneum.  Normal 6 cm uterus.  Somewhat atrophic appearing bilateral adnexa with tumor implants.  Sigmoid and rectal colon and mesentery with moderate peritoneal disease, all lesions less than 5 mm.  Multiple peritoneal implants on bilateral pelvic sidewalls and in the posterior cul-de-sac, all measuring less than 5 mm.  Multiple omental implants measuring several mm.  Approximately 1 cm area within the transverse colon mesentery, unclear if this is related to scar tissue from prior colon resection versus tumor implant.  No obvious pelvic adenopathy, no palpable para-aortic adenopathy. R1 resection at the end of surgery   05/12/2021 Pathology Results   A. UTERUS, CERVIX, BILATERAL FALLOPIAN TUBES AND OVARIES:  - Fallopian tubes:       - Bilateral fallopian tubes involved by high-grade serous  carcinoma.  - Ovaries:       - Bilateral ovaries involved by high-grade serous carcinoma.  - Uterine cervix:        - Benign transformation zone.       - Negative for squamous intraepithelial lesion and malignancy.  - Endometrium:       - Inactive endometrium.       - Negative for atypical hyperplasia/EIN and malignancy.  - Myometrium:       - Negative for malignancy.  - Uterine serosa:       - Involved by metastatic high-grade serous carcinoma.   B. PERITONEAL IMPLANT, EXCISION:  - Metastatic high-grade serous carcinoma.   C. OMENTUM, RESECTION:  - Metastatic high-grade serous carcinoma, multifocal.   COMMENT:   A. Immunohistochemical studies show tumor cells to be positive for CK7,  Pax-8, and WT-1, and negative for CK20, PR, and CDX-2. These findings  support the above diagnosis. A p53 shows strong and diffuse staining  within neoplastic cells, indicating mutated p53 status.   OVARY or FALLOPIAN TUBE or PRIMARY PERITONEUM: Resection   Procedure: Total hysterectomy and bilateral salpingo-oophorectomy  Specimen Integrity: Intact  Tumor Site: Favor right fallopian tube  Tumor Size: At least 1.7 cm  Histologic Type: High-grade serous carcinoma  Histologic Grade: High-grade  Ovarian Surface Involvement: Present, right and left  Fallopian Tube Surface Involvement: Present, right and left  Implants (required for advanced stage serous/seromucinous borderline  tumors only): Present: Peritoneal and omental  Other Tissue/ Organ Involvement: Bilateral ovaries, uterine serosa,  omentum, pelvic peritoneum  Largest Extrapelvic Peritoneal Focus: At least 6.7 cm  Peritoneal/Ascitic Fluid Involvement: Not submitted/unknown  Chemotherapy Response Score (CRS): CRS 1 (no definite or minimal  response)  Regional Lymph Nodes: Not applicable (no lymph nodes submitted or found)   Distant Metastasis:       Distant Site(s) Involved: Not  applicable  Pathologic Stage Classification (pTNM, AJCC 8th Edition): ypT3c, pN not  assigned  TNM classifiers: y (posttreatment)  Ancillary Studies: Can be  performed upon request  Representative Tumor Block: A4  Comment(s): None      Interval History: Presents for follow-up after surgery.  Doing well.  Endorses minimal postoperative pain.  Tolerating p.o. intake without nausea or emesis.  Has some diarrhea associated with medications when she takes them, otherwise reports bowels are functioning normally.  Continues to have urinary incontinence (stress), may be slightly worse after surgery than before.  She has previously had pelvic PT for this.  Denies any vaginal bleeding or discharge.  Past Medical/Surgical History: Past Medical History:  Diagnosis Date   Abdominal pain    Adenomatous colon polyp    Anemia    Iron deficiency anemia   Anxiety    Cancer ([place])    colon   Colon cancer ([place]) 07/17/2013   Family history of breast cancer    Family history of colon cancer    Family history of melanoma    History of migraine headaches    Hypothyroidism    Internal hemorrhoids    Irregular heart beat    occ. 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[facility])     Past Surgical History:  Procedure Laterality Date   BREAST BIOPSY Right    BREAST SURGERY     removal abnormal cells in right   COLONOSCOPY     multiple   ESOPHAGOGASTRODUODENOSCOPY (EGD) WITH PROPOFOL N/A 12/21/2020   Procedure: ESOPHAGOGASTRODUODENOSCOPY (EGD) WITH PROPOFOL;  Surgeon: Carol Ada, MD;  Location: [facility] END[facility]COPY;  Service: Endoscopy;  Laterality: N/A;   EUS N/A 12/21/2020   Procedure: UPPER ENDOSCOPIC ULTRASOUND (EUS) LINEAR;  Surgeon: Carol Ada, MD;  Location: [facility] ENDOSCOPY;  Service: Endoscopy;  Laterality: N/A;   LAPAROSCOPIC RIGHT COLECTOMY Right 07/17/2013   Procedure: LAPAROSCOPIC ASSISTED  RIGHT COLECTOMY;  Surgeon: Odis Hollingshead, MD;  Location: [facility] ORS;  Service: General;  Laterality: Right;   TONSILLECTOMY     teenager- age 52    Family History  Problem Relation Age of Onset   Cancer Brother        internal melanoma    Cancer Maternal Grandmother        colon   Ovarian cancer Neg Hx    Endometrial cancer Neg Hx    Pancreatic cancer Neg Hx    Prostate cancer Neg Hx     Social History   Socioeconomic History   Marital status: Divorced    Spouse name: Not on file   Number of children: Not on file   Years of education: Not on file   Highest education level: Not on file  Occupational History   Occupation: owner of [employer]  Tobacco Use   Smoking status: Former    Types: Cigarettes    Quit date: 06/11/1997    Years since quitting: 24.0   Smokeless tobacco: Never  Vaping Use   Vaping Use: Never used  Substance and Sexual Activity   Alcohol use: Not Currently    Comment: 2 ounces daily - Scotch   Drug use: No   Sexual activity: Not Currently  Other Topics Concern   Not on file  Social History Narrative   Not on file   Social Determinants of Health   Financial Resource Strain: Not on file  Food Insecurity: Not on file  Transportation Needs: Not on file  Physical  Activity: Not on file  Stress: Not on file  Social Connections: Not on file    Current Medications:  Current Outpatient Medications:    Calcium Carb-Cholecalciferol (CALCIUM 600+D3 PO), Take 1 tablet by mouth in the morning and at bedtime., Disp: , Rfl:    cetirizine (ZYRTEC) 10 MG tablet, Take 10 mg by mouth every 30 (thirty) days. As needed, Disp: , Rfl:    cholecalciferol (VITAMIN D3) 25 MCG (1000 UNIT) tablet, Take 1,000 Units by mouth in the morning., Disp: , Rfl:    diazepam (VALIUM) 5 MG tablet, Take 5 mg by mouth daily as needed for anxiety., Disp: , Rfl:    escitalopram (LEXAPRO) 10 MG tablet, Take 1.5 tablets (15 mg total) by mouth daily., Disp: 45 tablet, Rfl: 3   ferrous gluconate (FERGON) 324 MG tablet, Take 1 tablet (324 mg total) by mouth daily with breakfast., Disp: 30 tablet, Rfl: 3   ibuprofen (ADVIL) 600 MG tablet, Take 1 tablet (600 mg total) by mouth every 6 (six) hours as needed for moderate  pain. For AFTER surgery only, Disp: 30 tablet, Rfl: 0   levothyroxine (SYNTHROID, LEVOTHROID) 75 MCG tablet, Take 75 mcg by mouth daily before breakfast., Disp: , Rfl: 3   oxyCODONE (OXY IR/ROXICODONE) 5 MG immediate release tablet, Take 1 tablet (5 mg total) by mouth every 4 (four) hours as needed for severe pain. For AFTER surgery only, do not take and drive, Disp: 10 tablet, Rfl: 0   oxyCODONE (OXY IR/ROXICODONE) 5 MG immediate release tablet, Take 1 tablet (5 mg total) by mouth every 4 (four) hours as needed for severe pain., Disp: 10 tablet, Rfl: 0   oxyCODONE-acetaminophen (PERCOCET/ROXICET) 5-325 MG tablet, Take 1 tablet by mouth every 6 (six) hours as needed for severe pain., Disp: 20 tablet, Rfl: 0   Polyethyl Glycol-Propyl Glycol (SYSTANE) 0.4-0.3 % SOLN, Place 1-2 drops into both eyes 3 (three) times daily as needed (dry/irritated eyes.)., Disp: , Rfl:    valACYclovir (VALTREX) 1000 MG tablet, Take 1,000 mg by mouth daily as needed (Fever Blister)., Disp: , Rfl:    zolpidem (AMBIEN) 10 MG tablet, TAKE 1 TABLET BY MOUTH AT BEDTIME AS NEEDED FOR SLEEP, Disp: 30 tablet, Rfl: 0   chlorhexidine (PERIDEX) 0.12 % solution, SMARTSIG:1 Capful(s) By Mouth Twice Daily (Patient not taking: Reported on 06/06/2021), Disp: , Rfl:    enoxaparin (LOVENOX) 40 MG/0.4ML injection, Inject 0.4 mLs (40 mg total) into the skin daily for 14 days., Disp: 5.6 mL, Rfl: 0   Lactobacillus (ACIDOPHILUS PO), Take 1 capsule by mouth in the morning. (Patient not taking: Reported on 06/06/2021), Disp: , Rfl:    ondansetron (ZOFRAN) 8 MG tablet, Take 1 tablet (8 mg total) by mouth every 8 (eight) hours as needed for refractory nausea / vomiting. (Patient not taking: Reported on 04/28/2021), Disp: 30 tablet, Rfl: 1   prochlorperazine (COMPAZINE) 10 MG tablet, Take 1 tablet (10 mg total) by mouth every 6 (six) hours as needed (Nausea or vomiting). (Patient not taking: Reported on 04/28/2021), Disp: 30 tablet, Rfl: 1    senna-docusate (SENOKOT-S) 8.6-50 MG tablet, Take 2 tablets by mouth at bedtime. For AFTER surgery, do not take if having diarrhea (Patient not taking: Reported on 06/06/2021), Disp: 30 tablet, Rfl: 0  Review of Systems: Pertinent positives include some diarrhea, urinary frequency, urinary incontinence, minor hot flashes, neuropathy in hands. Denies appetite changes, fevers, chills, fatigue, unexplained weight changes. Denies hearing loss, neck lumps or masses, mouth sores, ringing in ears or  voice changes. Denies cough or wheezing.  Denies shortness of breath. Denies chest pain or palpitations. Denies leg swelling. Denies abdominal distention, pain, blood in stools, constipation, nausea, vomiting, or early satiety. Denies pain with intercourse, dysuria. Denies pelvic pain, vaginal bleeding or vaginal discharge.   Denies joint pain, back pain or muscle pain/cramps. Denies itching, rash, or wounds. Denies dizziness, headaches, or seizures. Denies swollen lymph nodes or glands, denies easy bruising or bleeding. Denies anxiety, depression, confusion, or decreased concentration.  Physical Exam: BP 126/76 (BP Location: Left Arm, Patient Position: Sitting)   Pulse 77   Temp 97.8 F (36.6 C) (Oral)   Resp 16   Ht $R'5\' 6"'KA$  (1.676 m)   Wt 153 lb 12.8 oz (69.8 kg)   SpO2 99%   BMI 24.82 kg/m  General: Alert, oriented, no acute distress. HEENT: Normocephalic, atraumatic, sclera anicteric. Chest: Clear to auscultation bilaterally.  No wheezes or rhonchi. Cardiovascular: Regular rate and rhythm, no murmurs. Abdomen: soft, nontender.  Normoactive bowel sounds.  No masses or hepatosplenomegaly appreciated.  Well-healed incisions, suture excised from left lateral incision. Extremities: Grossly normal range of motion.  Warm, well perfused.  No edema bilaterally. Skin: No rashes or lesions noted. GU: Normal appearing external genitalia without erythema, excoriation, or lesions.   On speculum exam,  some anterior prolapse noted.  Vagina is mildly atrophic.  Cuff intact, suture visible.  No bleeding or discharge.  On bimanual exam, cuff intact, no tenderness or fluctuation.  Laboratory & Radiologic Studies: None new  Assessment & Plan: Leslie Rivera is a 75 y.o. woman with advanced stage fallopian tube cancer status post interval debulking surgery after 3 cycles of neoadjuvant chemotherapy.  Doing very well from a postoperative standpoint.  Recovering well.  Discussed continued postoperative restrictions as well as expectations.  Reviewed pathology results again from recent surgery.  Given persistent carcinoma on pathology, strongly agree with continued adjuvant treatment.  Patient is amenable to continuing chemotherapy with carboplatin alone.  She would like to do 1 cycle and see how she does.  If no significant side effects, she would be amenable to an additional 2 cycles.  I will see her back for follow-up after she has completed adjuvant treatment.  Patient has previously had genetic testing.  I recommended somatic testing, which was requested through our genetic counselors today.  She and I had previously discussed that both certain mutations on germline testing as well as somatic testing may mean that she would be a candidate for certain treatment (PARPi).  Encouraged her to call [facility] regarding her stress urinary incontinence.  She already established with them and was doing pelvic floor PT.  I am recommending that when she is healed from surgery, she would be a good candidate for a pessary.  32 minutes of total time was spent for this patient encounter, including preparation, face-to-face counseling with the patient and coordination of care, and documentation of the encounter.  Jeral Pinch, MD  Division of Gynecologic Oncology  Department of Obstetrics and Gynecology  [facility]

## 2021-06-07 ENCOUNTER — Telehealth: Payer: Self-pay | Admitting: Hematology and Oncology

## 2021-06-07 NOTE — Telephone Encounter (Signed)
Scheduled per sch msg. Called and left msg. Mailed printout  

## 2021-06-13 ENCOUNTER — Other Ambulatory Visit: Payer: Self-pay | Admitting: Hematology and Oncology

## 2021-06-24 ENCOUNTER — Telehealth: Payer: Self-pay | Admitting: Licensed Clinical Social Worker

## 2021-06-24 NOTE — Telephone Encounter (Signed)
Discussed positive HRD status from [facility] tumor testing. Report will be uploaded to molecular pathology.

## 2021-06-26 ENCOUNTER — Encounter: Payer: Self-pay | Admitting: Hematology and Oncology

## 2021-06-29 ENCOUNTER — Encounter: Payer: Self-pay | Admitting: Hematology and Oncology

## 2021-06-29 MED FILL — Fosaprepitant Dimeglumine For IV Infusion 150 MG (Base Eq): INTRAVENOUS | Qty: 5 | Status: AC

## 2021-06-29 MED FILL — Dexamethasone Sodium Phosphate Inj 100 MG/10ML: INTRAMUSCULAR | Qty: 1 | Status: AC

## 2021-06-30 ENCOUNTER — Inpatient Hospital Stay: Payer: Medicare Other

## 2021-06-30 ENCOUNTER — Encounter: Payer: Self-pay | Admitting: Hematology and Oncology

## 2021-06-30 ENCOUNTER — Other Ambulatory Visit: Payer: Self-pay | Admitting: Hematology and Oncology

## 2021-06-30 ENCOUNTER — Other Ambulatory Visit: Payer: Self-pay

## 2021-06-30 ENCOUNTER — Encounter (HOSPITAL_COMMUNITY): Payer: Self-pay | Admitting: Hematology and Oncology

## 2021-06-30 ENCOUNTER — Inpatient Hospital Stay: Payer: Medicare Other | Attending: Genetic Counselor | Admitting: Hematology and Oncology

## 2021-06-30 DIAGNOSIS — Z1379 Encounter for other screening for genetic and chromosomal anomalies: Secondary | ICD-10-CM

## 2021-06-30 DIAGNOSIS — J90 Pleural effusion, not elsewhere classified: Secondary | ICD-10-CM | POA: Insufficient documentation

## 2021-06-30 DIAGNOSIS — C786 Secondary malignant neoplasm of retroperitoneum and peritoneum: Secondary | ICD-10-CM | POA: Insufficient documentation

## 2021-06-30 DIAGNOSIS — Z5111 Encounter for antineoplastic chemotherapy: Secondary | ICD-10-CM | POA: Diagnosis not present

## 2021-06-30 DIAGNOSIS — C5701 Malignant neoplasm of right fallopian tube: Secondary | ICD-10-CM

## 2021-06-30 DIAGNOSIS — C482 Malignant neoplasm of peritoneum, unspecified: Secondary | ICD-10-CM

## 2021-06-30 DIAGNOSIS — R590 Localized enlarged lymph nodes: Secondary | ICD-10-CM | POA: Insufficient documentation

## 2021-06-30 DIAGNOSIS — Z79899 Other long term (current) drug therapy: Secondary | ICD-10-CM | POA: Diagnosis not present

## 2021-06-30 LAB — CMP (CANCER CENTER ONLY)
ALT: 17 U/L (ref 0–44)
AST: 21 U/L (ref 15–41)
Albumin: 4.1 g/dL (ref 3.5–5.0)
Alkaline Phosphatase: 68 U/L (ref 38–126)
Anion gap: 6 (ref 5–15)
BUN: 22 mg/dL (ref 8–23)
CO2: 26 mmol/L (ref 22–32)
Calcium: 9.9 mg/dL (ref 8.9–10.3)
Chloride: 107 mmol/L (ref 98–111)
Creatinine: 0.71 mg/dL (ref 0.44–1.00)
GFR, Estimated: >60 mL/min (ref >60)
Glucose, Bld: 73 mg/dL (ref 70–99)
Potassium: 4.7 mmol/L (ref 3.5–5.1)
Sodium: 139 mmol/L (ref 135–145)
Total Bilirubin: 0.5 mg/dL (ref 0.3–1.2)
Total Protein: 7.7 g/dL (ref 6.5–8.1)

## 2021-06-30 LAB — CBC WITH DIFFERENTIAL (CANCER CENTER ONLY)
Abs Immature Granulocytes: 0.02 10*3/uL (ref 0.00–0.07)
Basophils Absolute: 0 10*3/uL (ref 0.0–0.1)
Basophils Relative: 1 %
Eosinophils Absolute: 0.1 10*3/uL (ref 0.0–0.5)
Eosinophils Relative: 2 %
HCT: 38.5 % (ref 36.0–46.0)
Hemoglobin: 12.5 g/dL (ref 12.0–15.0)
Immature Granulocytes: 0 %
Lymphocytes Relative: 40 %
Lymphs Abs: 2.4 10*3/uL (ref 0.7–4.0)
MCH: 33.7 pg (ref 26.0–34.0)
MCHC: 32.5 g/dL (ref 30.0–36.0)
MCV: 103.8 fL — ABNORMAL HIGH (ref 80.0–100.0)
Monocytes Absolute: 0.5 10*3/uL (ref 0.1–1.0)
Monocytes Relative: 8 %
Neutro Abs: 3 10*3/uL (ref 1.7–7.7)
Neutrophils Relative %: 49 %
Platelet Count: 297 10*3/uL (ref 150–400)
RBC: 3.71 MIL/uL — ABNORMAL LOW (ref 3.87–5.11)
RDW: 11.9 % (ref 11.5–15.5)
WBC Count: 5.9 10*3/uL (ref 4.0–10.5)
nRBC: 0 % (ref 0.0–0.2)

## 2021-06-30 MED ORDER — SODIUM CHLORIDE 0.9 % IV SOLN
481.8000 mg | Freq: Once | INTRAVENOUS | Status: AC
  Administered 2021-06-30: 480 mg via INTRAVENOUS
  Filled 2021-06-30: qty 48

## 2021-06-30 MED ORDER — SODIUM CHLORIDE 0.9 % IV SOLN: Freq: Once | INTRAVENOUS | Status: AC

## 2021-06-30 MED ORDER — PALONOSETRON HCL INJECTION 0.25 MG/5ML
0.2500 mg | Freq: Once | INTRAVENOUS | Status: AC
  Administered 2021-06-30: 0.25 mg via INTRAVENOUS
  Filled 2021-06-30: qty 5

## 2021-06-30 MED ORDER — PALONOSETRON HCL INJECTION 0.25 MG/5ML: 0.2500 mg | Freq: Once | INTRAVENOUS | Status: DC

## 2021-06-30 MED ORDER — SODIUM CHLORIDE 0.9 % IV SOLN
150.0000 mg | Freq: Once | INTRAVENOUS | Status: AC
  Administered 2021-06-30: 150 mg via INTRAVENOUS
  Filled 2021-06-30: qty 150

## 2021-06-30 MED ORDER — SODIUM CHLORIDE 0.9 % IV SOLN
10.0000 mg | Freq: Once | INTRAVENOUS | Status: AC
  Administered 2021-06-30: 10 mg via INTRAVENOUS
  Filled 2021-06-30: qty 10

## 2021-06-30 NOTE — Assessment & Plan Note (Signed)
I have reviewed the final pathology report with the patient and her sister The origin of the cancer is thought to be from the right fallopian tube She had excellent neoadjuvant chemotherapy response We had numerous discussions in the past about the role of further chemotherapy after surgery We will proceed with single agent carboplatin for 3 cycles Due to recent test results, she would qualify for additional PARP inhibitor in the future after completion of chemotherapy I have also reviewed her germline mutation study We discussed the implication of testing positive for the mutation and family members She has no further questions or concerns I will see her again in 3 weeks for further follow-up

## 2021-06-30 NOTE — Assessment & Plan Note (Signed)
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

## 2021-06-30 NOTE — Progress Notes (Signed)
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[facility]) I have reviewed the final pathology report with the patient and her sister The origin of the cancer is thought to be from the right fallopian tube She had excellent neoadjuvant chemotherapy response We had numerous discussions in the past about the role of further chemotherapy after surgery We will proceed with single agent carboplatin for 3 cycles Due to recent test results, she would qualify for additional PARP inhibitor in the future after completion of chemotherapy I have also reviewed her germline mutation study We discussed the implication of testing positive for the mutation and family members She has no further questions or concerns I will see her again in 3 weeks for further follow-up  Genetic testing 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   All questions were answered. The patient knows to call the clinic with any problems, questions or concerns. The total time spent in the appointment was 40 minutes encounter with patients including review of chart and various tests results, discussions about plan of care and coordination of care plan   Heath Lark, MD 06/30/2021 2:36 PM  INTERVAL HISTORY: Please see below for problem oriented charting. she returns for treatment follow-up seen to resume adjuvant treatment after surgery She is here with her sister We reviewed final pathology report,  recent positive HRD testing and her genetic test results She is recovering very well after surgery  REVIEW OF SYSTEMS:   Constitutional: Denies fevers, chills or abnormal weight loss Eyes: Denies blurriness of vision Ears, nose, mouth, throat, and face: Denies mucositis or sore throat Respiratory: Denies cough, dyspnea or wheezes Cardiovascular: Denies palpitation, chest discomfort or lower extremity swelling Gastrointestinal:  Denies nausea, heartburn or change in bowel habits Skin: Denies abnormal skin rashes Lymphatics: Denies new lymphadenopathy or easy bruising Neurological:Denies numbness, tingling or new weaknesses Behavioral/Psych: Mood is stable, no new changes  All other systems were reviewed with the patient and are negative.  I have reviewed the past medical history, past surgical history, social history and family history with the patient and they are unchanged from previous note.  ALLERGIES:  is allergic to barium-containing compounds, hydrocodone, erythromycin, and zofran [ondansetron].  MEDICATIONS:  Current Outpatient Medications  Medication Sig Dispense Refill   Calcium Carb-Cholecalciferol (CALCIUM 600+D3 PO) Take 1 tablet by mouth in the morning and at bedtime.     cetirizine (ZYRTEC) 10 MG tablet Take 10 mg by mouth every 30 (thirty) days. As needed     cholecalciferol (VITAMIN D3) 25 MCG (1000 UNIT) tablet Take 1,000 Units by mouth in the morning.     diazepam (VALIUM) 5 MG tablet Take 5 mg by mouth daily as needed for anxiety.     escitalopram (LEXAPRO) 10 MG tablet Take 1.5 tablets (15 mg total) by mouth daily. 45 tablet 3   levothyroxine (SYNTHROID, LEVOTHROID) 75 MCG tablet Take 75 mcg by mouth daily before breakfast.  3   ondansetron (ZOFRAN) 8 MG tablet Take 1 tablet (8 mg total) by mouth every 8 (  eight) hours as needed for refractory nausea / vomiting. (Patient not taking: Reported on 04/28/2021) 30 tablet 1   oxyCODONE (OXY IR/ROXICODONE) 5 MG immediate  release tablet Take 1 tablet (5 mg total) by mouth every 4 (four) hours as needed for severe pain. 10 tablet 0   Polyethyl Glycol-Propyl Glycol (SYSTANE) 0.4-0.3 % SOLN Place 1-2 drops into both eyes 3 (three) times daily as needed (dry/irritated eyes.).     prochlorperazine (COMPAZINE) 10 MG tablet Take 1 tablet (10 mg total) by mouth every 6 (six) hours as needed (Nausea or vomiting). (Patient not taking: Reported on 04/28/2021) 30 tablet 1   valACYclovir (VALTREX) 1000 MG tablet Take 1,000 mg by mouth daily as needed (Fever Blister).     zolpidem (AMBIEN) 10 MG tablet TAKE 1 TABLET BY MOUTH AT BEDTIME AS NEEDED FOR SLEEP 30 tablet 0   No current facility-administered medications for this visit.    SUMMARY OF ONCOLOGIC HISTORY: Oncology History Overview Note  Normal MMR   Cancer of right fallopian tube ([place])  01/05/2021 Pathology Results   SURGICAL PATHOLOGY  CASE: [id number]  PATIENT: [facility]  Surgical Pathology Report   Reason for Addendum #1:  Immunohistochemistry results  Reason for Addendum #2:  DNA Mismatch Repair IHC Results   Clinical History: history of colon cancer, now with omental thickening  (cm)   FINAL MICROSCOPIC DIAGNOSIS:   A. OMENTUM, NEEDLE CORE BIOPSY:  - Metastatic poorly differentiated adenocarcinoma.  See comment   COMMENT:   Immunohistochemical stains show that the tumor cells are positive for CK7 and negative for CDX2 and CK20.  This immunoprofile is nonspecific and differential diagnosis can include an upper gastrointestinal, breast and lung primary among other possibilities.  Only a very small fraction of primary colonic adenocarcinoma was immunoprofile.  Clinical and radiologic correlation suggested.   Additional immunohistochemical stains are performed and show that the tumor cells are positive for WT1, PAX8 and ER.  Immunostain for p53 shows a clonal overexpression pattern.  This immunoprofile is consistent with a high-grade serous carcinoma  of a gynecologic or primary peritoneal primary.  Clinical and radiologic correlation is suggested.    01/19/2021 Tumor Marker   Patient's tumor was tested for the following markers: CA-125. Results of the tumor marker test revealed [id number].   01/28/2021 PET scan   1. Examination is positive for extensive FDG avid peritoneal disease within the abdomen and pelvis including multiple implants upon the surface of liver and extensive omental caking. Moderate ascites noted within the pelvis. 2. FDG avid retroperitoneal, retrocrural, right internal mammary, and mediastinal lymph nodes compatible with metastatic adenopathy. 3. Small right pleural effusion. 4.  Aortic Atherosclerosis (ICD10-I70.0).     01/31/2021 Initial Diagnosis   Primary peritoneal carcinomatosis ([place])   01/31/2021 Cancer Staging   Staging form: Ovary, Fallopian Tube, and Primary Peritoneal Carcinoma, AJCC 8th Edition - Clinical stage from 01/31/2021: FIGO Stage IV (cT2b, cN1b, cM1) - Signed by Heath Lark, MD on 01/31/2021 Stage prefix: Initial diagnosis    02/14/2021 Tumor Marker   Patient's tumor was tested for the following markers: CA-125. Results of the tumor marker test revealed 8668.   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 Remainder of testing was negative/normal. The report date is 02/09/2021.  The CancerNext-Expanded + RNAinsight gene panel offered by [facility] and includes sequencing and  rearrangement analysis for the following 77 genes: IP, ALK, APC*, ATM*, AXIN2, BAP1, BARD1, BLM, BMPR1A, BRCA1*, BRCA2*, BRIP1*, CDC73, CDH1*,CDK4, CDKN1B, CDKN2A, CHEK2*, CTNNA1, DICER1, FANCC, FH, FLCN, GALNT12, KIF1B, LZTR1, MAX, MEN1, MET, MLH1*, MSH2*, MSH3, MSH6*, MUTYH*, NBN, NF1*, NF2, NTHL1, PALB2*, PHOX2B, PMS2*, POT1, PRKAR1A, PTCH1, PTEN*, RAD51C*, RAD51D*,RB1, RECQL,  RET, SDHA, SDHAF2, SDHB, SDHC, SDHD, SMAD4, SMARCA4, SMARCB1, SMARCE1, STK11, SUFU, TMEM127, TP53*,TSC1, TSC2, VHL and XRCC2 (sequencing and deletion/duplication); EGFR, EGLN1, HOXB13, KIT, MITF, PDGFRA, POLD1 and POLE (sequencing only); EPCAM and GREM1 (deletion/duplication only).   03/10/2021 Tumor Marker   Patient's tumor was tested for the following markers: CA-125. Results of the tumor marker test revealed 2002.   04/14/2021 Tumor Marker   Patient's tumor was tested for the following markers: CA-125. Results of the tumor marker test revealed 278.   04/26/2021 Imaging   Significant decrease in peritoneal carcinomatosis since previous study, with resolution of ascites.   Decreased anterior mediastinal and retrocrural lymphadenopathy.   No new or progressive metastatic disease within the chest, abdomen, or pelvis.   Aortic Atherosclerosis (ICD10-I70.0).   04/29/2021 Tumor Marker   Patient's tumor was tested for the following markers: CA-125. Results of the tumor marker test revealed 134.   05/12/2021 Surgery   Tumor debulking including robotic-assisted laparoscopic total hysterectomy with bilateral salpingoophorectomy, lysis of adhesions, excision and fulguration of peritoneal nodules, mini laparotomy for intra-abdominal palpation and omentectomy  Findings: : On exam under anesthesia, small mobile uterus.  On intra-abdominal entry, normal upper abdominal survey including liver and stomach.  Right diaphragm with small peritoneal studding, all lesions less than 5 mm.  Omentum with an approximately 5 cm mass adherent to the lower anterior abdominal wall and bladder peritoneum.  Normal 6 cm uterus.  Somewhat atrophic appearing bilateral adnexa with tumor implants.  Sigmoid and rectal colon and mesentery with moderate peritoneal disease, all lesions less than 5 mm.  Multiple peritoneal implants on bilateral pelvic sidewalls and in the posterior cul-de-sac, all measuring less than 5 mm.  Multiple  omental implants measuring several mm.  Approximately 1 cm area within the transverse colon mesentery, unclear if this is related to scar tissue from prior colon resection versus tumor implant.  No obvious pelvic adenopathy, no palpable para-aortic adenopathy. R1 resection at the end of surgery   05/12/2021 Pathology Results   A. UTERUS, CERVIX, BILATERAL FALLOPIAN TUBES AND OVARIES:  - Fallopian tubes:       - Bilateral fallopian tubes involved by high-grade serous  carcinoma.  - Ovaries:       - Bilateral ovaries involved by high-grade serous carcinoma.  - Uterine cervix:       - Benign transformation zone.       - Negative for squamous intraepithelial lesion and malignancy.  - Endometrium:       - Inactive endometrium.       - Negative for atypical hyperplasia/EIN and malignancy.  - Myometrium:       - Negative for malignancy.  - Uterine serosa:       - Involved by metastatic high-grade serous carcinoma.   B. PERITONEAL IMPLANT, EXCISION:  - Metastatic high-grade serous carcinoma.   C. OMENTUM, RESECTION:  - Metastatic high-grade serous carcinoma, multifocal.   COMMENT:   A. Immunohistochemical studies show tumor cells to be positive for CK7,  Pax-8, and WT-1, and negative for CK20, PR, and CDX-2. These findings  support the above diagnosis. A p53 shows strong and diffuse staining  within neoplastic cells, indicating mutated p53  status.   OVARY or FALLOPIAN TUBE or PRIMARY PERITONEUM: Resection   Procedure: Total hysterectomy and bilateral salpingo-oophorectomy  Specimen Integrity: Intact  Tumor Site: Favor right fallopian tube  Tumor Size: At least 1.7 cm  Histologic Type: High-grade serous carcinoma  Histologic Grade: High-grade  Ovarian Surface Involvement: Present, right and left  Fallopian Tube Surface Involvement: Present, right and left  Implants (required for advanced stage serous/seromucinous borderline  tumors only): Present: Peritoneal and omental  Other  Tissue/ Organ Involvement: Bilateral ovaries, uterine serosa,  omentum, pelvic peritoneum  Largest Extrapelvic Peritoneal Focus: At least 6.7 cm  Peritoneal/Ascitic Fluid Involvement: Not submitted/unknown  Chemotherapy Response Score (CRS): CRS 1 (no definite or minimal  response)  Regional Lymph Nodes: Not applicable (no lymph nodes submitted or found)   Distant Metastasis:       Distant Site(s) Involved: Not applicable  Pathologic Stage Classification (pTNM, AJCC 8th Edition): ypT3c, pN not  assigned  TNM classifiers: y (posttreatment)  Ancillary Studies: Can be performed upon request  Representative Tumor Block: A4  Comment(s): None      PHYSICAL EXAMINATION: ECOG PERFORMANCE STATUS: 1 - Symptomatic but completely ambulatory  Vitals:   06/30/21 1306  BP: 136/74  Pulse: 68  Resp: 17  Temp: 98 F (36.7 C)  SpO2: 100%   Filed Weights   06/30/21 1306  Weight: 154 lb 3.2 oz (69.9 kg)    GENERAL:alert, no distress and comfortable SKIN: skin color, texture, turgor are normal, no rashes or significant lesions EYES: normal, Conjunctiva are pink and non-injected, sclera clear OROPHARYNX:no exudate, no erythema and lips, buccal mucosa, and tongue normal  NECK: supple, thyroid normal size, non-tender, without nodularity LYMPH:  no palpable lymphadenopathy in the cervical, axillary or inguinal LUNGS: clear to auscultation and percussion with normal breathing effort HEART: regular rate & rhythm and no murmurs and no lower extremity edema ABDOMEN:abdomen soft, non-tender and normal bowel sounds.  Noted well-healed surgical scar Musculoskeletal:no cyanosis of digits and no clubbing  NEURO: alert & oriented x 3 with fluent speech, no focal motor/sensory deficits  LABORATORY DATA:  I have reviewed the data as listed    Component Value Date/Time   NA 139 06/30/2021 1245   K 4.7 06/30/2021 1245   CL 107 06/30/2021 1245   CO2 26 06/30/2021 1245   GLUCOSE 73 06/30/2021 1245    BUN 22 06/30/2021 1245   CREATININE 0.71 06/30/2021 1245   CREATININE 0.68 03/14/2016 1350   CALCIUM 9.9 06/30/2021 1245   PROT 7.7 06/30/2021 1245   ALBUMIN 4.1 06/30/2021 1245   AST 21 06/30/2021 1245   ALT 17 06/30/2021 1245   ALKPHOS 68 06/30/2021 1245   BILITOT 0.5 06/30/2021 1245   GFRNONAA >60 06/30/2021 1245   GFRAA >60 03/19/2016 0455    No results found for: SPEP, UPEP  Lab Results  Component Value Date   WBC 5.9 06/30/2021   NEUTROABS 3.0 06/30/2021   HGB 12.5 06/30/2021   HCT 38.5 06/30/2021   MCV 103.8 (H) 06/30/2021   PLT 297 06/30/2021      Chemistry      Component Value Date/Time   NA 139 06/30/2021 1245   K 4.7 06/30/2021 1245   CL 107 06/30/2021 1245   CO2 26 06/30/2021 1245   BUN 22 06/30/2021 1245   CREATININE 0.71 06/30/2021 1245   CREATININE 0.68 03/14/2016 1350      Component Value Date/Time   CALCIUM 9.9 06/30/2021 1245   ALKPHOS 68 06/30/2021 1245   AST  21 06/30/2021 1245   ALT 17 06/30/2021 1245   BILITOT 0.5 06/30/2021 1245

## 2021-06-30 NOTE — Patient Instructions (Signed)
[facility]COLOG[facility]  Discharge Instructions: Thank you for choosing [facility] to provide your oncology and hematology care.   If you have a lab appointment with the Cancer Center, please go directly to the Cancer Center and check in at the registration area.   Wear comfortable clothing and clothing appropriate for easy access to any Portacath or PICC line.   We strive to give you quality time with your provider. You may need to reschedule your appointment if you arrive late (15 or more minutes).  Arriving late affects you and other patients whose appointments are after yours.  Also, if you miss three or more appointments without notifying the office, you may be dismissed from the clinic at the provider's discretion.      For prescription refill requests, have your pharmacy contact our office and allow 72 hours for refills to be completed.    Today you received the following chemotherapy and/or immunotherapy agents carboplatin      To help prevent nausea and vomiting after your treatment, we encourage you to take your nausea medication as directed.  BELOW ARE SYMPTOMS THAT SHOULD BE REPORTED IMMEDIATELY: *FEVER GREATER THAN 100.4 F (38 C) OR HIGHER *CHILLS OR SWEATING *NAUSEA AND VOMITING THAT IS NOT CONTROLLED WITH YOUR NAUSEA MEDICATION *UNUSUAL SHORTNESS OF BREATH *UNUSUAL BRUISING OR BLEEDING *URINARY PROBLEMS (pain or burning when urinating, or frequent urination) *BOWEL PROBLEMS (unusual diarrhea, constipation, pain near the anus) TENDERNESS IN MOUTH AND THROAT WITH OR WITHOUT PRESENCE OF ULCERS (sore throat, sores in mouth, or a toothache) UNUSUAL RASH, SWELLING OR PAIN  UNUSUAL VAGINAL DISCHARGE OR ITCHING   Items with * indicate a potential emergency and should be followed up as soon as possible or go to the Emergency Department if any problems should occur.  Please show the CHEMOTHERAPY ALERT CARD or IMMUNOTHERAPY ALERT CARD at check-in to  the Emergency Department and triage nurse.  Should you have questions after your visit or need to cancel or reschedule your appointment, please contact [facility]  Dept: [phone]  and follow the prompts.  Office hours are 8:00 a.m. to 4:30 p.m. Monday - Friday. Please note that voicemails left after 4:00 p.m. may not be returned until the following business day.  We are closed weekends and major holidays. You have access to a nurse at all times for urgent questions. Please call the main number to the clinic Dept: [phone] and follow the prompts.   For any non-urgent questions, you may also contact your provider using MyChart. We now offer e-Visits for anyone 18 and older to request care online for non-urgent symptoms. For details visit [email or web address].   Also download the MyChart app! Go to the app store, search "MyChart", open the app, select [facility], and log in with your MyChart username and password.  Due to Covid, a mask is required upon entering the hospital/clinic. If you do not have a mask, one will be given to you upon arrival. For doctor visits, patients may have 1 support person aged 18 or older with them. For treatment visits, patients cannot have anyone with them due to current Covid guidelines and our immunocompromised population.   

## 2021-07-01 ENCOUNTER — Telehealth: Payer: Self-pay | Admitting: Hematology and Oncology

## 2021-07-01 LAB — CA 125: Cancer Antigen (CA) 125: 72.8 U/mL — ABNORMAL HIGH (ref 0.0–38.1)

## 2021-07-01 NOTE — Telephone Encounter (Signed)
Scheduled appointment per 1/5 los. Left message.

## 2021-07-14 ENCOUNTER — Other Ambulatory Visit: Payer: Self-pay | Admitting: Hematology and Oncology

## 2021-07-21 ENCOUNTER — Inpatient Hospital Stay: Payer: Medicare Other

## 2021-07-21 ENCOUNTER — Inpatient Hospital Stay: Payer: Medicare Other | Admitting: Hematology and Oncology

## 2021-07-21 ENCOUNTER — Telehealth: Payer: Self-pay

## 2021-07-21 ENCOUNTER — Other Ambulatory Visit: Payer: Self-pay

## 2021-07-21 VITALS — BP 126/70 | HR 68 | Temp 97.9°F | Resp 16 | Wt 155.5 lb

## 2021-07-21 DIAGNOSIS — R590 Localized enlarged lymph nodes: Secondary | ICD-10-CM | POA: Diagnosis not present

## 2021-07-21 DIAGNOSIS — C482 Malignant neoplasm of peritoneum, unspecified: Secondary | ICD-10-CM

## 2021-07-21 DIAGNOSIS — C786 Secondary malignant neoplasm of retroperitoneum and peritoneum: Secondary | ICD-10-CM | POA: Diagnosis not present

## 2021-07-21 DIAGNOSIS — C5701 Malignant neoplasm of right fallopian tube: Secondary | ICD-10-CM

## 2021-07-21 DIAGNOSIS — J90 Pleural effusion, not elsewhere classified: Secondary | ICD-10-CM | POA: Diagnosis not present

## 2021-07-21 DIAGNOSIS — Z5111 Encounter for antineoplastic chemotherapy: Secondary | ICD-10-CM | POA: Diagnosis not present

## 2021-07-21 DIAGNOSIS — Z79899 Other long term (current) drug therapy: Secondary | ICD-10-CM | POA: Diagnosis not present

## 2021-07-21 LAB — CBC WITH DIFFERENTIAL (CANCER CENTER ONLY)
Abs Immature Granulocytes: 0.01 10*3/uL (ref 0.00–0.07)
Basophils Absolute: 0 10*3/uL (ref 0.0–0.1)
Basophils Relative: 0 %
Eosinophils Absolute: 0 10*3/uL (ref 0.0–0.5)
Eosinophils Relative: 1 %
HCT: 33.4 % — ABNORMAL LOW (ref 36.0–46.0)
Hemoglobin: 11.1 g/dL — ABNORMAL LOW (ref 12.0–15.0)
Immature Granulocytes: 0 %
Lymphocytes Relative: 49 %
Lymphs Abs: 1.6 10*3/uL (ref 0.7–4.0)
MCH: 33.1 pg (ref 26.0–34.0)
MCHC: 33.2 g/dL (ref 30.0–36.0)
MCV: 99.7 fL (ref 80.0–100.0)
Monocytes Absolute: 0.3 10*3/uL (ref 0.1–1.0)
Monocytes Relative: 8 %
Neutro Abs: 1.3 10*3/uL — ABNORMAL LOW (ref 1.7–7.7)
Neutrophils Relative %: 42 %
Platelet Count: 119 10*3/uL — ABNORMAL LOW (ref 150–400)
RBC: 3.35 MIL/uL — ABNORMAL LOW (ref 3.87–5.11)
RDW: 12.2 % (ref 11.5–15.5)
WBC Count: 3.2 10*3/uL — ABNORMAL LOW (ref 4.0–10.5)
nRBC: 0 % (ref 0.0–0.2)

## 2021-07-21 LAB — CMP (CANCER CENTER ONLY)
ALT: 18 U/L (ref 0–44)
AST: 20 U/L (ref 15–41)
Albumin: 4 g/dL (ref 3.5–5.0)
Alkaline Phosphatase: 72 U/L (ref 38–126)
Anion gap: 7 (ref 5–15)
BUN: 22 mg/dL (ref 8–23)
CO2: 25 mmol/L (ref 22–32)
Calcium: 9.6 mg/dL (ref 8.9–10.3)
Chloride: 106 mmol/L (ref 98–111)
Creatinine: 0.72 mg/dL (ref 0.44–1.00)
GFR, Estimated: >60 mL/min (ref >60)
Glucose, Bld: 94 mg/dL (ref 70–99)
Potassium: 4 mmol/L (ref 3.5–5.1)
Sodium: 138 mmol/L (ref 135–145)
Total Bilirubin: 0.5 mg/dL (ref 0.3–1.2)
Total Protein: 7.1 g/dL (ref 6.5–8.1)

## 2021-07-21 MED ORDER — SODIUM CHLORIDE 0.9 % IV SOLN
480.0000 mg | Freq: Once | INTRAVENOUS | Status: AC
  Administered 2021-07-21: 480 mg via INTRAVENOUS
  Filled 2021-07-21: qty 48

## 2021-07-21 MED ORDER — SODIUM CHLORIDE 0.9 % IV SOLN
10.0000 mg | Freq: Once | INTRAVENOUS | Status: AC
  Administered 2021-07-21: 10 mg via INTRAVENOUS
  Filled 2021-07-21: qty 10

## 2021-07-21 MED ORDER — PALONOSETRON HCL INJECTION 0.25 MG/5ML
0.2500 mg | Freq: Once | INTRAVENOUS | Status: AC
  Administered 2021-07-21: 0.25 mg via INTRAVENOUS
  Filled 2021-07-21: qty 5

## 2021-07-21 MED ORDER — SODIUM CHLORIDE 0.9 % IV SOLN
150.0000 mg | Freq: Once | INTRAVENOUS | Status: AC
  Administered 2021-07-21: 150 mg via INTRAVENOUS
  Filled 2021-07-21: qty 150

## 2021-07-21 MED ORDER — SODIUM CHLORIDE 0.9 % IV SOLN: Freq: Once | INTRAVENOUS | Status: AC

## 2021-07-21 NOTE — Telephone Encounter (Signed)
Spoke with her in lobby. She is feeling good today. Appt with Dr. Alvy Bimler canceled for today due to her being out of the office. Arrived her to the infusion room.

## 2021-07-21 NOTE — Progress Notes (Signed)
Per Dr. Burr Medico, ok to proceed with [place] 1.3.

## 2021-07-21 NOTE — Patient Instructions (Signed)
[facility]  Discharge Instructions: Thank you for choosing [facility] to provide your oncology and hematology care.   If you have a lab appointment with the Cancer Center, please go directly to the Cancer Center and check in at the registration area.   Wear comfortable clothing and clothing appropriate for easy access to any Portacath or PICC line.   We strive to give you quality time with your provider. You may need to reschedule your appointment if you arrive late (15 or more minutes).  Arriving late affects you and other patients whose appointments are after yours.  Also, if you miss three or more appointments without notifying the office, you may be dismissed from the clinic at the provider's discretion.      For prescription refill requests, have your pharmacy contact our office and allow 72 hours for refills to be completed.    Today you received the following chemotherapy and/or immunotherapy agents carboplatin      To help prevent nausea and vomiting after your treatment, we encourage you to take your nausea medication as directed.  BELOW ARE SYMPTOMS THAT SHOULD BE REPORTED IMMEDIATELY: *FEVER GREATER THAN 100.4 F (38 C) OR HIGHER *CHILLS OR SWEATING *NAUSEA AND VOMITING THAT IS NOT CONTROLLED WITH YOUR NAUSEA MEDICATION *UNUSUAL SHORTNESS OF BREATH *UNUSUAL BRUISING OR BLEEDING *URINARY PROBLEMS (pain or burning when urinating, or frequent urination) *BOWEL PROBLEMS (unusual diarrhea, constipation, pain near the anus) TENDERNESS IN MOUTH AND THROAT WITH OR WITHOUT PRESENCE OF ULCERS (sore throat, sores in mouth, or a toothache) UNUSUAL RASH, SWELLING OR PAIN  UNUSUAL VAGINAL DISCHARGE OR ITCHING   Items with * indicate a potential emergency and should be followed up as soon as possible or go to the Emergency Department if any problems should occur.  Please show the CHEMOTHERAPY ALERT CARD or IMMUNOTHERAPY ALERT CARD at check-in to  the Emergency Department and triage nurse.  Should you have questions after your visit or need to cancel or reschedule your appointment, please contact [facility]  Dept: [phone]  and follow the prompts.  Office hours are 8:00 a.m. to 4:30 p.m. Monday - Friday. Please note that voicemails left after 4:00 p.m. may not be returned until the following business day.  We are closed weekends and major holidays. You have access to a nurse at all times for urgent questions. Please call the main number to the clinic Dept: [phone] and follow the prompts.   For any non-urgent questions, you may also contact your provider using MyChart. We now offer e-Visits for anyone 18 and older to request care online for non-urgent symptoms. For details visit [email or web address].   Also download the MyChart app! Go to the app store, search "MyChart", open the app, select [facility], and log in with your MyChart username and password.  Due to Covid, a mask is required upon entering the hospital/clinic. If you do not have a mask, one will be given to you upon arrival. For doctor visits, patients may have 1 support person aged 18 or older with them. For treatment visits, patients cannot have anyone with them due to current Covid guidelines and our immunocompromised population.   

## 2021-07-22 ENCOUNTER — Telehealth: Payer: Self-pay

## 2021-07-22 ENCOUNTER — Other Ambulatory Visit: Payer: Self-pay | Admitting: Hematology

## 2021-07-22 DIAGNOSIS — C482 Malignant neoplasm of peritoneum, unspecified: Secondary | ICD-10-CM

## 2021-07-22 DIAGNOSIS — G8918 Other acute postprocedural pain: Secondary | ICD-10-CM

## 2021-07-22 MED ORDER — OXYCODONE HCL 5 MG PO TABS: 5.0000 mg | ORAL_TABLET | Freq: Three times a day (TID) | ORAL | 0 refills | Status: DC | PRN

## 2021-07-22 NOTE — Telephone Encounter (Signed)
Returned her call. She is complaining of a headache since yesterdays treatment. She has tried tylenol with no relief. Ask for Percocet Rx if possible.

## 2021-07-22 NOTE — Telephone Encounter (Signed)
Called and left a message. Dr. Burr Medico sent a Oxycodone Rx to her pharmacy. Ask her to call the office back for questions.

## 2021-08-08 ENCOUNTER — Other Ambulatory Visit: Payer: Self-pay

## 2021-08-08 MED ORDER — ESCITALOPRAM OXALATE 10 MG PO TABS: 15.0000 mg | ORAL_TABLET | Freq: Every day | ORAL | 3 refills | Status: DC

## 2021-08-10 MED FILL — Fosaprepitant Dimeglumine For IV Infusion 150 MG (Base Eq): INTRAVENOUS | Qty: 5 | Status: AC

## 2021-08-10 MED FILL — Dexamethasone Sodium Phosphate Inj 100 MG/10ML: INTRAMUSCULAR | Qty: 1 | Status: AC

## 2021-08-11 ENCOUNTER — Other Ambulatory Visit: Payer: Self-pay | Admitting: Hematology and Oncology

## 2021-08-11 ENCOUNTER — Telehealth: Payer: Self-pay

## 2021-08-11 ENCOUNTER — Inpatient Hospital Stay: Payer: Medicare Other | Admitting: Hematology and Oncology

## 2021-08-11 ENCOUNTER — Encounter: Payer: Self-pay | Admitting: Hematology and Oncology

## 2021-08-11 ENCOUNTER — Other Ambulatory Visit (HOSPITAL_COMMUNITY): Payer: Self-pay

## 2021-08-11 ENCOUNTER — Inpatient Hospital Stay: Payer: Medicare Other

## 2021-08-11 ENCOUNTER — Other Ambulatory Visit: Payer: Self-pay

## 2021-08-11 ENCOUNTER — Inpatient Hospital Stay: Payer: Medicare Other | Attending: Genetic Counselor

## 2021-08-11 VITALS — BP 135/71 | HR 78 | Temp 97.4°F | Resp 18 | Ht 66.0 in | Wt 158.2 lb

## 2021-08-11 DIAGNOSIS — C5701 Malignant neoplasm of right fallopian tube: Secondary | ICD-10-CM

## 2021-08-11 DIAGNOSIS — D61818 Other pancytopenia: Secondary | ICD-10-CM | POA: Insufficient documentation

## 2021-08-11 DIAGNOSIS — R110 Nausea: Secondary | ICD-10-CM | POA: Insufficient documentation

## 2021-08-11 DIAGNOSIS — Z7189 Other specified counseling: Secondary | ICD-10-CM

## 2021-08-11 DIAGNOSIS — C482 Malignant neoplasm of peritoneum, unspecified: Secondary | ICD-10-CM

## 2021-08-11 DIAGNOSIS — M7918 Myalgia, other site: Secondary | ICD-10-CM | POA: Insufficient documentation

## 2021-08-11 DIAGNOSIS — Z5111 Encounter for antineoplastic chemotherapy: Secondary | ICD-10-CM | POA: Insufficient documentation

## 2021-08-11 DIAGNOSIS — Z85038 Personal history of other malignant neoplasm of large intestine: Secondary | ICD-10-CM | POA: Diagnosis not present

## 2021-08-11 DIAGNOSIS — C786 Secondary malignant neoplasm of retroperitoneum and peritoneum: Secondary | ICD-10-CM | POA: Diagnosis not present

## 2021-08-11 DIAGNOSIS — Z79899 Other long term (current) drug therapy: Secondary | ICD-10-CM | POA: Insufficient documentation

## 2021-08-11 LAB — CMP (CANCER CENTER ONLY)
ALT: 19 U/L (ref 0–44)
AST: 20 U/L (ref 15–41)
Albumin: 4.1 g/dL (ref 3.5–5.0)
Alkaline Phosphatase: 69 U/L (ref 38–126)
Anion gap: 9 (ref 5–15)
BUN: 15 mg/dL (ref 8–23)
CO2: 23 mmol/L (ref 22–32)
Calcium: 9.5 mg/dL (ref 8.9–10.3)
Chloride: 107 mmol/L (ref 98–111)
Creatinine: 0.65 mg/dL (ref 0.44–1.00)
GFR, Estimated: >60 mL/min (ref >60)
Glucose, Bld: 88 mg/dL (ref 70–99)
Potassium: 3.9 mmol/L (ref 3.5–5.1)
Sodium: 139 mmol/L (ref 135–145)
Total Bilirubin: 0.6 mg/dL (ref 0.3–1.2)
Total Protein: 7.3 g/dL (ref 6.5–8.1)

## 2021-08-11 LAB — CBC WITH DIFFERENTIAL (CANCER CENTER ONLY)
Abs Immature Granulocytes: 0.01 10*3/uL (ref 0.00–0.07)
Basophils Absolute: 0 10*3/uL (ref 0.0–0.1)
Basophils Relative: 0 %
Eosinophils Absolute: 0 10*3/uL (ref 0.0–0.5)
Eosinophils Relative: 0 %
HCT: 32 % — ABNORMAL LOW (ref 36.0–46.0)
Hemoglobin: 10.5 g/dL — ABNORMAL LOW (ref 12.0–15.0)
Immature Granulocytes: 0 %
Lymphocytes Relative: 51 %
Lymphs Abs: 1.5 10*3/uL (ref 0.7–4.0)
MCH: 33.1 pg (ref 26.0–34.0)
MCHC: 32.8 g/dL (ref 30.0–36.0)
MCV: 100.9 fL — ABNORMAL HIGH (ref 80.0–100.0)
Monocytes Absolute: 0.4 10*3/uL (ref 0.1–1.0)
Monocytes Relative: 12 %
Neutro Abs: 1.1 10*3/uL — ABNORMAL LOW (ref 1.7–7.7)
Neutrophils Relative %: 37 %
Platelet Count: 145 10*3/uL — ABNORMAL LOW (ref 150–400)
RBC: 3.17 MIL/uL — ABNORMAL LOW (ref 3.87–5.11)
RDW: 13.9 % (ref 11.5–15.5)
WBC Count: 2.9 10*3/uL — ABNORMAL LOW (ref 4.0–10.5)
nRBC: 0 % (ref 0.0–0.2)

## 2021-08-11 MED ORDER — OLAPARIB 100 MG PO TABS
100.0000 mg | ORAL_TABLET | Freq: Two times a day (BID) | ORAL | 11 refills | Status: DC
Start: 2021-08-11 — End: 2022-03-20
  Filled 2021-08-11: qty 60, 30d supply, fill #0

## 2021-08-11 MED ORDER — SODIUM CHLORIDE 0.9 % IV SOLN
485.4000 mg | Freq: Once | INTRAVENOUS | Status: AC
  Administered 2021-08-11: 490 mg via INTRAVENOUS
  Filled 2021-08-11: qty 49

## 2021-08-11 MED ORDER — PALONOSETRON HCL INJECTION 0.25 MG/5ML
0.2500 mg | Freq: Once | INTRAVENOUS | Status: AC
  Administered 2021-08-11: 0.25 mg via INTRAVENOUS
  Filled 2021-08-11: qty 5

## 2021-08-11 MED ORDER — OXYCODONE-ACETAMINOPHEN 5-325 MG PO TABS: 1.0000 | ORAL_TABLET | Freq: Four times a day (QID) | ORAL | 0 refills | Status: DC | PRN

## 2021-08-11 MED ORDER — SODIUM CHLORIDE 0.9 % IV SOLN
150.0000 mg | Freq: Once | INTRAVENOUS | Status: AC
  Administered 2021-08-11: 150 mg via INTRAVENOUS
  Filled 2021-08-11: qty 150

## 2021-08-11 MED ORDER — SODIUM CHLORIDE 0.9 % IV SOLN
10.0000 mg | Freq: Once | INTRAVENOUS | Status: AC
  Administered 2021-08-11: 10 mg via INTRAVENOUS
  Filled 2021-08-11: qty 10

## 2021-08-11 MED ORDER — ZOLPIDEM TARTRATE 10 MG PO TABS: 10.0000 mg | ORAL_TABLET | Freq: Every evening | ORAL | 2 refills | Status: DC | PRN

## 2021-08-11 MED ORDER — SODIUM CHLORIDE 0.9 % IV SOLN: Freq: Once | INTRAVENOUS | Status: AC

## 2021-08-11 MED ORDER — OLAPARIB 100 MG PO TABS
200.0000 mg | ORAL_TABLET | Freq: Two times a day (BID) | ORAL | 11 refills | Status: DC
  Filled 2021-08-11: qty 60, 15d supply, fill #0

## 2021-08-11 NOTE — Telephone Encounter (Signed)
Oral Oncology Patient Advocate Encounter   Received notification from [facility] that prior authorization for Leslie Rivera is required.   PA submitted on CoverMyMeds Key [id number] Status is pending   Oral Oncology Clinic will continue to follow.  [place] [facility] [name] Phone [phone] Fax [phone] 08/11/2021 10:56 AM

## 2021-08-11 NOTE — Assessment & Plan Note (Signed)
We have extensive discussions about goals of care Quality of life is important to her Due to her history of chronic pancytopenia, I plan reduced dose olaparib for her at 100 mg twice daily to start at least a month away from her treatment She is in agreement

## 2021-08-11 NOTE — Assessment & Plan Note (Signed)
This is an expected side effects of treatment We will proceed with treatment without delay

## 2021-08-11 NOTE — Progress Notes (Signed)
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[facility]) I have a long discussion with the patient and her sister She will complete last cycle of treatment today After that, I plan to repeat imaging study in the month and then start her on PARP inhibitor I will get insurance authorization to get Lonie Peak ready to start the week of March 20  Pancytopenia, acquired [facility]) This is an expected side effects of treatment We will proceed with treatment without delay  Goals of care, counseling/discussion We have extensive discussions about goals of care Quality of life is important to her Due to her history of chronic pancytopenia, I plan reduced dose olaparib for her at 100 mg twice daily to start at least a month away from her treatment She is in agreement  Orders Placed This Encounter  Procedures   CT CHEST ABDOMEN PELVIS W CONTRAST    Standing Status:   Future    Standing Expiration Date:   08/11/2022    Order Specific Question:   Preferred imaging location?    Answer:   [facility]    Order Specific Question:   Radiology Contrast Protocol - do NOT remove file path    Answer:   [email or web address]    All questions were answered. The patient knows to call the clinic with any problems, questions or concerns. The total time spent in the appointment was 40 minutes encounter with patients including review of chart and various tests results, discussions about plan of care and coordination of care plan   Heath Lark, MD 08/11/2021 10:39 AM  INTERVAL HISTORY: Please see below for problem oriented charting. she returns for treatment follow-up with her sister, seen prior to her final dose of  treatment She had very mild nausea with her last cycle of treatment but overall able to tolerate it She has occasional musculoskeletal pain No recent infection, fever or chills  REVIEW OF SYSTEMS:   Constitutional: Denies fevers, chills or abnormal weight loss Eyes: Denies blurriness of vision Ears, nose, mouth, throat, and face: Denies mucositis or sore throat Respiratory: Denies cough, dyspnea or wheezes Cardiovascular: Denies palpitation, chest discomfort or lower extremity swelling Gastrointestinal:  Denies nausea, heartburn or change in bowel habits Skin: Denies abnormal skin rashes Lymphatics: Denies new lymphadenopathy or easy bruising Neurological:Denies numbness, tingling or new weaknesses Behavioral/Psych: Mood is stable, no new changes  All other systems were reviewed with the patient and are negative.  I have reviewed the past medical history, past surgical history, social history and family history with the patient and they are unchanged from previous note.  ALLERGIES:  is allergic to barium-containing compounds, hydrocodone, erythromycin, and zofran [ondansetron].  MEDICATIONS:  Current Outpatient Medications  Medication Sig Dispense Refill   olaparib (LYNPARZA) 100 MG tablet Take 2 tablets (200 mg total) by mouth 2 (two) times daily. Swallow whole. May take with food to decrease nausea and vomiting. 60 tablet 11   oxyCODONE-acetaminophen (PERCOCET/ROXICET) 5-325 MG tablet Take 1 tablet by mouth every 6 (six) hours as needed for severe pain. 30 tablet 0   Calcium Carb-Cholecalciferol (CALCIUM 600+D3 PO) Take 1 tablet by mouth in the morning and at bedtime.     cetirizine (ZYRTEC) 10 MG tablet Take 10 mg by mouth every 30 (thirty) days. As needed  cholecalciferol (VITAMIN D3) 25 MCG (1000 UNIT) tablet Take 1,000 Units by mouth in the morning.     diazepam (VALIUM) 5 MG tablet Take 5 mg by mouth daily as needed for anxiety.     escitalopram (LEXAPRO) 10 MG tablet Take  1.5 tablets (15 mg total) by mouth daily. 45 tablet 3   levothyroxine (SYNTHROID, LEVOTHROID) 75 MCG tablet Take 75 mcg by mouth daily before breakfast.  3   ondansetron (ZOFRAN) 8 MG tablet Take 1 tablet (8 mg total) by mouth every 8 (eight) hours as needed for refractory nausea / vomiting. (Patient not taking: Reported on 04/28/2021) 30 tablet 1   Polyethyl Glycol-Propyl Glycol (SYSTANE) 0.4-0.3 % SOLN Place 1-2 drops into both eyes 3 (three) times daily as needed (dry/irritated eyes.).     prochlorperazine (COMPAZINE) 10 MG tablet Take 1 tablet (10 mg total) by mouth every 6 (six) hours as needed (Nausea or vomiting). (Patient not taking: Reported on 04/28/2021) 30 tablet 1   valACYclovir (VALTREX) 1000 MG tablet Take 1,000 mg by mouth daily as needed (Fever Blister).     zolpidem (AMBIEN) 10 MG tablet Take 1 tablet (10 mg total) by mouth at bedtime as needed. for sleep 30 tablet 2   No current facility-administered medications for this visit.   Facility-Administered Medications Ordered in Other Visits  Medication Dose Route Frequency Provider Last Rate Last Admin   CARBOplatin (PARAPLATIN) 490 mg in sodium chloride 0.9 % 250 mL chemo infusion  490 mg Intravenous Once Alvy Bimler, Braydon Kullman, MD        SUMMARY OF ONCOLOGIC HISTORY: Oncology History Overview Note  Normal MMR HRD positive   Cancer of right fallopian tube ([place])  01/05/2021 Pathology Results   SURGICAL PATHOLOGY  CASE: [id number]  PATIENT: [facility]  Surgical Pathology Report   Reason for Addendum #1:  Immunohistochemistry results  Reason for Addendum #2:  DNA Mismatch Repair IHC Results   Clinical History: history of colon cancer, now with omental thickening  (cm)   FINAL MICROSCOPIC DIAGNOSIS:   A. OMENTUM, NEEDLE CORE BIOPSY:  - Metastatic poorly differentiated adenocarcinoma.  See comment   COMMENT:   Immunohistochemical stains show that the tumor cells are positive for CK7 and negative for CDX2 and CK20.  This  immunoprofile is nonspecific and differential diagnosis can include an upper gastrointestinal, breast and lung primary among other possibilities.  Only a very small fraction of primary colonic adenocarcinoma was immunoprofile.  Clinical and radiologic correlation suggested.   Additional immunohistochemical stains are performed and show that the tumor cells are positive for WT1, PAX8 and ER.  Immunostain for p53 shows a clonal overexpression pattern.  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  Clinical and radiologic correlation is suggested.    01/19/2021 Tumor Marker   Patient's tumor was tested for the following markers: CA-125. Results of the tumor marker test revealed 6790.   01/28/2021 PET scan   1. Examination is positive for extensive FDG avid peritoneal disease within the abdomen and pelvis including multiple implants upon the surface of liver and extensive omental caking. Moderate ascites noted within the pelvis. 2. FDG avid retroperitoneal, retrocrural, right internal mammary, and mediastinal lymph nodes compatible with metastatic adenopathy. 3. Small right pleural effusion. 4.  Aortic Atherosclerosis (ICD10-I70.0).     01/31/2021 Initial Diagnosis   Primary peritoneal carcinomatosis ([place])   01/31/2021 Cancer Staging   Staging form: Ovary, Fallopian Tube, and Primary Peritoneal Carcinoma, AJCC 8th Edition -  Clinical stage from 01/31/2021: FIGO Stage IV (cT2b, cN1b, cM1) - Signed by Heath Lark, MD on 01/31/2021 Stage prefix: Initial diagnosis    02/14/2021 Tumor Marker   Patient's tumor was tested for the following markers: CA-125. Results of the tumor marker test revealed 8668.   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 Remainder of testing was  negative/normal. The report date is 02/09/2021.  The CancerNext-Expanded + RNAinsight gene panel offered by [employer] and includes sequencing and rearrangement analysis for the following 77 genes: IP, ALK, APC*, ATM*, AXIN2, BAP1, BARD1, BLM, BMPR1A, BRCA1*, BRCA2*, BRIP1*, CDC73, CDH1*,CDK4, CDKN1B, CDKN2A, CHEK2*, CTNNA1, DICER1, FANCC, FH, FLCN, GALNT12, KIF1B, LZTR1, MAX, MEN1, MET, MLH1*, MSH2*, MSH3, MSH6*, MUTYH*, NBN, NF1*, NF2, NTHL1, PALB2*, PHOX2B, PMS2*, POT1, PRKAR1A, PTCH1, PTEN*, RAD51C*, RAD51D*,RB1, RECQL, RET, SDHA, SDHAF2, SDHB, SDHC, SDHD, SMAD4, SMARCA4, SMARCB1, SMARCE1, STK11, SUFU, TMEM127, TP53*,TSC1, TSC2, VHL and XRCC2 (sequencing and deletion/duplication); EGFR, EGLN1, HOXB13, KIT, MITF, PDGFRA, POLD1 and POLE (sequencing only); EPCAM and GREM1 (deletion/duplication only).   03/10/2021 Tumor Marker   Patient's tumor was tested for the following markers: CA-125. Results of the tumor marker test revealed 2002.   04/14/2021 Tumor Marker   Patient's tumor was tested for the following markers: CA-125. Results of the tumor marker test revealed 278.   04/26/2021 Imaging   Significant decrease in peritoneal carcinomatosis since previous study, with resolution of ascites.   Decreased anterior mediastinal and retrocrural lymphadenopathy.   No new or progressive metastatic disease within the chest, abdomen, or pelvis.   Aortic Atherosclerosis (ICD10-I70.0).   04/29/2021 Tumor Marker   Patient's tumor was tested for the following markers: CA-125. Results of the tumor marker test revealed 134.   05/12/2021 Surgery   Tumor debulking including robotic-assisted laparoscopic total hysterectomy with bilateral salpingoophorectomy, lysis of adhesions, excision and fulguration of peritoneal nodules, mini laparotomy for intra-abdominal palpation and omentectomy  Findings: : On exam under anesthesia, small mobile uterus.  On intra-abdominal entry, normal upper abdominal survey  including liver and stomach.  Right diaphragm with small peritoneal studding, all lesions less than 5 mm.  Omentum with an approximately 5 cm mass adherent to the lower anterior abdominal wall and bladder peritoneum.  Normal 6 cm uterus.  Somewhat atrophic appearing bilateral adnexa with tumor implants.  Sigmoid and rectal colon and mesentery with moderate peritoneal disease, all lesions less than 5 mm.  Multiple peritoneal implants on bilateral pelvic sidewalls and in the posterior cul-de-sac, all measuring less than 5 mm.  Multiple omental implants measuring several mm.  Approximately 1 cm area within the transverse colon mesentery, unclear if this is related to scar tissue from prior colon resection versus tumor implant.  No obvious pelvic adenopathy, no palpable para-aortic adenopathy. R1 resection at the end of surgery   05/12/2021 Pathology Results   A. UTERUS, CERVIX, BILATERAL FALLOPIAN TUBES AND OVARIES:  - Fallopian tubes:       - Bilateral fallopian tubes involved by high-grade serous  carcinoma.  - Ovaries:       - Bilateral ovaries involved by high-grade serous carcinoma.  - Uterine cervix:       - Benign transformation zone.       - Negative for squamous intraepithelial lesion and malignancy.  - Endometrium:       - Inactive endometrium.       -  Negative for atypical hyperplasia/EIN and malignancy.  - Myometrium:       - Negative for malignancy.  - Uterine serosa:       - Involved by metastatic high-grade serous carcinoma.   B. PERITONEAL IMPLANT, EXCISION:  - Metastatic high-grade serous carcinoma.   C. OMENTUM, RESECTION:  - Metastatic high-grade serous carcinoma, multifocal.   COMMENT:   A. Immunohistochemical studies show tumor cells to be positive for CK7,  Pax-8, and WT-1, and negative for CK20, PR, and CDX-2. These findings  support the above diagnosis. A p53 shows strong and diffuse staining  within neoplastic cells, indicating mutated p53 status.   OVARY or  FALLOPIAN TUBE or PRIMARY PERITONEUM: Resection   Procedure: Total hysterectomy and bilateral salpingo-oophorectomy  Specimen Integrity: Intact  Tumor Site: Favor right fallopian tube  Tumor Size: At least 1.7 cm  Histologic Type: High-grade serous carcinoma  Histologic Grade: High-grade  Ovarian Surface Involvement: Present, right and left  Fallopian Tube Surface Involvement: Present, right and left  Implants (required for advanced stage serous/seromucinous borderline  tumors only): Present: Peritoneal and omental  Other Tissue/ Organ Involvement: Bilateral ovaries, uterine serosa,  omentum, pelvic peritoneum  Largest Extrapelvic Peritoneal Focus: At least 6.7 cm  Peritoneal/Ascitic Fluid Involvement: Not submitted/unknown  Chemotherapy Response Score (CRS): CRS 1 (no definite or minimal  response)  Regional Lymph Nodes: Not applicable (no lymph nodes submitted or found)   Distant Metastasis:       Distant Site(s) Involved: Not applicable  Pathologic Stage Classification (pTNM, AJCC 8th Edition): ypT3c, pN not  assigned  TNM classifiers: y (posttreatment)  Ancillary Studies: Can be performed upon request  Representative Tumor Block: A4  Comment(s): None    06/30/2021 Tumor Marker   Patient's tumor was tested for the following markers: CA-125. Results of the tumor marker test revealed 72.8.     PHYSICAL EXAMINATION: ECOG PERFORMANCE STATUS: 1 - Symptomatic but completely ambulatory  Vitals:   08/11/21 0854  BP: 135/71  Pulse: 78  Resp: 18  Temp: (!) 97.4 F (36.3 C)  SpO2: 100%   Filed Weights   08/11/21 0854  Weight: 158 lb 3.2 oz (71.8 kg)    GENERAL:alert, no distress and comfortable NEURO: alert & oriented x 3 with fluent speech, no focal motor/sensory deficits  LABORATORY DATA:  I have reviewed the data as listed    Component Value Date/Time   NA 139 08/11/2021 0843   K 3.9 08/11/2021 0843   CL 107 08/11/2021 0843   CO2 23 08/11/2021 0843   GLUCOSE 88  08/11/2021 0843   BUN 15 08/11/2021 0843   CREATININE 0.65 08/11/2021 0843   CREATININE 0.68 03/14/2016 1350   CALCIUM 9.5 08/11/2021 0843   PROT 7.3 08/11/2021 0843   ALBUMIN 4.1 08/11/2021 0843   AST 20 08/11/2021 0843   ALT 19 08/11/2021 0843   ALKPHOS 69 08/11/2021 0843   BILITOT 0.6 08/11/2021 0843   GFRNONAA >60 08/11/2021 0843   GFRAA >60 03/19/2016 0455    No results found for: SPEP, UPEP  Lab Results  Component Value Date   WBC 2.9 (L) 08/11/2021   NEUTROABS 1.1 (L) 08/11/2021   HGB 10.5 (L) 08/11/2021   HCT 32.0 (L) 08/11/2021   MCV 100.9 (H) 08/11/2021   PLT 145 (L) 08/11/2021      Chemistry      Component Value Date/Time   NA 139 08/11/2021 0843   K 3.9 08/11/2021 0843   CL 107 08/11/2021 0843   CO2 23  08/11/2021 0843   BUN 15 08/11/2021 0843   CREATININE 0.65 08/11/2021 0843   CREATININE 0.68 03/14/2016 1350      Component Value Date/Time   CALCIUM 9.5 08/11/2021 0843   ALKPHOS 69 08/11/2021 0843   AST 20 08/11/2021 0843   ALT 19 08/11/2021 0843   BILITOT 0.6 08/11/2021 0843

## 2021-08-11 NOTE — Patient Instructions (Signed)
[facility] MEDICAL ONCOLOGY  Discharge Instructions: Thank you for choosing [facility] to provide your oncology and hematology care.   If you have a lab appointment with the Cancer Center, please go directly to the Cancer Center and check in at the registration area.   Wear comfortable clothing and clothing appropriate for easy access to any Portacath or PICC line.   We strive to give you quality time with your provider. You may need to reschedule your appointment if you arrive late (15 or more minutes).  Arriving late affects you and other patients whose appointments are after yours.  Also, if you miss three or more appointments without notifying the office, you may be dismissed from the clinic at the provider's discretion.      For prescription refill requests, have your pharmacy contact our office and allow 72 hours for refills to be completed.    Today you received the following chemotherapy and/or immunotherapy agents :Carboplatin   To help prevent nausea and vomiting after your treatment, we encourage you to take your nausea medication as directed.  BELOW ARE SYMPTOMS THAT SHOULD BE REPORTED IMMEDIATELY: *FEVER GREATER THAN 100.4 F (38 C) OR HIGHER *CHILLS OR SWEATING *NAUSEA AND VOMITING THAT IS NOT CONTROLLED WITH YOUR NAUSEA MEDICATION *UNUSUAL SHORTNESS OF BREATH *UNUSUAL BRUISING OR BLEEDING *URINARY PROBLEMS (pain or burning when urinating, or frequent urination) *BOWEL PROBLEMS (unusual diarrhea, constipation, pain near the anus) TENDERNESS IN MOUTH AND THROAT WITH OR WITHOUT PRESENCE OF ULCERS (sore throat, sores in mouth, or a toothache) UNUSUAL RASH, SWELLING OR PAIN  UNUSUAL VAGINAL DISCHARGE OR ITCHING   Items with * indicate a potential emergency and should be followed up as soon as possible or go to the Emergency Department if any problems should occur.  Please show the CHEMOTHERAPY ALERT CARD or IMMUNOTHERAPY ALERT CARD at check-in to  the Emergency Department and triage nurse.  Should you have questions after your visit or need to cancel or reschedule your appointment, please contact [facility]ICAL [facility]COL[facility]  Dept: [phone]  and follow the prompts.  Office hours are 8:00 a.m. to 4:30 p.m. Monday - Friday. Please note that voicemails left after 4:00 p.m. may not be returned until the following business day.  We are closed weekends and major holidays. You have access to a nurse at all times for urgent questions. Please call the main number to the clinic Dept: [phone] and follow the prompts.   For any non-urgent questions, you may also contact your provider using MyChart. We now offer e-Visits for anyone 18 and older to request care online for non-urgent symptoms. For details visit [email or web address].   Also download the MyChart app! Go to the app store, search "MyChart", open the app, select [place], and log in with your MyChart username and password.  Due to Covid, a mask is required upon entering the hospital/clinic. If you do not have a mask, one will be given to you upon arrival. For doctor visits, patients may have 1 support person aged 18 or older with them. For treatment visits, patients cannot have anyone with them due to current Covid guidelines and our immunocompromised population.   

## 2021-08-11 NOTE — Telephone Encounter (Signed)
Oral Oncology Patient Advocate Encounter  Prior Authorization for Lonie Peak has been approved.    PA# [id number] Effective dates: 08/11/21 through 08/11/22  Patients co-pay is $2342  Oral Oncology Clinic will continue to follow.   [place] Patient [place] Phone [phone] Fax [phone] 08/11/2021 12:33 PM

## 2021-08-11 NOTE — Telephone Encounter (Signed)
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   Prescription dose and frequency assessed, clarified with MD the dose decrease for prescription. Dose decreased due to patients chronic pancytopenia.   Start date: 09/12/2021  Labs from 08/11/21 (CMP, CBC) assessed, no interventions needed.  Current medication list in Epic reviewed, DDIs with [place] identified: none  Evaluated chart and no patient barriers to medication adherence noted.   Patient agreement for treatment documented in MD note on 08/11/2021.  Prescription has been e-scribed to the [facility] for benefits analysis and approval.  Oral Oncology Clinic will continue to follow for insurance authorization, copayment issues, initial counseling and start date.  Drema Halon, PharmD Hematology/Oncology Clinical Pharmacist [facility] [phone] 08/11/2021 11:32 AM

## 2021-08-11 NOTE — Assessment & Plan Note (Signed)
I have a long discussion with the patient and her sister She will complete last cycle of treatment today After that, I plan to repeat imaging study in the month and then start her on PARP inhibitor I will get insurance authorization to get Lonie Peak ready to start the week of March 20

## 2021-08-12 ENCOUNTER — Telehealth: Payer: Self-pay

## 2021-08-12 LAB — CA 125: Cancer Antigen (CA) 125: 30.7 U/mL (ref 0.0–38.1)

## 2021-08-12 NOTE — Telephone Encounter (Signed)
-----   Message from Heath Lark, MD sent at 08/12/2021  9:00 AM EST ----- Let her know CA-125 is now normal

## 2021-08-12 NOTE — Telephone Encounter (Signed)
Called and left below message. Ask her to call the office for questions. ?

## 2021-08-15 ENCOUNTER — Telehealth: Payer: Self-pay

## 2021-08-15 NOTE — Telephone Encounter (Signed)
Patient is approved for Lynparza at no cost from [facility] and Me 08/15/21-06/25/22  Darreld Mclean uses [place] Patient [place] Phone [phone] Fax [phone] 08/15/2021 2:48 PM

## 2021-08-15 NOTE — Telephone Encounter (Signed)
Oral Oncology Patient Advocate Encounter  Met patient in [place] to complete [place] for [place] and [place] Patient [place] Program in an effort to reduce the patient's out of pocket expense for Lynparza to $0.    Application completed and faxed to [phone].   [place] patient [place] phone number for follow up is [phone].   This encounter will be updated until final determination.  [place] Patient [place] Phone [phone] Fax [phone] 08/15/2021 2:46 PM

## 2021-08-17 NOTE — Telephone Encounter (Signed)
Oral Chemotherapy Pharmacist Encounter  Spoke to patient and patients sister to inform them that she was approved for manufacturer assistance and will get the medication through [place] and me. Patient was given the number to call to schedule shipment and will hold onto medication until she starts around 09/12/21.   Informed her we will discuss details of medication the week prior to her starting the medication.  Patient was appreciative of the phone call and stated she would set up shipment.  Oral chemotherapy clinic will continue to follow.  Drema Halon, PharmD Hematology/Oncology Clinical Pharmacist [facility] [phone] 08/17/2021   11:45 AM

## 2021-08-31 NOTE — Telephone Encounter (Addendum)
Oral Chemotherapy Pharmacist Encounter ? 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  ? ?Counseled patient on administration, dosing, side effects, monitoring, drug-food interactions, safe handling, storage, and disposal. ? ?Patient will take Lynparza $RemoveBefor'100mg'[id number]$  tablets, 1 tablet ($RemoveB'100mg'tJDOxQJc$ ) by mouth 2 times daily without regard to food. ? ?Patient counseled that administering with food may increase tolerability. ? ?Patient knows to avoid grapefruit or grapefruit juice while on therapy with Lynparza. ? ?Lonie Peak start date: 09/12/2021- patient will wait until the OK from Dr. Alvy Bimler at appt on 3/20 to start medication. ? ?Adverse effects include but are not limited to: nausea, vomiting, diarrhea, taste changes, mouth sores, fatigue, constipation, decreased blood counts, and joint pain. ?Patient informed that pneumonitis (including some fatalities) has occurred rarely. ?Myelodysplastic syndrome/acute myeloid leukemia (MDS/AML) have been reported (rarely) in clinical trials. ? ?Patient has anti-emetic on hand and knows to take it if nausea develops.   ?Patient will obtain anti diarrheal and alert the office of 4 or more loose stools above baseline. ? ?Reviewed with patient importance of keeping a medication schedule and plan for any missed doses. No barriers to medication adherence identified. ? ?Medication reconciliation performed and medication/allergy list updated. ? ?Ship broker for Lonie Peak has been obtained. ? ?Patient informed the pharmacy will reach out 5-7 days prior to needing next fill of Lynparza to coordinate continued medication acquisition to prevent break in therapy. ? ?All questions answered. ? ?Mrs. Scheier voiced understanding and appreciation.  ? ?Medication education handout placed in mail for patient.  Patient knows to call the office with questions or concerns. Oral Chemotherapy Clinic phone number provided to patient.  ? ?Drema Halon, PharmD ?Hematology/Oncology Clinical Pharmacist ?[facility] ?[phone] ?08/31/2021   11:58 AM ? ? ?

## 2021-09-08 ENCOUNTER — Ambulatory Visit (HOSPITAL_COMMUNITY)
Admission: RE | Admit: 2021-09-08 | Discharge: 2021-09-08 | Disposition: A | Discharge status: Home / Self Care | Payer: Medicare Other | Source: Ambulatory Visit | Attending: Hematology and Oncology | Admitting: Hematology and Oncology

## 2021-09-08 ENCOUNTER — Other Ambulatory Visit: Payer: Self-pay

## 2021-09-08 ENCOUNTER — Inpatient Hospital Stay: Payer: Medicare Other | Attending: Genetic Counselor

## 2021-09-08 DIAGNOSIS — M47814 Spondylosis without myelopathy or radiculopathy, thoracic region: Secondary | ICD-10-CM | POA: Diagnosis not present

## 2021-09-08 DIAGNOSIS — Z9221 Personal history of antineoplastic chemotherapy: Secondary | ICD-10-CM | POA: Insufficient documentation

## 2021-09-08 DIAGNOSIS — C786 Secondary malignant neoplasm of retroperitoneum and peritoneum: Secondary | ICD-10-CM | POA: Diagnosis not present

## 2021-09-08 DIAGNOSIS — D61818 Other pancytopenia: Secondary | ICD-10-CM | POA: Insufficient documentation

## 2021-09-08 DIAGNOSIS — C5701 Malignant neoplasm of right fallopian tube: Secondary | ICD-10-CM | POA: Insufficient documentation

## 2021-09-08 DIAGNOSIS — Z79899 Other long term (current) drug therapy: Secondary | ICD-10-CM | POA: Insufficient documentation

## 2021-09-08 DIAGNOSIS — R5383 Other fatigue: Secondary | ICD-10-CM | POA: Insufficient documentation

## 2021-09-08 DIAGNOSIS — I700 Atherosclerosis of aorta: Secondary | ICD-10-CM | POA: Diagnosis not present

## 2021-09-08 DIAGNOSIS — Z85038 Personal history of other malignant neoplasm of large intestine: Secondary | ICD-10-CM | POA: Insufficient documentation

## 2021-09-08 DIAGNOSIS — Z8543 Personal history of malignant neoplasm of ovary: Secondary | ICD-10-CM | POA: Diagnosis not present

## 2021-09-08 LAB — CMP (CANCER CENTER ONLY)
ALT: 13 U/L (ref 0–44)
AST: 19 U/L (ref 15–41)
Albumin: 4.2 g/dL (ref 3.5–5.0)
Alkaline Phosphatase: 72 U/L (ref 38–126)
Anion gap: 8 (ref 5–15)
BUN: 17 mg/dL (ref 8–23)
CO2: 24 mmol/L (ref 22–32)
Calcium: 9.8 mg/dL (ref 8.9–10.3)
Chloride: 107 mmol/L (ref 98–111)
Creatinine: 0.7 mg/dL (ref 0.44–1.00)
GFR, Estimated: >60 mL/min (ref >60)
Glucose, Bld: 94 mg/dL (ref 70–99)
Potassium: 3.7 mmol/L (ref 3.5–5.1)
Sodium: 139 mmol/L (ref 135–145)
Total Bilirubin: 0.7 mg/dL (ref 0.3–1.2)
Total Protein: 7.6 g/dL (ref 6.5–8.1)

## 2021-09-08 LAB — CBC WITH DIFFERENTIAL (CANCER CENTER ONLY)
Abs Immature Granulocytes: 0.01 10*3/uL (ref 0.00–0.07)
Basophils Absolute: 0 10*3/uL (ref 0.0–0.1)
Basophils Relative: 0 %
Eosinophils Absolute: 0 10*3/uL (ref 0.0–0.5)
Eosinophils Relative: 1 %
HCT: 31 % — ABNORMAL LOW (ref 36.0–46.0)
Hemoglobin: 10.5 g/dL — ABNORMAL LOW (ref 12.0–15.0)
Immature Granulocytes: 0 %
Lymphocytes Relative: 55 %
Lymphs Abs: 1.4 10*3/uL (ref 0.7–4.0)
MCH: 34.8 pg — ABNORMAL HIGH (ref 26.0–34.0)
MCHC: 33.9 g/dL (ref 30.0–36.0)
MCV: 102.6 fL — ABNORMAL HIGH (ref 80.0–100.0)
Monocytes Absolute: 0.3 10*3/uL (ref 0.1–1.0)
Monocytes Relative: 13 %
Neutro Abs: 0.8 10*3/uL — ABNORMAL LOW (ref 1.7–7.7)
Neutrophils Relative %: 31 %
Platelet Count: 242 10*3/uL (ref 150–400)
RBC: 3.02 MIL/uL — ABNORMAL LOW (ref 3.87–5.11)
RDW: 17.8 % — ABNORMAL HIGH (ref 11.5–15.5)
WBC Count: 2.5 10*3/uL — ABNORMAL LOW (ref 4.0–10.5)
nRBC: 0 % (ref 0.0–0.2)

## 2021-09-08 MED ORDER — SODIUM CHLORIDE (PF) 0.9 % IJ SOLN
INTRAMUSCULAR | Status: AC
  Filled 2021-09-08: qty 50

## 2021-09-08 MED ORDER — IOHEXOL 300 MG/ML  SOLN
100.0000 mL | Freq: Once | INTRAMUSCULAR | Status: AC | PRN
  Administered 2021-09-08: 100 mL via INTRAVENOUS

## 2021-09-09 LAB — CA 125: Cancer Antigen (CA) 125: 21.4 U/mL (ref 0.0–38.1)

## 2021-09-12 ENCOUNTER — Other Ambulatory Visit: Payer: Self-pay

## 2021-09-12 ENCOUNTER — Inpatient Hospital Stay: Payer: Medicare Other | Admitting: Hematology and Oncology

## 2021-09-12 ENCOUNTER — Encounter: Payer: Self-pay | Admitting: Hematology and Oncology

## 2021-09-12 DIAGNOSIS — Z7189 Other specified counseling: Secondary | ICD-10-CM

## 2021-09-12 DIAGNOSIS — D61818 Other pancytopenia: Secondary | ICD-10-CM

## 2021-09-12 DIAGNOSIS — C5701 Malignant neoplasm of right fallopian tube: Secondary | ICD-10-CM

## 2021-09-12 DIAGNOSIS — Z79899 Other long term (current) drug therapy: Secondary | ICD-10-CM | POA: Diagnosis not present

## 2021-09-12 DIAGNOSIS — I700 Atherosclerosis of aorta: Secondary | ICD-10-CM | POA: Diagnosis not present

## 2021-09-12 DIAGNOSIS — R5383 Other fatigue: Secondary | ICD-10-CM | POA: Diagnosis not present

## 2021-09-12 DIAGNOSIS — Z9221 Personal history of antineoplastic chemotherapy: Secondary | ICD-10-CM | POA: Diagnosis not present

## 2021-09-12 NOTE — Assessment & Plan Note (Signed)
CT imaging show no definitive signs of disease ?Tumor marker continues to trend down ?Due to persistent pancytopenia, she is not ready to start PARP inhibitor ?I plan to give her another 3 weeks of break ?I will see her on April 10 with repeat blood work and hopefully if her counts improved back to normal, we can start olaparib ?She is in agreement ?

## 2021-09-12 NOTE — Assessment & Plan Note (Signed)
She has persistent pancytopenia likely due to side effects from recent chemotherapy ?I plan to order iron studies and vitamin B-12 to assess ?

## 2021-09-12 NOTE — Assessment & Plan Note (Signed)
We have extensive goals of care discussions ?Quality of life is very important to her ?She complained of persistent fatigue likely due to persistent side effects of treatment ?I do not feel she is ready to start olaparib and she is in agreement ?I plan to see her again next month for further follow-up ?

## 2021-09-12 NOTE — Progress Notes (Signed)
[name] ?OFFICE PROGRESS NOTE ? ?Patient Care Team: ?Merrilee Seashore, MD as PCP - General (Internal Medicine) ?Merrilee Seashore, MD as Consulting Physician (Internal Medicine) ?Juanita Craver, MD as Consulting Physician (Gastroenterology) ? 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 ?Pancytopenia, acquired ([place]) ?She has persistent pancytopenia likely due to side effects from recent chemotherapy ?I plan to order iron studies and vitamin B-12 to assess ? ?Goals of care, counseling/discussion ?We have extensive goals of care discussions ?Quality of life is very important to her ?She complained of persistent fatigue likely due to persistent side effects of treatment ?I do not feel she is ready to start olaparib and she is in agreement ?I plan to see her again next month for further follow-up ? ?Orders Placed This Encounter  ?Procedures  ? Vitamin B12  ?  Standing Status:   Future  ?  Standing Expiration Date:   09/13/2022  ? Iron and Iron Binding Capacity ([facility] only)  ?  Standing Status:   Future  ?  Standing Expiration Date:   09/13/2022  ? Ferritin  ?  Standing Status:   Future  ?  Standing Expiration Date:   09/12/2022  ? ? ?All questions were answered. The patient knows to call the clinic with any problems, questions or concerns. ?The total time spent in the appointment was 30 minutes encounter with patients including review of chart and various tests results, discussions about plan of care and coordination of care plan ?  ?Heath Lark, MD ?09/12/2021 1:24 PM ? ?INTERVAL HISTORY: ?Please see below for problem oriented  charting. ?she returns for treatment follow-up with her sister ?She is doing well ?She complained of persistent fatigue from side effects of treatment otherwise doing well ?Her appetite is fair ?Denies abdominal pain or changes in bowel habits ? ?REVIEW OF SYSTEMS:   ?Constitutional: Denies fevers, chills or abnormal weight loss ?Eyes: Denies blurriness of vision ?Ears, nose, mouth, throat, and face: Denies mucositis or sore throat ?Respiratory: Denies cough, dyspnea or wheezes ?Cardiovascular: Denies palpitation, chest discomfort or lower extremity swelling ?Gastrointestinal:  Denies nausea, heartburn or change in bowel habits ?Skin: Denies abnormal skin rashes ?Lymphatics: Denies new lymphadenopathy or easy bruising ?Neurological:Denies numbness, tingling or new weaknesses ?Behavioral/Psych: Mood is stable, no new changes  ?All other systems were reviewed with the patient and are negative. ? ?I have reviewed the past medical history, past surgical history, social history and family history with the patient and they are unchanged from previous note. ? ?ALLERGIES:  is allergic to barium-containing compounds, hydrocodone, erythromycin, and zofran [ondansetron]. ? ?MEDICATIONS:  ?Current Outpatient Medications  ?Medication Sig Dispense Refill  ? Calcium Carb-Cholecalciferol (CALCIUM 600+D3 PO) Take 1 tablet by mouth in the morning and at bedtime.    ? cetirizine (ZYRTEC) 10 MG tablet Take 10 mg by mouth every 30 (thirty) days. As needed    ? cholecalciferol (VITAMIN D3) 25 MCG (1000 UNIT) tablet Take 1,000 Units by mouth in the morning.    ? diazepam (VALIUM) 5 MG tablet Take 5 mg by mouth daily as needed for anxiety.    ?  escitalopram (LEXAPRO) 10 MG tablet Take 1.5 tablets (15 mg total) by mouth daily. 45 tablet 3  ? levothyroxine (SYNTHROID, LEVOTHROID) 75 MCG tablet Take 75 mcg by mouth daily before breakfast.  3  ? olaparib (LYNPARZA) 100 MG tablet Take 1 tablet (100 mg total) by mouth 2 (two) times daily.  Swallow whole. May take with food to decrease nausea and vomiting. 60 tablet 11  ? ondansetron (ZOFRAN) 8 MG tablet Take 1 tablet (8 mg total) by mouth every 8 (eight) hours as needed for refractory nausea / vomiting. (Patient not taking: Reported on 04/28/2021) 30 tablet 1  ? oxyCODONE-acetaminophen (PERCOCET/ROXICET) 5-325 MG tablet Take 1 tablet by mouth every 6 (six) hours as needed for severe pain. 30 tablet 0  ? Polyethyl Glycol-Propyl Glycol (SYSTANE) 0.4-0.3 % SOLN Place 1-2 drops into both eyes 3 (three) times daily as needed (dry/irritated eyes.).    ? prochlorperazine (COMPAZINE) 10 MG tablet Take 1 tablet (10 mg total) by mouth every 6 (six) hours as needed (Nausea or vomiting). (Patient not taking: Reported on 04/28/2021) 30 tablet 1  ? valACYclovir (VALTREX) 1000 MG tablet Take 1,000 mg by mouth daily as needed (Fever Blister).    ? zolpidem (AMBIEN) 10 MG tablet Take 1 tablet (10 mg total) by mouth at bedtime as needed. for sleep 30 tablet 2  ? ?No current facility-administered medications for this visit.  ? ? ?SUMMARY OF ONCOLOGIC HISTORY: ?Oncology History Overview Note  ?Normal MMR ?HRD positive ?  ?Cancer of right fallopian tube [facility])  ?01/05/2021 Pathology Results  ? SURGICAL PATHOLOGY  ?CASE: [id number]  ?PATIENT: Leslie Rivera  ?Surgical Pathology Report  ? ?Reason for Addendum #1:  Immunohistochemistry results  ?Reason for Addendum #2:  DNA Mismatch Repair IHC Results  ? ?Clinical History: history of colon cancer, now with omental thickening  ?(cm)  ? ?FINAL MICROSCOPIC DIAGNOSIS:  ? ?A. OMENTUM, NEEDLE CORE BIOPSY:  ?- Metastatic poorly differentiated adenocarcinoma.  See comment  ? ?COMMENT:  ? ?Immunohistochemical stains show that the tumor cells are positive for CK7 and negative for CDX2 and CK20.  This immunoprofile is nonspecific and differential diagnosis can include an upper gastrointestinal, breast and lung primary among other possibilities.  Only a very small fraction of primary  colonic adenocarcinoma was immunoprofile.  Clinical and radiologic correlation suggested.  ? ?Additional immunohistochemical stains are performed and show that the tumor cells are positive for WT1, PAX8 and ER.  Immunostain for p53 shows a clonal overexpression pattern.  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  Clinical and radiologic correlation is suggested.  ?  ?01/19/2021 Tumor Marker  ? Patient's tumor was tested for the following markers: CA-125. ?Results of the tumor marker test revealed [id number]. ?  ?01/28/2021 PET scan  ? 1. Examination is positive for extensive FDG avid peritoneal disease within the abdomen and pelvis including multiple implants upon the surface of liver and extensive omental caking. Moderate ascites noted within the pelvis. ?2. FDG avid retroperitoneal, retrocrural, right internal mammary, and mediastinal lymph nodes compatible with metastatic adenopathy. ?3. Small right pleural effusion. ?4.  Aortic Atherosclerosis (ICD10-I70.0). ?  ?  ?01/31/2021 Initial Diagnosis  ? Primary peritoneal carcinomatosis ([place]) ?  ?01/31/2021 Cancer Staging  ? Staging form: Ovary, Fallopian Tube, and Primary Peritoneal Carcinoma, AJCC 8th Edition ?- Clinical stage from 01/31/2021: FIGO Stage IV (cT2b, cN1b, cM1) - Signed by Heath Lark, MD on 01/31/2021 ?Stage prefix: Initial diagnosis ? ?  ?02/14/2021  Tumor Marker  ? Patient's tumor was tested for the following markers: CA-125. ?Results of the tumor marker test revealed [id number]. ?  ?02/16/2021 - 08/11/2021 Chemotherapy  ? Patient is on Treatment Plan : OVARIAN Carboplatin   ?   ? Genetic Testing  ? 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 Remainder of testing was negative/normal. The report date is 02/09/2021. ? ?The CancerNext-Expanded + RNAinsight gene panel offered by [employer] and includes sequencing and rearrangement analysis for the  following 77 genes: IP, ALK, APC*, ATM*, AXIN2, BAP1, BARD1, BLM, BMPR1A, BRCA1*, BRCA2*, BRIP1*, CDC73, CDH1*,CDK4, CDKN1B, CDKN2A, CHEK2*, CTNNA1, DICER1, FANCC, FH, FLCN, GALNT12, KIF1B, LZTR1, MAX, MEN1, MET, MLH1*, MSH2*,

## 2021-09-22 DIAGNOSIS — H04123 Dry eye syndrome of bilateral lacrimal glands: Secondary | ICD-10-CM | POA: Diagnosis not present

## 2021-09-22 DIAGNOSIS — Z961 Presence of intraocular lens: Secondary | ICD-10-CM | POA: Diagnosis not present

## 2021-10-03 ENCOUNTER — Inpatient Hospital Stay: Payer: Medicare Other | Admitting: Hematology and Oncology

## 2021-10-03 ENCOUNTER — Other Ambulatory Visit: Payer: Self-pay

## 2021-10-03 ENCOUNTER — Encounter: Payer: Self-pay | Admitting: Hematology and Oncology

## 2021-10-03 ENCOUNTER — Inpatient Hospital Stay: Payer: Medicare Other | Attending: Genetic Counselor

## 2021-10-03 ENCOUNTER — Telehealth: Payer: Self-pay | Admitting: Oncology

## 2021-10-03 DIAGNOSIS — I700 Atherosclerosis of aorta: Secondary | ICD-10-CM | POA: Diagnosis not present

## 2021-10-03 DIAGNOSIS — E538 Deficiency of other specified B group vitamins: Secondary | ICD-10-CM

## 2021-10-03 DIAGNOSIS — Z79899 Other long term (current) drug therapy: Secondary | ICD-10-CM | POA: Insufficient documentation

## 2021-10-03 DIAGNOSIS — D61818 Other pancytopenia: Secondary | ICD-10-CM

## 2021-10-03 DIAGNOSIS — C482 Malignant neoplasm of peritoneum, unspecified: Secondary | ICD-10-CM

## 2021-10-03 DIAGNOSIS — C786 Secondary malignant neoplasm of retroperitoneum and peritoneum: Secondary | ICD-10-CM | POA: Insufficient documentation

## 2021-10-03 DIAGNOSIS — C5701 Malignant neoplasm of right fallopian tube: Secondary | ICD-10-CM | POA: Diagnosis not present

## 2021-10-03 LAB — CBC WITH DIFFERENTIAL (CANCER CENTER ONLY)
Abs Immature Granulocytes: 0 10*3/uL (ref 0.00–0.07)
Basophils Absolute: 0 10*3/uL (ref 0.0–0.1)
Basophils Relative: 1 %
Eosinophils Absolute: 0.1 10*3/uL (ref 0.0–0.5)
Eosinophils Relative: 2 %
HCT: 35.8 % — ABNORMAL LOW (ref 36.0–46.0)
Hemoglobin: 11.7 g/dL — ABNORMAL LOW (ref 12.0–15.0)
Immature Granulocytes: 0 %
Lymphocytes Relative: 43 %
Lymphs Abs: 1.4 10*3/uL (ref 0.7–4.0)
MCH: 35.1 pg — ABNORMAL HIGH (ref 26.0–34.0)
MCHC: 32.7 g/dL (ref 30.0–36.0)
MCV: 107.5 fL — ABNORMAL HIGH (ref 80.0–100.0)
Monocytes Absolute: 0.3 10*3/uL (ref 0.1–1.0)
Monocytes Relative: 10 %
Neutro Abs: 1.4 10*3/uL — ABNORMAL LOW (ref 1.7–7.7)
Neutrophils Relative %: 44 %
Platelet Count: 284 10*3/uL (ref 150–400)
RBC: 3.33 MIL/uL — ABNORMAL LOW (ref 3.87–5.11)
RDW: 14.6 % (ref 11.5–15.5)
WBC Count: 3.2 10*3/uL — ABNORMAL LOW (ref 4.0–10.5)
nRBC: 0 % (ref 0.0–0.2)

## 2021-10-03 LAB — CMP (CANCER CENTER ONLY)
ALT: 12 U/L (ref 0–44)
AST: 17 U/L (ref 15–41)
Albumin: 4.2 g/dL (ref 3.5–5.0)
Alkaline Phosphatase: 77 U/L (ref 38–126)
Anion gap: 7 (ref 5–15)
BUN: 13 mg/dL (ref 8–23)
CO2: 25 mmol/L (ref 22–32)
Calcium: 9.7 mg/dL (ref 8.9–10.3)
Chloride: 107 mmol/L (ref 98–111)
Creatinine: 0.75 mg/dL (ref 0.44–1.00)
GFR, Estimated: >60 mL/min (ref >60)
Glucose, Bld: 82 mg/dL (ref 70–99)
Potassium: 4.1 mmol/L (ref 3.5–5.1)
Sodium: 139 mmol/L (ref 135–145)
Total Bilirubin: 0.4 mg/dL (ref 0.3–1.2)
Total Protein: 7.6 g/dL (ref 6.5–8.1)

## 2021-10-03 LAB — FERRITIN: Ferritin: 89 ng/mL (ref 11–307)

## 2021-10-03 LAB — IRON AND IRON BINDING CAPACITY (CC-WL,HP ONLY)
Iron: 53 ug/dL (ref 28–170)
Saturation Ratios: 15 % (ref 10.4–31.8)
TIBC: 344 ug/dL (ref 250–450)
UIBC: 291 ug/dL (ref 148–442)

## 2021-10-03 LAB — VITAMIN B12: Vitamin B-12: 113 pg/mL — ABNORMAL LOW (ref 180–914)

## 2021-10-03 NOTE — Assessment & Plan Note (Signed)
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 but much improved, almost back to baseline ?As above, I plan to give her a few more days to recover from recent dental extraction before we start her treatment this weekend ?She does not need further evaluation or follow-up in terms of pancytopenia ?She is aware that the dose of olaparib is slightly reduced dose ?

## 2021-10-03 NOTE — Assessment & Plan Note (Signed)
The patient is later found to have vitamin B12 deficiency and that could explain her persistent pancytopenia ?She is in agreement to get vitamin B12 injection ?I will get schedule her to add vitamin B12 injection to her future visits ?I recommend B12 injection x2 this month and then again next month when I see her ?

## 2021-10-03 NOTE — Telephone Encounter (Signed)
OK I will put in orders ?

## 2021-10-03 NOTE — Assessment & Plan Note (Signed)
We discussed the role of olaparib ?I will give her a few more days to recover from recent dental extraction and mild pancytopenia ?I recommend she start taking olaparib at 100 mg twice daily on April 15 ?I will get her scheduled to see my colleague, Jenny Reichmann for evaluation and management of toxicity ?I will see her next month for further follow-up ?The risks, benefits and side effects of olaparib is fully discussed with the patient and she is in agreement to proceed ?

## 2021-10-03 NOTE — Progress Notes (Addendum)
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[facility]) We discussed the role of olaparib I will give her a few more days to recover from recent dental extraction and mild pancytopenia I recommend she start taking olaparib at 100 mg twice daily on April 15 I will get her scheduled to see my colleague, Jonny Ruiz for evaluation and management of toxicity I will see her next month for further follow-up The risks, benefits and side effects of olaparib is fully discussed with the patient and she is in agreement to proceed  Pancytopenia, acquired [facility]) 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 but much improved, almost back to baseline As above, I plan to give her a few more days to recover from recent dental extraction before we start her treatment this weekend She does not need further evaluation or follow-up in terms of pancytopenia She is aware that the dose of olaparib is slightly reduced dose  Vitamin B12 deficiency The patient is later found to have vitamin B12 deficiency and that could explain her persistent pancytopenia She is in agreement to get vitamin B12 injection I will get schedule her to add vitamin B12 injection to her future visits I recommend B12 injection x2 this month and then again next month when I see her  No orders of the defined types were placed in this encounter.   All questions were answered. The patient knows to call the clinic with any problems, questions or concerns. The total time spent in the appointment was 20 minutes encounter with patients including review of chart and various tests results, discussions about plan of care and coordination of care plan   Artis Delay, MD 10/03/2021 1:01  PM  INTERVAL HISTORY: Please see below for problem oriented charting. she returns for treatment follow-up with her sister She felt much better since last time I saw her Her energy level is almost 80% of her baseline Recent dental extraction a week ago and is still recovering from it but overall much improved No recent fever or chills  REVIEW OF SYSTEMS:   Constitutional: Denies fevers, chills or abnormal weight loss Eyes: Denies blurriness of vision Ears, nose, mouth, throat, and face: Denies mucositis or sore throat Respiratory: Denies cough, dyspnea or wheezes Cardiovascular: Denies palpitation, chest discomfort or lower extremity swelling Gastrointestinal:  Denies nausea, heartburn or change in bowel habits Skin: Denies abnormal skin rashes Lymphatics: Denies new lymphadenopathy or easy bruising Neurological:Denies numbness, tingling or new weaknesses Behavioral/Psych: Mood is stable, no new changes  All other systems were reviewed with the patient and are negative.  I have reviewed the past medical history, past surgical history, social history and family history with the patient and they are unchanged from previous note.  ALLERGIES:  is allergic to barium-containing compounds, hydrocodone, erythromycin, and zofran [ondansetron].  MEDICATIONS:  Current Outpatient Medications  Medication Sig Dispense Refill   Calcium Carb-Cholecalciferol (CALCIUM 600+D3 PO) Take 1 tablet by mouth in the morning and at bedtime.     cetirizine (ZYRTEC) 10 MG tablet Take 10 mg by mouth every 30 (thirty) days. As needed     cholecalciferol (VITAMIN D3) 25 MCG (1000 UNIT) tablet Take 1,000 Units by mouth in the morning.     diazepam (VALIUM) 5 MG tablet Take 5 mg by mouth daily  as needed for anxiety.     escitalopram (LEXAPRO) 10 MG tablet Take 1.5 tablets (15 mg total) by mouth daily. 45 tablet 3   levothyroxine (SYNTHROID, LEVOTHROID) 75 MCG tablet Take 75 mcg by mouth daily before breakfast.  3    olaparib (LYNPARZA) 100 MG tablet Take 1 tablet (100 mg total) by mouth 2 (two) times daily. Swallow whole. May take with food to decrease nausea and vomiting. 60 tablet 11   ondansetron (ZOFRAN) 8 MG tablet Take 1 tablet (8 mg total) by mouth every 8 (eight) hours as needed for refractory nausea / vomiting. (Patient not taking: Reported on 04/28/2021) 30 tablet 1   oxyCODONE-acetaminophen (PERCOCET/ROXICET) 5-325 MG tablet Take 1 tablet by mouth every 6 (six) hours as needed for severe pain. 30 tablet 0   Polyethyl Glycol-Propyl Glycol (SYSTANE) 0.4-0.3 % SOLN Place 1-2 drops into both eyes 3 (three) times daily as needed (dry/irritated eyes.).     prochlorperazine (COMPAZINE) 10 MG tablet Take 1 tablet (10 mg total) by mouth every 6 (six) hours as needed (Nausea or vomiting). (Patient not taking: Reported on 04/28/2021) 30 tablet 1   valACYclovir (VALTREX) 1000 MG tablet Take 1,000 mg by mouth daily as needed (Fever Blister).     zolpidem (AMBIEN) 10 MG tablet Take 1 tablet (10 mg total) by mouth at bedtime as needed. for sleep 30 tablet 2   No current facility-administered medications for this visit.    SUMMARY OF ONCOLOGIC HISTORY: Oncology History Overview Note  Normal MMR HRD positive   Cancer of right fallopian tube (HCC)  01/05/2021 Pathology Results   SURGICAL PATHOLOGY  CASE: [phone]  PATIENT: [facility]  Surgical Pathology Report   Reason for Addendum #1:  Immunohistochemistry results  Reason for Addendum #2:  DNA Mismatch Repair IHC Results   Clinical History: history of colon cancer, now with omental thickening  (cm)   FINAL MICROSCOPIC DIAGNOSIS:   A. OMENTUM, NEEDLE CORE BIOPSY:  - Metastatic poorly differentiated adenocarcinoma.  See comment   COMMENT:   Immunohistochemical stains show that the tumor cells are positive for CK7 and negative for CDX2 and CK20.  This immunoprofile is nonspecific and differential diagnosis can include an upper gastrointestinal,  breast and lung primary among other possibilities.  Only a very small fraction of primary colonic adenocarcinoma was immunoprofile.  Clinical and radiologic correlation suggested.   Additional immunohistochemical stains are performed and show that the tumor cells are positive for WT1, PAX8 and ER.  Immunostain for p53 shows a clonal overexpression pattern.  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  Clinical and radiologic correlation is suggested.    01/19/2021 Tumor Marker   Patient's tumor was tested for the following markers: CA-125. Results of the tumor marker test revealed [id number].   01/28/2021 PET scan   1. Examination is positive for extensive FDG avid peritoneal disease within the abdomen and pelvis including multiple implants upon the surface of liver and extensive omental caking. Moderate ascites noted within the pelvis. 2. FDG avid retroperitoneal, retrocrural, right internal mammary, and mediastinal lymph nodes compatible with metastatic adenopathy. 3. Small right pleural effusion. 4.  Aortic Atherosclerosis (ICD10-I70.0).     01/31/2021 Initial Diagnosis   Primary peritoneal carcinomatosis (HCC)   01/31/2021 Cancer Staging   Staging form: Ovary, Fallopian Tube, and Primary Peritoneal Carcinoma, AJCC 8th Edition - Clinical stage from 01/31/2021: FIGO Stage IV (cT2b, cN1b, cM1) - Signed by Artis Delay, MD on 01/31/2021  Stage prefix: Initial diagnosis    02/14/2021 Tumor Marker   Patient's tumor was tested for the following markers: CA-125. Results of the tumor marker test revealed 8668.   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 Remainder of testing was negative/normal. The report date is 02/09/2021.  The CancerNext-Expanded + RNAinsight gene panel  offered by [employer] and includes sequencing and rearrangement analysis for the following 77 genes: IP, ALK, APC*, ATM*, AXIN2, BAP1, BARD1, BLM, BMPR1A, BRCA1*, BRCA2*, BRIP1*, CDC73, CDH1*,CDK4, CDKN1B, CDKN2A, CHEK2*, CTNNA1, DICER1, FANCC, FH, FLCN, GALNT12, KIF1B, LZTR1, MAX, MEN1, MET, MLH1*, MSH2*, MSH3, MSH6*, MUTYH*, NBN, NF1*, NF2, NTHL1, PALB2*, PHOX2B, PMS2*, POT1, PRKAR1A, PTCH1, PTEN*, RAD51C*, RAD51D*,RB1, RECQL, RET, SDHA, SDHAF2, SDHB, SDHC, SDHD, SMAD4, SMARCA4, SMARCB1, SMARCE1, STK11, SUFU, TMEM127, TP53*,TSC1, TSC2, VHL and XRCC2 (sequencing and deletion/duplication); EGFR, EGLN1, HOXB13, KIT, MITF, PDGFRA, POLD1 and POLE (sequencing only); EPCAM and GREM1 (deletion/duplication only).   03/10/2021 Tumor Marker   Patient's tumor was tested for the following markers: CA-125. Results of the tumor marker test revealed 2002.   04/14/2021 Tumor Marker   Patient's tumor was tested for the following markers: CA-125. Results of the tumor marker test revealed 278.   04/26/2021 Imaging   Significant decrease in peritoneal carcinomatosis since previous study, with resolution of ascites.   Decreased anterior mediastinal and retrocrural lymphadenopathy.   No new or progressive metastatic disease within the chest, abdomen, or pelvis.   Aortic Atherosclerosis (ICD10-I70.0).   04/29/2021 Tumor Marker   Patient's tumor was tested for the following markers: CA-125. Results of the tumor marker test revealed 134.   05/12/2021 Surgery   Tumor debulking including robotic-assisted laparoscopic total hysterectomy with bilateral salpingoophorectomy, lysis of adhesions, excision and fulguration of peritoneal nodules, mini laparotomy for intra-abdominal palpation and omentectomy  Findings: : On exam under anesthesia, small mobile uterus.  On intra-abdominal entry, normal upper abdominal survey including liver and stomach.  Right diaphragm with small peritoneal studding, all lesions less than 5  mm.  Omentum with an approximately 5 cm mass adherent to the lower anterior abdominal wall and bladder peritoneum.  Normal 6 cm uterus.  Somewhat atrophic appearing bilateral adnexa with tumor implants.  Sigmoid and rectal colon and mesentery with moderate peritoneal disease, all lesions less than 5 mm.  Multiple peritoneal implants on bilateral pelvic sidewalls and in the posterior cul-de-sac, all measuring less than 5 mm.  Multiple omental implants measuring several mm.  Approximately 1 cm area within the transverse colon mesentery, unclear if this is related to scar tissue from prior colon resection versus tumor implant.  No obvious pelvic adenopathy, no palpable para-aortic adenopathy. R1 resection at the end of surgery   05/12/2021 Pathology Results   A. UTERUS, CERVIX, BILATERAL FALLOPIAN TUBES AND OVARIES:  - Fallopian tubes:       - Bilateral fallopian tubes involved by high-grade serous  carcinoma.  - Ovaries:       - Bilateral ovaries involved by high-grade serous carcinoma.  - Uterine cervix:       - Benign transformation zone.       - Negative for squamous intraepithelial lesion and malignancy.  - Endometrium:       - Inactive endometrium.       - Negative for atypical hyperplasia/EIN and malignancy.  - Myometrium:       -  Negative for malignancy.  - Uterine serosa:       - Involved by metastatic high-grade serous carcinoma.   B. PERITONEAL IMPLANT, EXCISION:  - Metastatic high-grade serous carcinoma.   C. OMENTUM, RESECTION:  - Metastatic high-grade serous carcinoma, multifocal.   COMMENT:   A. Immunohistochemical studies show tumor cells to be positive for CK7,  Pax-8, and WT-1, and negative for CK20, PR, and CDX-2. These findings  support the above diagnosis. A p53 shows strong and diffuse staining  within neoplastic cells, indicating mutated p53 status.   OVARY or FALLOPIAN TUBE or PRIMARY PERITONEUM: Resection   Procedure: Total hysterectomy and bilateral  salpingo-oophorectomy  Specimen Integrity: Intact  Tumor Site: Favor right fallopian tube  Tumor Size: At least 1.7 cm  Histologic Type: High-grade serous carcinoma  Histologic Grade: High-grade  Ovarian Surface Involvement: Present, right and left  Fallopian Tube Surface Involvement: Present, right and left  Implants (required for advanced stage serous/seromucinous borderline  tumors only): Present: Peritoneal and omental  Other Tissue/ Organ Involvement: Bilateral ovaries, uterine serosa,  omentum, pelvic peritoneum  Largest Extrapelvic Peritoneal Focus: At least 6.7 cm  Peritoneal/Ascitic Fluid Involvement: Not submitted/unknown  Chemotherapy Response Score (CRS): CRS 1 (no definite or minimal  response)  Regional Lymph Nodes: Not applicable (no lymph nodes submitted or found)   Distant Metastasis:       Distant Site(s) Involved: Not applicable  Pathologic Stage Classification (pTNM, AJCC 8th Edition): ypT3c, pN not  assigned  TNM classifiers: y (posttreatment)  Ancillary Studies: Can be performed upon request  Representative Tumor Block: A4  Comment(s): None    06/30/2021 Tumor Marker   Patient's tumor was tested for the following markers: CA-125. Results of the tumor marker test revealed 72.8.   08/11/2021 Tumor Marker   Patient's tumor was tested for the following markers: Ca-125. Results of the tumor marker test revealed 30.7.   09/08/2021 Tumor Marker   Patient's tumor was tested for the following markers: CA-125. Results of the tumor marker test revealed 21.4.   09/09/2021 Imaging   1. No new or progressive metastatic disease in the chest abdomen or pelvis. 2. Continued interval decrease in omental caking now with minimal residual nodular stranding. 3. Similar to minimally decreased mediastinal adenopathy. 4. Aortic Atherosclerosis (ICD10-I70.0).     PHYSICAL EXAMINATION: ECOG PERFORMANCE STATUS: 1 - Symptomatic but completely ambulatory  Vitals:   10/03/21 1020   BP: 134/76  Pulse: 61  Resp: 18  Temp: 97.7 F (36.5 C)  SpO2: 100%   Filed Weights   10/03/21 1020  Weight: 158 lb 9.6 oz (71.9 kg)    GENERAL:alert, no distress and comfortable NEURO: alert & oriented x 3 with fluent speech, no focal motor/sensory deficits  LABORATORY DATA:  I have reviewed the data as listed    Component Value Date/Time   NA 139 10/03/2021 1001   K 4.1 10/03/2021 1001   CL 107 10/03/2021 1001   CO2 25 10/03/2021 1001   GLUCOSE 82 10/03/2021 1001   BUN 13 10/03/2021 1001   CREATININE 0.75 10/03/2021 1001   CREATININE 0.68 03/14/2016 1350   CALCIUM 9.7 10/03/2021 1001   PROT 7.6 10/03/2021 1001   ALBUMIN 4.2 10/03/2021 1001   AST 17 10/03/2021 1001   ALT 12 10/03/2021 1001   ALKPHOS 77 10/03/2021 1001   BILITOT 0.4 10/03/2021 1001   GFRNONAA >60 10/03/2021 1001   GFRAA >60 03/19/2016 0455    No results found for: SPEP, UPEP  Lab Results  Component  Value Date   WBC 3.2 (L) 10/03/2021   NEUTROABS 1.4 (L) 10/03/2021   HGB 11.7 (L) 10/03/2021   HCT 35.8 (L) 10/03/2021   MCV 107.5 (H) 10/03/2021   PLT 284 10/03/2021      Chemistry      Component Value Date/Time   NA 139 10/03/2021 1001   K 4.1 10/03/2021 1001   CL 107 10/03/2021 1001   CO2 25 10/03/2021 1001   BUN 13 10/03/2021 1001   CREATININE 0.75 10/03/2021 1001   CREATININE 0.68 03/14/2016 1350      Component Value Date/Time   CALCIUM 9.7 10/03/2021 1001   ALKPHOS 77 10/03/2021 1001   AST 17 10/03/2021 1001   ALT 12 10/03/2021 1001   BILITOT 0.4 10/03/2021 1001       RADIOGRAPHIC STUDIES: I have personally reviewed the radiological images as listed and agreed with the findings in the report. CT CHEST ABDOMEN PELVIS W CONTRAST  Result Date: 09/09/2021 CLINICAL DATA:  History of ovarian cancer with peritoneal carcinomatosis, completed chemotherapy. Follow-up EXAM: CT CHEST, ABDOMEN, AND PELVIS WITH CONTRAST TECHNIQUE: Multidetector CT imaging of the chest, abdomen and pelvis  was performed following the standard protocol during bolus administration of intravenous contrast. RADIATION DOSE REDUCTION: This exam was performed according to the departmental dose-optimization program which includes automated exposure control, adjustment of the mA and/or kV according to patient size and/or use of iterative reconstruction technique. CONTRAST:  OMNIPAQUE IOHEXOL 300 MG/ML  SOLN COMPARISON:  Multiple priors including most recent CT April 26, 2021 FINDINGS: CT CHEST FINDINGS Cardiovascular: Aortic atherosclerosis without aneurysmal dilation. No central pulmonary embolus on this nondedicated study. Normal size heart. Trace pericardial effusion similar prior and within physiologic normal limits. Mediastinum/Nodes: No discrete thyroid nodule. Similar to minimally decreased mediastinal adenopathy with the previously indexed mediastinal lymph node measuring 8 mm on image 17/2 previously 9 mm. No new or enlarging mediastinal, hilar or axillary lymph nodes. Esophagus is grossly unremarkable. Lungs/Pleura: biapical pleuroparenchymal scarring similar prior. No suspicious pulmonary nodules or masses. No pleural effusion. No pneumothorax. Musculoskeletal: Thoracic spondylosis. No aggressive lytic or blastic lesion of bone. No suspicious chest wall mass. CT ABDOMEN PELVIS FINDINGS Hepatobiliary: No suspicious hepatic lesion. Gallbladder is unremarkable. No biliary ductal dilation. Pancreas: No pancreatic ductal dilation or evidence of acute inflammation. Spleen: No splenomegaly or focal splenic lesion. Adrenals/Urinary Tract: Bilateral adrenal glands appear normal. No hydronephrosis. Kidneys demonstrate symmetric enhancement and excretion of contrast material. No solid enhancing renal mass. Urinary bladder is unremarkable for degree of distension. Stomach/Bowel: No enteric contrast was administered. Stomach is unremarkable for degree of distension. No pathologic dilation of large or small bowel.  Surgical changes of prior right hemicolectomy. No evidence of acute bowel inflammation. Vascular/Lymphatic: No pathologically enlarged abdominal or pelvic lymph nodes. Scattered aortic atherosclerosis without abdominal aortic aneurysm. Reproductive: Prior hysterectomy without suspicious nodularity along the vaginal cuff. No suspicious adnexal mass. Other: No significant abdominopelvic free fluid. Continued interval decrease in the omental caking now with minimal nodular stranding for instance on image 77/2. No new discrete peritoneal nodularity. Musculoskeletal: No aggressive lytic or blastic lesion of bone. Multilevel degenerative changes spine. IMPRESSION: 1. No new or progressive metastatic disease in the chest abdomen or pelvis. 2. Continued interval decrease in omental caking now with minimal residual nodular stranding. 3. Similar to minimally decreased mediastinal adenopathy. 4. Aortic Atherosclerosis (ICD10-I70.0). Electronically Signed   By: Maudry Mayhew M.D.   On: 09/09/2021 11:06

## 2021-10-03 NOTE — Telephone Encounter (Signed)
Leslie Rivera called back and was advised of her low B12 level.  Discussed if she would like an injection or to take over the counter B12.  She would like to proceed with a B12 injection while she is here on 10/17/21.  Advised her that a scheduler will call her with the injection appointment time. ?

## 2021-10-03 NOTE — Telephone Encounter (Signed)
Left a message to discuss B12 results and treatment options. Requested a return call. ?

## 2021-10-03 NOTE — Addendum Note (Signed)
Addended byAlvy Bimler, Lamount Bankson on: 10/03/2021 01:01 PM ? ? Modules accepted: Orders, Level of Service ? ?

## 2021-10-04 ENCOUNTER — Encounter: Payer: Self-pay | Admitting: Hematology and Oncology

## 2021-10-04 LAB — CA 125: Cancer Antigen (CA) 125: 22.2 U/mL (ref 0.0–38.1)

## 2021-10-17 ENCOUNTER — Inpatient Hospital Stay: Payer: Medicare Other

## 2021-10-17 ENCOUNTER — Telehealth: Payer: Self-pay | Admitting: Pharmacist

## 2021-10-17 ENCOUNTER — Other Ambulatory Visit: Payer: Self-pay | Admitting: Hematology and Oncology

## 2021-10-17 ENCOUNTER — Inpatient Hospital Stay: Payer: Medicare Other | Admitting: Pharmacist

## 2021-10-17 ENCOUNTER — Other Ambulatory Visit: Payer: Self-pay

## 2021-10-17 VITALS — BP 131/92 | HR 63 | Temp 97.4°F | Resp 18 | Ht 66.0 in | Wt 157.1 lb

## 2021-10-17 DIAGNOSIS — C5701 Malignant neoplasm of right fallopian tube: Secondary | ICD-10-CM | POA: Diagnosis not present

## 2021-10-17 DIAGNOSIS — D61818 Other pancytopenia: Secondary | ICD-10-CM | POA: Diagnosis not present

## 2021-10-17 DIAGNOSIS — C786 Secondary malignant neoplasm of retroperitoneum and peritoneum: Secondary | ICD-10-CM | POA: Diagnosis not present

## 2021-10-17 DIAGNOSIS — Z79899 Other long term (current) drug therapy: Secondary | ICD-10-CM | POA: Diagnosis not present

## 2021-10-17 DIAGNOSIS — E538 Deficiency of other specified B group vitamins: Secondary | ICD-10-CM

## 2021-10-17 DIAGNOSIS — C482 Malignant neoplasm of peritoneum, unspecified: Secondary | ICD-10-CM

## 2021-10-17 DIAGNOSIS — I700 Atherosclerosis of aorta: Secondary | ICD-10-CM | POA: Diagnosis not present

## 2021-10-17 LAB — CMP (CANCER CENTER ONLY)
ALT: 11 U/L (ref 0–44)
AST: 15 U/L (ref 15–41)
Albumin: 4.2 g/dL (ref 3.5–5.0)
Alkaline Phosphatase: 75 U/L (ref 38–126)
Anion gap: 6 (ref 5–15)
BUN: 18 mg/dL (ref 8–23)
CO2: 24 mmol/L (ref 22–32)
Calcium: 9.9 mg/dL (ref 8.9–10.3)
Chloride: 109 mmol/L (ref 98–111)
Creatinine: 0.88 mg/dL (ref 0.44–1.00)
GFR, Estimated: >60 mL/min (ref >60)
Glucose, Bld: 99 mg/dL (ref 70–99)
Potassium: 4.9 mmol/L (ref 3.5–5.1)
Sodium: 139 mmol/L (ref 135–145)
Total Bilirubin: 0.5 mg/dL (ref 0.3–1.2)
Total Protein: 7.5 g/dL (ref 6.5–8.1)

## 2021-10-17 LAB — CBC WITH DIFFERENTIAL (CANCER CENTER ONLY)
Abs Immature Granulocytes: 0 10*3/uL (ref 0.00–0.07)
Basophils Absolute: 0 10*3/uL (ref 0.0–0.1)
Basophils Relative: 1 %
Eosinophils Absolute: 0.1 10*3/uL (ref 0.0–0.5)
Eosinophils Relative: 2 %
HCT: 37.4 % (ref 36.0–46.0)
Hemoglobin: 12.1 g/dL (ref 12.0–15.0)
Immature Granulocytes: 0 %
Lymphocytes Relative: 42 %
Lymphs Abs: 1.6 10*3/uL (ref 0.7–4.0)
MCH: 35.3 pg — ABNORMAL HIGH (ref 26.0–34.0)
MCHC: 32.4 g/dL (ref 30.0–36.0)
MCV: 109 fL — ABNORMAL HIGH (ref 80.0–100.0)
Monocytes Absolute: 0.4 10*3/uL (ref 0.1–1.0)
Monocytes Relative: 10 %
Neutro Abs: 1.7 10*3/uL (ref 1.7–7.7)
Neutrophils Relative %: 45 %
Platelet Count: 276 10*3/uL (ref 150–400)
RBC: 3.43 MIL/uL — ABNORMAL LOW (ref 3.87–5.11)
RDW: 12.7 % (ref 11.5–15.5)
WBC Count: 3.8 10*3/uL — ABNORMAL LOW (ref 4.0–10.5)
nRBC: 0 % (ref 0.0–0.2)

## 2021-10-17 MED ORDER — CYANOCOBALAMIN 1000 MCG/ML IJ SOLN
1000.0000 ug | Freq: Once | INTRAMUSCULAR | Status: AC
  Administered 2021-10-17: 1000 ug via INTRAMUSCULAR
  Filled 2021-10-17: qty 1

## 2021-10-17 MED ORDER — ZOLPIDEM TARTRATE 10 MG PO TABS: 10.0000 mg | ORAL_TABLET | Freq: Every evening | ORAL | 2 refills | Status: DC | PRN

## 2021-10-17 NOTE — Progress Notes (Signed)
?[place]  ?     Telephone: [phone]?Fax: [phone]  ? ?Oncology Clinical Pharmacist Practitioner Initial Assessment ? ?Leslie Rivera is a 76 y.o. female with a diagnosis of breast cancer. They were contacted today via in-person visit. ? 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 Heath Lark.  ?  ?  ?Wt Readings from Last 1 Encounters:  ?10/17/21 157 lb 1.6 oz (71.3 kg)  ? ? Estimated body surface area is 1.82 meters squared as calculated from the following: ?  Height as of this encounter: '5\' 6"'$  (1.676 m). ?  Weight as of this encounter: 157 lb 1.6 oz (71.3 kg). ? ?The dosing regimen is 100 mg by mouth every 12 hours on days 1 to 28 of a 28-day cycle. This is being given monotherapy. It is planned to continue until disease progression or unacceptable toxicity.  Ms. Leslie Rivera was seen today after being referred by Dr. Alvy Bimler for management of olaparib.  She started this medication on 10/08/21.  She is accompanied today by her sister Leslie Rivera.  Leslie Rivera states that she is doing well.  She is reporting very mild diarrhea at times but has not needed to take any antidiarrheals.  She is also not needed to take any antinausea meds.  She states that she has nausea that she takes Dramamine which resolves the nausea.  We went over potential side effects of olaparib which include but are not limited to MDS/AML, pneumonitis, nausea, vomiting, fatigue, anemia, headache, diarrhea, neutropenia, decreased appetite, taste abnormality, dizziness, and arthralgia.  Leslie Rivera states that she will continue to take the 100 mg every 12 hours and is not interested in going up on the olaparib dose at this time.  She said she will let us know if this changes.  We also reviewed that she should stay away from grapefruit products and [place] orange due to the potential interaction with olaparib.  We also went over storage and  handling and to take olaparib every 12 hours. ? ?Leslie Rivera asked great questions and we did discuss immunizations that she may need.  She states that she has received some alerts via MyChart and also provided recommendations that she received from her [place].  We advised her to confirm which vaccines that she has received and then at that time Dr. Alvy Bimler or her PCP can advise of what vaccine she may need to ask.  Clinical pharmacy said that Ms. Penley can also reach out to [place] if needed. ? ?Leslie Rivera did state that she likes to drink scotch, whiskey, and beer once in a while.  We advised her to just drink these in moderation and to be especially careful since she also takes oxycodone/acetaminophen at a time for pain.  We will continue to monitor for toxicities which include potential liver toxicities with olaparib.  She states that she is due for a zolpidem refill.  This will be sent to her pharmacy of choice.  Today she will be receiving her first B12 injection.  She is scheduled to receive another 1 on 10/24/21 and at that point she will receive labs and see clinical pharmacy as well.  She then will see Dr. Alvy Bimler with labs on 11/07/21 and tentatively another B12 injection. ? ?Dose Modifications ?Olaparib 100 mg every 12 hours  ? ?Access Assessment ?Jazari L Dobratz will be receiving olaparib through [facility] ?Insurance Concerns: No ?Start date if known:  10/08/21 ? ?Allergies ?Allergies  ?Allergen Reactions  ? Barium-Containing Compounds   ?  Legs and arm cramping & caused fall  ? Hydrocodone Nausea And Vomiting  ?  Can tolerate Oxycodone  ? Erythromycin   ?  GI upset  ? Zofran [Ondansetron]   ?  Other reaction(s): Headache-Never wants  again  ? ? ?Vitals ? ?  10/17/2021  ? 10:00 AM 10/03/2021  ? 10:20 AM 09/12/2021  ? 10:41 AM  ?Vitals with BMI  ?Height '5\' 6"'$  '5\' 6"'$  '5\' 6"'$   ?Weight 157 lbs 2 oz 158 lbs 10 oz 158 lbs 3 oz  ?BMI 25.37 25.61 25.55  ?Systolic 914 782 956  ?Diastolic 92  76 70  ?Pulse 63 61 71  ?  ? ?Laboratory Data ? ?  Latest Ref Rng & Units 10/17/2021  ?  9:42 AM 10/03/2021  ? 10:01 AM 09/08/2021  ? 10:05 AM  ?CBC EXTENDED  ?WBC 4.0 - 10.5 K/uL 3.8   3.2   2.5    ?RBC 3.87 - 5.11 MIL/uL 3.43   3.33   3.02    ?Hemoglobin 12.0 - 15.0 g/dL 12.1   11.7   10.5    ?HCT 36.0 - 46.0 % 37.4   35.8   31.0    ?Platelets 150 - 400 K/uL 276   284   242    ?NEUT# 1.7 - 7.7 K/uL 1.7   1.4   0.8    ?Lymph# 0.7 - 4.0 K/uL 1.6   1.4   1.4    ?  ? ?  Latest Ref Rng & Units 10/17/2021  ?  9:42 AM 10/03/2021  ? 10:01 AM 09/08/2021  ? 10:05 AM  ?CMP  ?Glucose 70 - 99 mg/dL 99   82   94    ?BUN 8 - 23 mg/dL '18   13   17    '$ ?Creatinine 0.44 - 1.00 mg/dL 0.88   0.75   0.70    ?Sodium 135 - 145 mmol/L 139   139   139    ?Potassium 3.5 - 5.1 mmol/L 4.9   4.1   3.7    ?Chloride 98 - 111 mmol/L 109   107   107    ?CO2 22 - 32 mmol/L '24   25   24    '$ ?Calcium 8.9 - 10.3 mg/dL 9.9   9.7   9.8    ?Total Protein 6.5 - 8.1 g/dL 7.5   7.6   7.6    ?Total Bilirubin 0.3 - 1.2 mg/dL 0.5   0.4   0.7    ?Alkaline Phos 38 - 126 U/L 75   77   72    ?AST 15 - 41 U/L '15   17   19    '$ ?ALT 0 - 44 U/L '11   12   13    '$ ? ?No results found for: MG  ? ?Contraindications ?Contraindications were reviewed?  Yes ?Contraindications to therapy were identified?  No ? ?Safety Precautions ?The following safety precautions for the use of olaparib were reviewed:  ?Fever: reviewed the importance of having a thermometer and the Centers for Disease Control and Prevention (CDC) definition of fever which is 100.4?F (38?C) or higher. Patient should call 24/7 triage at [phone] if experiencing a fever or any other symptoms ?MDS/AML ?N/V/D ?Fatigue ?Pneumonitis ?Anemia ?Headache ?Neutropenia ?Arthralgia ? ?Medication Reconciliation ?Current Outpatient Medications  ?Medication Sig Dispense Refill  ? Calcium Carb-Cholecalciferol (CALCIUM 600+D3 PO) Take 1 tablet by  mouth in the morning and at bedtime.    ? cetirizine (ZYRTEC) 10 MG tablet Take 10 mg  by mouth every 30 (thirty) days. As needed    ? cholecalciferol (VITAMIN D3) 25 MCG (1000 UNIT) tablet Take 1,000 Units by mouth in the morning.    ? diazepam (VALIUM) 5 MG tablet Take 5 mg by mouth daily as needed for anxiety.    ? escitalopram (LEXAPRO) 10 MG tablet Take 1.5 tablets (15 mg total) by mouth daily. 45 tablet 3  ? levothyroxine (SYNTHROID, LEVOTHROID) 75 MCG tablet Take 75 mcg by mouth daily before breakfast.  3  ? olaparib (LYNPARZA) 100 MG tablet Take 1 tablet (100 mg total) by mouth 2 (two) times daily. Swallow whole. May take with food to decrease nausea and vomiting. 60 tablet 11  ? Polyethyl Glycol-Propyl Glycol (SYSTANE) 0.4-0.3 % SOLN Place 1-2 drops into both eyes 3 (three) times daily as needed (dry/irritated eyes.).    ? zolpidem (AMBIEN) 10 MG tablet Take 1 tablet (10 mg total) by mouth at bedtime as needed. for sleep 30 tablet 2  ? ondansetron (ZOFRAN) 8 MG tablet Take 1 tablet (8 mg total) by mouth every 8 (eight) hours as needed for refractory nausea / vomiting. (Patient not taking: Reported on 10/17/2021) 30 tablet 1  ? oxyCODONE-acetaminophen (PERCOCET/ROXICET) 5-325 MG tablet Take 1 tablet by mouth every 6 (six) hours as needed for severe pain. (Patient not taking: Reported on 10/17/2021) 30 tablet 0  ? prochlorperazine (COMPAZINE) 10 MG tablet Take 1 tablet (10 mg total) by mouth every 6 (six) hours as needed (Nausea or vomiting). (Patient not taking: Reported on 10/17/2021) 30 tablet 1  ? valACYclovir (VALTREX) 1000 MG tablet Take 1,000 mg by mouth daily as needed (Fever Blister). (Patient not taking: Reported on 10/17/2021)    ? ?No current facility-administered medications for this visit.  ? ?Facility-Administered Medications Ordered in Other Visits  ?Medication Dose Route Frequency Provider Last Rate Last Admin  ? cyanocobalamin ((VITAMIN B-12)) injection 1,000 mcg  1,000 mcg Intramuscular Once Heath Lark, MD      ? ? ?Medication reconciliation is based on the patient's most recent  medication list in the electronic medical record (EMR) including herbal products and OTC medications.  ? ?[name] medication list was reviewed today with the patient?  Yes ? ?Drug-drug interactions (

## 2021-10-17 NOTE — Telephone Encounter (Signed)
Pended zolpidem refill request to Dr. Alvy Bimler since controlled medication. Medication refilled. ? ?Raina Mina, RPH-CPP, 10/17/2021  12:12 PM   ?

## 2021-10-17 NOTE — Patient Instructions (Signed)
Vitamin B12 Deficiency Vitamin B12 deficiency occurs when the body does not have enough of this important vitamin. The body needs this vitamin: To make red blood cells. To make DNA. This is the genetic material inside cells. To help the nerves work properly so they can carry messages from the brain to the body. Vitamin B12 deficiency can cause health problems, such as not having enough red blood cells in the blood (anemia). This can lead to nerve damage if untreated. What are the causes? This condition may be caused by: Not eating enough foods that contain vitamin B12. Not having enough stomach acid and digestive fluids to properly absorb vitamin B12 from the food that you eat. Having certain diseases that make it hard to absorb vitamin B12. These diseases include Crohn's disease, chronic pancreatitis, and cystic fibrosis. An autoimmune disorder in which the body does not make enough of a protein (intrinsic factor) within the stomach, resulting in not enough absorption of vitamin B12. Having a surgery in which part of the stomach or small intestine is removed. Taking certain medicines that make it hard for the body to absorb vitamin B12. These include: Heartburn medicines, such as antacids and proton pump inhibitors. Some medicines that are used to treat diabetes. What increases the risk? The following factors may make you more likely to develop a vitamin B12 deficiency: Being an older adult. Eating a vegetarian or vegan diet that does not include any foods that come from animals. Eating a poor diet while you are pregnant. Taking certain medicines. Having alcoholism. What are the signs or symptoms? In some cases, there are no symptoms of this condition. If the condition leads to anemia or nerve damage, various symptoms may occur, such as: Weakness. Tiredness (fatigue). Loss of appetite. Numbness or tingling in your hands and feet. Redness and burning of the tongue. Depression,  confusion, or memory problems. Trouble walking. If anemia is severe, symptoms can include: Shortness of breath. Dizziness. Rapid heart rate. How is this diagnosed? This condition may be diagnosed with a blood test to measure the level of vitamin B12 in your blood. You may also have other tests, including: A group of tests that measure certain characteristics of blood cells (complete blood count, CBC). A blood test to measure intrinsic factor. A procedure where a thin tube with a camera on the end is used to look into your stomach or intestines (endoscopy). Other tests may be needed to discover the cause of the deficiency. How is this treated? Treatment for this condition depends on the cause. This condition may be treated by: Changing your eating and drinking habits, such as: Eating more foods that contain vitamin B12. Drinking less alcohol or no alcohol. Getting vitamin B12 injections. Taking vitamin B12 supplements by mouth (orally). Your health care provider will tell you which dose is best for you. Follow these instructions at home: Eating and drinking  Include foods in your diet that come from animals and contain a lot of vitamin B12. These include: Meats and poultry. This includes beef, pork, chicken, turkey, and organ meats, such as liver. Seafood. This includes clams, rainbow trout, salmon, tuna, and haddock. Eggs. Dairy foods such as milk, yogurt, and cheese. Eat foods that have vitamin B12 added to them (are fortified), such as ready-to-eat breakfast cereals. Check the label on the package to see if a food is fortified. The items listed above may not be a complete list of foods and beverages you can eat and drink. Contact a dietitian for   more information. Alcohol use Do not drink alcohol if: Your health care provider tells you not to drink. You are pregnant, may be pregnant, or are planning to become pregnant. If you drink alcohol: Limit how much you have to: 0-1 drink a  day for women. 0-2 drinks a day for men. Know how much alcohol is in your drink. In the [place] one drink equals one 12 oz bottle of beer (355 mL), one 5 oz glass of wine (148 mL), or one 1 oz glass of hard liquor (44 mL). General instructions Get vitamin B12 injections if told to by your health care provider. Take supplements only as told by your health care provider. Follow the directions carefully. Keep all follow-up visits. This is important. Contact a health care provider if: Your symptoms come back. Your symptoms get worse or do not improve with treatment. Get help right away: You develop shortness of breath. You have a rapid heart rate. You have chest pain. You become dizzy or you faint. These symptoms may be an emergency. Get help right away. Call 911. Do not wait to see if the symptoms will go away. Do not drive yourself to the hospital. Summary Vitamin B12 deficiency occurs when the body does not have enough of this important vitamin. Common causes include not eating enough foods that contain vitamin B12, not being able to absorb vitamin B12 from the food that you eat, having a surgery in which part of the stomach or small intestine is removed, or taking certain medicines. Eat foods that have vitamin B12 in them. Treatment may include making a change in the way you eat and drink, getting vitamin B12 injections, or taking vitamin B12 supplements. This information is not intended to replace advice given to you by your health care provider. Make sure you discuss any questions you have with your health care provider. Document Revised: 02/04/2021 Document Reviewed: 02/04/2021 Elsevier Patient Education  2023 Else[employer] Inc.  

## 2021-10-19 ENCOUNTER — Telehealth: Payer: Self-pay | Admitting: Oncology

## 2021-10-19 NOTE — Telephone Encounter (Signed)
Leslie Rivera called to find out if it is safe for her to have a covid booster and the pneumonia vaccine. ?

## 2021-10-20 ENCOUNTER — Encounter: Payer: Self-pay | Admitting: Hematology and Oncology

## 2021-10-20 NOTE — Telephone Encounter (Signed)
Yes, I suggest one at a time ?Pneumonia vaccine first ?

## 2021-10-20 NOTE — Telephone Encounter (Signed)
Called Leslie Rivera and advised her of message below from Dr. Alvy Bimler. ?

## 2021-10-24 ENCOUNTER — Other Ambulatory Visit: Payer: Self-pay

## 2021-10-24 ENCOUNTER — Inpatient Hospital Stay: Payer: Medicare Other

## 2021-10-24 ENCOUNTER — Inpatient Hospital Stay: Payer: Medicare Other | Attending: Genetic Counselor

## 2021-10-24 ENCOUNTER — Inpatient Hospital Stay: Payer: Medicare Other | Admitting: Pharmacist

## 2021-10-24 VITALS — BP 136/89 | HR 70 | Temp 97.9°F | Resp 16 | Ht 66.0 in | Wt 158.2 lb

## 2021-10-24 DIAGNOSIS — Z79899 Other long term (current) drug therapy: Secondary | ICD-10-CM | POA: Insufficient documentation

## 2021-10-24 DIAGNOSIS — C482 Malignant neoplasm of peritoneum, unspecified: Secondary | ICD-10-CM

## 2021-10-24 DIAGNOSIS — R350 Frequency of micturition: Secondary | ICD-10-CM | POA: Insufficient documentation

## 2021-10-24 DIAGNOSIS — Z90710 Acquired absence of both cervix and uterus: Secondary | ICD-10-CM | POA: Insufficient documentation

## 2021-10-24 DIAGNOSIS — R110 Nausea: Secondary | ICD-10-CM | POA: Diagnosis not present

## 2021-10-24 DIAGNOSIS — Z90722 Acquired absence of ovaries, bilateral: Secondary | ICD-10-CM | POA: Diagnosis not present

## 2021-10-24 DIAGNOSIS — R32 Unspecified urinary incontinence: Secondary | ICD-10-CM | POA: Diagnosis not present

## 2021-10-24 DIAGNOSIS — E538 Deficiency of other specified B group vitamins: Secondary | ICD-10-CM | POA: Diagnosis not present

## 2021-10-24 DIAGNOSIS — Z9221 Personal history of antineoplastic chemotherapy: Secondary | ICD-10-CM | POA: Insufficient documentation

## 2021-10-24 DIAGNOSIS — C5700 Malignant neoplasm of unspecified fallopian tube: Secondary | ICD-10-CM | POA: Insufficient documentation

## 2021-10-24 DIAGNOSIS — C5701 Malignant neoplasm of right fallopian tube: Secondary | ICD-10-CM

## 2021-10-24 LAB — CMP (CANCER CENTER ONLY)
ALT: 10 U/L (ref 0–44)
AST: 15 U/L (ref 15–41)
Albumin: 4.1 g/dL (ref 3.5–5.0)
Alkaline Phosphatase: 66 U/L (ref 38–126)
Anion gap: 8 (ref 5–15)
BUN: 19 mg/dL (ref 8–23)
CO2: 23 mmol/L (ref 22–32)
Calcium: 9.6 mg/dL (ref 8.9–10.3)
Chloride: 107 mmol/L (ref 98–111)
Creatinine: 0.83 mg/dL (ref 0.44–1.00)
GFR, Estimated: >60 mL/min (ref >60)
Glucose, Bld: 98 mg/dL (ref 70–99)
Potassium: 4.1 mmol/L (ref 3.5–5.1)
Sodium: 138 mmol/L (ref 135–145)
Total Bilirubin: 0.5 mg/dL (ref 0.3–1.2)
Total Protein: 7.2 g/dL (ref 6.5–8.1)

## 2021-10-24 LAB — CBC WITH DIFFERENTIAL (CANCER CENTER ONLY)
Abs Immature Granulocytes: 0.02 10*3/uL (ref 0.00–0.07)
Basophils Absolute: 0 10*3/uL (ref 0.0–0.1)
Basophils Relative: 1 %
Eosinophils Absolute: 0.1 10*3/uL (ref 0.0–0.5)
Eosinophils Relative: 1 %
HCT: 35.2 % — ABNORMAL LOW (ref 36.0–46.0)
Hemoglobin: 11.8 g/dL — ABNORMAL LOW (ref 12.0–15.0)
Immature Granulocytes: 1 %
Lymphocytes Relative: 37 %
Lymphs Abs: 1.5 10*3/uL (ref 0.7–4.0)
MCH: 35.5 pg — ABNORMAL HIGH (ref 26.0–34.0)
MCHC: 33.5 g/dL (ref 30.0–36.0)
MCV: 106 fL — ABNORMAL HIGH (ref 80.0–100.0)
Monocytes Absolute: 0.3 10*3/uL (ref 0.1–1.0)
Monocytes Relative: 8 %
Neutro Abs: 2.2 10*3/uL (ref 1.7–7.7)
Neutrophils Relative %: 52 %
Platelet Count: 249 10*3/uL (ref 150–400)
RBC: 3.32 MIL/uL — ABNORMAL LOW (ref 3.87–5.11)
RDW: 12.4 % (ref 11.5–15.5)
WBC Count: 4.2 10*3/uL (ref 4.0–10.5)
nRBC: 0 % (ref 0.0–0.2)

## 2021-10-24 MED ORDER — CYANOCOBALAMIN 1000 MCG/ML IJ SOLN
1000.0000 ug | Freq: Once | INTRAMUSCULAR | Status: AC
  Administered 2021-10-24: 1000 ug via INTRAMUSCULAR
  Filled 2021-10-24: qty 1

## 2021-10-24 NOTE — Progress Notes (Signed)
?[place]  ?     Telephone: [phone]?Fax: [phone]  ? ?Oncology Clinical Pharmacist Practitioner Progress Note ? ?Leslie Rivera was contacted via in-person visit to discuss her chemotherapy regimen for olaparib for the treatment of metastatic fallopian tube carcinoma which they receive under the care of Dr. Heath Lark.  ? ?Current treatment regimen and start date ?Olaparib (10/08/21) ? ?Interval History ?She continues on olaparib 100 mg by mouth every 12 hours on days 1 to 28 of a 28-day cycle. This is being given  monotherapy. Therapy is planned to continue until disease progression or unacceptable toxicity.  ? ?Response to Therapy ?Leslie Rivera was seen today by clinical pharmacy as follow-up to her olaparib management.  She is accompanied today by her sister Gae Bon.  Leslie Rivera states that she is doing well.  She says she is having very mild nausea which she continues to take a Dramamine at times which alleviates the nausea.  She is also having some headaches which she takes either acetaminophen or ibuprofen for.  We did go over today that it is recommended not to exceed 4 g of acetaminophen in a 24-hour period and to limit alcohol consumption if using acetaminophen.  She states she has had some cluster headaches in the past but she is not interested in seeing neurology at this time for this condition.  These headaches are not new. She does report some overall joint pain which is not new but she does exercise with walking and will be seeing her yoga therapist soon as well.  She also takes acetaminophen for this as needed.  The dental work pain that she was having has subsided per her report.  We also discussed that her olaparib is now coming through [facility] and she had inquired about potentially not having to sign for this medication.  We gave her the number for [facility] to see if they may be able to avoid her having to sign for the medication when she gets.  She also states that  she does not mind signing for the medication if they can bring it on a certain day when she is not working. ? ?She will receive her second B12 injection today and she is tentatively scheduled to see Dr. Alvy Bimler with labs and her third B12 injection on 11/07/21.  We will add labs and a clinical pharmacy visit for 9/56/38 and if applicable Dr. Alvy Bimler can set up another B12 injection after her visit on 11/07/21.  Leslie Rivera does feel that the first injection worked well and she feels she has more energy today.  We also provided her today the CDC vaccination schedule and she will use the schedule to discuss with her PCP and local pharmacy what vaccination she has received so that she can receive the proper vaccinations in the future. Labs, vitals, treatment parameters, and manufacturer guidelines assessing toxicity were reviewed with Leslie Rivera today. Based on these values, patient is in agreement to continue olaparib therapy at this time. ? ?Allergies ?Allergies  ?Allergen Reactions  ? Barium-Containing Compounds   ?  Legs and arm cramping & caused fall  ? Hydrocodone Nausea And Vomiting  ?  Can tolerate Oxycodone  ? Erythromycin   ?  GI upset  ? Zofran [Ondansetron]   ?  Other reaction(s): Headache-Never wants  again  ? ? ?Vitals ? ?  10/24/2021  ?  9:07 AM 10/17/2021  ? 10:00 AM 10/03/2021  ? 10:20 AM  ?Vitals with BMI  ?  Height '5\' 6"'$  '5\' 6"'$  '5\' 6"'$   ?Weight 158 lbs 3 oz 157 lbs 2 oz 158 lbs 10 oz  ?BMI 25.55 25.37 25.61  ?Systolic 782 423 536  ?Diastolic 89 92 76  ?Pulse 70 63 61  ?  ? ?Laboratory Data ? ?  Latest Ref Rng & Units 10/24/2021  ?  8:54 AM 10/17/2021  ?  9:42 AM 10/03/2021  ? 10:01 AM  ?CBC EXTENDED  ?WBC 4.0 - 10.5 K/uL 4.2   3.8   3.2    ?RBC 3.87 - 5.11 MIL/uL 3.32   3.43   3.33    ?Hemoglobin 12.0 - 15.0 g/dL 11.8   12.1   11.7    ?HCT 36.0 - 46.0 % 35.2   37.4   35.8    ?Platelets 150 - 400 K/uL 249   276   284    ?NEUT# 1.7 - 7.7 K/uL 2.2   1.7   1.4    ?Lymph# 0.7 - 4.0 K/uL 1.5   1.6   1.4    ?  ? ?   Latest Ref Rng & Units 10/24/2021  ?  8:54 AM 10/17/2021  ?  9:42 AM 10/03/2021  ? 10:01 AM  ?CMP  ?Glucose 70 - 99 mg/dL 98   99   82    ?BUN 8 - 23 mg/dL '19   18   13    '$ ?Creatinine 0.44 - 1.00 mg/dL 0.83   0.88   0.75    ?Sodium 135 - 145 mmol/L 138   139   139    ?Potassium 3.5 - 5.1 mmol/L 4.1   4.9   4.1    ?Chloride 98 - 111 mmol/L 107   109   107    ?CO2 22 - 32 mmol/L '23   24   25    '$ ?Calcium 8.9 - 10.3 mg/dL 9.6   9.9   9.7    ?Total Protein 6.5 - 8.1 g/dL 7.2   7.5   7.6    ?Total Bilirubin 0.3 - 1.2 mg/dL 0.5   0.5   0.4    ?Alkaline Phos 38 - 126 U/L 66   75   77    ?AST 15 - 41 U/L '15   15   17    '$ ?ALT 0 - 44 U/L '10   11   12    '$ ?  ?No results found for: MG  ? ?Adverse Effects Assessment ?Headache: We discussed that olaparib can produce headaches in about 20% of patients looking at all grades and trial data.  Most of these headaches are not severe in nature.  She continues to take acetaminophen or ibuprofen with success. ?Joint pain: She states this is ongoing dating prior to olaparib use.  Continues to exercise and see her yoga therapist.  She also uses acetaminophen or ibuprofen as needed ? ?Adherence Assessment ?Leslie Rivera reports missing 0 doses over the past 1 weeks.   ?Reason for missed dose: N/A ?Patient was re-educated on importance of adherence.  ? ?Access Assessment ?Leslie Rivera is currently receiving her olaparib through [facility] ?Insurance concerns: None ? ?Medication Reconciliation ?[name] medication list was reviewed today with the patient?  Yes ?New medications or herbal supplements have recently been started?  No ?Any medications have been discontinued?  No ?The medication list was updated and reconciled based on the patient's most recent medication list in the electronic medical record (EMR) including herbal products and OTC medications.  ? ?  Medications ?Current Outpatient Medications  ?Medication Sig Dispense Refill  ? Calcium Carb-Cholecalciferol (CALCIUM 600+D3 PO)  Take 1 tablet by mouth in the morning and at bedtime.    ? cetirizine (ZYRTEC) 10 MG tablet Take 10 mg by mouth every 30 (thirty) days. As needed    ? cholecalciferol (VITAMIN D3) 25 MCG (1000 UNIT) tablet Take 1,000 Units by mouth in the morning.    ? diazepam (VALIUM) 5 MG tablet Take 5 mg by mouth daily as needed for anxiety.    ? escitalopram (LEXAPRO) 10 MG tablet Take 1.5 tablets (15 mg total) by mouth daily. 45 tablet 3  ? levothyroxine (SYNTHROID, LEVOTHROID) 75 MCG tablet Take 75 mcg by mouth daily before breakfast.  3  ? olaparib (LYNPARZA) 100 MG tablet Take 1 tablet (100 mg total) by mouth 2 (two) times daily. Swallow whole. May take with food to decrease nausea and vomiting. 60 tablet 11  ? ondansetron (ZOFRAN) 8 MG tablet Take 1 tablet (8 mg total) by mouth every 8 (eight) hours as needed for refractory nausea / vomiting. (Patient not taking: Reported on 10/17/2021) 30 tablet 1  ? oxyCODONE-acetaminophen (PERCOCET/ROXICET) 5-325 MG tablet Take 1 tablet by mouth every 6 (six) hours as needed for severe pain. (Patient not taking: Reported on 10/17/2021) 30 tablet 0  ? Polyethyl Glycol-Propyl Glycol (SYSTANE) 0.4-0.3 % SOLN Place 1-2 drops into both eyes 3 (three) times daily as needed (dry/irritated eyes.).    ? prochlorperazine (COMPAZINE) 10 MG tablet Take 1 tablet (10 mg total) by mouth every 6 (six) hours as needed (Nausea or vomiting). (Patient not taking: Reported on 10/17/2021) 30 tablet 1  ? valACYclovir (VALTREX) 1000 MG tablet Take 1,000 mg by mouth daily as needed (Fever Blister). (Patient not taking: Reported on 10/17/2021)    ? zolpidem (AMBIEN) 10 MG tablet Take 1 tablet (10 mg total) by mouth at bedtime as needed. for sleep 30 tablet 2  ? ?No current facility-administered medications for this visit.  ? ? ?Drug-Drug Interactions (DDIs) ?DDIs were evaluated?  Yes ?Significant DDIs?  No ?The patient was instructed to speak with their health care provider and/or the oral chemotherapy pharmacist  before starting any new drug, including prescription or over the counter, natural / herbal products, or vitamins. ? ?Supportive Care ?Fever: reviewed the importance of having a thermometer and the Centers fo

## 2021-10-25 ENCOUNTER — Telehealth: Payer: Self-pay | Admitting: Hematology and Oncology

## 2021-10-25 LAB — CA 125: Cancer Antigen (CA) 125: 24.2 U/mL (ref 0.0–38.1)

## 2021-10-25 NOTE — Telephone Encounter (Signed)
Per 5/1 los called and spoke to pt about lab and pharm appointment   pt confirmed appointment  ?

## 2021-10-26 DIAGNOSIS — L821 Other seborrheic keratosis: Secondary | ICD-10-CM | POA: Diagnosis not present

## 2021-11-01 ENCOUNTER — Ambulatory Visit
Admission: RE | Admit: 2021-11-01 | Discharge: 2021-11-01 | Disposition: A | Discharge status: Home / Self Care | Payer: Medicare Other | Source: Ambulatory Visit | Attending: Sports Medicine | Admitting: Sports Medicine

## 2021-11-01 ENCOUNTER — Ambulatory Visit: Payer: Medicare Other | Admitting: Sports Medicine

## 2021-11-01 VITALS — BP 118/82 | Ht 65.75 in | Wt 158.0 lb

## 2021-11-01 DIAGNOSIS — M7502 Adhesive capsulitis of left shoulder: Secondary | ICD-10-CM | POA: Diagnosis not present

## 2021-11-01 DIAGNOSIS — M25512 Pain in left shoulder: Secondary | ICD-10-CM | POA: Diagnosis not present

## 2021-11-02 NOTE — Progress Notes (Addendum)
? ?  Subjective:  ? ? Patient ID: Leslie Rivera, female    DOB: 18-Jul-1945, 76 y.o.   MRN: [id number] ? ?HPI chief complaint: Left shoulder pain ? ?Leslie Rivera is a very pleasant right-hand-dominant 76 year old female that comes in today complaining of several months of increasing left shoulder pain and decreased range of motion.  She denies any injury to the shoulder.  She is very active and enjoys yoga and has been working with her Art gallery manager for years.  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  She is doing well and recently started on an oral chemotherapy drug named [place]  Unfortunately, one of the side effects of this medication is joint pain she is experiencing pain in most of her major joints but she started this medicine about 3 weeks ago and her left shoulder pain has been present much longer than that and the quality of pain is different than the achiness she is getting with her other joints.  Pain in her shoulder can be quite sudden and severe.  It can resolve rather quickly as well.  She is not noticed any weakness.  Pain is diffuse throughout the shoulder.  No numbness or tingling.  No prior shoulder surgeries.  She does take Advil and Tylenol as needed and these both do seem to help. ? ?Past medical history reviewed ?Medications reviewed ?Allergies reviewed ? ? ? ?Review of Systems ?As above ?   ?Objective:  ? Physical Exam ? ?Well-developed, well-nourished.  No acute distress ? ?Left shoulder: Passive abduction of the left shoulder is to about 120 degrees.  Passive external rotation is to about 60 degrees.  Internal rotation is 80 degrees but painful.  Forward flexion actively is limited to 140 degrees.  Negative empty can, negative Hawkins.  Rotator cuff strength is 5/5 and does not reproduce pain.  No tenderness over the bicipital groove.  Neurovascularly intact distally. ? ?Right shoulder: Full range of motion.  Good rotator cuff strength.  Neurovascularly intact  distally. ? ? ?   ?Assessment & Plan:  ? ?Left shoulder pain likely secondary to adhesive capsulitis ?History of fallopian tube cancer-recently starting Lynparza. ? 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  I would like for Leslie Rivera to begin doing some range of motion exercises.  She will elicit the help of her yoga instructor for this as well (she has a background in physical therapy too).  No strengthening to be done at this time.  Follow-up with me in 6 weeks for reevaluation.  I will order an x-ray of her shoulder to rule out glenohumeral osteoarthritis.  If symptoms persist at follow-up then consider glenohumeral cortisone injection. ? ?This note was dictated using Dragon naturally speaking software and may contain errors in syntax, spelling, or content which have not been identified prior to signing this note.  ? ?Addendum: X-rays reviewed.  No appreciable glenohumeral osteoarthritis. ?

## 2021-11-04 ENCOUNTER — Encounter: Payer: Self-pay | Admitting: Gynecologic Oncology

## 2021-11-07 ENCOUNTER — Inpatient Hospital Stay: Payer: Medicare Other | Admitting: Hematology and Oncology

## 2021-11-07 ENCOUNTER — Inpatient Hospital Stay: Payer: Medicare Other

## 2021-11-07 ENCOUNTER — Encounter: Payer: Self-pay | Admitting: Hematology and Oncology

## 2021-11-07 ENCOUNTER — Other Ambulatory Visit: Payer: Self-pay

## 2021-11-07 VITALS — BP 115/81 | HR 80 | Temp 98.8°F | Resp 16 | Ht 65.0 in | Wt 155.2 lb

## 2021-11-07 DIAGNOSIS — R110 Nausea: Secondary | ICD-10-CM | POA: Diagnosis not present

## 2021-11-07 DIAGNOSIS — M2550 Pain in unspecified joint: Secondary | ICD-10-CM

## 2021-11-07 DIAGNOSIS — Z9221 Personal history of antineoplastic chemotherapy: Secondary | ICD-10-CM | POA: Diagnosis not present

## 2021-11-07 DIAGNOSIS — E538 Deficiency of other specified B group vitamins: Secondary | ICD-10-CM

## 2021-11-07 DIAGNOSIS — C482 Malignant neoplasm of peritoneum, unspecified: Secondary | ICD-10-CM

## 2021-11-07 DIAGNOSIS — R32 Unspecified urinary incontinence: Secondary | ICD-10-CM | POA: Diagnosis not present

## 2021-11-07 DIAGNOSIS — C5701 Malignant neoplasm of right fallopian tube: Secondary | ICD-10-CM | POA: Diagnosis not present

## 2021-11-07 DIAGNOSIS — R350 Frequency of micturition: Secondary | ICD-10-CM | POA: Diagnosis not present

## 2021-11-07 DIAGNOSIS — Z90722 Acquired absence of ovaries, bilateral: Secondary | ICD-10-CM | POA: Diagnosis not present

## 2021-11-07 DIAGNOSIS — Z90710 Acquired absence of both cervix and uterus: Secondary | ICD-10-CM | POA: Diagnosis not present

## 2021-11-07 DIAGNOSIS — C5700 Malignant neoplasm of unspecified fallopian tube: Secondary | ICD-10-CM | POA: Diagnosis not present

## 2021-11-07 DIAGNOSIS — R5383 Other fatigue: Secondary | ICD-10-CM

## 2021-11-07 DIAGNOSIS — Z79899 Other long term (current) drug therapy: Secondary | ICD-10-CM | POA: Diagnosis not present

## 2021-11-07 LAB — CMP (CANCER CENTER ONLY)
ALT: 10 U/L (ref 0–44)
AST: 15 U/L (ref 15–41)
Albumin: 4.3 g/dL (ref 3.5–5.0)
Alkaline Phosphatase: 62 U/L (ref 38–126)
Anion gap: 8 (ref 5–15)
BUN: 22 mg/dL (ref 8–23)
CO2: 25 mmol/L (ref 22–32)
Calcium: 10.1 mg/dL (ref 8.9–10.3)
Chloride: 105 mmol/L (ref 98–111)
Creatinine: 1.21 mg/dL — ABNORMAL HIGH (ref 0.44–1.00)
GFR, Estimated: 47 mL/min — ABNORMAL LOW (ref >60)
Glucose, Bld: 107 mg/dL — ABNORMAL HIGH (ref 70–99)
Potassium: 4.6 mmol/L (ref 3.5–5.1)
Sodium: 138 mmol/L (ref 135–145)
Total Bilirubin: 0.5 mg/dL (ref 0.3–1.2)
Total Protein: 7.7 g/dL (ref 6.5–8.1)

## 2021-11-07 LAB — CBC WITH DIFFERENTIAL (CANCER CENTER ONLY)
Abs Immature Granulocytes: 0.01 10*3/uL (ref 0.00–0.07)
Basophils Absolute: 0 10*3/uL (ref 0.0–0.1)
Basophils Relative: 1 %
Eosinophils Absolute: 0.1 10*3/uL (ref 0.0–0.5)
Eosinophils Relative: 2 %
HCT: 37.7 % (ref 36.0–46.0)
Hemoglobin: 12.8 g/dL (ref 12.0–15.0)
Immature Granulocytes: 0 %
Lymphocytes Relative: 44 %
Lymphs Abs: 1.8 10*3/uL (ref 0.7–4.0)
MCH: 35.6 pg — ABNORMAL HIGH (ref 26.0–34.0)
MCHC: 34 g/dL (ref 30.0–36.0)
MCV: 104.7 fL — ABNORMAL HIGH (ref 80.0–100.0)
Monocytes Absolute: 0.4 10*3/uL (ref 0.1–1.0)
Monocytes Relative: 9 %
Neutro Abs: 1.8 10*3/uL (ref 1.7–7.7)
Neutrophils Relative %: 44 %
Platelet Count: 265 10*3/uL (ref 150–400)
RBC: 3.6 MIL/uL — ABNORMAL LOW (ref 3.87–5.11)
RDW: 12.1 % (ref 11.5–15.5)
WBC Count: 4.1 10*3/uL (ref 4.0–10.5)
nRBC: 0 % (ref 0.0–0.2)

## 2021-11-07 MED ORDER — CYANOCOBALAMIN 1000 MCG/ML IJ SOLN
1000.0000 ug | Freq: Once | INTRAMUSCULAR | Status: AC
  Administered 2021-11-07: 1000 ug via INTRAMUSCULAR
  Filled 2021-11-07: qty 1

## 2021-11-07 NOTE — Assessment & Plan Note (Signed)
Overall, she tolerated [place] well ?I believe nausea could be associated with side effects of treatment but not the joint pain and excessive fatigue ?Results of the tumor marker is pending, we will call her with results ?I recommend repeat blood work next month as scheduled and I plan to repeat CT imaging in July with blood work ?

## 2021-11-07 NOTE — Progress Notes (Signed)
[facility] ?OFFICE PROGRESS NOTE ? ?Patient Care Team: ?Merrilee Seashore, MD as PCP - General (Internal Medicine) ?Merrilee Seashore, MD as Consulting Physician (Internal Medicine) ?Juanita Craver, MD as Consulting Physician (Gastroenterology) ? 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 ?Vitamin B12 deficiency ?Leslie Rivera was found to have severe vitamin B12 deficiency but her blood counts are improving ?Overall, Leslie Rivera is responding to B12 injections ?Her joint pain and excessive fatigue is likely due to bone marrow recovery and erythrocytosis ?Leslie Rivera will continue monthly B12 injection for now ? ?Joint pain ?Her diffuse joint pain is not a common feature or side effects of olaparib ?I suspect is related to bone marrow expansion from recent B12 injection and erythrocytosis ?Leslie Rivera is reassured ? ?Other fatigue ?Leslie Rivera complained of excessive fatigue despite improvement of her blood count ?I will order TSH for her next blood draw ? ?Orders Placed This Encounter  ?Procedures  ? CT CHEST ABDOMEN PELVIS W CONTRAST  ?  NO oral contrast, IV contrast only  ?  Standing Status:   Future  ?  Standing Expiration Date:   11/08/2022  ?  Order Specific Question:   Preferred imaging location?  ?  Answer:   [facility]  ?  Order Specific Question:   Radiology Contrast Protocol - do NOT remove file path  ?  Answer:   [email or web address]  ? TSH  ?  Standing Status:   Future  ?  Standing Expiration Date:   11/08/2022  ? ? ?All questions were answered. The patient knows to call the clinic with any problems, questions or concerns. ?The total time  spent in the appointment was 30 minutes encounter with patients including review of chart and various tests results, discussions about plan of care and coordination of care plan ?  ?Heath Lark, MD ?11/07/2021 3:15 PM ? ?INTERVAL HISTORY: ?Please see below for problem oriented charting. ?Leslie Rivera returns for treatment follow-up on Lynparza for maintenance treatment ?Leslie Rivera complained of diffuse joint pain and fatigue since last time I saw her ?Leslie Rivera has occasional nausea but no vomiting ?Appetite is fair ?Leslie Rivera has numerous questions regarding future follow-up ? ?REVIEW OF SYSTEMS:   ?Constitutional: Denies fevers, chills or abnormal weight loss ?Eyes: Denies blurriness of vision ?Ears, nose, mouth, throat, and face: Denies mucositis or sore throat ?Respiratory: Denies cough, dyspnea or wheezes ?Cardiovascular: Denies palpitation, chest discomfort or lower extremity swelling ?Gastrointestinal:  Denies nausea, heartburn or change in bowel habits ?Skin: Denies abnormal skin rashes ?Lymphatics: Denies new lymphadenopathy or easy bruising ?Neurological:Denies numbness, tingling or new weaknesses ?Behavioral/Psych: Mood is stable, no new changes  ?All other systems were reviewed with the patient and are negative. ? ?I have reviewed the past medical history, past surgical history, social history and family history with the patient and they are unchanged from previous note. ? ?ALLERGIES:  is allergic to barium-containing compounds, hydrocodone, erythromycin, and zofran [ondansetron]. ? ?MEDICATIONS:  ?Current Outpatient Medications  ?Medication Sig Dispense Refill  ? Calcium Carb-Cholecalciferol (CALCIUM 600+D3 PO) Take 1 tablet by mouth in the morning and at bedtime.    ? cetirizine (ZYRTEC) 10 MG tablet Take 10 mg by mouth  every 30 (thirty) days. As needed    ? cholecalciferol (VITAMIN D3) 25 MCG (1000 UNIT) tablet Take 1,000 Units by mouth in the morning.    ? clobetasol cream (TEMOVATE) 3.25 % 1 application to affected area    ?  diazepam (VALIUM) 5 MG tablet Take 5 mg by mouth daily as needed for anxiety.    ? escitalopram (LEXAPRO) 10 MG tablet Take 1.5 tablets (15 mg total) by mouth daily. 45 tablet 3  ? levothyroxine (SYNTHROID, LEVOTHROID) 75 MCG tablet Take 75 mcg by mouth daily before breakfast.  3  ? olaparib (LYNPARZA) 100 MG tablet Take 1 tablet (100 mg total) by mouth 2 (two) times daily. Swallow whole. May take with food to decrease nausea and vomiting. 60 tablet 11  ? ondansetron (ZOFRAN) 8 MG tablet Take 1 tablet (8 mg total) by mouth every 8 (eight) hours as needed for refractory nausea / vomiting. 30 tablet 1  ? oxyCODONE-acetaminophen (PERCOCET/ROXICET) 5-325 MG tablet Take 1 tablet by mouth every 6 (six) hours as needed for severe pain. 30 tablet 0  ? Polyethyl Glycol-Propyl Glycol (SYSTANE) 0.4-0.3 % SOLN Place 1-2 drops into both eyes 3 (three) times daily as needed (dry/irritated eyes.).    ? prochlorperazine (COMPAZINE) 10 MG tablet Take 1 tablet (10 mg total) by mouth every 6 (six) hours as needed (Nausea or vomiting). 30 tablet 1  ? SUNSCREEN SPF50 EX     ? valACYclovir (VALTREX) 1000 MG tablet Take 1,000 mg by mouth daily as needed (Fever Blister).    ? zolpidem (AMBIEN) 10 MG tablet Take 1 tablet (10 mg total) by mouth at bedtime as needed. for sleep 30 tablet 2  ? ?No current facility-administered medications for this visit.  ? ? ?SUMMARY OF ONCOLOGIC HISTORY: ?Oncology History Overview Note  ?Normal MMR ?HRD positive ?  ?Cancer of right fallopian tube [facility])  ?01/05/2021 Pathology Results  ? SURGICAL PATHOLOGY  ?CASE: [id number]  ?PATIENT: Leslie Rivera  ?Surgical Pathology Report  ? ?Reason for Addendum #1:  Immunohistochemistry results  ?Reason for Addendum #2:  DNA Mismatch Repair IHC Results  ? ?Clinical History: history of colon cancer, now with omental thickening  ?(cm)  ? ?FINAL MICROSCOPIC DIAGNOSIS:  ? ?A. OMENTUM, NEEDLE CORE BIOPSY:  ?- Metastatic poorly differentiated adenocarcinoma.  See comment   ? ?COMMENT:  ? ?Immunohistochemical stains show that the tumor cells are positive for CK7 and negative for CDX2 and CK20.  This immunoprofile is nonspecific and differential diagnosis can include an upper gastrointestinal, breast and lung primary among other possibilities.  Only a very small fraction of primary colonic adenocarcinoma was immunoprofile.  Clinical and radiologic correlation suggested.  ? ?Additional immunohistochemical stains are performed and show that the tumor cells are positive for WT1, PAX8 and ER.  Immunostain for p53 shows a clonal overexpression pattern.  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  Clinical and radiologic correlation is suggested.  ?  ?01/19/2021 Tumor Marker  ? Patient's tumor was tested for the following markers: CA-125. ?Results of the tumor marker test revealed [id number]. ?  ?01/28/2021 PET scan  ? 1. Examination is positive for extensive FDG avid peritoneal disease within the abdomen and pelvis including multiple implants upon the surface of liver and extensive omental caking. Moderate ascites noted within the pelvis. ?2. FDG avid retroperitoneal, retrocrural, right internal mammary, and mediastinal lymph nodes compatible with metastatic adenopathy. ?3. Small right pleural effusion. ?4.  Aortic Atherosclerosis (ICD10-I70.0). ?  ?  ?  01/31/2021 Initial Diagnosis  ? Primary peritoneal carcinomatosis ([place]) ? ?  ?01/31/2021 Cancer Staging  ? Staging form: Ovary, Fallopian Tube, and Primary Peritoneal Carcinoma, AJCC 8th Edition ?- Clinical stage from 01/31/2021: FIGO Stage IV (cT2b, cN1b, cM1) - Signed by Heath Lark, MD on 01/31/2021 ?Stage prefix: Initial diagnosis ? ?  ?02/14/2021 Tumor Marker  ? Patient's tumor was tested for the following markers: CA-125. ?Results of the tumor marker test revealed 8668. ?  ?02/16/2021 - 08/11/2021 Chemotherapy  ? Patient is on Treatment Plan : OVARIAN Carboplatin   ? ?   ? Genetic  Testing  ? 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 Remainder of testing was negative/normal. The report date is 02/09/2021. ? ?The CancerNext-Expanded +

## 2021-11-07 NOTE — Assessment & Plan Note (Signed)
Her diffuse joint pain is not a common feature or side effects of olaparib ?I suspect is related to bone marrow expansion from recent B12 injection and erythrocytosis ?She is reassured ?

## 2021-11-07 NOTE — Assessment & Plan Note (Signed)
She complained of excessive fatigue despite improvement of her blood count ?I will order TSH for her next blood draw ?

## 2021-11-07 NOTE — Assessment & Plan Note (Signed)
She was found to have severe vitamin B12 deficiency but her blood counts are improving ?Overall, she is responding to B12 injections ?Her joint pain and excessive fatigue is likely due to bone marrow recovery and erythrocytosis ?She will continue monthly B12 injection for now ?

## 2021-11-10 ENCOUNTER — Telehealth: Payer: Self-pay

## 2021-11-10 NOTE — Telephone Encounter (Signed)
-----   Message from Heath Lark, MD sent at 11/10/2021  2:56 PM EDT ----- Pls remind her she is supposed to call and scehdule CT for 7/14

## 2021-11-10 NOTE — Telephone Encounter (Signed)
Called and left a message asking her to call and schedule CT scan on 7/14. Left radiology scheduling # and ask her to call the office back for questions.

## 2021-11-14 ENCOUNTER — Other Ambulatory Visit: Payer: Self-pay

## 2021-11-14 ENCOUNTER — Inpatient Hospital Stay (HOSPITAL_BASED_OUTPATIENT_CLINIC_OR_DEPARTMENT_OTHER): Payer: Medicare Other | Admitting: Gynecologic Oncology

## 2021-11-14 ENCOUNTER — Encounter: Payer: Self-pay | Admitting: Gynecologic Oncology

## 2021-11-14 VITALS — BP 143/81 | HR 60 | Temp 98.1°F | Resp 16 | Ht 65.5 in | Wt 159.2 lb

## 2021-11-14 DIAGNOSIS — N393 Stress incontinence (female) (male): Secondary | ICD-10-CM | POA: Diagnosis not present

## 2021-11-14 DIAGNOSIS — Z9221 Personal history of antineoplastic chemotherapy: Secondary | ICD-10-CM | POA: Diagnosis not present

## 2021-11-14 DIAGNOSIS — R110 Nausea: Secondary | ICD-10-CM | POA: Diagnosis not present

## 2021-11-14 DIAGNOSIS — R350 Frequency of micturition: Secondary | ICD-10-CM | POA: Diagnosis not present

## 2021-11-14 DIAGNOSIS — R32 Unspecified urinary incontinence: Secondary | ICD-10-CM | POA: Diagnosis not present

## 2021-11-14 DIAGNOSIS — E538 Deficiency of other specified B group vitamins: Secondary | ICD-10-CM | POA: Diagnosis not present

## 2021-11-14 DIAGNOSIS — Z90710 Acquired absence of both cervix and uterus: Secondary | ICD-10-CM | POA: Diagnosis not present

## 2021-11-14 DIAGNOSIS — Z90722 Acquired absence of ovaries, bilateral: Secondary | ICD-10-CM | POA: Diagnosis not present

## 2021-11-14 DIAGNOSIS — Z79899 Other long term (current) drug therapy: Secondary | ICD-10-CM | POA: Diagnosis not present

## 2021-11-14 DIAGNOSIS — C5700 Malignant neoplasm of unspecified fallopian tube: Secondary | ICD-10-CM

## 2021-11-14 NOTE — Progress Notes (Signed)
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 OMENTUM, NEEDLE CORE BIOPSY:  - Metastatic poorly differentiated adenocarcinoma.  See comment   COMMENT:   Immunohistochemical stains show that the tumor cells are positive for CK7 and negative for CDX2 and CK20.  This immunoprofile is nonspecific and differential diagnosis can include an upper gastrointestinal, breast and lung primary among other possibilities.  Only a very small fraction of primary colonic adenocarcinoma was immunoprofile.  Clinical and radiologic correlation suggested.   Additional immunohistochemical stains are performed and show that the tumor cells are positive for WT1, PAX8 and ER.  Immunostain for p53 shows a clonal overexpression pattern.  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  Clinical and radiologic correlation is suggested.    01/19/2021 Tumor Marker   Patient's tumor was tested for the following markers: CA-125. Results of the tumor marker test revealed 6790.   01/28/2021 PET scan   1. Examination is positive for extensive FDG avid peritoneal disease within the abdomen and pelvis including multiple implants upon the surface of liver and extensive omental caking. Moderate ascites noted within the pelvis. 2. FDG avid retroperitoneal, retrocrural, right internal mammary, and mediastinal  lymph nodes compatible with metastatic adenopathy. 3. Small right pleural effusion. 4.  Aortic Atherosclerosis (ICD10-I70.0).     01/31/2021 Initial Diagnosis   Primary peritoneal carcinomatosis ([name])    01/31/2021 Cancer Staging   Staging form: Ovary, Fallopian Tube, and Primary Peritoneal Carcinoma, AJCC 8th Edition - Clinical stage from 01/31/2021: FIGO Stage IV (cT2b, cN1b, cM1) - Signed by Heath Lark, MD on 01/31/2021 Stage prefix: Initial diagnosis    02/14/2021 Tumor Marker   Patient's tumor was tested for the following markers: CA-125. Results of the tumor marker test revealed 8668.   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 Remainder of testing was negative/normal. The report date is 02/09/2021.  The CancerNext-Expanded + RNAinsight gene panel offered by [employer] and includes sequencing and rearrangement analysis for the following 77 genes: IP, ALK, APC*, ATM*, AXIN2, BAP1, BARD1, BLM, BMPR1A, BRCA1*, BRCA2*, BRIP1*, CDC73, CDH1*,CDK4, CDKN1B, CDKN2A, CHEK2*, CTNNA1, DICER1, FANCC, FH, FLCN, GALNT12, KIF1B, LZTR1, MAX, MEN1, MET, MLH1*, MSH2*, MSH3, MSH6*, MUTYH*, NBN, NF1*, NF2, NTHL1, PALB2*, PHOX2B, PMS2*, POT1, PRKAR1A, PTCH1, PTEN*, RAD51C*, RAD51D*,RB1, RECQL, RET, SDHA, SDHAF2, SDHB, SDHC, SDHD, SMAD4, SMARCA4, SMARCB1, SMARCE1, STK11, SUFU, TMEM127, TP53*,TSC1, TSC2, VHL and XRCC2 (sequencing and deletion/duplication); EGFR, EGLN1, HOXB13, KIT, MITF, PDGFRA, POLD1 and POLE (sequencing only); EPCAM and GREM1 (deletion/duplication only).   03/10/2021 Tumor Marker   Patient's tumor was tested for the following markers: CA-125. Results of the tumor marker test revealed 2002.   04/14/2021 Tumor Marker   Patient's tumor was tested for the following markers: CA-125. Results of the tumor  marker test revealed  278.   04/26/2021 Imaging   Significant decrease in peritoneal carcinomatosis since previous study, with resolution of ascites.   Decreased anterior mediastinal and retrocrural lymphadenopathy.   No new or progressive metastatic disease within the chest, abdomen, or pelvis.   Aortic Atherosclerosis (ICD10-I70.0).   04/29/2021 Tumor Marker   Patient's tumor was tested for the following markers: CA-125. Results of the tumor marker test revealed 134.   05/12/2021 Surgery   Tumor debulking including robotic-assisted laparoscopic total hysterectomy with bilateral salpingoophorectomy, lysis of adhesions, excision and fulguration of peritoneal nodules, mini laparotomy for intra-abdominal palpation and omentectomy  Findings: : On exam under anesthesia, small mobile uterus.  On intra-abdominal entry, normal upper abdominal survey including liver and stomach.  Right diaphragm with small peritoneal studding, all lesions less than 5 mm.  Omentum with an approximately 5 cm mass adherent to the lower anterior abdominal wall and bladder peritoneum.  Normal 6 cm uterus.  Somewhat atrophic appearing bilateral adnexa with tumor implants.  Sigmoid and rectal colon and mesentery with moderate peritoneal disease, all lesions less than 5 mm.  Multiple peritoneal implants on bilateral pelvic sidewalls and in the posterior cul-de-sac, all measuring less than 5 mm.  Multiple omental implants measuring several mm.  Approximately 1 cm area within the transverse colon mesentery, unclear if this is related to scar tissue from prior colon resection versus tumor implant.  No obvious pelvic adenopathy, no palpable para-aortic adenopathy. R1 resection at the end of surgery   05/12/2021 Pathology Results   A. UTERUS, CERVIX, BILATERAL FALLOPIAN TUBES AND OVARIES:  - Fallopian tubes:       - Bilateral fallopian tubes involved by high-grade serous  carcinoma.  - Ovaries:       - Bilateral ovaries involved by high-grade serous  carcinoma.  - Uterine cervix:       - Benign transformation zone.       - Negative for squamous intraepithelial lesion and malignancy.  - Endometrium:       - Inactive endometrium.       - Negative for atypical hyperplasia/EIN and malignancy.  - Myometrium:       - Negative for malignancy.  - Uterine serosa:       - Involved by metastatic high-grade serous carcinoma.   B. PERITONEAL IMPLANT, EXCISION:  - Metastatic high-grade serous carcinoma.   C. OMENTUM, RESECTION:  - Metastatic high-grade serous carcinoma, multifocal.   COMMENT:   A. Immunohistochemical studies show tumor cells to be positive for CK7,  Pax-8, and WT-1, and negative for CK20, PR, and CDX-2. These findings  support the above diagnosis. A p53 shows strong and diffuse staining  within neoplastic cells, indicating mutated p53 status.   OVARY or FALLOPIAN TUBE or PRIMARY PERITONEUM: Resection   Procedure: Total hysterectomy and bilateral salpingo-oophorectomy  Specimen Integrity: Intact  Tumor Site: Favor right fallopian tube  Tumor Size: At least 1.7 cm  Histologic Type: High-grade serous carcinoma  Histologic Grade: High-grade  Ovarian Surface Involvement: Present, right and left  Fallopian Tube Surface Involvement: Present, right and left  Implants (required for advanced stage serous/seromucinous borderline  tumors only): Present: Peritoneal and omental  Other Tissue/ Organ Involvement: Bilateral ovaries, uterine serosa,  omentum, pelvic peritoneum  Largest Extrapelvic Peritoneal Focus: At least 6.7 cm  Peritoneal/Ascitic Fluid Involvement: Not submitted/unknown  Chemotherapy Response Score (CRS): CRS 1 (no definite or minimal  response)  Regional Lymph Nodes: Not applicable (no lymph nodes submitted or found)   Distant  Metastasis:       Distant Site(s) Involved: Not applicable  Pathologic Stage Classification (pTNM, AJCC 8th Edition): ypT3c, pN not  assigned  TNM classifiers: y (posttreatment)   Ancillary Studies: Can be performed upon request  Representative Tumor Block: A4  Comment(s): None    06/30/2021 Tumor Marker   Patient's tumor was tested for the following markers: CA-125. Results of the tumor marker test revealed 72.8.   08/11/2021 Tumor Marker   Patient's tumor was tested for the following markers: Ca-125. Results of the tumor marker test revealed 30.7.   09/08/2021 Tumor Marker   Patient's tumor was tested for the following markers: CA-125. Results of the tumor marker test revealed 21.4.   09/09/2021 Imaging   1. No new or progressive metastatic disease in the chest abdomen or pelvis. 2. Continued interval decrease in omental caking now with minimal residual nodular stranding. 3. Similar to minimally decreased mediastinal adenopathy. 4. Aortic Atherosclerosis (ICD10-I70.0).   10/04/2021 Tumor Marker   Patient's tumor was tested for the following markers: CA-125. Results of the tumor marker test revealed 22.2.   10/08/2021 -  Chemotherapy   She was started on lynparza   10/27/2021 Tumor Marker   Patient's tumor was tested for the following markers: CA-125. Results of the tumor marker test revealed 24.2.     Interval History: Patient presents today for surveillance visit.  She notes overall doing well.  She endorses a good appetite.  Has intermittent nausea since starting Lynparza.  Dramamine chews have worked very well and the patient has not needed to take any antiemetics.  Patient endorses a good appetite.  She just got back from her godson's wedding.  She reports normal bowel function.  Has continued urinary frequency, unchanged, as well as urinary incontinence.  Past Medical/Surgical History: Past Medical History:  Diagnosis Date   Abdominal pain    Adenomatous colon polyp    Anemia    Iron deficiency anemia   Anxiety    Cancer ([place])    colon   Colon cancer ([place]) 07/17/2013   Family history of breast cancer    Family history of colon cancer     Family history of melanoma    History of migraine headaches    Hypothyroidism    Internal hemorrhoids    Irregular heart beat    occ. 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[facility])     Past Surgical History:  Procedure Laterality Date   BREAST BIOPSY Right    BREAST SURGERY     removal abnormal cells in right   COLONOSCOPY     multiple   ESOPHAGOGASTRODUODENOSCOPY (EGD) WITH PROPOFOL N/A 12/21/2020   Procedure: ESOPHAGOGASTRODUODENOSCOPY (EGD) WITH PROPOFOL;  Surgeon: Carol Ada, MD;  Location: [facility];  Service: Endoscopy;  Laterality: N/A;   EUS N/A 12/21/2020   Procedure: UPPER ENDOSCOPIC ULTRASOUND (EUS) LINEAR;  Surgeon: Carol Ada, MD;  Location: [facility];  Service: Endoscopy;  Laterality: N/A;   LAPAROSCOPIC RIGHT COLECTOMY Right 07/17/2013   Procedure: LAPAROSCOPIC ASSISTED  RIGHT COLECTOMY;  Surgeon: Odis Hollingshead, MD;  Location: [facility];  Service: General;  Laterality: Right;   TONSILLECTOMY     teenager- age 66    Family History  Problem Relation Age of Onset   Cancer Brother        internal melanoma   Cancer Maternal Grandmother        colon   Ovarian cancer Neg Hx    Endometrial  cancer Neg Hx    Pancreatic cancer Neg Hx    Prostate cancer Neg Hx     Social History   Socioeconomic History   Marital status: Divorced    Spouse name: Not on file   Number of children: Not on file   Years of education: Not on file   Highest education level: Not on file  Occupational History   Occupation: owner of [employer]  Tobacco Use   Smoking status: Former    Types: Cigarettes    Quit date: 06/11/1997    Years since quitting: 24.4   Smokeless tobacco: Never  Vaping Use   Vaping Use: Never used  Substance and Sexual Activity   Alcohol use: Not Currently    Comment: 2 ounces daily - Scotch   Drug use: No   Sexual activity: Not Currently  Other Topics Concern   Not on file  Social History Narrative   Not on file    Social Determinants of Health   Financial Resource Strain: Not on file  Food Insecurity: Not on file  Transportation Needs: Not on file  Physical Activity: Not on file  Stress: Not on file  Social Connections: Not on file    Current Medications:  Current Outpatient Medications:    Calcium Carb-Cholecalciferol (CALCIUM 600+D3 PO), Take 1 tablet by mouth in the morning and at bedtime., Disp: , Rfl:    cetirizine (ZYRTEC) 10 MG tablet, Take 10 mg by mouth every 30 (thirty) days. As needed, Disp: , Rfl:    cholecalciferol (VITAMIN D3) 25 MCG (1000 UNIT) tablet, Take 1,000 Units by mouth in the morning., Disp: , Rfl:    diazepam (VALIUM) 5 MG tablet, Take 5 mg by mouth daily as needed for anxiety., Disp: , Rfl:    escitalopram (LEXAPRO) 10 MG tablet, Take 1.5 tablets (15 mg total) by mouth daily., Disp: 45 tablet, Rfl: 3   levothyroxine (SYNTHROID, LEVOTHROID) 75 MCG tablet, Take 75 mcg by mouth daily before breakfast., Disp: , Rfl: 3   olaparib (LYNPARZA) 100 MG tablet, Take 1 tablet (100 mg total) by mouth 2 (two) times daily. Swallow whole. May take with food to decrease nausea and vomiting., Disp: 60 tablet, Rfl: 11   ondansetron (ZOFRAN) 8 MG tablet, Take 1 tablet (8 mg total) by mouth every 8 (eight) hours as needed for refractory nausea / vomiting., Disp: 30 tablet, Rfl: 1   oxyCODONE-acetaminophen (PERCOCET/ROXICET) 5-325 MG tablet, Take 1 tablet by mouth every 6 (six) hours as needed for severe pain., Disp: 30 tablet, Rfl: 0   Polyethyl Glycol-Propyl Glycol (SYSTANE) 0.4-0.3 % SOLN, Place 1-2 drops into both eyes 3 (three) times daily as needed (dry/irritated eyes.)., Disp: , Rfl:    prochlorperazine (COMPAZINE) 10 MG tablet, Take 1 tablet (10 mg total) by mouth every 6 (six) hours as needed (Nausea or vomiting)., Disp: 30 tablet, Rfl: 1   valACYclovir (VALTREX) 1000 MG tablet, Take 1,000 mg by mouth daily as needed (Fever Blister)., Disp: , Rfl:    zolpidem (AMBIEN) 10 MG tablet,  Take 1 tablet (10 mg total) by mouth at bedtime as needed. for sleep, Disp: 30 tablet, Rfl: 2   clobetasol cream (TEMOVATE) 6.25 %, 1 application to affected area, Disp: , Rfl:    SUNSCREEN SPF50 EX, , Disp: , Rfl:   Review of Systems: + Urinary frequency and incontinence Denies appetite changes, fevers, chills, fatigue, unexplained weight changes. Denies hearing loss, neck lumps or masses, mouth sores, ringing in ears or voice  changes. Denies cough or wheezing.  Denies shortness of breath. Denies chest pain or palpitations. Denies leg swelling. Denies abdominal distention, pain, blood in stools, constipation, diarrhea, vomiting, or early satiety. Denies pain with intercourse, dysuria. Denies hot flashes, pelvic pain, vaginal bleeding or vaginal discharge.   Denies joint pain, back pain or muscle pain/cramps. Denies itching, rash, or wounds. Denies dizziness, headaches, numbness or seizures. Denies swollen lymph nodes or glands, denies easy bruising or bleeding. Denies anxiety, depression, confusion, or decreased concentration.  Physical Exam: BP (!) 143/81 (BP Location: Left Arm, Patient Position: Sitting)   Pulse 60   Temp 98.1 F (36.7 C) (Oral)   Resp 16   Ht 5' 5.5" (1.664 m)   Wt 159 lb 3.2 oz (72.2 kg)   SpO2 97%   BMI 26.09 kg/m  General: Alert, oriented, no acute distress. HEENT: Normocephalic, atraumatic, sclera anicteric. Chest: Clear to auscultation bilaterally.  No wheezes or rhonchi Cardiovascular: Regular rate and rhythm, no murmurs. Abdomen: soft, nontender.  Normoactive bowel sounds.  No masses or hepatosplenomegaly appreciated.  Well-healed incisions. Extremities: Grossly normal range of motion.  Warm, well perfused.  No edema bilaterally. Skin: No rashes or lesions noted. Lymphatics: No cervical, supraclavicular, or inguinal adenopathy. GU: Normal appearing external genitalia without erythema, excoriation, or lesions.  Speculum exam reveals moderately atrophic  vaginal mucosa, no lesions.  No blood or discharge.  Bimanual exam reveals cuff intact, no masses or nodularity.  Rectovaginal exam  confirms findings.  Laboratory & Radiologic Studies: Component Ref Range & Units 3 wk ago 1 mo ago 2 mo ago 3 mo ago 4 mo ago 6 mo ago 7 mo ago  Cancer Antigen (CA) 125 0.0 - 38.1 U/mL 24.2  22.2 CM  21.4 CM  30.7 CM  72.8 High  CM  134.0 High  CM  278.0 High  CM     Assessment & Plan: Leslie Rivera is a 76 y.o. woman with advanced stage fallopian tube cancer who has now completed adjuvant chemotherapy in 07/2021 with complete clinical response.  Imaging in 08/2021 negative.  Patient presents for surveillance today. Somatic testing: HRD+  Patient is overall doing well and is NED on exam today.  She is doing well on her maintenance Lynparza in the setting of her HRD status on somatic testing.  In terms of her urinary symptoms, the patient had previously been seen by urology and will found pelvic floor physical therapy very helpful.  At her last visit, we had talked about returning for pelvic floor physical therapy or potentially talking about other options for incontinence such as a pessary or urethral bulking.  The patient will call to see if she can get back in for pelvic floor physical therapy.  Per NCCN surveillance recommendations, we will continue with visits every 3 months alternating between my office and Dr. Alvy Bimler.  We reviewed signs and symptoms that should prompt a phone call sooner than her neck scheduled visit.  28 minutes of total time was spent for this patient encounter, including preparation, face-to-face counseling with the patient and coordination of care, and documentation of the encounter.  Jeral Pinch, MD  Division of Gynecologic Oncology  Department of Obstetrics and Gynecology  [facility]

## 2021-11-14 NOTE — Patient Instructions (Signed)
It was good to see you today.  I do not see or feel any evidence of cancer recurrence on your exam.  I will plan to see you back in 6 months.  My schedule is not out that far.  Please call back sometime in September to get a visit scheduled with me in November.

## 2021-12-01 ENCOUNTER — Other Ambulatory Visit: Payer: Self-pay | Admitting: Hematology and Oncology

## 2021-12-02 ENCOUNTER — Encounter: Payer: Self-pay | Admitting: Hematology and Oncology

## 2021-12-05 ENCOUNTER — Inpatient Hospital Stay: Payer: Medicare Other | Attending: Genetic Counselor

## 2021-12-05 ENCOUNTER — Inpatient Hospital Stay: Payer: Medicare Other

## 2021-12-05 ENCOUNTER — Other Ambulatory Visit: Payer: Self-pay | Admitting: Pharmacist

## 2021-12-05 ENCOUNTER — Other Ambulatory Visit: Payer: Self-pay

## 2021-12-05 ENCOUNTER — Inpatient Hospital Stay: Payer: Medicare Other | Admitting: Pharmacist

## 2021-12-05 VITALS — BP 124/86 | HR 77 | Temp 97.5°F | Resp 18 | Ht 65.5 in | Wt 157.2 lb

## 2021-12-05 DIAGNOSIS — C5700 Malignant neoplasm of unspecified fallopian tube: Secondary | ICD-10-CM | POA: Insufficient documentation

## 2021-12-05 DIAGNOSIS — Z79899 Other long term (current) drug therapy: Secondary | ICD-10-CM | POA: Diagnosis not present

## 2021-12-05 DIAGNOSIS — C482 Malignant neoplasm of peritoneum, unspecified: Secondary | ICD-10-CM

## 2021-12-05 DIAGNOSIS — E538 Deficiency of other specified B group vitamins: Secondary | ICD-10-CM | POA: Insufficient documentation

## 2021-12-05 DIAGNOSIS — Z87891 Personal history of nicotine dependence: Secondary | ICD-10-CM | POA: Insufficient documentation

## 2021-12-05 DIAGNOSIS — C5701 Malignant neoplasm of right fallopian tube: Secondary | ICD-10-CM

## 2021-12-05 DIAGNOSIS — R5383 Other fatigue: Secondary | ICD-10-CM

## 2021-12-05 LAB — CBC WITH DIFFERENTIAL (CANCER CENTER ONLY)
Abs Immature Granulocytes: 0.01 10*3/uL (ref 0.00–0.07)
Basophils Absolute: 0 10*3/uL (ref 0.0–0.1)
Basophils Relative: 1 %
Eosinophils Absolute: 0.1 10*3/uL (ref 0.0–0.5)
Eosinophils Relative: 2 %
HCT: 36.8 % (ref 36.0–46.0)
Hemoglobin: 12.5 g/dL (ref 12.0–15.0)
Immature Granulocytes: 0 %
Lymphocytes Relative: 41 %
Lymphs Abs: 1.3 10*3/uL (ref 0.7–4.0)
MCH: 34.5 pg — ABNORMAL HIGH (ref 26.0–34.0)
MCHC: 34 g/dL (ref 30.0–36.0)
MCV: 101.7 fL — ABNORMAL HIGH (ref 80.0–100.0)
Monocytes Absolute: 0.3 10*3/uL (ref 0.1–1.0)
Monocytes Relative: 9 %
Neutro Abs: 1.6 10*3/uL — ABNORMAL LOW (ref 1.7–7.7)
Neutrophils Relative %: 47 %
Platelet Count: 250 10*3/uL (ref 150–400)
RBC: 3.62 MIL/uL — ABNORMAL LOW (ref 3.87–5.11)
RDW: 12.4 % (ref 11.5–15.5)
WBC Count: 3.3 10*3/uL — ABNORMAL LOW (ref 4.0–10.5)
nRBC: 0 % (ref 0.0–0.2)

## 2021-12-05 LAB — CMP (CANCER CENTER ONLY)
ALT: 11 U/L (ref 0–44)
AST: 16 U/L (ref 15–41)
Albumin: 4.1 g/dL (ref 3.5–5.0)
Alkaline Phosphatase: 66 U/L (ref 38–126)
Anion gap: 9 (ref 5–15)
BUN: 21 mg/dL (ref 8–23)
CO2: 23 mmol/L (ref 22–32)
Calcium: 9.8 mg/dL (ref 8.9–10.3)
Chloride: 107 mmol/L (ref 98–111)
Creatinine: 1.15 mg/dL — ABNORMAL HIGH (ref 0.44–1.00)
GFR, Estimated: 50 mL/min — ABNORMAL LOW (ref >60)
Glucose, Bld: 105 mg/dL — ABNORMAL HIGH (ref 70–99)
Potassium: 3.8 mmol/L (ref 3.5–5.1)
Sodium: 139 mmol/L (ref 135–145)
Total Bilirubin: 0.4 mg/dL (ref 0.3–1.2)
Total Protein: 7.1 g/dL (ref 6.5–8.1)

## 2021-12-05 LAB — TSH: TSH: 4.518 u[IU]/mL — ABNORMAL HIGH (ref 0.350–4.500)

## 2021-12-05 MED ORDER — CYANOCOBALAMIN 1000 MCG/ML IJ SOLN
1000.0000 ug | Freq: Once | INTRAMUSCULAR | Status: AC
  Administered 2021-12-05: 1000 ug via INTRAMUSCULAR
  Filled 2021-12-05: qty 1

## 2021-12-05 MED ORDER — OXYCODONE-ACETAMINOPHEN 5-325 MG PO TABS
1.0000 | ORAL_TABLET | Freq: Four times a day (QID) | ORAL | 0 refills | Status: DC | PRN
Start: 2021-12-05 — End: 2022-03-14

## 2021-12-05 NOTE — Progress Notes (Signed)
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 Heath Rivera.    Current treatment regimen and start date Olaparib (10/08/21)   Interval History She continues on olaparib 100 mg by mouth every 12 hours on days 1 to 28 of a 28-day cycle. This is being given  monotherapy. Therapy is planned to continue until disease progression or unacceptable toxicity.   Response to Therapy Leslie Rivera is here today for follow-up to her olaparib prescription.  She was last seen by Dr. Alvy Rivera on 11/07/21 and clinical pharmacy on 10/24/21.  She continues to receive monthly B12 injections which she will receive today.  Overall she is doing well.  She does report some pain mainly in the evening that is waking her up at night.  She has been taking 1/4 of oxycodone/acetaminophen throughout the day or.  She states that this is not enough as she is waking up in the middle the night.  We discussed today some possible options such as increasing her dose to one half of the dose or a full dose of oxycodone/acetaminophen.  She states she will try this and let the clinic know how this works.  She is also reporting some fatigue and this may be due to her not sleeping well at night.  We discussed that if the oxycodone/acetaminophen is not working, she can always contact the clinic sooner and we can potentially prescribe something different if Dr. Alvy Rivera is in agreement.  She has follow-up scans scheduled for mid July and she will see Dr. Alvy Rivera soon thereafter to review those scans and to receive her next B12 injection, which she again receives monthly.  She also continues to have some nausea with no vomiting and she has been taking Dramamine chews for  this for quite some time and it is helpful.  She has not needed to take ondansetron or prochlorperazine as needed yet.  She does have this at home should she need the antinausea medications.  Her serum creatinine was slightly elevated today however it is lower than it was at her last visit.  We did offer IV fluids today but she will drink fluids at home and if needed she can always come in for IV fluids at a later time.  She does not report any dizziness or signs of dehydration at this time. Labs, vitals, treatment parameters, and manufacturer guidelines assessing toxicity were reviewed with Leslie Rivera today. Based on these values, patient is in agreement to continue olaparib therapy at this time.  Allergies Allergies  Allergen Reactions   Barium-Containing Compounds     Legs and arm cramping & caused fall   Hydrocodone Nausea And Vomiting    Can tolerate Oxycodone   Erythromycin     GI upset   Zofran [Ondansetron]     Other reaction(s): Headache-Never wants  again    Vitals    12/05/2021    8:56 AM 11/14/2021    3:05 PM 11/07/2021    9:42 AM  Vitals with BMI  Height 5' 5.5" 5' 5.5" '5\' 5"'$   Weight 157 lbs 3 oz 159 lbs 3 oz 155 lbs 3 oz  BMI 25.75 58.09 98.33  Systolic 825 053 976  Diastolic 86 81 81  Pulse 77  60 80     Laboratory Data    Latest Ref Rng & Units 12/05/2021    8:29 AM 11/07/2021    9:25 AM 10/24/2021    8:54 AM  CBC EXTENDED  WBC 4.0 - 10.5 K/uL 3.3  4.1  4.2   RBC 3.87 - 5.11 MIL/uL 3.62  3.60  3.32   Hemoglobin 12.0 - 15.0 g/dL 12.5  12.8  11.8   HCT 36.0 - 46.0 % 36.8  37.7  35.2   Platelets 150 - 400 K/uL 250  265  249   NEUT# 1.7 - 7.7 K/uL 1.6  1.8  2.2   Lymph# 0.7 - 4.0 K/uL 1.3  1.8  1.5        Latest Ref Rng & Units 12/05/2021    8:29 AM 11/07/2021    9:25 AM 10/24/2021    8:54 AM  CMP  Glucose 70 - 99 mg/dL 105  107  98   BUN 8 - 23 mg/dL '21  22  19   '$ Creatinine 0.44 - 1.00 mg/dL 1.15  1.21  0.83   Sodium 135 - 145 mmol/L 139  138  138    Potassium 3.5 - 5.1 mmol/L 3.8  4.6  4.1   Chloride 98 - 111 mmol/L 107  105  107   CO2 22 - 32 mmol/L '23  25  23   '$ Calcium 8.9 - 10.3 mg/dL 9.8  10.1  9.6   Total Protein 6.5 - 8.1 g/dL 7.1  7.7  7.2   Total Bilirubin 0.3 - 1.2 mg/dL 0.4  0.5  0.5   Alkaline Phos 38 - 126 U/L 66  62  66   AST 15 - 41 U/L '16  15  15   '$ ALT 0 - 44 U/L '11  10  10     '$ No results found for: "MG"   Adverse Effects Assessment Fatigue: Likely due from her waking up in the middle the night due to the pain see second bullet point.   Pain: She has been taking one fourth of oxycodone/acetaminophen as stated above.  This is helping throughout the day but she is waking up in the middle the night with pain.  We recommended trying one half oxycodone/acetaminophen 30 minutes prior to bedtime and if needed she can increase to 1 tablet 30 minutes prior to bedtime.  She will let the clinic know how this works and if needed she will see Dr. Alvy Rivera for additional recommendations in 1 month Serum creatinine elevation: Slightly less than her last visit.  Encouraged to continue to drink plenty of fluids and monitor urine output.  She is not describing any signs of dehydration such as dizziness or balance issues today.  Adherence Assessment Leslie Rivera reports missing 0 doses over the past 4 weeks.   Reason for missed dose: N/A Patient was re-educated on importance of adherence.   Access Assessment Leslie Rivera is currently receiving her olaparib through [facility] concerns: None  Medication Reconciliation [name] medication list was reviewed today with the patient?  Yes New medications or herbal supplements have recently been started?  No Any medications have been discontinued?  No The medication list was updated and reconciled based on the patient's most recent medication list in the electronic medical record (EMR) including herbal products and OTC medications.    Medications Current Outpatient Medications  Medication Sig Dispense Refill   Calcium Carb-Cholecalciferol (CALCIUM 600+D3 PO) Take 1 tablet by mouth in the  morning and at bedtime.     cholecalciferol (VITAMIN D3) 25 MCG (1000 UNIT) tablet Take 5,000 Units by mouth in the morning.     clobetasol cream (TEMOVATE) 0.53 % 1 application to affected area     escitalopram (LEXAPRO) 10 MG tablet TAKE 1 & 1/2 (ONE & ONE-HALF) TABLETS BY MOUTH ONCE DAILY 45 tablet 0   levothyroxine (SYNTHROID, LEVOTHROID) 75 MCG tablet Take 75 mcg by mouth daily before breakfast.  3   olaparib (LYNPARZA) 100 MG tablet Take 1 tablet (100 mg total) by mouth 2 (two) times daily. Swallow whole. May take with food to decrease nausea and vomiting. 60 tablet 11   oxyCODONE-acetaminophen (PERCOCET/ROXICET) 5-325 MG tablet Take 1 tablet by mouth every 6 (six) hours as needed for severe pain. 30 tablet 0   SUNSCREEN SPF50 EX      zolpidem (AMBIEN) 10 MG tablet Take 1 tablet (10 mg total) by mouth at bedtime as needed. for sleep 30 tablet 2   cetirizine (ZYRTEC) 10 MG tablet Take 10 mg by mouth every 30 (thirty) days. As needed (Patient not taking: Reported on 12/05/2021)     diazepam (VALIUM) 5 MG tablet Take 5 mg by mouth daily as needed for anxiety. (Patient not taking: Reported on 12/05/2021)     ondansetron (ZOFRAN) 8 MG tablet Take 1 tablet (8 mg total) by mouth every 8 (eight) hours as needed for refractory nausea / vomiting. (Patient not taking: Reported on 12/05/2021) 30 tablet 1   Polyethyl Glycol-Propyl Glycol (SYSTANE) 0.4-0.3 % SOLN Place 1-2 drops into both eyes 3 (three) times daily as needed (dry/irritated eyes.). (Patient not taking: Reported on 12/05/2021)     prochlorperazine (COMPAZINE) 10 MG tablet Take 1 tablet (10 mg total) by mouth every 6 (six) hours as needed (Nausea or vomiting). (Patient not taking: Reported on 12/05/2021) 30 tablet 1   valACYclovir (VALTREX) 1000 MG tablet Take 1,000 mg by mouth daily as  needed (Fever Blister). (Patient not taking: Reported on 12/05/2021)     No current facility-administered medications for this visit.    Drug-Drug Interactions (DDIs) DDIs were evaluated?  Yes Significant DDIs?  No The patient was instructed to speak with their health care provider and/or the oral chemotherapy pharmacist before starting any new drug, including prescription or over the counter, natural / herbal products, or vitamins.  Supportive Care N/V/D Fatigue Anemia Headache Neutropenia Arthralgia  Dosing Assessment Hepatic adjustments needed?  No Renal adjustments needed?  No Toxicity adjustments needed?  No The current dosing regimen is appropriate to continue at this time.  Follow-Up Plan Continue olaparib 100 mg every 12 hours Continue monthly B12 injections.  Will receive B12 today and again in 4 weeks when she sees Dr. Alvy Rivera Continue to monitor serum creatinine elevations.  Trending down from last visit. We will consider increasing her dose of oxycodone/acetaminophen as above.  30 minutes before bedtime.  She will contact the clinic should this not work and we can discuss with Dr. Alvy Rivera some other pain medication options. Scans are scheduled for 01/06/22 and follow-up with Dr. Alvy Rivera on 01/09/22 to review.  This is when she will also receive labs and her next B12 injection We will add labs, pharmacy clinic visit, and B12 injection for 8 weeks from today.  Leslie Rivera participated in the discussion, expressed understanding, and voiced agreement with the above plan. All questions were answered to her satisfaction. The patient was advised to contact the clinic at [phone] with any questions or  concerns prior to her return visit.   I spent 30 minutes assessing and educating the patient.  Raina Mina, RPH-CPP, 12/05/2021  9:33 AM   **Disclaimer: This note was dictated with voice recognition software. Similar sounding words can inadvertently be transcribed  and this note may contain transcription errors which may not have been corrected upon publication of note.**

## 2021-12-06 ENCOUNTER — Telehealth: Payer: Self-pay

## 2021-12-06 LAB — CA 125: Cancer Antigen (CA) 125: 25.1 U/mL (ref 0.0–38.1)

## 2021-12-06 NOTE — Telephone Encounter (Signed)
-----   Message from Heath Lark, MD sent at 12/06/2021  9:05 AM EDT ----- Pls let her know CA-125 looks good

## 2021-12-06 NOTE — Telephone Encounter (Signed)
Patient informed of her recent lab results. Patient appreciative of a call.

## 2021-12-13 ENCOUNTER — Ambulatory Visit (INDEPENDENT_AMBULATORY_CARE_PROVIDER_SITE_OTHER): Payer: Medicare Other | Admitting: Sports Medicine

## 2021-12-13 VITALS — BP 120/82 | Ht 65.5 in | Wt 155.0 lb

## 2021-12-13 DIAGNOSIS — M7502 Adhesive capsulitis of left shoulder: Secondary | ICD-10-CM | POA: Diagnosis not present

## 2021-12-13 NOTE — Progress Notes (Signed)
   Subjective:    Patient ID: Leslie Rivera, female    DOB: January 25, 1946, 76 y.o.   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  Overall, she reports about a 20% improvement.  She has noticed some improvement in her range of motion but still experiences intermittent pain at times in the shoulder.  She admits that she has not been doing her range of motion exercises as much as she would like.  Recent x-rays of her shoulder showed no appreciable glenohumeral osteoarthritis.  She also continues to complain of diffuse joint aches secondary to her [place]  800 mg of ibuprofen does help but this causes stomach upset.  She recently purchased an over-the-counter topical product with Arnica in it and that has been helpful.  She is scheduled to undergo another scan with her oncologist next month.   Review of Systems As above    Objective:   Physical Exam  Well-developed, well-nourished.  No acute distress  Active and passive forward flexion is once again noted to be to about 120 degrees.  Passive external rotation is 60 to 70 degrees.  Internal rotation 80 degrees but somewhat painful.  Abduction is limited to about 90 degrees.  Good strength.  Neurovascularly intact distally.      Assessment & Plan:   Left shoulder pain and stiffness secondary to adhesive capsulitis History of fallopian tube cancer-now in remission  I discussed the addition of a glenohumeral cortisone injection or amitriptyline.  Given the fact that she is not experiencing much in the way of pain and that most of her symptoms are from the stiffness of adhesive capsulitis, I am not overly optimistic that a cortisone injection will be helpful.  She does not want to try amitriptyline at this time.  She will instead try to be more diligent about doing her home exercises.  I demonstrated for her how to do a table walk to help her regain forward flexion and she will  add that to her home exercise regimen.  I would like to see her again in 6 weeks for reevaluation.  She will need to limit the amount of ibuprofen she takes since it does cause stomach upset.  Hopefully her follow-up scan with oncology next month continues to show her cancer to be in remission and she will plan on discussing the length of time that she will need to be on her [place] at that visit.  This note was dictated using Dragon naturally speaking software and may contain errors in syntax, spelling, or content which have not been identified prior to signing this note.

## 2021-12-28 ENCOUNTER — Telehealth: Payer: Self-pay

## 2021-12-28 NOTE — Telephone Encounter (Signed)
Returned her call. She is asking about getting B12 injection. Told her that she is scheduled monthly and the next appt is 7/17. She verbalized understanding.

## 2022-01-06 ENCOUNTER — Ambulatory Visit (HOSPITAL_COMMUNITY)
Admission: RE | Admit: 2022-01-06 | Discharge: 2022-01-06 | Disposition: A | Discharge status: Home / Self Care | Payer: Medicare Other | Source: Ambulatory Visit | Attending: Hematology and Oncology | Admitting: Hematology and Oncology

## 2022-01-06 ENCOUNTER — Other Ambulatory Visit: Payer: Self-pay

## 2022-01-06 ENCOUNTER — Inpatient Hospital Stay: Payer: Medicare Other | Attending: Genetic Counselor

## 2022-01-06 ENCOUNTER — Telehealth: Payer: Self-pay | Admitting: Pharmacist

## 2022-01-06 DIAGNOSIS — Z79890 Hormone replacement therapy (postmenopausal): Secondary | ICD-10-CM | POA: Insufficient documentation

## 2022-01-06 DIAGNOSIS — C482 Malignant neoplasm of peritoneum, unspecified: Secondary | ICD-10-CM

## 2022-01-06 DIAGNOSIS — M2550 Pain in unspecified joint: Secondary | ICD-10-CM | POA: Insufficient documentation

## 2022-01-06 DIAGNOSIS — R911 Solitary pulmonary nodule: Secondary | ICD-10-CM | POA: Insufficient documentation

## 2022-01-06 DIAGNOSIS — C5701 Malignant neoplasm of right fallopian tube: Secondary | ICD-10-CM | POA: Insufficient documentation

## 2022-01-06 DIAGNOSIS — Z9221 Personal history of antineoplastic chemotherapy: Secondary | ICD-10-CM | POA: Insufficient documentation

## 2022-01-06 DIAGNOSIS — E559 Vitamin D deficiency, unspecified: Secondary | ICD-10-CM | POA: Insufficient documentation

## 2022-01-06 DIAGNOSIS — C569 Malignant neoplasm of unspecified ovary: Secondary | ICD-10-CM | POA: Diagnosis not present

## 2022-01-06 DIAGNOSIS — E538 Deficiency of other specified B group vitamins: Secondary | ICD-10-CM | POA: Insufficient documentation

## 2022-01-06 DIAGNOSIS — I700 Atherosclerosis of aorta: Secondary | ICD-10-CM | POA: Insufficient documentation

## 2022-01-06 DIAGNOSIS — R5383 Other fatigue: Secondary | ICD-10-CM | POA: Insufficient documentation

## 2022-01-06 DIAGNOSIS — Z79899 Other long term (current) drug therapy: Secondary | ICD-10-CM | POA: Insufficient documentation

## 2022-01-06 DIAGNOSIS — J984 Other disorders of lung: Secondary | ICD-10-CM | POA: Diagnosis not present

## 2022-01-06 DIAGNOSIS — Z8589 Personal history of malignant neoplasm of other organs and systems: Secondary | ICD-10-CM | POA: Insufficient documentation

## 2022-01-06 LAB — CBC WITH DIFFERENTIAL (CANCER CENTER ONLY)
Abs Immature Granulocytes: 0.02 10*3/uL (ref 0.00–0.07)
Basophils Absolute: 0 10*3/uL (ref 0.0–0.1)
Basophils Relative: 1 %
Eosinophils Absolute: 0 10*3/uL (ref 0.0–0.5)
Eosinophils Relative: 1 %
HCT: 36.9 % (ref 36.0–46.0)
Hemoglobin: 12.5 g/dL (ref 12.0–15.0)
Immature Granulocytes: 1 %
Lymphocytes Relative: 41 %
Lymphs Abs: 1.8 10*3/uL (ref 0.7–4.0)
MCH: 34.3 pg — ABNORMAL HIGH (ref 26.0–34.0)
MCHC: 33.9 g/dL (ref 30.0–36.0)
MCV: 101.4 fL — ABNORMAL HIGH (ref 80.0–100.0)
Monocytes Absolute: 0.4 10*3/uL (ref 0.1–1.0)
Monocytes Relative: 10 %
Neutro Abs: 2 10*3/uL (ref 1.7–7.7)
Neutrophils Relative %: 46 %
Platelet Count: 262 10*3/uL (ref 150–400)
RBC: 3.64 MIL/uL — ABNORMAL LOW (ref 3.87–5.11)
RDW: 13.8 % (ref 11.5–15.5)
WBC Count: 4.3 10*3/uL (ref 4.0–10.5)
nRBC: 0 % (ref 0.0–0.2)

## 2022-01-06 LAB — CMP (CANCER CENTER ONLY)
ALT: 11 U/L (ref 0–44)
AST: 15 U/L (ref 15–41)
Albumin: 4 g/dL (ref 3.5–5.0)
Alkaline Phosphatase: 72 U/L (ref 38–126)
Anion gap: 9 (ref 5–15)
BUN: 23 mg/dL (ref 8–23)
CO2: 21 mmol/L — ABNORMAL LOW (ref 22–32)
Calcium: 9.7 mg/dL (ref 8.9–10.3)
Chloride: 109 mmol/L (ref 98–111)
Creatinine: 0.92 mg/dL (ref 0.44–1.00)
GFR, Estimated: >60 mL/min (ref >60)
Glucose, Bld: 98 mg/dL (ref 70–99)
Potassium: 3.9 mmol/L (ref 3.5–5.1)
Sodium: 139 mmol/L (ref 135–145)
Total Bilirubin: 0.6 mg/dL (ref 0.3–1.2)
Total Protein: 7.2 g/dL (ref 6.5–8.1)

## 2022-01-06 MED ORDER — IOHEXOL 300 MG/ML  SOLN
100.0000 mL | Freq: Once | INTRAMUSCULAR | Status: AC | PRN
  Administered 2022-01-06: 100 mL via INTRAVENOUS

## 2022-01-06 NOTE — Telephone Encounter (Signed)
Per 7/14 phone line.  Pt called to r/s because she will be out of town.  R/s per pt request.  She confirmed date and time

## 2022-01-07 LAB — CA 125: Cancer Antigen (CA) 125: 29.2 U/mL (ref 0.0–38.1)

## 2022-01-09 ENCOUNTER — Encounter: Payer: Self-pay | Admitting: Hematology and Oncology

## 2022-01-09 ENCOUNTER — Inpatient Hospital Stay: Payer: Medicare Other

## 2022-01-09 ENCOUNTER — Other Ambulatory Visit: Payer: Self-pay

## 2022-01-09 ENCOUNTER — Inpatient Hospital Stay: Payer: Medicare Other | Admitting: Hematology and Oncology

## 2022-01-09 VITALS — BP 130/76 | HR 67 | Temp 98.6°F | Resp 16 | Ht 65.0 in | Wt 156.6 lb

## 2022-01-09 DIAGNOSIS — C5701 Malignant neoplasm of right fallopian tube: Secondary | ICD-10-CM

## 2022-01-09 DIAGNOSIS — R911 Solitary pulmonary nodule: Secondary | ICD-10-CM | POA: Diagnosis not present

## 2022-01-09 DIAGNOSIS — E538 Deficiency of other specified B group vitamins: Secondary | ICD-10-CM | POA: Diagnosis not present

## 2022-01-09 DIAGNOSIS — R5383 Other fatigue: Secondary | ICD-10-CM

## 2022-01-09 DIAGNOSIS — Z79899 Other long term (current) drug therapy: Secondary | ICD-10-CM | POA: Diagnosis not present

## 2022-01-09 DIAGNOSIS — C482 Malignant neoplasm of peritoneum, unspecified: Secondary | ICD-10-CM | POA: Diagnosis not present

## 2022-01-09 DIAGNOSIS — E559 Vitamin D deficiency, unspecified: Secondary | ICD-10-CM | POA: Diagnosis not present

## 2022-01-09 DIAGNOSIS — Z8589 Personal history of malignant neoplasm of other organs and systems: Secondary | ICD-10-CM | POA: Diagnosis not present

## 2022-01-09 DIAGNOSIS — I700 Atherosclerosis of aorta: Secondary | ICD-10-CM | POA: Diagnosis not present

## 2022-01-09 DIAGNOSIS — Z9221 Personal history of antineoplastic chemotherapy: Secondary | ICD-10-CM | POA: Diagnosis not present

## 2022-01-09 DIAGNOSIS — M2550 Pain in unspecified joint: Secondary | ICD-10-CM | POA: Diagnosis not present

## 2022-01-09 DIAGNOSIS — Z79890 Hormone replacement therapy (postmenopausal): Secondary | ICD-10-CM | POA: Diagnosis not present

## 2022-01-09 MED ORDER — CYANOCOBALAMIN 1000 MCG/ML IJ SOLN
1000.0000 ug | Freq: Once | INTRAMUSCULAR | Status: AC
  Administered 2022-01-09: 1000 ug via INTRAMUSCULAR
  Filled 2022-01-09: qty 1

## 2022-01-09 MED ORDER — ZOLPIDEM TARTRATE 10 MG PO TABS: 10.0000 mg | ORAL_TABLET | Freq: Every evening | ORAL | 2 refills | Status: DC | PRN

## 2022-01-09 NOTE — Assessment & Plan Note (Addendum)
I have reviewed her blood work and CT imaging with the patient and her sister There is no signs of cancer recurrence The lymphadenopathy in the mediastinum and small nodule in the fissure of the left lung are likely nonmalignant I plan to repeat imaging study in 6 months, due in January 2024 She will continue [place] as directed

## 2022-01-09 NOTE — Assessment & Plan Note (Signed)
She has diffuse bone pain I will check vitamin D level in her next visit

## 2022-01-09 NOTE — Assessment & Plan Note (Signed)
She has persistent fatigue despite normal CBC and thyroid function I told her that her fatigue is unrelated

## 2022-01-09 NOTE — Progress Notes (Signed)
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[facility]) I have reviewed her blood work and CT imaging with the patient and her sister There is no signs of cancer recurrence The lymphadenopathy in the mediastinum and small nodule in the fissure of the left lung are likely nonmalignant I plan to repeat imaging study in 6 months, due in January 2024 She will continue [place] as directed  Vitamin B12 deficiency She is receiving vitamin B12 injection I plan to recheck vitamin B12 level next month  Vitamin D deficiency She has diffuse bone pain I will check vitamin D level in her next visit  Other fatigue She has persistent fatigue despite normal CBC and thyroid function I told her that her fatigue is unrelated  Orders Placed This Encounter  Procedures   VITAMIN D 25 Hydroxy (Vit-D Deficiency, Fractures)    Standing Status:   Future    Standing Expiration Date:   01/10/2023   CA 125    Standing Status:   Standing    Number of Occurrences:   11    Standing Expiration Date:   01/10/2023   Vitamin B12    Standing Status:   Standing    Number of Occurrences:   11    Standing Expiration Date:   01/10/2023    All questions were answered. The patient knows to call the clinic with any problems, questions or concerns. The total time spent in the appointment was 30 minutes encounter with patients including review of chart and various tests results, discussions about plan of care and coordination of care plan   Heath Lark, MD 01/09/2022 10:43 AM  INTERVAL HISTORY: Please see below for problem oriented charting. she returns for treatment follow-up on olaparib and monthly B12 injection She is doing well She complains of fatigue and  occasional joint pain Denies abdominal pain or changes in bowel habits  REVIEW OF SYSTEMS:   Constitutional: Denies fevers, chills or abnormal weight loss Eyes: Denies blurriness of vision Ears, nose, mouth, throat, and face: Denies mucositis or sore throat Respiratory: Denies cough, dyspnea or wheezes Cardiovascular: Denies palpitation, chest discomfort or lower extremity swelling Gastrointestinal:  Denies nausea, heartburn or change in bowel habits Skin: Denies abnormal skin rashes Lymphatics: Denies new lymphadenopathy or easy bruising Neurological:Denies numbness, tingling or new weaknesses Behavioral/Psych: Mood is stable, no new changes  All other systems were reviewed with the patient and are negative.  I have reviewed the past medical history, past surgical history, social history and family history with the patient and they are unchanged from previous note.  ALLERGIES:  is allergic to barium-containing compounds, hydrocodone, erythromycin, and zofran [ondansetron].  MEDICATIONS:  Current Outpatient Medications  Medication Sig Dispense Refill   Calcium Carb-Cholecalciferol (CALCIUM 600+D3 PO) Take 1 tablet by mouth in the morning and at bedtime.     cetirizine (ZYRTEC) 10 MG tablet Take 10 mg by mouth every 30 (thirty) days. As needed (Patient not taking: Reported on 12/05/2021)     cholecalciferol (VITAMIN D3) 25 MCG (1000 UNIT) tablet Take 5,000 Units by mouth in the morning.     clobetasol cream (TEMOVATE) 5.57 % 1 application to affected area     diazepam (VALIUM) 5 MG tablet Take 5 mg by mouth daily as needed for anxiety. (Patient not taking:  Reported on 12/05/2021)     escitalopram (LEXAPRO) 10 MG tablet TAKE 1 & 1/2 (ONE & ONE-HALF) TABLETS BY MOUTH ONCE DAILY 45 tablet 0   levothyroxine (SYNTHROID, LEVOTHROID) 75 MCG tablet Take 75 mcg by mouth daily before breakfast.  3   olaparib (LYNPARZA) 100 MG tablet Take 1 tablet (100 mg total) by mouth 2 (two) times daily. Swallow  whole. May take with food to decrease nausea and vomiting. 60 tablet 11   ondansetron (ZOFRAN) 8 MG tablet Take 1 tablet (8 mg total) by mouth every 8 (eight) hours as needed for refractory nausea / vomiting. (Patient not taking: Reported on 12/05/2021) 30 tablet 1   oxyCODONE-acetaminophen (PERCOCET/ROXICET) 5-325 MG tablet Take 1 tablet by mouth every 6 (six) hours as needed for severe pain. 30 tablet 0   Polyethyl Glycol-Propyl Glycol (SYSTANE) 0.4-0.3 % SOLN Place 1-2 drops into both eyes 3 (three) times daily as needed (dry/irritated eyes.). (Patient not taking: Reported on 12/05/2021)     prochlorperazine (COMPAZINE) 10 MG tablet Take 1 tablet (10 mg total) by mouth every 6 (six) hours as needed (Nausea or vomiting). (Patient not taking: Reported on 12/05/2021) 30 tablet 1   SUNSCREEN SPF50 EX      valACYclovir (VALTREX) 1000 MG tablet Take 1,000 mg by mouth daily as needed (Fever Blister). (Patient not taking: Reported on 12/05/2021)     zolpidem (AMBIEN) 10 MG tablet Take 1 tablet (10 mg total) by mouth at bedtime as needed. for sleep 30 tablet 2   No current facility-administered medications for this visit.    SUMMARY OF ONCOLOGIC HISTORY: Oncology History Overview Note  Normal MMR HRD positive   Cancer of right fallopian tube ([place])  01/05/2021 Pathology Results   SURGICAL PATHOLOGY  CASE: [id number]  PATIENT: [facility]  Surgical Pathology Report   Reason for Addendum #1:  Immunohistochemistry results  Reason for Addendum #2:  DNA Mismatch Repair IHC Results   Clinical History: history of colon cancer, now with omental thickening  (cm)   FINAL MICROSCOPIC DIAGNOSIS:   A. OMENTUM, NEEDLE CORE BIOPSY:  - Metastatic poorly differentiated adenocarcinoma.  See comment   COMMENT:   Immunohistochemical stains show that the tumor cells are positive for CK7 and negative for CDX2 and CK20.  This immunoprofile is nonspecific and differential diagnosis can include an upper  gastrointestinal, breast and lung primary among other possibilities.  Only a very small fraction of primary colonic adenocarcinoma was immunoprofile.  Clinical and radiologic correlation suggested.   Additional immunohistochemical stains are performed and show that the tumor cells are positive for WT1, PAX8 and ER.  Immunostain for p53 shows a clonal overexpression pattern.  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  Clinical and radiologic correlation is suggested.    01/19/2021 Tumor Marker   Patient's tumor was tested for the following markers: CA-125. Results of the tumor marker test revealed 6790.   01/28/2021 PET scan   1. Examination is positive for extensive FDG avid peritoneal disease within the abdomen and pelvis including multiple implants upon the surface of liver and extensive omental caking. Moderate ascites noted within the pelvis. 2. FDG avid retroperitoneal, retrocrural, right internal mammary, and mediastinal lymph nodes compatible with metastatic adenopathy. 3. Small right pleural effusion. 4.  Aortic Atherosclerosis (ICD10-I70.0).     01/31/2021 Initial Diagnosis   Primary peritoneal carcinomatosis ([place])   01/31/2021 Cancer Staging   Staging form: Ovary, Fallopian Tube, and Primary Peritoneal Carcinoma,  AJCC 8th Edition - Clinical stage from 01/31/2021: FIGO Stage IV (cT2b, cN1b, cM1) - Signed by Heath Lark, MD on 01/31/2021 Stage prefix: Initial diagnosis   02/14/2021 Tumor Marker   Patient's tumor was tested for the following markers: CA-125. Results of the tumor marker test revealed 8668.   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 Remainder of testing was negative/normal. The report date is 02/09/2021.  The CancerNext-Expanded +  RNAinsight gene panel offered by [employer] and includes sequencing and rearrangement analysis for the following 77 genes: IP, ALK, APC*, ATM*, AXIN2, BAP1, BARD1, BLM, BMPR1A, BRCA1*, BRCA2*, BRIP1*, CDC73, CDH1*,CDK4, CDKN1B, CDKN2A, CHEK2*, CTNNA1, DICER1, FANCC, FH, FLCN, GALNT12, KIF1B, LZTR1, MAX, MEN1, MET, MLH1*, MSH2*, MSH3, MSH6*, MUTYH*, NBN, NF1*, NF2, NTHL1, PALB2*, PHOX2B, PMS2*, POT1, PRKAR1A, PTCH1, PTEN*, RAD51C*, RAD51D*,RB1, RECQL, RET, SDHA, SDHAF2, SDHB, SDHC, SDHD, SMAD4, SMARCA4, SMARCB1, SMARCE1, STK11, SUFU, TMEM127, TP53*,TSC1, TSC2, VHL and XRCC2 (sequencing and deletion/duplication); EGFR, EGLN1, HOXB13, KIT, MITF, PDGFRA, POLD1 and POLE (sequencing only); EPCAM and GREM1 (deletion/duplication only).   03/10/2021 Tumor Marker   Patient's tumor was tested for the following markers: CA-125. Results of the tumor marker test revealed 2002.   04/14/2021 Tumor Marker   Patient's tumor was tested for the following markers: CA-125. Results of the tumor marker test revealed 278.   04/26/2021 Imaging   Significant decrease in peritoneal carcinomatosis since previous study, with resolution of ascites.   Decreased anterior mediastinal and retrocrural lymphadenopathy.   No new or progressive metastatic disease within the chest, abdomen, or pelvis.   Aortic Atherosclerosis (ICD10-I70.0).   04/29/2021 Tumor Marker   Patient's tumor was tested for the following markers: CA-125. Results of the tumor marker test revealed 134.   05/12/2021 Surgery   Tumor debulking including robotic-assisted laparoscopic total hysterectomy with bilateral salpingoophorectomy, lysis of adhesions, excision and fulguration of peritoneal nodules, mini laparotomy for intra-abdominal palpation and omentectomy  Findings: : On exam under anesthesia, small mobile uterus.  On intra-abdominal entry, normal upper abdominal survey including liver and stomach.  Right diaphragm with small peritoneal studding, all  lesions less than 5 mm.  Omentum with an approximately 5 cm mass adherent to the lower anterior abdominal wall and bladder peritoneum.  Normal 6 cm uterus.  Somewhat atrophic appearing bilateral adnexa with tumor implants.  Sigmoid and rectal colon and mesentery with moderate peritoneal disease, all lesions less than 5 mm.  Multiple peritoneal implants on bilateral pelvic sidewalls and in the posterior cul-de-sac, all measuring less than 5 mm.  Multiple omental implants measuring several mm.  Approximately 1 cm area within the transverse colon mesentery, unclear if this is related to scar tissue from prior colon resection versus tumor implant.  No obvious pelvic adenopathy, no palpable para-aortic adenopathy. R1 resection at the end of surgery   05/12/2021 Pathology Results   A. UTERUS, CERVIX, BILATERAL FALLOPIAN TUBES AND OVARIES:  - Fallopian tubes:       - Bilateral fallopian tubes involved by high-grade serous  carcinoma.  - Ovaries:       - Bilateral ovaries involved by high-grade serous carcinoma.  - Uterine cervix:       - Benign transformation zone.       - Negative for squamous intraepithelial lesion and malignancy.  - Endometrium:       - Inactive endometrium.       -  Negative for atypical hyperplasia/EIN and malignancy.  - Myometrium:       - Negative for malignancy.  - Uterine serosa:       - Involved by metastatic high-grade serous carcinoma.   B. PERITONEAL IMPLANT, EXCISION:  - Metastatic high-grade serous carcinoma.   C. OMENTUM, RESECTION:  - Metastatic high-grade serous carcinoma, multifocal.   COMMENT:   A. Immunohistochemical studies show tumor cells to be positive for CK7,  Pax-8, and WT-1, and negative for CK20, PR, and CDX-2. These findings  support the above diagnosis. A p53 shows strong and diffuse staining  within neoplastic cells, indicating mutated p53 status.   OVARY or FALLOPIAN TUBE or PRIMARY PERITONEUM: Resection   Procedure: Total hysterectomy  and bilateral salpingo-oophorectomy  Specimen Integrity: Intact  Tumor Site: Favor right fallopian tube  Tumor Size: At least 1.7 cm  Histologic Type: High-grade serous carcinoma  Histologic Grade: High-grade  Ovarian Surface Involvement: Present, right and left  Fallopian Tube Surface Involvement: Present, right and left  Implants (required for advanced stage serous/seromucinous borderline  tumors only): Present: Peritoneal and omental  Other Tissue/ Organ Involvement: Bilateral ovaries, uterine serosa,  omentum, pelvic peritoneum  Largest Extrapelvic Peritoneal Focus: At least 6.7 cm  Peritoneal/Ascitic Fluid Involvement: Not submitted/unknown  Chemotherapy Response Score (CRS): CRS 1 (no definite or minimal  response)  Regional Lymph Nodes: Not applicable (no lymph nodes submitted or found)   Distant Metastasis:       Distant Site(s) Involved: Not applicable  Pathologic Stage Classification (pTNM, AJCC 8th Edition): ypT3c, pN not  assigned  TNM classifiers: y (posttreatment)  Ancillary Studies: Can be performed upon request  Representative Tumor Block: A4  Comment(s): None    06/30/2021 Tumor Marker   Patient's tumor was tested for the following markers: CA-125. Results of the tumor marker test revealed 72.8.   08/11/2021 Tumor Marker   Patient's tumor was tested for the following markers: Ca-125. Results of the tumor marker test revealed 30.7.   09/08/2021 Tumor Marker   Patient's tumor was tested for the following markers: CA-125. Results of the tumor marker test revealed 21.4.   09/09/2021 Imaging   1. No new or progressive metastatic disease in the chest abdomen or pelvis. 2. Continued interval decrease in omental caking now with minimal residual nodular stranding. 3. Similar to minimally decreased mediastinal adenopathy. 4. Aortic Atherosclerosis (ICD10-I70.0).   10/04/2021 Tumor Marker   Patient's tumor was tested for the following markers: CA-125. Results of the  tumor marker test revealed 22.2.   10/08/2021 -  Chemotherapy   She was started on lynparza   10/27/2021 Tumor Marker   Patient's tumor was tested for the following markers: CA-125. Results of the tumor marker test revealed 24.2.   12/06/2021 Tumor Marker   Patient's tumor was tested for the following markers: CA-125. Results of the tumor marker test revealed 25.1.   01/09/2022 Imaging   1. New 0.4 cm fissural nodule of the superior segment left lower lobe, nonspecific although modestly suspicious for pulmonary metastatic disease. Attention on follow-up. 2. No other evidence of new metastatic disease in the chest, abdomen, or pelvis. 3. Minimal residual omental and peritoneal stranding and nodularity, unchanged. 4. No significant change in enlarged right hilar, pretracheal, and right superior mediastinal lymph nodes. 5. Status post partial right hemicolectomy and reanastomosis. 6. Status post hysterectomy.   Aortic Atherosclerosis (ICD10-I70.0).   01/09/2022 Tumor Marker   Patient's tumor was tested for the following markers: CA-125. Results of the tumor marker test  revealed 29.2.     PHYSICAL EXAMINATION: ECOG PERFORMANCE STATUS: 1 - Symptomatic but completely ambulatory  Vitals:   01/09/22 0939  BP: 130/76  Pulse: 67  Resp: 16  Temp: 98.6 F (37 C)  SpO2: 98%   Filed Weights   01/09/22 0939  Weight: 156 lb 9.6 oz (71 kg)    GENERAL:alert, no distress and comfortable NEURO: alert & oriented x 3 with fluent speech, no focal motor/sensory deficits  LABORATORY DATA:  I have reviewed the data as listed    Component Value Date/Time   NA 139 01/06/2022 0824   K 3.9 01/06/2022 0824   CL 109 01/06/2022 0824   CO2 21 (L) 01/06/2022 0824   GLUCOSE 98 01/06/2022 0824   BUN 23 01/06/2022 0824   CREATININE 0.92 01/06/2022 0824   CREATININE 0.68 03/14/2016 1350   CALCIUM 9.7 01/06/2022 0824   PROT 7.2 01/06/2022 0824   ALBUMIN 4.0 01/06/2022 0824   AST 15 01/06/2022 0824    ALT 11 01/06/2022 0824   ALKPHOS 72 01/06/2022 0824   BILITOT 0.6 01/06/2022 0824   GFRNONAA >60 01/06/2022 0824   GFRAA >60 03/19/2016 0455    No results found for: "SPEP", "UPEP"  Lab Results  Component Value Date   WBC 4.3 01/06/2022   NEUTROABS 2.0 01/06/2022   HGB 12.5 01/06/2022   HCT 36.9 01/06/2022   MCV 101.4 (H) 01/06/2022   PLT 262 01/06/2022      Chemistry      Component Value Date/Time   NA 139 01/06/2022 0824   K 3.9 01/06/2022 0824   CL 109 01/06/2022 0824   CO2 21 (L) 01/06/2022 0824   BUN 23 01/06/2022 0824   CREATININE 0.92 01/06/2022 0824   CREATININE 0.68 03/14/2016 1350      Component Value Date/Time   CALCIUM 9.7 01/06/2022 0824   ALKPHOS 72 01/06/2022 0824   AST 15 01/06/2022 0824   ALT 11 01/06/2022 0824   BILITOT 0.6 01/06/2022 0824       RADIOGRAPHIC STUDIES: I have reviewed imaging studies with the patient and her sister I have personally reviewed the radiological images as listed and agreed with the findings in the report. CT CHEST ABDOMEN PELVIS W CONTRAST  Result Date: 01/06/2022 CLINICAL DATA:  Ovarian cancer, primary peritoneal carcinomatosis, assess treatment response, chemotherapy complete, additional history of colon cancer * Tracking Code: BO * EXAM: CT CHEST, ABDOMEN, AND PELVIS WITH CONTRAST TECHNIQUE: Multidetector CT imaging of the chest, abdomen and pelvis was performed following the standard protocol during bolus administration of intravenous contrast. RADIATION DOSE REDUCTION: This exam was performed according to the departmental dose-optimization program which includes automated exposure control, adjustment of the mA and/or kV according to patient size and/or use of iterative reconstruction technique. CONTRAST:  1107mL OMNIPAQUE IOHEXOL 300 MG/ML SOLN, additional oral enteric contrast COMPARISON:  CT chest abdomen pelvis, 09/08/2021 FINDINGS: CT CHEST FINDINGS Cardiovascular: Scattered aortic atherosclerosis. Normal heart size.  No pericardial effusion. Mediastinum/Nodes: No significant change in enlarged right hilar, pretracheal, and right superior mediastinal lymph nodes, largest pretracheal node measuring 1.3 x 1.2 cm (series 2, image 26). Thyroid gland, trachea, and esophagus demonstrate no significant findings. Lungs/Pleura: Unchanged mild biapical pleuroparenchymal scarring and dependent bibasilar scarring and or atelectasis. New 0.4 cm fissural nodule of the extreme superior segment left lower lobe (series 4, image 39). No pleural effusion or pneumothorax. Musculoskeletal: No chest wall mass or suspicious osseous lesions identified. CT ABDOMEN PELVIS FINDINGS Hepatobiliary: No solid liver abnormality is seen.  No gallstones, gallbladder wall thickening, or biliary dilatation. Pancreas: Unremarkable. No pancreatic ductal dilatation or surrounding inflammatory changes. Spleen: Normal in size without significant abnormality. Adrenals/Urinary Tract: Adrenal glands are unremarkable. Kidneys are normal, without renal calculi, solid lesion, or hydronephrosis. Bladder is unremarkable. Stomach/Bowel: Stomach is within normal limits. 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 Status post partial right hemicolectomy and reanastomosis. No evidence of bowel wall thickening, distention, or inflammatory changes. Vascular/Lymphatic: Aortic atherosclerosis. No enlarged abdominal or pelvic lymph nodes. Reproductive: Status post hysterectomy. Other: No abdominal wall hernia or abnormality. No ascites. Minimal residual omental and peritoneal stranding and nodularity (series 2, image 80 84). Musculoskeletal: No acute osseous findings. IMPRESSION: 1. New 0.4 cm fissural nodule of the superior segment left lower lobe, nonspecific although modestly suspicious for pulmonary metastatic disease. Attention on follow-up. 2. No other evidence of new metastatic disease in the chest, abdomen, or pelvis. 3. Minimal residual omental and peritoneal  stranding and nodularity, unchanged. 4. No significant change in enlarged right hilar, pretracheal, and right superior mediastinal lymph nodes. 5. Status post partial right hemicolectomy and reanastomosis. 6. Status post hysterectomy. Aortic Atherosclerosis (ICD10-I70.0). Electronically Signed   By: Delanna Ahmadi M.D.   On: 01/06/2022 20:16

## 2022-01-09 NOTE — Assessment & Plan Note (Signed)
She is receiving vitamin B12 injection I plan to recheck vitamin B12 level next month

## 2022-01-12 ENCOUNTER — Other Ambulatory Visit: Payer: Self-pay | Admitting: Gynecologic Oncology

## 2022-01-12 DIAGNOSIS — C482 Malignant neoplasm of peritoneum, unspecified: Secondary | ICD-10-CM

## 2022-01-24 ENCOUNTER — Ambulatory Visit: Payer: Medicare Other | Admitting: Sports Medicine

## 2022-01-25 DIAGNOSIS — E7800 Pure hypercholesterolemia, unspecified: Secondary | ICD-10-CM | POA: Diagnosis not present

## 2022-01-25 DIAGNOSIS — R5383 Other fatigue: Secondary | ICD-10-CM | POA: Diagnosis not present

## 2022-01-25 DIAGNOSIS — C189 Malignant neoplasm of colon, unspecified: Secondary | ICD-10-CM | POA: Diagnosis not present

## 2022-01-25 DIAGNOSIS — E039 Hypothyroidism, unspecified: Secondary | ICD-10-CM | POA: Diagnosis not present

## 2022-01-31 ENCOUNTER — Ambulatory Visit: Payer: Medicare Other | Admitting: Sports Medicine

## 2022-01-31 DIAGNOSIS — M810 Age-related osteoporosis without current pathological fracture: Secondary | ICD-10-CM | POA: Diagnosis not present

## 2022-01-31 DIAGNOSIS — E7800 Pure hypercholesterolemia, unspecified: Secondary | ICD-10-CM | POA: Diagnosis not present

## 2022-01-31 DIAGNOSIS — Z0000 Encounter for general adult medical examination without abnormal findings: Secondary | ICD-10-CM | POA: Diagnosis not present

## 2022-01-31 DIAGNOSIS — C5701 Malignant neoplasm of right fallopian tube: Secondary | ICD-10-CM | POA: Diagnosis not present

## 2022-01-31 DIAGNOSIS — I700 Atherosclerosis of aorta: Secondary | ICD-10-CM | POA: Diagnosis not present

## 2022-02-06 ENCOUNTER — Other Ambulatory Visit: Payer: Medicare Other

## 2022-02-06 ENCOUNTER — Ambulatory Visit: Payer: Medicare Other

## 2022-02-06 ENCOUNTER — Ambulatory Visit: Payer: Medicare Other | Admitting: Pharmacist

## 2022-02-09 ENCOUNTER — Other Ambulatory Visit: Payer: Self-pay

## 2022-02-09 ENCOUNTER — Inpatient Hospital Stay: Payer: Medicare Other

## 2022-02-09 ENCOUNTER — Inpatient Hospital Stay: Payer: Medicare Other | Admitting: Pharmacist

## 2022-02-09 ENCOUNTER — Inpatient Hospital Stay: Payer: Medicare Other | Attending: Genetic Counselor

## 2022-02-09 ENCOUNTER — Encounter: Payer: Self-pay | Admitting: Pharmacist

## 2022-02-09 VITALS — BP 141/88 | HR 76 | Temp 97.9°F | Resp 16 | Ht 65.0 in | Wt 159.4 lb

## 2022-02-09 DIAGNOSIS — C5701 Malignant neoplasm of right fallopian tube: Secondary | ICD-10-CM | POA: Insufficient documentation

## 2022-02-09 DIAGNOSIS — E538 Deficiency of other specified B group vitamins: Secondary | ICD-10-CM

## 2022-02-09 DIAGNOSIS — E559 Vitamin D deficiency, unspecified: Secondary | ICD-10-CM | POA: Insufficient documentation

## 2022-02-09 DIAGNOSIS — C482 Malignant neoplasm of peritoneum, unspecified: Secondary | ICD-10-CM

## 2022-02-09 LAB — CBC WITH DIFFERENTIAL (CANCER CENTER ONLY)
Abs Immature Granulocytes: 0.01 10*3/uL (ref 0.00–0.07)
Basophils Absolute: 0 10*3/uL (ref 0.0–0.1)
Basophils Relative: 1 %
Eosinophils Absolute: 0.1 10*3/uL (ref 0.0–0.5)
Eosinophils Relative: 1 %
HCT: 37.5 % (ref 36.0–46.0)
Hemoglobin: 12.7 g/dL (ref 12.0–15.0)
Immature Granulocytes: 0 %
Lymphocytes Relative: 40 %
Lymphs Abs: 1.5 10*3/uL (ref 0.7–4.0)
MCH: 34.6 pg — ABNORMAL HIGH (ref 26.0–34.0)
MCHC: 33.9 g/dL (ref 30.0–36.0)
MCV: 102.2 fL — ABNORMAL HIGH (ref 80.0–100.0)
Monocytes Absolute: 0.3 10*3/uL (ref 0.1–1.0)
Monocytes Relative: 7 %
Neutro Abs: 1.9 10*3/uL (ref 1.7–7.7)
Neutrophils Relative %: 51 %
Platelet Count: 269 10*3/uL (ref 150–400)
RBC: 3.67 MIL/uL — ABNORMAL LOW (ref 3.87–5.11)
RDW: 14.5 % (ref 11.5–15.5)
WBC Count: 3.7 10*3/uL — ABNORMAL LOW (ref 4.0–10.5)
nRBC: 0 % (ref 0.0–0.2)

## 2022-02-09 LAB — CMP (CANCER CENTER ONLY)
ALT: 13 U/L (ref 0–44)
AST: 18 U/L (ref 15–41)
Albumin: 4.4 g/dL (ref 3.5–5.0)
Alkaline Phosphatase: 80 U/L (ref 38–126)
Anion gap: 9 (ref 5–15)
BUN: 19 mg/dL (ref 8–23)
CO2: 25 mmol/L (ref 22–32)
Calcium: 10.1 mg/dL (ref 8.9–10.3)
Chloride: 107 mmol/L (ref 98–111)
Creatinine: 0.86 mg/dL (ref 0.44–1.00)
GFR, Estimated: >60 mL/min (ref >60)
Glucose, Bld: 104 mg/dL — ABNORMAL HIGH (ref 70–99)
Potassium: 4 mmol/L (ref 3.5–5.1)
Sodium: 141 mmol/L (ref 135–145)
Total Bilirubin: 1 mg/dL (ref 0.3–1.2)
Total Protein: 7.8 g/dL (ref 6.5–8.1)

## 2022-02-09 LAB — VITAMIN D 25 HYDROXY (VIT D DEFICIENCY, FRACTURES): Vit D, 25-Hydroxy: 56.02 ng/mL (ref 30–100)

## 2022-02-09 LAB — VITAMIN B12: Vitamin B-12: 203 pg/mL (ref 180–914)

## 2022-02-09 MED ORDER — IBUPROFEN 800 MG PO TABS: 800.0000 mg | ORAL_TABLET | Freq: Three times a day (TID) | ORAL | 0 refills | Status: DC | PRN

## 2022-02-09 MED ORDER — CYANOCOBALAMIN 1000 MCG/ML IJ SOLN
1000.0000 ug | Freq: Once | INTRAMUSCULAR | Status: AC
  Administered 2022-02-09: 1000 ug via INTRAMUSCULAR
  Filled 2022-02-09: qty 1

## 2022-02-09 NOTE — Progress Notes (Signed)
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 Heath Lark.    Current treatment regimen and start date Olaparib (10/08/21)   Interval History She continues on olaparib 100 mg by mouth every 12 hours on days 1 to 28 of a 28-day cycle. This is being given  monotherapy. Of note, she also receives a monthly B-12 injection that was started on 10/17/21 by Dr. Alvy Bimler. Therapy is planned to continue until disease progression or unacceptable toxicity.   Response to Therapy Leslie Rivera was seen today by clinical pharmacy as a follow-up to her olaparib management.  She was last seen by Dr. Alvy Bimler on 01/09/22 and clinical pharmacy on 12/05/21.  At her last visit with Dr. Alvy Bimler, her restaging scans were reviewed and showed stable disease.  Dr. Alvy Bimler recommended restaging scans every 6 months.  Dr. Alvy Bimler would also recommended a vitamin D level which is not back yet today.  Overall, Leslie Rivera is doing well.  She is not really experiencing any diarrhea and has had minimal episodes of nausea which she continues to take Dramamine plus ginger chews for.  This has been added to her medication list.  She states that she is also increased her vitamin D dose from 2000 IUs to 5000 daily.  She takes a topical Arnica cream which she states has been helping with her joint pain and right shin pain.  Today we also sent a prescription for ibuprofen 800 mg tablet every 8 hours as needed which she states has been used in the past and worked well.  We did state that this should be used on an as-needed basis as she does have some risk of additional GI toxicity with escitalopram also being used.  Another option would be to use diclofenac  topically which she may consider, but should be used sparingly if combining with systemic ibuprofen due to potential GI toxicity risk. However, using them both as needed should reduce this risk.  We also went over the potential serious side effects and warning signs of olaparib again today which include but are not limited to pneumonitis and blood clots.  She will receive her monthly B12 injection today and again in 4 weeks.  We will add this injection on the next day that she sees Dr. Alvy Bimler with labs which is 03/14/22 and then she will see clinical pharmacy again tentatively in 8 weeks.  Labs, vitals, treatment parameters, and manufacturer guidelines assessing toxicity were reviewed with Shena L Hypes today. Based on these values, patient is in agreement to continue olaparib therapy at this time.  Allergies Allergies  Allergen Reactions   Barium-Containing Compounds     Legs and arm cramping & caused fall   Hydrocodone Nausea And Vomiting    Can tolerate Oxycodone   Erythromycin     GI upset   Zofran [Ondansetron]     Other reaction(s): Headache-Never wants  again    Vitals    02/09/2022   10:35 AM 01/09/2022    9:39 AM 12/13/2021   10:37 AM  Vitals with BMI  Height '5\' 5"'$  '5\' 5"'$  5' 5.5"  Weight 159 lbs 6 oz 156 lbs 10 oz 155 lbs  BMI 26.53 26.94 85.46  Systolic 270 350 093  Diastolic 88 76 82  Pulse 76 67      Laboratory Data    Latest Ref Rng & Units 02/09/2022    9:52 AM 01/06/2022    8:24 AM 12/05/2021    8:29 AM  CBC EXTENDED  WBC 4.0 - 10.5 K/uL 3.7  4.3  3.3   RBC 3.87 - 5.11 MIL/uL 3.67  3.64  3.62   Hemoglobin 12.0 - 15.0 g/dL 12.7  12.5  12.5   HCT 36.0 - 46.0 % 37.5  36.9  36.8   Platelets 150 - 400 K/uL 269  262  250   NEUT# 1.7 - 7.7 K/uL 1.9  2.0  1.6   Lymph# 0.7 - 4.0 K/uL 1.5  1.8  1.3        Latest Ref Rng & Units 02/09/2022    9:52 AM 01/06/2022    8:24 AM 12/05/2021    8:29 AM  CMP  Glucose 70 - 99 mg/dL 104  98  105   BUN 8 - 23 mg/dL '19  23  21    '$ Creatinine 0.44 - 1.00 mg/dL 0.86  0.92  1.15   Sodium 135 - 145 mmol/L 141  139  139   Potassium 3.5 - 5.1 mmol/L 4.0  3.9  3.8   Chloride 98 - 111 mmol/L 107  109  107   CO2 22 - 32 mmol/L '25  21  23   '$ Calcium 8.9 - 10.3 mg/dL 10.1  9.7  9.8   Total Protein 6.5 - 8.1 g/dL 7.8  7.2  7.1   Total Bilirubin 0.3 - 1.2 mg/dL 1.0  0.6  0.4   Alkaline Phos 38 - 126 U/L 80  72  66   AST 15 - 41 U/L '18  15  16   '$ ALT 0 - 44 U/L '13  11  11     '$ No results found for: "MG"   Adverse Effects Assessment Nausea: not using antiemetics. Dramamine + ginger chews work well for her as needed.  Joint pain and right shin pain: using Arnica cream with some relief. Not interested in taking oxycodone/acetaminophen at recommended dose because makes her "loopy". She prefers to keep on medication list for now in case she needs it. Did want prescription for higher dose of ibuprofen which was sent to her local pharmacy of choice. She has used this in the past and it worked well.  We went over as needed instructions and that it could increase risk of GI bleed with escitalopram.   Adherence Assessment Trinda L Grosz reports missing 2 doses over the past 4 weeks.   Reason for missed dose: forgot, overall does very well with adherence. Patient was re-educated on importance of adherence.   Access Assessment Adaiah L Walts is currently receiving her olaparib through  [facility] concerns:  none  Medication Reconciliation [name] medication list was reviewed today with the patient? Yes New medications or herbal supplements have recently been started? Yes , added dramamine+ginger chews and Arnica cream. Also added ibuprofen 800 mg every 8 hr PRN. Any medications have been discontinued? No  The medication list was updated and reconciled based on the patient's most recent medication list in the electronic medical record (EMR) including herbal products and OTC medications.   Medications Current  Outpatient Medications  Medication Sig Dispense Refill   Calcium Carb-Cholecalciferol (CALCIUM 600+D3 PO) Take 1 tablet by mouth in the morning and at bedtime.     cetirizine (ZYRTEC) 10 MG  tablet Take 10 mg by mouth every 30 (thirty) days. As needed     cholecalciferol (VITAMIN D3) 25 MCG (1000 UNIT) tablet Take 5,000 Units by mouth in the morning.     escitalopram (LEXAPRO) 10 MG tablet TAKE 1 & 1/2 (ONE & ONE-HALF) TABLETS BY MOUTH ONCE DAILY 45 tablet 0   Ginger-Calcium Carbonate (DRAMAMINE GINGER CHEWS PO) Take by mouth.     Homeopathic Products (T-RELIEF ARNICA + 12) CREA Apply topically.     ibuprofen (ADVIL) 800 MG tablet Take 1 tablet (800 mg total) by mouth every 8 (eight) hours as needed for moderate pain or mild pain. 30 tablet 0   levothyroxine (SYNTHROID, LEVOTHROID) 75 MCG tablet Take 75 mcg by mouth daily before breakfast.  3   olaparib (LYNPARZA) 100 MG tablet Take 1 tablet (100 mg total) by mouth 2 (two) times daily. Swallow whole. May take with food to decrease nausea and vomiting. 60 tablet 11   Polyethyl Glycol-Propyl Glycol (SYSTANE) 0.4-0.3 % SOLN Place 1-2 drops into both eyes 3 (three) times daily as needed (dry/irritated eyes.).     SUNSCREEN SPF50 EX      zolpidem (AMBIEN) 10 MG tablet Take 1 tablet (10 mg total) by mouth at bedtime as needed. for sleep 30 tablet 2   clobetasol cream (TEMOVATE) 6.14 % 1 application to affected area (Patient not taking: Reported on 02/09/2022)     diazepam (VALIUM) 5 MG tablet Take 5 mg by mouth daily as needed for anxiety. (Patient not taking: Reported on 12/05/2021)     ondansetron (ZOFRAN) 8 MG tablet Take 1 tablet (8 mg total) by mouth every 8 (eight) hours as needed for refractory nausea / vomiting. (Patient not taking: Reported on 12/05/2021) 30 tablet 1   oxyCODONE-acetaminophen (PERCOCET/ROXICET) 5-325 MG tablet Take 1 tablet by mouth every 6 (six) hours as needed for severe pain. (Patient not taking: Reported on 02/09/2022) 30 tablet 0    prochlorperazine (COMPAZINE) 10 MG tablet Take 1 tablet (10 mg total) by mouth every 6 (six) hours as needed (Nausea or vomiting). (Patient not taking: Reported on 12/05/2021) 30 tablet 1   valACYclovir (VALTREX) 1000 MG tablet Take 1,000 mg by mouth daily as needed (Fever Blister). (Patient not taking: Reported on 12/05/2021)     No current facility-administered medications for this visit.   Facility-Administered Medications Ordered in Other Visits  Medication Dose Route Frequency Provider Last Rate Last Admin   cyanocobalamin (VITAMIN B12) injection 1,000 mcg  1,000 mcg Intramuscular Once Heath Lark, MD        Drug-Drug Interactions (DDIs) DDIs were evaluated? Yes Significant DDIs? Yes , as above discussed potential increased GI side effects with escitalopram and ibuprofen. The patient was instructed to speak with their health care provider and/or the oral chemotherapy pharmacist before starting any new drug, including prescription or over the counter, natural / herbal products, or vitamins.  Supportive Care Nausea DVTs Pneumonitis  Dosing Assessment Hepatic adjustments needed? No  Renal adjustments needed? No  Toxicity adjustments needed? No  The current dosing regimen is appropriate to continue at this time.  Follow-Up Plan Continue olaparib 100 mg by mouth every 12 hours Continue monthly B12 injections. Will receive today Prescription sent per patient request for ibuprofen 800 mg every 8 hours as needed for joint pain. Stop OTC ibuprofen She will continue Arnica cream for joint pain and may consider OTC diclofenac topically if pain continues to be severe. Ms. Rowlands is aware of potential increased GI toxicities with NSAIDs  and escitalopram. Kidney function is normal today Continue dramamine+ginger chews as needed for nausea and use antiemetics prescribed as needed Lab, Dr. Alvy Bimler visit, and B12 on 03/14/22. Dr. Alvy Bimler also following Vitamin D which was not resulted yet today.  Ms. Caris did increased dose of VitD to 5000 IU after speaking to Dr. Alvy Bimler last month Lab, pharmacy clinic visit, B12 on 04/11/22  Diamonds L Miano participated in the discussion, expressed understanding, and voiced agreement with the above plan. All questions were answered to her satisfaction. The patient was advised to contact the clinic at [phone] with any questions or concerns prior to her return visit.   I spent 60 minutes assessing and educating the patient.  Raina Mina, RPH-CPP, 02/09/2022  11:26 AM   **Disclaimer: This note was dictated with voice recognition software. Similar sounding words can inadvertently be transcribed and this note may contain transcription errors which may not have been corrected upon publication of note.**

## 2022-02-09 NOTE — Patient Instructions (Signed)
Vitamin B12 Deficiency Vitamin B12 deficiency means that your body does not have enough vitamin B12. The body needs this important vitamin: To make red blood cells. To make genes (DNA). To help the nerves work. If you do not have enough vitamin B12 in your body, you can have health problems, such as not having enough red blood cells in the blood (anemia). What are the causes? Not eating enough foods that contain vitamin B12. Not being able to take in (absorb) vitamin B12 from the food that you eat. Certain diseases. A condition in which the body does not make enough of a certain protein. This results in your body not taking in enough vitamin B12. Having a surgery in which part of the stomach or small intestine is taken out. Taking medicines that make it hard for the body to take in vitamin B12. These include: Heartburn medicines. Some medicines that are used to treat diabetes. What increases the risk? Being an older adult. Eating a vegetarian or vegan diet that does not include any foods that come from animals. Not eating enough foods that contain vitamin B12 while you are pregnant. Taking certain medicines. Having alcoholism. What are the signs or symptoms? In some cases, there are no symptoms. If the condition leads to too few blood cells or nerve damage, symptoms can occur, such as: Feeling weak or tired. Not being hungry. Losing feeling (numbness) or tingling in your hands and feet. Redness and burning of the tongue. Feeling sad (depressed). Confusion or memory problems. Trouble walking. If anemia is very bad, symptoms can include: Being short of breath. Being dizzy. Having a very fast heartbeat. How is this treated? Changing the way you eat and drink, such as: Eating more foods that contain vitamin B12. Drinking little or no alcohol. Getting vitamin B12 shots. Taking vitamin B12 supplements by mouth (orally). Your doctor will tell you the dose that is best for you. Follow  these instructions at home: Eating and drinking  Eat foods that come from animals and have a lot of vitamin B12 in them. These include: Meats and poultry. This includes beef, pork, chicken, turkey, and organ meats, such as liver. Seafood, such as clams, rainbow trout, salmon, tuna, and haddock. Eggs. Dairy foods such as milk, yogurt, and cheese. Eat breakfast cereals that have vitamin B12 added to them (are fortified). Check the label. The items listed above may not be a complete list of foods and beverages you can eat and drink. Contact a dietitian for more information. Alcohol use Do not drink alcohol if: Your doctor tells you not to drink. You are pregnant, may be pregnant, or are planning to become pregnant. If you drink alcohol: Limit how much you have to: 0-1 drink a day for women. 0-2 drinks a day for men. Know how much alcohol is in your drink. In the [place] one drink equals one 12 oz bottle of beer (355 mL), one 5 oz glass of wine (148 mL), or one 1 oz glass of hard liquor (44 mL). General instructions Get any vitamin B12 shots if told by your doctor. Take supplements only as told by your doctor. Follow the directions. Keep all follow-up visits. Contact a doctor if: Your symptoms come back. Your symptoms get worse or do not get better with treatment. Get help right away if: You have trouble breathing. You have a very fast heartbeat. You have chest pain. You get dizzy. You faint. These symptoms may be an emergency. Get help right away. Call 911.   Do not wait to see if the symptoms will go away. Do not drive yourself to the hospital. Summary Vitamin B12 deficiency means that your body is not getting enough of the vitamin. In some cases, there are no symptoms of this condition. Treatment may include making a change in the way you eat and drink, getting shots, or taking supplements. Eat foods that have vitamin B12 in them. This information is not intended to replace advice  given to you by your health care provider. Make sure you discuss any questions you have with your health care provider. Document Revised: 02/04/2021 Document Reviewed: 02/04/2021 Elsevier Patient Education  2023 [employer]  

## 2022-02-11 LAB — CA 125: Cancer Antigen (CA) 125: 39.2 U/mL — ABNORMAL HIGH (ref 0.0–38.1)

## 2022-02-13 ENCOUNTER — Telehealth: Payer: Self-pay

## 2022-02-13 NOTE — Telephone Encounter (Signed)
-----   Message from Heath Lark, MD sent at 02/13/2022  8:33 AM EDT ----- Her CA-125 came back a bit elevated I recommend her to continue treatment and see me as scheduled but move her labs to be done the Friday before her appt

## 2022-02-13 NOTE — Telephone Encounter (Signed)
She called back and given below message. She verbalized understanding. Lab appt moved to 9/15 at 9am. She is aware of appt.

## 2022-02-13 NOTE — Telephone Encounter (Signed)
Called and left a message asking her to call the office back. 

## 2022-03-10 ENCOUNTER — Other Ambulatory Visit: Payer: Self-pay

## 2022-03-10 ENCOUNTER — Inpatient Hospital Stay: Payer: Medicare Other | Attending: Genetic Counselor

## 2022-03-10 DIAGNOSIS — R911 Solitary pulmonary nodule: Secondary | ICD-10-CM | POA: Insufficient documentation

## 2022-03-10 DIAGNOSIS — I700 Atherosclerosis of aorta: Secondary | ICD-10-CM | POA: Diagnosis not present

## 2022-03-10 DIAGNOSIS — Z79890 Hormone replacement therapy (postmenopausal): Secondary | ICD-10-CM | POA: Insufficient documentation

## 2022-03-10 DIAGNOSIS — Z79899 Other long term (current) drug therapy: Secondary | ICD-10-CM | POA: Diagnosis not present

## 2022-03-10 DIAGNOSIS — E538 Deficiency of other specified B group vitamins: Secondary | ICD-10-CM | POA: Insufficient documentation

## 2022-03-10 DIAGNOSIS — C786 Secondary malignant neoplasm of retroperitoneum and peritoneum: Secondary | ICD-10-CM | POA: Diagnosis not present

## 2022-03-10 DIAGNOSIS — C5701 Malignant neoplasm of right fallopian tube: Secondary | ICD-10-CM | POA: Insufficient documentation

## 2022-03-10 DIAGNOSIS — C482 Malignant neoplasm of peritoneum, unspecified: Secondary | ICD-10-CM

## 2022-03-10 LAB — CMP (CANCER CENTER ONLY)
ALT: 17 U/L (ref 0–44)
AST: 21 U/L (ref 15–41)
Albumin: 4 g/dL (ref 3.5–5.0)
Alkaline Phosphatase: 69 U/L (ref 38–126)
Anion gap: 8 (ref 5–15)
BUN: 22 mg/dL (ref 8–23)
CO2: 24 mmol/L (ref 22–32)
Calcium: 9.6 mg/dL (ref 8.9–10.3)
Chloride: 107 mmol/L (ref 98–111)
Creatinine: 0.93 mg/dL (ref 0.44–1.00)
GFR, Estimated: >60 mL/min (ref >60)
Glucose, Bld: 91 mg/dL (ref 70–99)
Potassium: 3.8 mmol/L (ref 3.5–5.1)
Sodium: 139 mmol/L (ref 135–145)
Total Bilirubin: 0.7 mg/dL (ref 0.3–1.2)
Total Protein: 7.3 g/dL (ref 6.5–8.1)

## 2022-03-10 LAB — CBC WITH DIFFERENTIAL (CANCER CENTER ONLY)
Abs Immature Granulocytes: 0.02 10*3/uL (ref 0.00–0.07)
Basophils Absolute: 0 10*3/uL (ref 0.0–0.1)
Basophils Relative: 1 %
Eosinophils Absolute: 0.1 10*3/uL (ref 0.0–0.5)
Eosinophils Relative: 2 %
HCT: 38.2 % (ref 36.0–46.0)
Hemoglobin: 12.8 g/dL (ref 12.0–15.0)
Immature Granulocytes: 1 %
Lymphocytes Relative: 44 %
Lymphs Abs: 1.5 10*3/uL (ref 0.7–4.0)
MCH: 34.7 pg — ABNORMAL HIGH (ref 26.0–34.0)
MCHC: 33.5 g/dL (ref 30.0–36.0)
MCV: 103.5 fL — ABNORMAL HIGH (ref 80.0–100.0)
Monocytes Absolute: 0.4 10*3/uL (ref 0.1–1.0)
Monocytes Relative: 12 %
Neutro Abs: 1.3 10*3/uL — ABNORMAL LOW (ref 1.7–7.7)
Neutrophils Relative %: 40 %
Platelet Count: 258 10*3/uL (ref 150–400)
RBC: 3.69 MIL/uL — ABNORMAL LOW (ref 3.87–5.11)
RDW: 14 % (ref 11.5–15.5)
WBC Count: 3.3 10*3/uL — ABNORMAL LOW (ref 4.0–10.5)
nRBC: 0 % (ref 0.0–0.2)

## 2022-03-13 LAB — CA 125: Cancer Antigen (CA) 125: 56.6 U/mL — ABNORMAL HIGH (ref 0.0–38.1)

## 2022-03-14 ENCOUNTER — Other Ambulatory Visit: Payer: Self-pay

## 2022-03-14 ENCOUNTER — Encounter: Payer: Self-pay | Admitting: Hematology and Oncology

## 2022-03-14 ENCOUNTER — Other Ambulatory Visit: Payer: Medicare Other

## 2022-03-14 ENCOUNTER — Inpatient Hospital Stay: Payer: Medicare Other

## 2022-03-14 ENCOUNTER — Inpatient Hospital Stay: Payer: Medicare Other | Admitting: Hematology and Oncology

## 2022-03-14 VITALS — BP 142/88 | HR 60 | Temp 97.8°F | Resp 18 | Ht 65.0 in | Wt 161.6 lb

## 2022-03-14 DIAGNOSIS — C5701 Malignant neoplasm of right fallopian tube: Secondary | ICD-10-CM | POA: Diagnosis not present

## 2022-03-14 DIAGNOSIS — R911 Solitary pulmonary nodule: Secondary | ICD-10-CM | POA: Diagnosis not present

## 2022-03-14 DIAGNOSIS — Z79890 Hormone replacement therapy (postmenopausal): Secondary | ICD-10-CM | POA: Diagnosis not present

## 2022-03-14 DIAGNOSIS — Z7189 Other specified counseling: Secondary | ICD-10-CM | POA: Diagnosis not present

## 2022-03-14 DIAGNOSIS — E538 Deficiency of other specified B group vitamins: Secondary | ICD-10-CM

## 2022-03-14 DIAGNOSIS — I700 Atherosclerosis of aorta: Secondary | ICD-10-CM | POA: Diagnosis not present

## 2022-03-14 DIAGNOSIS — Z79899 Other long term (current) drug therapy: Secondary | ICD-10-CM | POA: Diagnosis not present

## 2022-03-14 DIAGNOSIS — C786 Secondary malignant neoplasm of retroperitoneum and peritoneum: Secondary | ICD-10-CM | POA: Diagnosis not present

## 2022-03-14 MED ORDER — OXYCODONE-ACETAMINOPHEN 5-325 MG PO TABS: 1.0000 | ORAL_TABLET | Freq: Four times a day (QID) | ORAL | 0 refills | Status: DC | PRN

## 2022-03-14 MED ORDER — VALACYCLOVIR HCL 1 G PO TABS: 1000.0000 mg | ORAL_TABLET | Freq: Every day | ORAL | Status: DC | PRN

## 2022-03-14 MED ORDER — CYANOCOBALAMIN 1000 MCG/ML IJ SOLN
1000.0000 ug | Freq: Once | INTRAMUSCULAR | Status: AC
  Administered 2022-03-14: 1000 ug via INTRAMUSCULAR
  Filled 2022-03-14: qty 1

## 2022-03-14 MED ORDER — ZOLPIDEM TARTRATE 10 MG PO TABS: 10.0000 mg | ORAL_TABLET | Freq: Every evening | ORAL | 2 refills | Status: DC | PRN

## 2022-03-14 MED ORDER — DIAZEPAM 5 MG PO TABS: 5.0000 mg | ORAL_TABLET | Freq: Every day | ORAL | Status: DC | PRN

## 2022-03-14 NOTE — Progress Notes (Signed)
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[facility]) I reviewed recent test result with the patient and her sister Her tumor marker is rising I am concerned about early signs of cancer relapse We discussed extensively about plan of care if CT imaging show evidence of disease relapse Currently, she is not symptomatic She appears to be interested to entertain the idea of reduced dose single agent carboplatin in the future I plan to order CT imaging to be done next week and see her back to discuss treatment options  Vitamin B12 deficiency She will continue vitamin B12 injection monthly for now  Goals of care, counseling/discussion We have numerous goals of care discussions in the past She is interested to pursue further treatment She will continue taking olaparib for now until I see her back next week for follow-up  Orders Placed This Encounter  Procedures   CT CHEST ABDOMEN PELVIS W CONTRAST    Standing Status:   Future    Standing Expiration Date:   03/15/2023    Scheduling Instructions:     No oral contrast    Order Specific Question:   Preferred imaging location?    Answer:   [facility]    Order Specific Question:   Radiology Contrast Protocol - do NOT remove file path    Answer:   [email or web address]    All questions were answered. The patient knows to call the clinic with any problems, questions or concerns. The total time spent in the appointment was 40 minutes encounter with patients including review of chart and various tests results, discussions about plan of care and coordination of care plan   Heath Lark, MD 03/14/2022 12:52 PM  INTERVAL HISTORY: Please see  below for problem oriented charting. she returns for treatment follow-up with her sister She is on maintenance olaparib She has good quality of life Denies abdominal pain, bloating or changes in bowel habits We spent majority of our time discussing test results and next plan of care  REVIEW OF SYSTEMS:   Constitutional: Denies fevers, chills or abnormal weight loss All other systems were reviewed with the patient and are negative.  I have reviewed the past medical history, past surgical history, social history and family history with the patient and they are unchanged from previous note.  ALLERGIES:  is allergic to barium-containing compounds, hydrocodone, erythromycin, and zofran [ondansetron].  MEDICATIONS:  Current Outpatient Medications  Medication Sig Dispense Refill   Calcium Carb-Cholecalciferol (CALCIUM 600+D3 PO) Take 1 tablet by mouth in the morning and at bedtime.     cetirizine (ZYRTEC) 10 MG tablet Take 10 mg by mouth every 30 (thirty) days. As needed     cholecalciferol (VITAMIN D3) 25 MCG (1000 UNIT) tablet Take 5,000 Units by mouth in the morning.     diazepam (VALIUM) 5 MG tablet Take 1 tablet (5 mg total) by mouth daily as needed for anxiety. 30 tablet    escitalopram (LEXAPRO) 10 MG tablet TAKE 1 & 1/2 (ONE & ONE-HALF) TABLETS BY MOUTH ONCE DAILY 45 tablet 0   Ginger-Calcium Carbonate (DRAMAMINE GINGER CHEWS PO) Take by mouth.     Homeopathic Products (T-RELIEF ARNICA + 12) CREA Apply topically.     levothyroxine (SYNTHROID, LEVOTHROID) 75 MCG tablet Take 75 mcg by mouth daily before  breakfast.  3   olaparib (LYNPARZA) 100 MG tablet Take 1 tablet (100 mg total) by mouth 2 (two) times daily. Swallow whole. May take with food to decrease nausea and vomiting. 60 tablet 11   ondansetron (ZOFRAN) 8 MG tablet Take 1 tablet (8 mg total) by mouth every 8 (eight) hours as needed for refractory nausea / vomiting. (Patient not taking: Reported on 12/05/2021) 30 tablet 1    oxyCODONE-acetaminophen (PERCOCET/ROXICET) 5-325 MG tablet Take 1 tablet by mouth every 6 (six) hours as needed for severe pain. 30 tablet 0   Polyethyl Glycol-Propyl Glycol (SYSTANE) 0.4-0.3 % SOLN Place 1-2 drops into both eyes 3 (three) times daily as needed (dry/irritated eyes.).     prochlorperazine (COMPAZINE) 10 MG tablet Take 1 tablet (10 mg total) by mouth every 6 (six) hours as needed (Nausea or vomiting). (Patient not taking: Reported on 12/05/2021) 30 tablet 1   SUNSCREEN SPF50 EX      valACYclovir (VALTREX) 1000 MG tablet Take 1 tablet (1,000 mg total) by mouth daily as needed (Fever Blister).     zolpidem (AMBIEN) 10 MG tablet Take 1 tablet (10 mg total) by mouth at bedtime as needed. for sleep 30 tablet 2   No current facility-administered medications for this visit.    SUMMARY OF ONCOLOGIC HISTORY: Oncology History Overview Note  Normal MMR HRD positive   Cancer of right fallopian tube (HCC)  01/05/2021 Pathology Results   SURGICAL PATHOLOGY  CASE: [id number]  PATIENT: Leslie Rivera  Surgical Pathology Report   Reason for Addendum #1:  Immunohistochemistry results  Reason for Addendum #2:  DNA Mismatch Repair IHC Results   Clinical History: history of colon cancer, now with omental thickening  (cm)   FINAL MICROSCOPIC DIAGNOSIS:   A. OMENTUM, NEEDLE CORE BIOPSY:  - Metastatic poorly differentiated adenocarcinoma.  See comment   COMMENT:   Immunohistochemical stains show that the tumor cells are positive for CK7 and negative for CDX2 and CK20.  This immunoprofile is nonspecific and differential diagnosis can include an upper gastrointestinal, breast and lung primary among other possibilities.  Only a very small fraction of primary colonic adenocarcinoma was immunoprofile.  Clinical and radiologic correlation suggested.   Additional immunohistochemical stains are performed and show that the tumor cells are positive for WT1, PAX8 and ER.  Immunostain for p53 shows  a clonal overexpression pattern.  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  Clinical and radiologic correlation is suggested.    01/19/2021 Tumor Marker   Patient's tumor was tested for the following markers: CA-125. Results of the tumor marker test revealed 6790.   01/28/2021 PET scan   1. Examination is positive for extensive FDG avid peritoneal disease within the abdomen and pelvis including multiple implants upon the surface of liver and extensive omental caking. Moderate ascites noted within the pelvis. 2. FDG avid retroperitoneal, retrocrural, right internal mammary, and mediastinal lymph nodes compatible with metastatic adenopathy. 3. Small right pleural effusion. 4.  Aortic Atherosclerosis (ICD10-I70.0).     01/31/2021 Initial Diagnosis   Primary peritoneal carcinomatosis (HCC)   01/31/2021 Cancer Staging   Staging form: Ovary, Fallopian Tube, and Primary Peritoneal Carcinoma, AJCC 8th Edition - Clinical stage from 01/31/2021: FIGO Stage IV (cT2b, cN1b, cM1) - Signed by Artis Delay, MD on 01/31/2021 Stage prefix: Initial diagnosis   02/14/2021 Tumor Marker   Patient's tumor was tested for the following markers: CA-125. Results of the tumor marker test revealed 8668.  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 Remainder of testing was negative/normal. The report date is 02/09/2021.  The CancerNext-Expanded + RNAinsight gene panel offered by [employer] and includes sequencing and rearrangement analysis for the following 77 genes: IP, ALK, APC*, ATM*, AXIN2, BAP1, BARD1, BLM, BMPR1A, BRCA1*, BRCA2*, BRIP1*, CDC73, CDH1*,CDK4, CDKN1B, CDKN2A, CHEK2*, CTNNA1, DICER1, FANCC, FH, FLCN, GALNT12, KIF1B, LZTR1, MAX, MEN1, MET, MLH1*, MSH2*, MSH3, MSH6*,  MUTYH*, NBN, NF1*, NF2, NTHL1, PALB2*, PHOX2B, PMS2*, POT1, PRKAR1A, PTCH1, PTEN*, RAD51C*, RAD51D*,RB1, RECQL, RET, SDHA, SDHAF2, SDHB, SDHC, SDHD, SMAD4, SMARCA4, SMARCB1, SMARCE1, STK11, SUFU, TMEM127, TP53*,TSC1, TSC2, VHL and XRCC2 (sequencing and deletion/duplication); EGFR, EGLN1, HOXB13, KIT, MITF, PDGFRA, POLD1 and POLE (sequencing only); EPCAM and GREM1 (deletion/duplication only).   03/10/2021 Tumor Marker   Patient's tumor was tested for the following markers: CA-125. Results of the tumor marker test revealed 2002.   04/14/2021 Tumor Marker   Patient's tumor was tested for the following markers: CA-125. Results of the tumor marker test revealed 278.   04/26/2021 Imaging   Significant decrease in peritoneal carcinomatosis since previous study, with resolution of ascites.   Decreased anterior mediastinal and retrocrural lymphadenopathy.   No new or progressive metastatic disease within the chest, abdomen, or pelvis.   Aortic Atherosclerosis (ICD10-I70.0).   04/29/2021 Tumor Marker   Patient's tumor was tested for the following markers: CA-125. Results of the tumor marker test revealed 134.   05/12/2021 Surgery   Tumor debulking including robotic-assisted laparoscopic total hysterectomy with bilateral salpingoophorectomy, lysis of adhesions, excision and fulguration of peritoneal nodules, mini laparotomy for intra-abdominal palpation and omentectomy  Findings: : On exam under anesthesia, small mobile uterus.  On intra-abdominal entry, normal upper abdominal survey including liver and stomach.  Right diaphragm with small peritoneal studding, all lesions less than 5 mm.  Omentum with an approximately 5 cm mass adherent to the lower anterior abdominal wall and bladder peritoneum.  Normal 6 cm uterus.  Somewhat atrophic appearing bilateral adnexa with tumor implants.  Sigmoid and rectal colon and mesentery with moderate peritoneal disease, all lesions less than 5 mm.  Multiple peritoneal  implants on bilateral pelvic sidewalls and in the posterior cul-de-sac, all measuring less than 5 mm.  Multiple omental implants measuring several mm.  Approximately 1 cm area within the transverse colon mesentery, unclear if this is related to scar tissue from prior colon resection versus tumor implant.  No obvious pelvic adenopathy, no palpable para-aortic adenopathy. R1 resection at the end of surgery   05/12/2021 Pathology Results   A. UTERUS, CERVIX, BILATERAL FALLOPIAN TUBES AND OVARIES:  - Fallopian tubes:       - Bilateral fallopian tubes involved by high-grade serous  carcinoma.  - Ovaries:       - Bilateral ovaries involved by high-grade serous carcinoma.  - Uterine cervix:       - Benign transformation zone.       - Negative for squamous intraepithelial lesion and malignancy.  - Endometrium:       - Inactive endometrium.       - Negative for atypical hyperplasia/EIN and malignancy.  - Myometrium:       - Negative for malignancy.  - Uterine serosa:       - Involved by metastatic high-grade serous carcinoma.   B. PERITONEAL IMPLANT, EXCISION:  - Metastatic high-grade serous  carcinoma.   C. OMENTUM, RESECTION:  - Metastatic high-grade serous carcinoma, multifocal.   COMMENT:   A. Immunohistochemical studies show tumor cells to be positive for CK7,  Pax-8, and WT-1, and negative for CK20, PR, and CDX-2. These findings  support the above diagnosis. A p53 shows strong and diffuse staining  within neoplastic cells, indicating mutated p53 status.   OVARY or FALLOPIAN TUBE or PRIMARY PERITONEUM: Resection   Procedure: Total hysterectomy and bilateral salpingo-oophorectomy  Specimen Integrity: Intact  Tumor Site: Favor right fallopian tube  Tumor Size: At least 1.7 cm  Histologic Type: High-grade serous carcinoma  Histologic Grade: High-grade  Ovarian Surface Involvement: Present, right and left  Fallopian Tube Surface Involvement: Present, right and left  Implants  (required for advanced stage serous/seromucinous borderline  tumors only): Present: Peritoneal and omental  Other Tissue/ Organ Involvement: Bilateral ovaries, uterine serosa,  omentum, pelvic peritoneum  Largest Extrapelvic Peritoneal Focus: At least 6.7 cm  Peritoneal/Ascitic Fluid Involvement: Not submitted/unknown  Chemotherapy Response Score (CRS): CRS 1 (no definite or minimal  response)  Regional Lymph Nodes: Not applicable (no lymph nodes submitted or found)   Distant Metastasis:       Distant Site(s) Involved: Not applicable  Pathologic Stage Classification (pTNM, AJCC 8th Edition): ypT3c, pN not  assigned  TNM classifiers: y (posttreatment)  Ancillary Studies: Can be performed upon request  Representative Tumor Block: A4  Comment(s): None    06/30/2021 Tumor Marker   Patient's tumor was tested for the following markers: CA-125. Results of the tumor marker test revealed 72.8.   08/11/2021 Tumor Marker   Patient's tumor was tested for the following markers: Ca-125. Results of the tumor marker test revealed 30.7.   09/08/2021 Tumor Marker   Patient's tumor was tested for the following markers: CA-125. Results of the tumor marker test revealed 21.4.   09/09/2021 Imaging   1. No new or progressive metastatic disease in the chest abdomen or pelvis. 2. Continued interval decrease in omental caking now with minimal residual nodular stranding. 3. Similar to minimally decreased mediastinal adenopathy. 4. Aortic Atherosclerosis (ICD10-I70.0).   10/04/2021 Tumor Marker   Patient's tumor was tested for the following markers: CA-125. Results of the tumor marker test revealed 22.2.   10/08/2021 -  Chemotherapy   She was started on lynparza   10/27/2021 Tumor Marker   Patient's tumor was tested for the following markers: CA-125. Results of the tumor marker test revealed 24.2.   12/06/2021 Tumor Marker   Patient's tumor was tested for the following markers: CA-125. Results of the  tumor marker test revealed 25.1.   01/09/2022 Imaging   1. New 0.4 cm fissural nodule of the superior segment left lower lobe, nonspecific although modestly suspicious for pulmonary metastatic disease. Attention on follow-up. 2. No other evidence of new metastatic disease in the chest, abdomen, or pelvis. 3. Minimal residual omental and peritoneal stranding and nodularity, unchanged. 4. No significant change in enlarged right hilar, pretracheal, and right superior mediastinal lymph nodes. 5. Status post partial right hemicolectomy and reanastomosis. 6. Status post hysterectomy.   Aortic Atherosclerosis (ICD10-I70.0).   01/09/2022 Tumor Marker   Patient's tumor was tested for the following markers: CA-125. Results of the tumor marker test revealed 29.2.   02/13/2022 Tumor Marker   Patient's tumor was tested for the following markers: CA-125. Results of the tumor marker test revealed 39.2.   03/14/2022 Tumor Marker   Patient's tumor was tested for the following markers: CA-125. Results of the tumor marker  test revealed 56.6.     PHYSICAL EXAMINATION: ECOG PERFORMANCE STATUS: 0 - Asymptomatic  Vitals:   03/14/22 1022  BP: (!) 142/88  Pulse: 60  Resp: 18  Temp: 97.8 F (36.6 C)  SpO2: 100%   Filed Weights   03/14/22 1022  Weight: 161 lb 9.6 oz (73.3 kg)    GENERAL:alert, no distress and comfortable NEURO: alert & oriented x 3 with fluent speech, no focal motor/sensory deficits  LABORATORY DATA:  I have reviewed the data as listed    Component Value Date/Time   NA 139 03/10/2022 0919   K 3.8 03/10/2022 0919   CL 107 03/10/2022 0919   CO2 24 03/10/2022 0919   GLUCOSE 91 03/10/2022 0919   BUN 22 03/10/2022 0919   CREATININE 0.93 03/10/2022 0919   CREATININE 0.68 03/14/2016 1350   CALCIUM 9.6 03/10/2022 0919   PROT 7.3 03/10/2022 0919   ALBUMIN 4.0 03/10/2022 0919   AST 21 03/10/2022 0919   ALT 17 03/10/2022 0919   ALKPHOS 69 03/10/2022 0919   BILITOT 0.7  03/10/2022 0919   GFRNONAA >60 03/10/2022 0919   GFRAA >60 03/19/2016 0455    No results found for: "SPEP", "UPEP"  Lab Results  Component Value Date   WBC 3.3 (L) 03/10/2022   NEUTROABS 1.3 (L) 03/10/2022   HGB 12.8 03/10/2022   HCT 38.2 03/10/2022   MCV 103.5 (H) 03/10/2022   PLT 258 03/10/2022      Chemistry      Component Value Date/Time   NA 139 03/10/2022 0919   K 3.8 03/10/2022 0919   CL 107 03/10/2022 0919   CO2 24 03/10/2022 0919   BUN 22 03/10/2022 0919   CREATININE 0.93 03/10/2022 0919   CREATININE 0.68 03/14/2016 1350      Component Value Date/Time   CALCIUM 9.6 03/10/2022 0919   ALKPHOS 69 03/10/2022 0919   AST 21 03/10/2022 0919   ALT 17 03/10/2022 0919   BILITOT 0.7 03/10/2022 0919

## 2022-03-14 NOTE — Assessment & Plan Note (Signed)
I reviewed recent test result with the patient and her sister Her tumor marker is rising I am concerned about early signs of cancer relapse We discussed extensively about plan of care if CT imaging show evidence of disease relapse Currently, she is not symptomatic She appears to be interested to entertain the idea of reduced dose single agent carboplatin in the future I plan to order CT imaging to be done next week and see her back to discuss treatment options

## 2022-03-14 NOTE — Assessment & Plan Note (Signed)
We have numerous goals of care discussions in the past She is interested to pursue further treatment She will continue taking olaparib for now until I see her back next week for follow-up

## 2022-03-14 NOTE — Assessment & Plan Note (Signed)
She will continue vitamin B12 injection monthly for now

## 2022-03-20 ENCOUNTER — Other Ambulatory Visit: Payer: Self-pay

## 2022-03-20 MED ORDER — OLAPARIB 100 MG PO TABS: 100.0000 mg | ORAL_TABLET | Freq: Two times a day (BID) | ORAL | 11 refills | Status: DC

## 2022-03-21 ENCOUNTER — Ambulatory Visit (HOSPITAL_COMMUNITY)
Admission: RE | Admit: 2022-03-21 | Discharge: 2022-03-21 | Disposition: A | Discharge status: Home / Self Care | Payer: Medicare Other | Source: Ambulatory Visit | Attending: Hematology and Oncology | Admitting: Hematology and Oncology

## 2022-03-21 ENCOUNTER — Encounter (HOSPITAL_COMMUNITY): Payer: Self-pay

## 2022-03-21 DIAGNOSIS — R911 Solitary pulmonary nodule: Secondary | ICD-10-CM | POA: Diagnosis not present

## 2022-03-21 DIAGNOSIS — I700 Atherosclerosis of aorta: Secondary | ICD-10-CM | POA: Diagnosis not present

## 2022-03-21 DIAGNOSIS — Z9889 Other specified postprocedural states: Secondary | ICD-10-CM | POA: Diagnosis not present

## 2022-03-21 DIAGNOSIS — C5701 Malignant neoplasm of right fallopian tube: Secondary | ICD-10-CM | POA: Diagnosis not present

## 2022-03-21 MED ORDER — SODIUM CHLORIDE (PF) 0.9 % IJ SOLN
INTRAMUSCULAR | Status: AC
  Filled 2022-03-21: qty 50

## 2022-03-21 MED ORDER — IOHEXOL 300 MG/ML  SOLN
100.0000 mL | Freq: Once | INTRAMUSCULAR | Status: AC | PRN
  Administered 2022-03-21: 100 mL via INTRAVENOUS

## 2022-03-23 ENCOUNTER — Inpatient Hospital Stay (HOSPITAL_BASED_OUTPATIENT_CLINIC_OR_DEPARTMENT_OTHER): Payer: Medicare Other | Admitting: Hematology and Oncology

## 2022-03-23 ENCOUNTER — Encounter: Payer: Self-pay | Admitting: Hematology and Oncology

## 2022-03-23 DIAGNOSIS — C786 Secondary malignant neoplasm of retroperitoneum and peritoneum: Secondary | ICD-10-CM | POA: Diagnosis not present

## 2022-03-23 DIAGNOSIS — C5701 Malignant neoplasm of right fallopian tube: Secondary | ICD-10-CM | POA: Diagnosis not present

## 2022-03-23 DIAGNOSIS — E538 Deficiency of other specified B group vitamins: Secondary | ICD-10-CM

## 2022-03-23 DIAGNOSIS — Z79899 Other long term (current) drug therapy: Secondary | ICD-10-CM | POA: Diagnosis not present

## 2022-03-23 DIAGNOSIS — Z79890 Hormone replacement therapy (postmenopausal): Secondary | ICD-10-CM | POA: Diagnosis not present

## 2022-03-23 DIAGNOSIS — I700 Atherosclerosis of aorta: Secondary | ICD-10-CM | POA: Diagnosis not present

## 2022-03-23 DIAGNOSIS — Z7189 Other specified counseling: Secondary | ICD-10-CM

## 2022-03-23 DIAGNOSIS — R911 Solitary pulmonary nodule: Secondary | ICD-10-CM | POA: Diagnosis not present

## 2022-03-23 NOTE — Assessment & Plan Note (Signed)
I have reviewed multiple CT imaging with the patient Despite recent elevated tumor marker, she has no measurable disease on her imaging study We discussed the risk and benefits of continuing olaparib versus switching her to bevacizumab Ultimately, she is in agreement to continue on olaparib as scheduled I plan to see her again next month for further follow-up

## 2022-03-23 NOTE — Assessment & Plan Note (Signed)
We discussed goals of care Quality of life is important to her She is in agreement to continue on olaparib

## 2022-03-23 NOTE — Assessment & Plan Note (Signed)
She is feeling better We will continue vitamin B12 injection monthly 

## 2022-03-23 NOTE — Progress Notes (Signed)
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[facility]) I have reviewed multiple CT imaging with the patient Despite recent elevated tumor marker, she has no measurable disease on her imaging study We discussed the risk and benefits of continuing olaparib versus switching her to bevacizumab Ultimately, she is in agreement to continue on olaparib as scheduled I plan to see her again next month for further follow-up  Vitamin B12 deficiency She is feeling better We will continue vitamin B12 injection monthly  Goals of care, counseling/discussion We discussed goals of care Quality of life is important to her She is in agreement to continue on olaparib  No orders of the defined types were placed in this encounter.   All questions were answered. The patient knows to call the clinic with any problems, questions or concerns. The total time spent in the appointment was 40 minutes encounter with patients including review of chart and various tests results, discussions about plan of care and coordination of care plan   Heath Lark, MD 03/23/2022 5:27 PM  INTERVAL HISTORY: Please see below for problem oriented charting. she returns for treatment follow-up and review imaging results She is anxious but remain asymptomatic We spent majority of our discussion reviewing imaging study and discussed test results  REVIEW OF SYSTEMS:   Constitutional: Denies fevers, chills or abnormal weight loss Eyes: Denies blurriness of vision Ears, nose, mouth, throat, and face: Denies mucositis or sore throat Respiratory: Denies cough, dyspnea or wheezes Cardiovascular: Denies palpitation, chest discomfort or lower extremity swelling Gastrointestinal:   Denies nausea, heartburn or change in bowel habits Skin: Denies abnormal skin rashes Lymphatics: Denies new lymphadenopathy or easy bruising Neurological:Denies numbness, tingling or new weaknesses Behavioral/Psych: Mood is stable, no new changes  All other systems were reviewed with the patient and are negative.  I have reviewed the past medical history, past surgical history, social history and family history with the patient and they are unchanged from previous note.  ALLERGIES:  is allergic to barium-containing compounds, hydrocodone, erythromycin, and zofran [ondansetron].  MEDICATIONS:  Current Outpatient Medications  Medication Sig Dispense Refill   Calcium Carb-Cholecalciferol (CALCIUM 600+D3 PO) Take 1 tablet by mouth in the morning and at bedtime.     cetirizine (ZYRTEC) 10 MG tablet Take 10 mg by mouth every 30 (thirty) days. As needed     cholecalciferol (VITAMIN D3) 25 MCG (1000 UNIT) tablet Take 5,000 Units by mouth in the morning.     diazepam (VALIUM) 5 MG tablet Take 1 tablet (5 mg total) by mouth daily as needed for anxiety. 30 tablet    escitalopram (LEXAPRO) 10 MG tablet TAKE 1 & 1/2 (ONE & ONE-HALF) TABLETS BY MOUTH ONCE DAILY 45 tablet 0   Ginger-Calcium Carbonate (DRAMAMINE GINGER CHEWS PO) Take by mouth.     Homeopathic Products (T-RELIEF ARNICA + 12) CREA Apply topically.     levothyroxine (SYNTHROID, LEVOTHROID) 75 MCG tablet Take 75 mcg by mouth daily before breakfast.  3   olaparib (LYNPARZA) 100 MG tablet Take 1 tablet (100 mg total) by mouth 2 (two) times daily. Swallow whole. May take with food to decrease nausea and vomiting. 60 tablet 11   ondansetron (ZOFRAN) 8 MG tablet Take 1 tablet (8 mg total) by mouth every 8 (eight) hours as needed for  refractory nausea / vomiting. (Patient not taking: Reported on 12/05/2021) 30 tablet 1   oxyCODONE-acetaminophen (PERCOCET/ROXICET) 5-325 MG tablet Take 1 tablet by mouth every 6 (six) hours as needed for severe pain. 30  tablet 0   Polyethyl Glycol-Propyl Glycol (SYSTANE) 0.4-0.3 % SOLN Place 1-2 drops into both eyes 3 (three) times daily as needed (dry/irritated eyes.).     prochlorperazine (COMPAZINE) 10 MG tablet Take 1 tablet (10 mg total) by mouth every 6 (six) hours as needed (Nausea or vomiting). (Patient not taking: Reported on 12/05/2021) 30 tablet 1   SUNSCREEN SPF50 EX      valACYclovir (VALTREX) 1000 MG tablet Take 1 tablet (1,000 mg total) by mouth daily as needed (Fever Blister).     zolpidem (AMBIEN) 10 MG tablet Take 1 tablet (10 mg total) by mouth at bedtime as needed. for sleep 30 tablet 2   No current facility-administered medications for this visit.    SUMMARY OF ONCOLOGIC HISTORY: Oncology History Overview Note  Normal MMR HRD positive   Cancer of right fallopian tube ([place])  01/05/2021 Pathology Results   SURGICAL PATHOLOGY  CASE: [id number]  PATIENT: [facility]  Surgical Pathology Report   Reason for Addendum #1:  Immunohistochemistry results  Reason for Addendum #2:  DNA Mismatch Repair IHC Results   Clinical History: history of colon cancer, now with omental thickening  (cm)   FINAL MICROSCOPIC DIAGNOSIS:   A. OMENTUM, NEEDLE CORE BIOPSY:  - Metastatic poorly differentiated adenocarcinoma.  See comment   COMMENT:   Immunohistochemical stains show that the tumor cells are positive for CK7 and negative for CDX2 and CK20.  This immunoprofile is nonspecific and differential diagnosis can include an upper gastrointestinal, breast and lung primary among other possibilities.  Only a very small fraction of primary colonic adenocarcinoma was immunoprofile.  Clinical and radiologic correlation suggested.   Additional immunohistochemical stains are performed and show that the tumor cells are positive for WT1, PAX8 and ER.  Immunostain for p53 shows a clonal overexpression pattern.  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  Clinical and radiologic correlation is suggested.    01/19/2021 Tumor Marker   Patient's tumor was tested for the following markers: CA-125. Results of the tumor marker test revealed 6790.   01/28/2021 PET scan   1. Examination is positive for extensive FDG avid peritoneal disease within the abdomen and pelvis including multiple implants upon the surface of liver and extensive omental caking. Moderate ascites noted within the pelvis. 2. FDG avid retroperitoneal, retrocrural, right internal mammary, and mediastinal lymph nodes compatible with metastatic adenopathy. 3. Small right pleural effusion. 4.  Aortic Atherosclerosis (ICD10-I70.0).     01/31/2021 Initial Diagnosis   Primary peritoneal carcinomatosis ([place])   01/31/2021 Cancer Staging   Staging form: Ovary, Fallopian Tube, and Primary Peritoneal Carcinoma, AJCC 8th Edition - Clinical stage from 01/31/2021: FIGO Stage IV (cT2b, cN1b, cM1) - Signed by Heath Lark, MD on 01/31/2021 Stage prefix: Initial diagnosis   02/14/2021 Tumor Marker   Patient's tumor was tested for the following markers: CA-125. Results of the tumor marker test revealed 8668.   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 Remainder of testing was negative/normal. The report date is 02/09/2021.  The CancerNext-Expanded + RNAinsight gene panel offered by [employer]  and includes sequencing and rearrangement analysis for the following 77 genes: IP, ALK, APC*, ATM*, AXIN2, BAP1, BARD1, BLM, BMPR1A, BRCA1*, BRCA2*, BRIP1*, CDC73, CDH1*,CDK4, CDKN1B, CDKN2A, CHEK2*, CTNNA1, DICER1, FANCC, FH, FLCN, GALNT12, KIF1B, LZTR1, MAX, MEN1, MET, MLH1*, MSH2*, MSH3, MSH6*, MUTYH*, NBN, NF1*, NF2, NTHL1, PALB2*, PHOX2B, PMS2*, POT1, PRKAR1A, PTCH1, PTEN*, RAD51C*, RAD51D*,RB1, RECQL, RET, SDHA, SDHAF2,  SDHB, SDHC, SDHD, SMAD4, SMARCA4, SMARCB1, SMARCE1, STK11, SUFU, TMEM127, TP53*,TSC1, TSC2, VHL and XRCC2 (sequencing and deletion/duplication); EGFR, EGLN1, HOXB13, KIT, MITF, PDGFRA, POLD1 and POLE (sequencing only); EPCAM and GREM1 (deletion/duplication only).   03/10/2021 Tumor Marker   Patient's tumor was tested for the following markers: CA-125. Results of the tumor marker test revealed 2002.   04/14/2021 Tumor Marker   Patient's tumor was tested for the following markers: CA-125. Results of the tumor marker test revealed 278.   04/26/2021 Imaging   Significant decrease in peritoneal carcinomatosis since previous study, with resolution of ascites.   Decreased anterior mediastinal and retrocrural lymphadenopathy.   No new or progressive metastatic disease within the chest, abdomen, or pelvis.   Aortic Atherosclerosis (ICD10-I70.0).   04/29/2021 Tumor Marker   Patient's tumor was tested for the following markers: CA-125. Results of the tumor marker test revealed 134.   05/12/2021 Surgery   Tumor debulking including robotic-assisted laparoscopic total hysterectomy with bilateral salpingoophorectomy, lysis of adhesions, excision and fulguration of peritoneal nodules, mini laparotomy for intra-abdominal palpation and omentectomy  Findings: : On exam under anesthesia, small mobile uterus.  On intra-abdominal entry, normal upper abdominal survey including liver and stomach.  Right diaphragm with small peritoneal studding, all lesions less than 5 mm.  Omentum with an approximately 5 cm mass adherent to the lower anterior abdominal wall and bladder peritoneum.  Normal 6 cm uterus.  Somewhat atrophic appearing bilateral adnexa with tumor implants.  Sigmoid and rectal colon and mesentery with moderate peritoneal disease, all lesions less than 5 mm.  Multiple peritoneal implants on bilateral pelvic sidewalls and in the posterior cul-de-sac, all measuring less than 5 mm.  Multiple omental implants  measuring several mm.  Approximately 1 cm area within the transverse colon mesentery, unclear if this is related to scar tissue from prior colon resection versus tumor implant.  No obvious pelvic adenopathy, no palpable para-aortic adenopathy. R1 resection at the end of surgery   05/12/2021 Pathology Results   A. UTERUS, CERVIX, BILATERAL FALLOPIAN TUBES AND OVARIES:  - Fallopian tubes:       - Bilateral fallopian tubes involved by high-grade serous  carcinoma.  - Ovaries:       - Bilateral ovaries involved by high-grade serous carcinoma.  - Uterine cervix:       - Benign transformation zone.       - Negative for squamous intraepithelial lesion and malignancy.  - Endometrium:       - Inactive endometrium.       - Negative for atypical hyperplasia/EIN and malignancy.  - Myometrium:       - Negative for malignancy.  - Uterine serosa:       - Involved by metastatic high-grade serous carcinoma.   B. PERITONEAL IMPLANT, EXCISION:  - Metastatic high-grade serous carcinoma.   C. OMENTUM, RESECTION:  - Metastatic high-grade serous carcinoma, multifocal.   COMMENT:   A. Immunohistochemical studies show tumor cells to be positive for CK7,  Pax-8, and WT-1, and negative for CK20, PR, and CDX-2. These findings  support the above diagnosis. A p53 shows strong and diffuse staining  within neoplastic  cells, indicating mutated p53 status.   OVARY or FALLOPIAN TUBE or PRIMARY PERITONEUM: Resection   Procedure: Total hysterectomy and bilateral salpingo-oophorectomy  Specimen Integrity: Intact  Tumor Site: Favor right fallopian tube  Tumor Size: At least 1.7 cm  Histologic Type: High-grade serous carcinoma  Histologic Grade: High-grade  Ovarian Surface Involvement: Present, right and left  Fallopian Tube Surface Involvement: Present, right and left  Implants (required for advanced stage serous/seromucinous borderline  tumors only): Present: Peritoneal and omental  Other Tissue/ Organ  Involvement: Bilateral ovaries, uterine serosa,  omentum, pelvic peritoneum  Largest Extrapelvic Peritoneal Focus: At least 6.7 cm  Peritoneal/Ascitic Fluid Involvement: Not submitted/unknown  Chemotherapy Response Score (CRS): CRS 1 (no definite or minimal  response)  Regional Lymph Nodes: Not applicable (no lymph nodes submitted or found)   Distant Metastasis:       Distant Site(s) Involved: Not applicable  Pathologic Stage Classification (pTNM, AJCC 8th Edition): ypT3c, pN not  assigned  TNM classifiers: y (posttreatment)  Ancillary Studies: Can be performed upon request  Representative Tumor Block: A4  Comment(s): None    06/30/2021 Tumor Marker   Patient's tumor was tested for the following markers: CA-125. Results of the tumor marker test revealed 72.8.   08/11/2021 Tumor Marker   Patient's tumor was tested for the following markers: Ca-125. Results of the tumor marker test revealed 30.7.   09/08/2021 Tumor Marker   Patient's tumor was tested for the following markers: CA-125. Results of the tumor marker test revealed 21.4.   09/09/2021 Imaging   1. No new or progressive metastatic disease in the chest abdomen or pelvis. 2. Continued interval decrease in omental caking now with minimal residual nodular stranding. 3. Similar to minimally decreased mediastinal adenopathy. 4. Aortic Atherosclerosis (ICD10-I70.0).   10/04/2021 Tumor Marker   Patient's tumor was tested for the following markers: CA-125. Results of the tumor marker test revealed 22.2.   10/08/2021 -  Chemotherapy   She was started on lynparza   10/27/2021 Tumor Marker   Patient's tumor was tested for the following markers: CA-125. Results of the tumor marker test revealed 24.2.   12/06/2021 Tumor Marker   Patient's tumor was tested for the following markers: CA-125. Results of the tumor marker test revealed 25.1.   01/09/2022 Imaging   1. New 0.4 cm fissural nodule of the superior segment left lower lobe,  nonspecific although modestly suspicious for pulmonary metastatic disease. Attention on follow-up. 2. No other evidence of new metastatic disease in the chest, abdomen, or pelvis. 3. Minimal residual omental and peritoneal stranding and nodularity, unchanged. 4. No significant change in enlarged right hilar, pretracheal, and right superior mediastinal lymph nodes. 5. Status post partial right hemicolectomy and reanastomosis. 6. Status post hysterectomy.   Aortic Atherosclerosis (ICD10-I70.0).   01/09/2022 Tumor Marker   Patient's tumor was tested for the following markers: CA-125. Results of the tumor marker test revealed 29.2.   02/13/2022 Tumor Marker   Patient's tumor was tested for the following markers: CA-125. Results of the tumor marker test revealed 39.2.   03/14/2022 Tumor Marker   Patient's tumor was tested for the following markers: CA-125. Results of the tumor marker test revealed 56.6.   03/23/2022 Imaging   1. No definitive findings to suggest metastatic disease to the chest, abdomen or pelvis. 2. Resolution of previously noted perifissural nodule in the superior aspect of the left major fissure. New 3 mm subpleural nodule in the periphery of the left lower lobe, nonspecific, but statistically likely benign.  Attention at time of follow-up imaging is recommended to ensure stability or regression. 3. Aortic atherosclerosis. 4. Additional incidental findings, as above.     PHYSICAL EXAMINATION: ECOG PERFORMANCE STATUS: 1 - Symptomatic but completely ambulatory  Vitals:   03/23/22 1408  BP: 139/85  Pulse: 73  Resp: 18  Temp: (!) 97.4 F (36.3 C)  SpO2: 100%   Filed Weights   03/23/22 1408  Weight: 160 lb 3.2 oz (72.7 kg)    GENERAL:alert, no distress and comfortable NEURO: alert & oriented x 3 with fluent speech, no focal motor/sensory deficits  LABORATORY DATA:  I have reviewed the data as listed    Component Value Date/Time   NA 139 03/10/2022 0919   K  3.8 03/10/2022 0919   CL 107 03/10/2022 0919   CO2 24 03/10/2022 0919   GLUCOSE 91 03/10/2022 0919   BUN 22 03/10/2022 0919   CREATININE 0.93 03/10/2022 0919   CREATININE 0.68 03/14/2016 1350   CALCIUM 9.6 03/10/2022 0919   PROT 7.3 03/10/2022 0919   ALBUMIN 4.0 03/10/2022 0919   AST 21 03/10/2022 0919   ALT 17 03/10/2022 0919   ALKPHOS 69 03/10/2022 0919   BILITOT 0.7 03/10/2022 0919   GFRNONAA >60 03/10/2022 0919   GFRAA >60 03/19/2016 0455    No results found for: "SPEP", "UPEP"  Lab Results  Component Value Date   WBC 3.3 (L) 03/10/2022   NEUTROABS 1.3 (L) 03/10/2022   HGB 12.8 03/10/2022   HCT 38.2 03/10/2022   MCV 103.5 (H) 03/10/2022   PLT 258 03/10/2022      Chemistry      Component Value Date/Time   NA 139 03/10/2022 0919   K 3.8 03/10/2022 0919   CL 107 03/10/2022 0919   CO2 24 03/10/2022 0919   BUN 22 03/10/2022 0919   CREATININE 0.93 03/10/2022 0919   CREATININE 0.68 03/14/2016 1350      Component Value Date/Time   CALCIUM 9.6 03/10/2022 0919   ALKPHOS 69 03/10/2022 0919   AST 21 03/10/2022 0919   ALT 17 03/10/2022 0919   BILITOT 0.7 03/10/2022 0919       RADIOGRAPHIC STUDIES: I have reviewed multiple CT imaging with the patient I have personally reviewed the radiological images as listed and agreed with the findings in the report. CT CHEST ABDOMEN PELVIS W CONTRAST  Result Date: 03/22/2022 CLINICAL DATA:  76 year old female with history of cancer of the right fallopian tube. Follow-up study to evaluate for treatment response. * Tracking Code: BO * EXAM: CT CHEST, ABDOMEN, AND PELVIS WITH CONTRAST TECHNIQUE: Multidetector CT imaging of the chest, abdomen and pelvis was performed following the standard protocol during bolus administration of intravenous contrast. RADIATION DOSE REDUCTION: This exam was performed according to the departmental dose-optimization program which includes automated exposure control, adjustment of the mA and/or kV according  to patient size and/or use of iterative reconstruction technique. CONTRAST:  140m OMNIPAQUE IOHEXOL 300 MG/ML  SOLN COMPARISON:  CT of the chest, abdomen and pelvis 12/07/2021. FINDINGS: CT CHEST FINDINGS Cardiovascular: Heart size is normal. There is no significant pericardial fluid, thickening or pericardial calcification. Atherosclerotic calcifications are noted in the thoracic aorta. No definite coronary artery calcifications. Mediastinum/Nodes: No pathologically enlarged mediastinal or hilar lymph nodes. Several borderline enlarged mediastinal and hilar lymph nodes are noted, nonspecific. Esophagus is unremarkable in appearance. No axillary lymphadenopathy. Lungs/Pleura: No suspicious appearing pulmonary nodules or masses are noted. Specifically, previously noted perifissural nodule in the superior aspect of the left major fissure  has resolved compared to the prior study. 3 mm subpleural nodule in the periphery of the left lower lobe (axial image 129 of series 4), new, but highly nonspecific and statistically likely a benign subpleural lymph node. No acute consolidative airspace disease. No pleural effusions. Areas of mild dependent subsegmental atelectasis and/or scarring are noted throughout the lower lobes of the lungs bilaterally. Musculoskeletal: Multiple old healed posterior left-sided rib fractures. There are no aggressive appearing lytic or blastic lesions noted in the visualized portions of the skeleton. CT ABDOMEN PELVIS FINDINGS Hepatobiliary: No suspicious cystic or solid hepatic lesions. Diffuse low attenuation throughout the hepatic parenchyma, indicative of a background of hepatic steatosis. No intra or extrahepatic biliary ductal dilatation. Gallbladder is normal in appearance. Pancreas: No pancreatic mass. No pancreatic ductal dilatation. No pancreatic or peripancreatic fluid collections or inflammatory changes. Spleen: Unremarkable. Adrenals/Urinary Tract: Bilateral kidneys and bilateral  adrenal glands are normal in appearance. No hydroureteronephrosis. Urinary bladder is normal in appearance. Stomach/Bowel: The appearance of the stomach is normal. There is no pathologic dilatation of small bowel or colon. Postoperative changes are noted in the region of the cecum where there is an apparent ileocolonic anastomosis. Status post omentectomy. Appendix is surgically absent. Vascular/Lymphatic: Atherosclerosis in the abdominal aorta and pelvic vasculature, without evidence of aneurysm or dissection. No lymphadenopathy noted in the abdomen or pelvis. Reproductive: Status post total abdominal hysterectomy and bilateral salpingo-oophorectomy. No unexpected soft tissue mass noted in the pelvis to suggest locally recurrent disease. Other: No significant volume of ascites.  No pneumoperitoneum. Musculoskeletal: There are no aggressive appearing lytic or blastic lesions noted in the visualized portions of the skeleton. IMPRESSION: 1. No definitive findings to suggest metastatic disease to the chest, abdomen or pelvis. 2. Resolution of previously noted perifissural nodule in the superior aspect of the left major fissure. New 3 mm subpleural nodule in the periphery of the left lower lobe, nonspecific, but statistically likely benign. Attention at time of follow-up imaging is recommended to ensure stability or regression. 3. Aortic atherosclerosis. 4. Additional incidental findings, as above. Electronically Signed   By: Vinnie Langton M.D.   On: 03/22/2022 08:46

## 2022-03-29 ENCOUNTER — Other Ambulatory Visit: Payer: Self-pay | Admitting: Hematology and Oncology

## 2022-04-06 ENCOUNTER — Telehealth: Payer: Self-pay | Admitting: Pharmacy Technician

## 2022-04-06 NOTE — Telephone Encounter (Signed)
Oral Oncology Patient Advocate Encounter   Received notification re-enrollment for assistance for Lynparza through [employer] has been approved. Patient may continue to receive their medication at $0 from this program.    [employer] phone number [phone].   Effective dates: 04/06/22 through 06/26/23   Lady Deutscher, [place] Patient [place] Direct Number: [phone]  Fax: [phone]

## 2022-04-07 ENCOUNTER — Other Ambulatory Visit: Payer: Self-pay | Admitting: Hematology and Oncology

## 2022-04-07 ENCOUNTER — Telehealth: Payer: Self-pay

## 2022-04-07 MED ORDER — OXYCODONE-ACETAMINOPHEN 5-325 MG PO TABS: 1.0000 | ORAL_TABLET | Freq: Four times a day (QID) | ORAL | 0 refills | Status: DC | PRN

## 2022-04-07 NOTE — Telephone Encounter (Signed)
Called and given message Rx sent. She will call the office back if pharmacy does not have the Rx.

## 2022-04-07 NOTE — Telephone Encounter (Signed)
She called and left a message requesting a Percocet refill to [facility].

## 2022-04-07 NOTE — Telephone Encounter (Signed)
Done, make sure they have it. It's friday

## 2022-04-11 ENCOUNTER — Other Ambulatory Visit: Payer: Medicare Other

## 2022-04-11 ENCOUNTER — Encounter: Payer: Self-pay | Admitting: Hematology and Oncology

## 2022-04-11 ENCOUNTER — Ambulatory Visit: Payer: Medicare Other | Admitting: Pharmacist

## 2022-04-11 ENCOUNTER — Inpatient Hospital Stay: Payer: Medicare Other | Attending: Genetic Counselor

## 2022-04-11 ENCOUNTER — Ambulatory Visit: Payer: Medicare Other

## 2022-04-11 ENCOUNTER — Inpatient Hospital Stay (HOSPITAL_BASED_OUTPATIENT_CLINIC_OR_DEPARTMENT_OTHER): Payer: Medicare Other | Admitting: Hematology and Oncology

## 2022-04-11 ENCOUNTER — Inpatient Hospital Stay: Payer: Medicare Other

## 2022-04-11 VITALS — BP 141/74 | HR 61 | Temp 97.4°F | Resp 18 | Ht 65.0 in | Wt 159.4 lb

## 2022-04-11 DIAGNOSIS — C5701 Malignant neoplasm of right fallopian tube: Secondary | ICD-10-CM | POA: Insufficient documentation

## 2022-04-11 DIAGNOSIS — Z79890 Hormone replacement therapy (postmenopausal): Secondary | ICD-10-CM | POA: Insufficient documentation

## 2022-04-11 DIAGNOSIS — E538 Deficiency of other specified B group vitamins: Secondary | ICD-10-CM

## 2022-04-11 DIAGNOSIS — Z79899 Other long term (current) drug therapy: Secondary | ICD-10-CM | POA: Insufficient documentation

## 2022-04-11 DIAGNOSIS — C482 Malignant neoplasm of peritoneum, unspecified: Secondary | ICD-10-CM

## 2022-04-11 LAB — CMP (CANCER CENTER ONLY)
ALT: 12 U/L (ref 0–44)
AST: 15 U/L (ref 15–41)
Albumin: 4.2 g/dL (ref 3.5–5.0)
Alkaline Phosphatase: 82 U/L (ref 38–126)
Anion gap: 7 (ref 5–15)
BUN: 17 mg/dL (ref 8–23)
CO2: 26 mmol/L (ref 22–32)
Calcium: 10 mg/dL (ref 8.9–10.3)
Chloride: 104 mmol/L (ref 98–111)
Creatinine: 0.84 mg/dL (ref 0.44–1.00)
GFR, Estimated: >60 mL/min (ref >60)
Glucose, Bld: 87 mg/dL (ref 70–99)
Potassium: 3.9 mmol/L (ref 3.5–5.1)
Sodium: 137 mmol/L (ref 135–145)
Total Bilirubin: 0.8 mg/dL (ref 0.3–1.2)
Total Protein: 7.6 g/dL (ref 6.5–8.1)

## 2022-04-11 LAB — CBC WITH DIFFERENTIAL (CANCER CENTER ONLY)
Abs Immature Granulocytes: 0.02 10*3/uL (ref 0.00–0.07)
Basophils Absolute: 0 10*3/uL (ref 0.0–0.1)
Basophils Relative: 0 %
Eosinophils Absolute: 0.1 10*3/uL (ref 0.0–0.5)
Eosinophils Relative: 1 %
HCT: 38 % (ref 36.0–46.0)
Hemoglobin: 13 g/dL (ref 12.0–15.0)
Immature Granulocytes: 0 %
Lymphocytes Relative: 38 %
Lymphs Abs: 2 10*3/uL (ref 0.7–4.0)
MCH: 35.1 pg — ABNORMAL HIGH (ref 26.0–34.0)
MCHC: 34.2 g/dL (ref 30.0–36.0)
MCV: 102.7 fL — ABNORMAL HIGH (ref 80.0–100.0)
Monocytes Absolute: 0.4 10*3/uL (ref 0.1–1.0)
Monocytes Relative: 8 %
Neutro Abs: 2.8 10*3/uL (ref 1.7–7.7)
Neutrophils Relative %: 53 %
Platelet Count: 267 10*3/uL (ref 150–400)
RBC: 3.7 MIL/uL — ABNORMAL LOW (ref 3.87–5.11)
RDW: 13.2 % (ref 11.5–15.5)
WBC Count: 5.4 10*3/uL (ref 4.0–10.5)
nRBC: 0 % (ref 0.0–0.2)

## 2022-04-11 LAB — VITAMIN B12: Vitamin B-12: 212 pg/mL (ref 180–914)

## 2022-04-11 MED ORDER — CYANOCOBALAMIN 1000 MCG/ML IJ SOLN
1000.0000 ug | Freq: Once | INTRAMUSCULAR | Status: AC
  Administered 2022-04-11: 1000 ug via INTRAMUSCULAR
  Filled 2022-04-11: qty 1

## 2022-04-11 NOTE — Progress Notes (Signed)
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   All questions were answered. The patient knows to call the clinic with any problems, questions or concerns. The total time spent in the appointment was 20 minutes encounter with patients including review of chart and various tests results, discussions about plan of care and coordination of care plan   Heath Lark, MD 04/11/2022 2:33 PM  INTERVAL HISTORY: Please see below for problem oriented charting. she returns for treatment follow-up on maintenance olaparib She tolerated treatment well No new side effects Denies abdominal pain, bloating or changes in bowel habits  REVIEW OF SYSTEMS:   Constitutional: Denies fevers, chills or abnormal weight loss Eyes: Denies blurriness of vision Ears, nose, mouth, throat, and face: Denies mucositis or sore throat Respiratory: Denies cough, dyspnea or wheezes Cardiovascular: Denies palpitation, chest discomfort or lower extremity swelling Gastrointestinal:  Denies nausea, heartburn or change in bowel habits Skin: Denies abnormal skin rashes Lymphatics: Denies new lymphadenopathy or easy bruising Neurological:Denies numbness, tingling or new weaknesses Behavioral/Psych: Mood is stable, no new changes  All other systems were reviewed with the patient and are negative.  I have reviewed the past medical history, past surgical history,  social history and family history with the patient and they are unchanged from previous note.  ALLERGIES:  is allergic to barium-containing compounds, hydrocodone, erythromycin, and zofran [ondansetron].  MEDICATIONS:  Current Outpatient Medications  Medication Sig Dispense Refill   Calcium Carb-Cholecalciferol (CALCIUM 600+D3 PO) Take 1 tablet by mouth in the morning and at bedtime.     cetirizine (ZYRTEC) 10 MG tablet Take 10 mg by mouth every 30 (thirty) days. As needed     cholecalciferol (VITAMIN D3) 25 MCG (1000 UNIT) tablet Take 5,000 Units by mouth in the morning.     diazepam (VALIUM) 5 MG tablet Take 1 tablet (5 mg total) by mouth daily as needed for anxiety. 30 tablet    escitalopram (LEXAPRO) 10 MG tablet TAKE 1 & 1/2 (ONE & ONE-HALF) TABLETS BY MOUTH ONCE DAILY 45 tablet 0   Ginger-Calcium Carbonate (DRAMAMINE GINGER CHEWS PO) Take by mouth.     Homeopathic Products (T-RELIEF ARNICA + 12) CREA Apply topically.     levothyroxine (SYNTHROID, LEVOTHROID) 75 MCG tablet Take 75 mcg by mouth daily before breakfast.  3   olaparib (LYNPARZA) 100 MG tablet Take 1 tablet (100 mg total) by mouth 2 (two) times daily. Swallow whole. May take with food to decrease nausea and vomiting. 60 tablet 11   ondansetron (ZOFRAN) 8 MG tablet Take 1 tablet (8 mg total) by mouth every 8 (eight) hours as needed for refractory nausea / vomiting. (Patient not taking: Reported on 12/05/2021) 30 tablet 1   oxyCODONE-acetaminophen (PERCOCET/ROXICET) 5-325 MG tablet Take 1 tablet by mouth every 6 (six) hours as needed for severe pain. 30 tablet 0   Polyethyl Glycol-Propyl Glycol (SYSTANE) 0.4-0.3 % SOLN Place 1-2 drops into both eyes 3 (three) times daily as needed (dry/irritated eyes.).  prochlorperazine (COMPAZINE) 10 MG tablet Take 1 tablet (10 mg total) by mouth every 6 (six) hours as needed (Nausea or vomiting). (Patient not taking: Reported on 12/05/2021) 30 tablet 1   SUNSCREEN SPF50 EX      valACYclovir  (VALTREX) 1000 MG tablet Take 1 tablet (1,000 mg total) by mouth daily as needed (Fever Blister).     zolpidem (AMBIEN) 10 MG tablet Take 1 tablet (10 mg total) by mouth at bedtime as needed. for sleep 30 tablet 2   No current facility-administered medications for this visit.    SUMMARY OF ONCOLOGIC HISTORY: Oncology History Overview Note  Normal MMR HRD positive   Cancer of right fallopian tube ([place])  01/05/2021 Pathology Results   SURGICAL PATHOLOGY  CASE: [id number]  PATIENT: [facility]  Surgical Pathology Report   Reason for Addendum #1:  Immunohistochemistry results  Reason for Addendum #2:  DNA Mismatch Repair IHC Results   Clinical History: history of colon cancer, now with omental thickening  (cm)   FINAL MICROSCOPIC DIAGNOSIS:   A. OMENTUM, NEEDLE CORE BIOPSY:  - Metastatic poorly differentiated adenocarcinoma.  See comment   COMMENT:   Immunohistochemical stains show that the tumor cells are positive for CK7 and negative for CDX2 and CK20.  This immunoprofile is nonspecific and differential diagnosis can include an upper gastrointestinal, breast and lung primary among other possibilities.  Only a very small fraction of primary colonic adenocarcinoma was immunoprofile.  Clinical and radiologic correlation suggested.   Additional immunohistochemical stains are performed and show that the tumor cells are positive for WT1, PAX8 and ER.  Immunostain for p53 shows a clonal overexpression pattern.  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  Clinical and radiologic correlation is suggested.    01/19/2021 Tumor Marker   Patient's tumor was tested for the following markers: CA-125. Results of the tumor marker test revealed 6790.   01/28/2021 PET scan   1. Examination is positive for extensive FDG avid peritoneal disease within the abdomen and pelvis including multiple implants upon the surface of  liver and extensive omental caking. Moderate ascites noted within the pelvis. 2. FDG avid retroperitoneal, retrocrural, right internal mammary, and mediastinal lymph nodes compatible with metastatic adenopathy. 3. Small right pleural effusion. 4.  Aortic Atherosclerosis (ICD10-I70.0).     01/31/2021 Initial Diagnosis   Primary peritoneal carcinomatosis ([place])   01/31/2021 Cancer Staging   Staging form: Ovary, Fallopian Tube, and Primary Peritoneal Carcinoma, AJCC 8th Edition - Clinical stage from 01/31/2021: FIGO Stage IV (cT2b, cN1b, cM1) - Signed by Heath Lark, MD on 01/31/2021 Stage prefix: Initial diagnosis   02/14/2021 Tumor Marker   Patient's tumor was tested for the following markers: CA-125. Results of the tumor marker test revealed 8668.   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 Remainder of testing was negative/normal. The report date is 02/09/2021.  The CancerNext-Expanded + RNAinsight gene panel offered by [employer] and includes sequencing and rearrangement analysis for the following 77 genes: IP, ALK, APC*, ATM*, AXIN2, BAP1, BARD1, BLM, BMPR1A, BRCA1*, BRCA2*, BRIP1*, CDC73, CDH1*,CDK4, CDKN1B, CDKN2A, CHEK2*, CTNNA1, DICER1, FANCC, FH, FLCN, GALNT12, KIF1B, LZTR1, MAX, MEN1, MET, MLH1*, MSH2*, MSH3, MSH6*, MUTYH*, NBN, NF1*, NF2, NTHL1, PALB2*, PHOX2B, PMS2*, POT1, PRKAR1A, PTCH1, PTEN*, RAD51C*, RAD51D*,RB1, RECQL, RET, SDHA, SDHAF2, SDHB, SDHC, SDHD,  SMAD4, SMARCA4, SMARCB1, SMARCE1, STK11, SUFU, TMEM127, TP53*,TSC1, TSC2, VHL and XRCC2 (sequencing and deletion/duplication); EGFR, EGLN1, HOXB13, KIT, MITF, PDGFRA, POLD1 and POLE (sequencing only); EPCAM and GREM1 (deletion/duplication only).   03/10/2021 Tumor Marker   Patient's tumor was tested for the following markers: CA-125. Results of the tumor marker test  revealed 2002.   04/14/2021 Tumor Marker   Patient's tumor was tested for the following markers: CA-125. Results of the tumor marker test revealed 278.   04/26/2021 Imaging   Significant decrease in peritoneal carcinomatosis since previous study, with resolution of ascites.   Decreased anterior mediastinal and retrocrural lymphadenopathy.   No new or progressive metastatic disease within the chest, abdomen, or pelvis.   Aortic Atherosclerosis (ICD10-I70.0).   04/29/2021 Tumor Marker   Patient's tumor was tested for the following markers: CA-125. Results of the tumor marker test revealed 134.   05/12/2021 Surgery   Tumor debulking including robotic-assisted laparoscopic total hysterectomy with bilateral salpingoophorectomy, lysis of adhesions, excision and fulguration of peritoneal nodules, mini laparotomy for intra-abdominal palpation and omentectomy  Findings: : On exam under anesthesia, small mobile uterus.  On intra-abdominal entry, normal upper abdominal survey including liver and stomach.  Right diaphragm with small peritoneal studding, all lesions less than 5 mm.  Omentum with an approximately 5 cm mass adherent to the lower anterior abdominal wall and bladder peritoneum.  Normal 6 cm uterus.  Somewhat atrophic appearing bilateral adnexa with tumor implants.  Sigmoid and rectal colon and mesentery with moderate peritoneal disease, all lesions less than 5 mm.  Multiple peritoneal implants on bilateral pelvic sidewalls and in the posterior cul-de-sac, all measuring less than 5 mm.  Multiple omental implants measuring several mm.  Approximately 1 cm area within the transverse colon mesentery, unclear if this is related to scar tissue from prior colon resection versus tumor implant.  No obvious pelvic adenopathy, no palpable para-aortic adenopathy. R1 resection at the end of surgery   05/12/2021 Pathology Results   A. UTERUS, CERVIX, BILATERAL FALLOPIAN TUBES AND OVARIES:  - Fallopian  tubes:       - Bilateral fallopian tubes involved by high-grade serous  carcinoma.  - Ovaries:       - Bilateral ovaries involved by high-grade serous carcinoma.  - Uterine cervix:       - Benign transformation zone.       - Negative for squamous intraepithelial lesion and malignancy.  - Endometrium:       - Inactive endometrium.       - Negative for atypical hyperplasia/EIN and malignancy.  - Myometrium:       - Negative for malignancy.  - Uterine serosa:       - Involved by metastatic high-grade serous carcinoma.   B. PERITONEAL IMPLANT, EXCISION:  - Metastatic high-grade serous carcinoma.   C. OMENTUM, RESECTION:  - Metastatic high-grade serous carcinoma, multifocal.   COMMENT:   A. Immunohistochemical studies show tumor cells to be positive for CK7,  Pax-8, and WT-1, and negative for CK20, PR, and CDX-2. These findings  support the above diagnosis. A p53 shows strong and diffuse staining  within neoplastic cells, indicating mutated p53 status.   OVARY or FALLOPIAN TUBE or PRIMARY PERITONEUM: Resection   Procedure: Total hysterectomy and bilateral salpingo-oophorectomy  Specimen Integrity: Intact  Tumor Site: Favor right fallopian tube  Tumor Size: At least 1.7 cm  Histologic Type: High-grade serous carcinoma  Histologic Grade: High-grade  Ovarian Surface Involvement: Present, right and left  Fallopian Tube Surface Involvement:  Present, right and left  Implants (required for advanced stage serous/seromucinous borderline  tumors only): Present: Peritoneal and omental  Other Tissue/ Organ Involvement: Bilateral ovaries, uterine serosa,  omentum, pelvic peritoneum  Largest Extrapelvic Peritoneal Focus: At least 6.7 cm  Peritoneal/Ascitic Fluid Involvement: Not submitted/unknown  Chemotherapy Response Score (CRS): CRS 1 (no definite or minimal  response)  Regional Lymph Nodes: Not applicable (no lymph nodes submitted or found)   Distant Metastasis:       Distant  Site(s) Involved: Not applicable  Pathologic Stage Classification (pTNM, AJCC 8th Edition): ypT3c, pN not  assigned  TNM classifiers: y (posttreatment)  Ancillary Studies: Can be performed upon request  Representative Tumor Block: A4  Comment(s): None    06/30/2021 Tumor Marker   Patient's tumor was tested for the following markers: CA-125. Results of the tumor marker test revealed 72.8.   08/11/2021 Tumor Marker   Patient's tumor was tested for the following markers: Ca-125. Results of the tumor marker test revealed 30.7.   09/08/2021 Tumor Marker   Patient's tumor was tested for the following markers: CA-125. Results of the tumor marker test revealed 21.4.   09/09/2021 Imaging   1. No new or progressive metastatic disease in the chest abdomen or pelvis. 2. Continued interval decrease in omental caking now with minimal residual nodular stranding. 3. Similar to minimally decreased mediastinal adenopathy. 4. Aortic Atherosclerosis (ICD10-I70.0).   10/04/2021 Tumor Marker   Patient's tumor was tested for the following markers: CA-125. Results of the tumor marker test revealed 22.2.   10/08/2021 -  Chemotherapy   She was started on lynparza   10/27/2021 Tumor Marker   Patient's tumor was tested for the following markers: CA-125. Results of the tumor marker test revealed 24.2.   12/06/2021 Tumor Marker   Patient's tumor was tested for the following markers: CA-125. Results of the tumor marker test revealed 25.1.   01/09/2022 Imaging   1. New 0.4 cm fissural nodule of the superior segment left lower lobe, nonspecific although modestly suspicious for pulmonary metastatic disease. Attention on follow-up. 2. No other evidence of new metastatic disease in the chest, abdomen, or pelvis. 3. Minimal residual omental and peritoneal stranding and nodularity, unchanged. 4. No significant change in enlarged right hilar, pretracheal, and right superior mediastinal lymph nodes. 5. Status post  partial right hemicolectomy and reanastomosis. 6. Status post hysterectomy.   Aortic Atherosclerosis (ICD10-I70.0).   01/09/2022 Tumor Marker   Patient's tumor was tested for the following markers: CA-125. Results of the tumor marker test revealed 29.2.   02/13/2022 Tumor Marker   Patient's tumor was tested for the following markers: CA-125. Results of the tumor marker test revealed 39.2.   03/14/2022 Tumor Marker   Patient's tumor was tested for the following markers: CA-125. Results of the tumor marker test revealed 56.6.   03/23/2022 Imaging   1. No definitive findings to suggest metastatic disease to the chest, abdomen or pelvis. 2. Resolution of previously noted perifissural nodule in the superior aspect of the left major fissure. New 3 mm subpleural nodule in the periphery of the left lower lobe, nonspecific, but statistically likely benign. Attention at time of follow-up imaging is recommended to ensure stability or regression. 3. Aortic atherosclerosis. 4. Additional incidental findings, as above.     PHYSICAL EXAMINATION: ECOG PERFORMANCE STATUS: 0 - Asymptomatic  Vitals:   04/11/22 1358  BP: (!) 141/74  Pulse: 61  Resp: 18  Temp: (!) 97.4 F (36.3 C)  SpO2: 100%   Filed Weights  04/11/22 1358  Weight: 159 lb 6.4 oz (72.3 kg)    GENERAL:alert, no distress and comfortable NEURO: alert & oriented x 3 with fluent speech, no focal motor/sensory deficits  LABORATORY DATA:  I have reviewed the data as listed    Component Value Date/Time   NA 139 03/10/2022 0919   K 3.8 03/10/2022 0919   CL 107 03/10/2022 0919   CO2 24 03/10/2022 0919   GLUCOSE 91 03/10/2022 0919   BUN 22 03/10/2022 0919   CREATININE 0.93 03/10/2022 0919   CREATININE 0.68 03/14/2016 1350   CALCIUM 9.6 03/10/2022 0919   PROT 7.3 03/10/2022 0919   ALBUMIN 4.0 03/10/2022 0919   AST 21 03/10/2022 0919   ALT 17 03/10/2022 0919   ALKPHOS 69 03/10/2022 0919   BILITOT 0.7 03/10/2022 0919    GFRNONAA >60 03/10/2022 0919   GFRAA >60 03/19/2016 0455    No results found for: "SPEP", "UPEP"  Lab Results  Component Value Date   WBC 5.4 04/11/2022   NEUTROABS 2.8 04/11/2022   HGB 13.0 04/11/2022   HCT 38.0 04/11/2022   MCV 102.7 (H) 04/11/2022   PLT 267 04/11/2022      Chemistry      Component Value Date/Time   NA 139 03/10/2022 0919   K 3.8 03/10/2022 0919   CL 107 03/10/2022 0919   CO2 24 03/10/2022 0919   BUN 22 03/10/2022 0919   CREATININE 0.93 03/10/2022 0919   CREATININE 0.68 03/14/2016 1350      Component Value Date/Time   CALCIUM 9.6 03/10/2022 0919   ALKPHOS 69 03/10/2022 0919   AST 21 03/10/2022 0919   ALT 17 03/10/2022 0919   BILITOT 0.7 03/10/2022 0919

## 2022-04-11 NOTE — Assessment & Plan Note (Signed)
She tolerated resumption of Olaparib well I recommend her to return again in 1 month I plan to repeat imaging studies again in a few months

## 2022-04-11 NOTE — Assessment & Plan Note (Signed)
She is feeling better We will continue vitamin B12 injection monthly 

## 2022-04-13 LAB — CA 125: Cancer Antigen (CA) 125: 74.7 U/mL — ABNORMAL HIGH (ref 0.0–38.1)

## 2022-04-20 ENCOUNTER — Other Ambulatory Visit: Payer: Self-pay | Admitting: Hematology and Oncology

## 2022-05-11 ENCOUNTER — Inpatient Hospital Stay: Payer: Medicare Other | Admitting: Pharmacist

## 2022-05-11 ENCOUNTER — Inpatient Hospital Stay: Payer: Medicare Other

## 2022-05-11 ENCOUNTER — Inpatient Hospital Stay: Payer: Medicare Other | Attending: Genetic Counselor

## 2022-05-11 VITALS — BP 130/87 | HR 70 | Temp 97.4°F | Resp 18 | Ht 65.0 in | Wt 159.6 lb

## 2022-05-11 DIAGNOSIS — E538 Deficiency of other specified B group vitamins: Secondary | ICD-10-CM

## 2022-05-11 DIAGNOSIS — Z79899 Other long term (current) drug therapy: Secondary | ICD-10-CM | POA: Diagnosis not present

## 2022-05-11 DIAGNOSIS — C5701 Malignant neoplasm of right fallopian tube: Secondary | ICD-10-CM | POA: Diagnosis not present

## 2022-05-11 DIAGNOSIS — C482 Malignant neoplasm of peritoneum, unspecified: Secondary | ICD-10-CM

## 2022-05-11 LAB — CBC WITH DIFFERENTIAL (CANCER CENTER ONLY)
Abs Immature Granulocytes: 0.01 10*3/uL (ref 0.00–0.07)
Basophils Absolute: 0 10*3/uL (ref 0.0–0.1)
Basophils Relative: 1 %
Eosinophils Absolute: 0.1 10*3/uL (ref 0.0–0.5)
Eosinophils Relative: 2 %
HCT: 37.5 % (ref 36.0–46.0)
Hemoglobin: 12.6 g/dL (ref 12.0–15.0)
Immature Granulocytes: 0 %
Lymphocytes Relative: 37 %
Lymphs Abs: 1.8 10*3/uL (ref 0.7–4.0)
MCH: 34.3 pg — ABNORMAL HIGH (ref 26.0–34.0)
MCHC: 33.6 g/dL (ref 30.0–36.0)
MCV: 102.2 fL — ABNORMAL HIGH (ref 80.0–100.0)
Monocytes Absolute: 0.5 10*3/uL (ref 0.1–1.0)
Monocytes Relative: 10 %
Neutro Abs: 2.5 10*3/uL (ref 1.7–7.7)
Neutrophils Relative %: 50 %
Platelet Count: 264 10*3/uL (ref 150–400)
RBC: 3.67 MIL/uL — ABNORMAL LOW (ref 3.87–5.11)
RDW: 13.2 % (ref 11.5–15.5)
WBC Count: 4.8 10*3/uL (ref 4.0–10.5)
nRBC: 0 % (ref 0.0–0.2)

## 2022-05-11 LAB — CMP (CANCER CENTER ONLY)
ALT: 12 U/L (ref 0–44)
AST: 17 U/L (ref 15–41)
Albumin: 4.2 g/dL (ref 3.5–5.0)
Alkaline Phosphatase: 75 U/L (ref 38–126)
Anion gap: 8 (ref 5–15)
BUN: 21 mg/dL (ref 8–23)
CO2: 25 mmol/L (ref 22–32)
Calcium: 10.1 mg/dL (ref 8.9–10.3)
Chloride: 107 mmol/L (ref 98–111)
Creatinine: 0.94 mg/dL (ref 0.44–1.00)
GFR, Estimated: >60 mL/min (ref >60)
Glucose, Bld: 92 mg/dL (ref 70–99)
Potassium: 4.5 mmol/L (ref 3.5–5.1)
Sodium: 140 mmol/L (ref 135–145)
Total Bilirubin: 0.6 mg/dL (ref 0.3–1.2)
Total Protein: 7.6 g/dL (ref 6.5–8.1)

## 2022-05-11 MED ORDER — CYANOCOBALAMIN 1000 MCG/ML IJ SOLN
1000.0000 ug | Freq: Once | INTRAMUSCULAR | Status: AC
  Administered 2022-05-11: 1000 ug via INTRAMUSCULAR
  Filled 2022-05-11: qty 1

## 2022-05-11 NOTE — Progress Notes (Signed)
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 Heath Lark.    Current treatment regimen and start date Olaparib (10/08/21)   Interval History She continues on olaparib 100 mg by mouth every 12 hours on days 1 to 28 of a 28-day cycle. This is being given  monotherapy. Of note, she also receives a monthly B-12 injection that was started on 10/17/21 by Dr. Alvy Bimler. Therapy is planned to continue until disease progression or unacceptable toxicity.  She last saw Dr. Alvy Bimler on 04/11/22 and clinical pharmacy on 01/30/22.  After her last visit with Dr. Alvy Bimler, scans were reviewed from 03/21/22 and olaparib is continuing.  Her B12 level from 04/11/22 was normal and she continues on monthly B12 injections which Ms. Parker feels helps with her fatigue and overall sense of wellbeing.  Ms. Pyeatt states that Dr. Alvy Bimler wants to do restaging scans sometime in March which would be 6 months from her last scans.    Response to Therapy Ms. Romain is doing well.  Her CA125 is not back yet today and will likely result sometime tomorrow and be sent directly to Dr. Alvy Bimler.  Her last value was estimated at 74.7 U/mL and has slowly been increasing.  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  She also exercises regularly by walking her dog and practicing yoga.  She states that she is having Thanksgiving at her home this year which she is looking forward to.  She will have her monthly B12 injection today and again in 4 weeks when she is next due to see Dr. Alvy Bimler with labs.  We will add a B12 injection in 8 weeks and then see her back in 12 weeks with labs and another B12 injection.  She  knows that she can contact Dr. Alvy Bimler is clinic in the interim with any questions or concerns.  Labs, vitals, treatment parameters, and manufacturer guidelines assessing toxicity were reviewed with Calvin L Locicero today. Based on these values, patient is in agreement to continue olaparib therapy at this time.  Allergies Allergies  Allergen Reactions   Barium-Containing Compounds     Legs and arm cramping & caused fall   Hydrocodone Nausea And Vomiting    Can tolerate Oxycodone   Erythromycin     GI upset   Zofran [Ondansetron]     Other reaction(s): Headache-Never wants  again    Vitals    05/11/2022    2:22 PM 04/11/2022    1:58 PM 03/23/2022    2:08 PM  Oncology Vitals  Height 165 cm 165 cm 165 cm  Weight 72.394 kg 72.303 kg 72.666 kg  Weight (lbs) 159 lbs 10 oz 159 lbs 6 oz 160 lbs 3 oz  BMI 26.56 kg/m2   26.56 kg/m2 26.53 kg/m2   26.53 kg/m2 26.66 kg/m2   26.66 kg/m2  Temp 97.4 F (36.3 C) 97.4 F (36.3 C) 97.4 F (36.3 C)  Pulse Rate 70 61 73  BP 130/87 141/74 139/85  Resp '18 18 18  '$ SpO2 99 % 100 % 100 %  BSA (m2) 1.82 m2   1.82 m2 1.82 m2   1.82 m2 1.83 m2   1.83 m2  Laboratory Data    Latest Ref Rng & Units 05/11/2022    1:49 PM 04/11/2022    1:37 PM 03/10/2022    9:19 AM  CBC EXTENDED  WBC 4.0 - 10.5 K/uL 4.8  5.4  3.3   RBC 3.87 - 5.11 MIL/uL 3.67  3.70  3.69   Hemoglobin 12.0 - 15.0 g/dL 12.6  13.0  12.8   HCT 36.0 - 46.0 % 37.5  38.0  38.2   Platelets 150 - 400 K/uL 264  267  258   NEUT# 1.7 - 7.7 K/uL 2.5  2.8  1.3   Lymph# 0.7 - 4.0 K/uL 1.8  2.0  1.5        Latest Ref Rng & Units 05/11/2022    1:49 PM 04/11/2022    1:37 PM 03/10/2022    9:19 AM  CMP  Glucose 70 - 99 mg/dL 92  87  91   BUN 8 - 23 mg/dL '21  17  22   '$ Creatinine 0.44 - 1.00 mg/dL 0.94  0.84  0.93   Sodium 135 - 145 mmol/L 140  137  139   Potassium 3.5 - 5.1 mmol/L 4.5  3.9  3.8   Chloride 98 - 111 mmol/L 107  104  107   CO2 22 - 32 mmol/L '25  26  24   '$ Calcium 8.9 -  10.3 mg/dL 10.1  10.0  9.6   Total Protein 6.5 - 8.1 g/dL 7.6  7.6  7.3   Total Bilirubin 0.3 - 1.2 mg/dL 0.6  0.8  0.7   Alkaline Phos 38 - 126 U/L 75  82  69   AST 15 - 41 U/L '17  15  21   '$ ALT 0 - 44 U/L '12  12  17     '$ No results found for: "MG" No results found for: "CA2729"  Adverse Effects Assessment Fatigue: Stable.  Ms. Caloca continues to be very active and exercise regularly.  She does continue to take zolpidem for sleep which helps.  Adherence Assessment Jacayla L Inch reports missing 0 doses over the past 4 weeks.  Ms. Hohman states that she has missed maybe 6 doses since starting in mid April Reason for missed dose: N/A Patient was re-educated on importance of adherence.   Access Assessment Mashanda L Leveque is currently receiving her olaparib through [employer] concerns: None  Medication Reconciliation [name] medication list was reviewed today with the patient?  Yes New medications or herbal supplements have recently been started?  No Any medications have been discontinued?  No The medication list was updated and reconciled based on the patient's most recent medication list in the electronic medical record (EMR) including herbal products and OTC medications.   Medications Current Outpatient Medications  Medication Sig Dispense Refill   Calcium Carb-Cholecalciferol (CALCIUM 600+D3 PO) Take 1 tablet by mouth in the morning and at bedtime.     cetirizine (ZYRTEC) 10 MG tablet Take 10 mg by mouth every 30 (thirty) days. As needed     cholecalciferol (VITAMIN D3) 25 MCG (1000 UNIT) tablet Take 5,000 Units by mouth in the morning.     diazepam (VALIUM) 5 MG tablet Take 1 tablet (5 mg total) by mouth daily as needed for anxiety. 30 tablet    escitalopram (LEXAPRO) 10 MG tablet TAKE 1 & 1/2 (ONE & ONE-HALF) TABLETS BY MOUTH ONCE DAILY 45 tablet 3   Ginger-Calcium Carbonate (DRAMAMINE GINGER CHEWS PO) Take by mouth.     Homeopathic Products (  T-RELIEF ARNICA  + 12) CREA Apply topically.     levothyroxine (SYNTHROID, LEVOTHROID) 75 MCG tablet Take 75 mcg by mouth daily before breakfast.  3   olaparib (LYNPARZA) 100 MG tablet Take 1 tablet (100 mg total) by mouth 2 (two) times daily. Swallow whole. May take with food to decrease nausea and vomiting. 60 tablet 11   ondansetron (ZOFRAN) 8 MG tablet Take 1 tablet (8 mg total) by mouth every 8 (eight) hours as needed for refractory nausea / vomiting. (Patient not taking: Reported on 12/05/2021) 30 tablet 1   oxyCODONE-acetaminophen (PERCOCET/ROXICET) 5-325 MG tablet Take 1 tablet by mouth every 6 (six) hours as needed for severe pain. 30 tablet 0   Polyethyl Glycol-Propyl Glycol (SYSTANE) 0.4-0.3 % SOLN Place 1-2 drops into both eyes 3 (three) times daily as needed (dry/irritated eyes.).     prochlorperazine (COMPAZINE) 10 MG tablet Take 1 tablet (10 mg total) by mouth every 6 (six) hours as needed (Nausea or vomiting). (Patient not taking: Reported on 12/05/2021) 30 tablet 1   SUNSCREEN SPF50 EX      valACYclovir (VALTREX) 1000 MG tablet Take 1 tablet (1,000 mg total) by mouth daily as needed (Fever Blister).     zolpidem (AMBIEN) 10 MG tablet Take 1 tablet (10 mg total) by mouth at bedtime as needed. for sleep 30 tablet 2   No current facility-administered medications for this visit.    Drug-Drug Interactions (DDIs) DDIs were evaluated?  Yes Significant DDIs?  No The patient was instructed to speak with their health care provider and/or the oral chemotherapy pharmacist before starting any new drug, including prescription or over the counter, natural / herbal products, or vitamins.  Supportive Care Fever: reviewed the importance of having a thermometer and the Centers for Disease Control and Prevention (CDC) definition of fever which is 100.83F (38C) or higher. Patient should call 24/7 triage at [phone] if experiencing a fever or any other  symptoms MDS/AML N/V/D Fatigue Pneumonitis Anemia Headache Neutropenia Arthralgia Avoid grapefruit products and [place] oranges VTE  Dosing Assessment Hepatic adjustments needed?  No Renal adjustments needed?  No Toxicity adjustments needed?  No The current dosing regimen is appropriate to continue at this time.  Follow-Up Plan Continue olaparib 100 mg by mouth every 12 hours Continue vitamin B12 injections monthly.  Will receive today.  Her last B12 level on 04/11/22 was WNL.  Ms. Monceaux does prefer to continue B12 injections at this time as she feels it helps with her fatigue and overall sense of wellbeing Labs, Dr. Alvy Bimler visit, vitamin B12 injection scheduled for 06/13/22 We will add an injection appointment for B12 on 07/11/22 We will add labs, pharmacy clinic visit, vitamin B12 injection for 08/08/22 Restaging scans done on 03/21/22 and will likely be reordered sometime at the end of March  [place] participated in the discussion, expressed understanding, and voiced agreement with the above plan. All questions were answered to her satisfaction. The patient was advised to contact the clinic at [phone] with any questions or concerns prior to her return visit.   I spent 30 minutes assessing and educating the patient.  Raina Mina, RPH-CPP, 05/11/2022  2:52 PM   **Disclaimer: This note was dictated with voice recognition software. Similar sounding words can inadvertently be transcribed and this note may contain transcription errors which may not have been corrected upon publication of note.**

## 2022-05-13 LAB — CA 125: Cancer Antigen (CA) 125: 94.8 U/mL — ABNORMAL HIGH (ref 0.0–38.1)

## 2022-05-15 ENCOUNTER — Telehealth: Payer: Self-pay | Admitting: Pharmacist

## 2022-05-15 NOTE — Telephone Encounter (Signed)
Scheduled appointment per 11/16 los. Patient is aware.  

## 2022-06-13 ENCOUNTER — Inpatient Hospital Stay: Payer: Medicare Other

## 2022-06-13 ENCOUNTER — Inpatient Hospital Stay: Payer: Medicare Other | Attending: Genetic Counselor | Admitting: Hematology and Oncology

## 2022-06-13 ENCOUNTER — Encounter: Payer: Self-pay | Admitting: Hematology and Oncology

## 2022-06-13 ENCOUNTER — Other Ambulatory Visit: Payer: Self-pay

## 2022-06-13 VITALS — BP 143/72 | HR 63 | Resp 18 | Ht 65.0 in | Wt 163.2 lb

## 2022-06-13 DIAGNOSIS — E538 Deficiency of other specified B group vitamins: Secondary | ICD-10-CM | POA: Diagnosis not present

## 2022-06-13 DIAGNOSIS — M2550 Pain in unspecified joint: Secondary | ICD-10-CM

## 2022-06-13 DIAGNOSIS — C5701 Malignant neoplasm of right fallopian tube: Secondary | ICD-10-CM | POA: Diagnosis not present

## 2022-06-13 DIAGNOSIS — C482 Malignant neoplasm of peritoneum, unspecified: Secondary | ICD-10-CM

## 2022-06-13 LAB — CMP (CANCER CENTER ONLY)
ALT: 16 U/L (ref 0–44)
AST: 20 U/L (ref 15–41)
Albumin: 4 g/dL (ref 3.5–5.0)
Alkaline Phosphatase: 75 U/L (ref 38–126)
Anion gap: 9 (ref 5–15)
BUN: 18 mg/dL (ref 8–23)
CO2: 24 mmol/L (ref 22–32)
Calcium: 9.4 mg/dL (ref 8.9–10.3)
Chloride: 106 mmol/L (ref 98–111)
Creatinine: 0.9 mg/dL (ref 0.44–1.00)
GFR, Estimated: >60 mL/min (ref >60)
Glucose, Bld: 90 mg/dL (ref 70–99)
Potassium: 3.9 mmol/L (ref 3.5–5.1)
Sodium: 139 mmol/L (ref 135–145)
Total Bilirubin: 0.6 mg/dL (ref 0.3–1.2)
Total Protein: 7.9 g/dL (ref 6.5–8.1)

## 2022-06-13 LAB — CBC WITH DIFFERENTIAL (CANCER CENTER ONLY)
Abs Immature Granulocytes: 0.01 10*3/uL (ref 0.00–0.07)
Basophils Absolute: 0 10*3/uL (ref 0.0–0.1)
Basophils Relative: 1 %
Eosinophils Absolute: 0.2 10*3/uL (ref 0.0–0.5)
Eosinophils Relative: 6 %
HCT: 35.8 % — ABNORMAL LOW (ref 36.0–46.0)
Hemoglobin: 12 g/dL (ref 12.0–15.0)
Immature Granulocytes: 0 %
Lymphocytes Relative: 35 %
Lymphs Abs: 1.2 10*3/uL (ref 0.7–4.0)
MCH: 34 pg (ref 26.0–34.0)
MCHC: 33.5 g/dL (ref 30.0–36.0)
MCV: 101.4 fL — ABNORMAL HIGH (ref 80.0–100.0)
Monocytes Absolute: 0.4 10*3/uL (ref 0.1–1.0)
Monocytes Relative: 12 %
Neutro Abs: 1.6 10*3/uL — ABNORMAL LOW (ref 1.7–7.7)
Neutrophils Relative %: 46 %
Platelet Count: 314 10*3/uL (ref 150–400)
RBC: 3.53 MIL/uL — ABNORMAL LOW (ref 3.87–5.11)
RDW: 13.7 % (ref 11.5–15.5)
WBC Count: 3.5 10*3/uL — ABNORMAL LOW (ref 4.0–10.5)
nRBC: 0 % (ref 0.0–0.2)

## 2022-06-13 MED ORDER — CYANOCOBALAMIN 1000 MCG/ML IJ SOLN
1000.0000 ug | Freq: Once | INTRAMUSCULAR | Status: AC
  Administered 2022-06-13: 1000 ug via INTRAMUSCULAR
  Filled 2022-06-13: qty 1

## 2022-06-13 NOTE — Assessment & Plan Note (Signed)
She is feeling better We will continue vitamin B12 injection monthly 

## 2022-06-13 NOTE — Progress Notes (Signed)
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[facility]) She tolerated resumption of Olaparib well except for mild intermittent neutropenia, joint pain and nausea I recommend her to return again in 1 month I plan to repeat imaging studies again in March for further follow-up  Joint pain Her diffuse joint pain is not a common feature or side effects of olaparib She can continue conservative approach  Vitamin B12 deficiency She is feeling better We will continue vitamin B12 injection monthly  Orders Placed This Encounter  Procedures   CT ABDOMEN PELVIS W CONTRAST    Standing Status:   Future    Standing Expiration Date:   06/14/2023    Scheduling Instructions:     No oral contrast due to patient reaction to oral contrast    Order Specific Question:   If indicated for the ordered procedure, I authorize the administration of contrast media per Radiology protocol    Answer:   Yes    Order Specific Question:   Preferred imaging location?    Answer:   [facility]    Order Specific Question:   Radiology Contrast Protocol - do NOT remove file path    Answer:   [email or web address]    All questions were answered. The patient knows to call the clinic with any problems, questions or concerns. The total time spent in the appointment was 20 minutes encounter with patients including review of chart and various tests results, discussions about plan of care and coordination of care plan   Heath Lark, MD 06/13/2022 2:42 PM  INTERVAL HISTORY: Please see below for problem oriented charting. she returns for treatment follow-up with her sister, on maintenance olaparib She tolerated treatment  well except for intermittent joint pain and occasional nausea  REVIEW OF SYSTEMS:   Constitutional: Denies fevers, chills or abnormal weight loss Eyes: Denies blurriness of vision Ears, nose, mouth, throat, and face: Denies mucositis or sore throat Respiratory: Denies cough, dyspnea or wheezes Cardiovascular: Denies palpitation, chest discomfort or lower extremity swelling Skin: Denies abnormal skin rashes Lymphatics: Denies new lymphadenopathy or easy bruising Neurological:Denies numbness, tingling or new weaknesses Behavioral/Psych: Mood is stable, no new changes  All other systems were reviewed with the patient and are negative.  I have reviewed the past medical history, past surgical history, social history and family history with the patient and they are unchanged from previous note.  ALLERGIES:  is allergic to barium-containing compounds, hydrocodone, erythromycin, and zofran [ondansetron].  MEDICATIONS:  Current Outpatient Medications  Medication Sig Dispense Refill   Calcium Carb-Cholecalciferol (CALCIUM 600+D3 PO) Take 1 tablet by mouth in the morning and at bedtime.     cetirizine (ZYRTEC) 10 MG tablet Take 10 mg by mouth every 30 (thirty) days. As needed     cholecalciferol (VITAMIN D3) 25 MCG (1000 UNIT) tablet Take 5,000 Units by mouth in the morning.     diazepam (VALIUM) 5 MG tablet Take 1 tablet (5 mg total) by mouth daily as needed for anxiety. 30 tablet    escitalopram (LEXAPRO) 10 MG tablet TAKE 1 & 1/2 (ONE & ONE-HALF) TABLETS BY MOUTH ONCE DAILY 45 tablet 3   Ginger-Calcium Carbonate (DRAMAMINE GINGER CHEWS PO) Take by mouth.     Homeopathic Products (T-RELIEF ARNICA + 12) CREA Apply topically.  levothyroxine (SYNTHROID, LEVOTHROID) 75 MCG tablet Take 75 mcg by mouth daily before breakfast.  3   olaparib (LYNPARZA) 100 MG tablet Take 1 tablet (100 mg total) by mouth 2 (two) times daily. Swallow whole. May take with food to decrease nausea and vomiting. 60 tablet  11   ondansetron (ZOFRAN) 8 MG tablet Take 1 tablet (8 mg total) by mouth every 8 (eight) hours as needed for refractory nausea / vomiting. (Patient not taking: Reported on 12/05/2021) 30 tablet 1   oxyCODONE-acetaminophen (PERCOCET/ROXICET) 5-325 MG tablet Take 1 tablet by mouth every 6 (six) hours as needed for severe pain. 30 tablet 0   Polyethyl Glycol-Propyl Glycol (SYSTANE) 0.4-0.3 % SOLN Place 1-2 drops into both eyes 3 (three) times daily as needed (dry/irritated eyes.).     prochlorperazine (COMPAZINE) 10 MG tablet Take 1 tablet (10 mg total) by mouth every 6 (six) hours as needed (Nausea or vomiting). (Patient not taking: Reported on 12/05/2021) 30 tablet 1   SUNSCREEN SPF50 EX      valACYclovir (VALTREX) 1000 MG tablet Take 1 tablet (1,000 mg total) by mouth daily as needed (Fever Blister).     zolpidem (AMBIEN) 10 MG tablet Take 1 tablet (10 mg total) by mouth at bedtime as needed. for sleep 30 tablet 2   No current facility-administered medications for this visit.    SUMMARY OF ONCOLOGIC HISTORY: Oncology History Overview Note  Normal MMR HRD positive   Cancer of right fallopian tube ([place])  01/05/2021 Pathology Results   SURGICAL PATHOLOGY  CASE: [phone]  PATIENT: Leslie Rivera  Surgical Pathology Report   Reason for Addendum #1:  Immunohistochemistry results  Reason for Addendum #2:  DNA Mismatch Repair IHC Results   Clinical History: history of colon cancer, now with omental thickening  (cm)   FINAL MICROSCOPIC DIAGNOSIS:   A. OMENTUM, NEEDLE CORE BIOPSY:  - Metastatic poorly differentiated adenocarcinoma.  See comment   COMMENT:   Immunohistochemical stains show that the tumor cells are positive for CK7 and negative for CDX2 and CK20.  This immunoprofile is nonspecific and differential diagnosis can include an upper gastrointestinal, breast and lung primary among other possibilities.  Only a very small fraction of primary colonic adenocarcinoma was  immunoprofile.  Clinical and radiologic correlation suggested.   Additional immunohistochemical stains are performed and show that the tumor cells are positive for WT1, PAX8 and ER.  Immunostain for p53 shows a clonal overexpression pattern.  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  Clinical and radiologic correlation is suggested.    01/19/2021 Tumor Marker   Patient's tumor was tested for the following markers: CA-125. Results of the tumor marker test revealed 6790.   01/28/2021 PET scan   1. Examination is positive for extensive FDG avid peritoneal disease within the abdomen and pelvis including multiple implants upon the surface of liver and extensive omental caking. Moderate ascites noted within the pelvis. 2. FDG avid retroperitoneal, retrocrural, right internal mammary, and mediastinal lymph nodes compatible with metastatic adenopathy. 3. Small right pleural effusion. 4.  Aortic Atherosclerosis (ICD10-I70.0).     01/31/2021 Initial Diagnosis   Primary peritoneal carcinomatosis ([place])   01/31/2021 Cancer Staging   Staging form: Ovary, Fallopian Tube, and Primary Peritoneal Carcinoma, AJCC 8th Edition - Clinical stage from 01/31/2021: FIGO Stage IV (cT2b, cN1b, cM1) - Signed by Heath Lark, MD on 01/31/2021 Stage prefix: Initial diagnosis   02/14/2021 Tumor Marker   Patient's tumor was tested for  the following markers: CA-125. Results of the tumor marker test revealed 8668.   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 Remainder of testing was negative/normal. The report date is 02/09/2021.  The CancerNext-Expanded + RNAinsight gene panel offered by [employer] and includes sequencing and rearrangement analysis for the following 77 genes: IP, ALK, APC*,  ATM*, AXIN2, BAP1, BARD1, BLM, BMPR1A, BRCA1*, BRCA2*, BRIP1*, CDC73, CDH1*,CDK4, CDKN1B, CDKN2A, CHEK2*, CTNNA1, DICER1, FANCC, FH, FLCN, GALNT12, KIF1B, LZTR1, MAX, MEN1, MET, MLH1*, MSH2*, MSH3, MSH6*, MUTYH*, NBN, NF1*, NF2, NTHL1, PALB2*, PHOX2B, PMS2*, POT1, PRKAR1A, PTCH1, PTEN*, RAD51C*, RAD51D*,RB1, RECQL, RET, SDHA, SDHAF2, SDHB, SDHC, SDHD, SMAD4, SMARCA4, SMARCB1, SMARCE1, STK11, SUFU, TMEM127, TP53*,TSC1, TSC2, VHL and XRCC2 (sequencing and deletion/duplication); EGFR, EGLN1, HOXB13, KIT, MITF, PDGFRA, POLD1 and POLE (sequencing only); EPCAM and GREM1 (deletion/duplication only).   03/10/2021 Tumor Marker   Patient's tumor was tested for the following markers: CA-125. Results of the tumor marker test revealed 2002.   04/14/2021 Tumor Marker   Patient's tumor was tested for the following markers: CA-125. Results of the tumor marker test revealed 278.   04/26/2021 Imaging   Significant decrease in peritoneal carcinomatosis since previous study, with resolution of ascites.   Decreased anterior mediastinal and retrocrural lymphadenopathy.   No new or progressive metastatic disease within the chest, abdomen, or pelvis.   Aortic Atherosclerosis (ICD10-I70.0).   04/29/2021 Tumor Marker   Patient's tumor was tested for the following markers: CA-125. Results of the tumor marker test revealed 134.   05/12/2021 Surgery   Tumor debulking including robotic-assisted laparoscopic total hysterectomy with bilateral salpingoophorectomy, lysis of adhesions, excision and fulguration of peritoneal nodules, mini laparotomy for intra-abdominal palpation and omentectomy  Findings: : On exam under anesthesia, small mobile uterus.  On intra-abdominal entry, normal upper abdominal survey including liver and stomach.  Right diaphragm with small peritoneal studding, all lesions less than 5 mm.  Omentum with an approximately 5 cm mass adherent to the lower anterior abdominal wall and bladder peritoneum.   Normal 6 cm uterus.  Somewhat atrophic appearing bilateral adnexa with tumor implants.  Sigmoid and rectal colon and mesentery with moderate peritoneal disease, all lesions less than 5 mm.  Multiple peritoneal implants on bilateral pelvic sidewalls and in the posterior cul-de-sac, all measuring less than 5 mm.  Multiple omental implants measuring several mm.  Approximately 1 cm area within the transverse colon mesentery, unclear if this is related to scar tissue from prior colon resection versus tumor implant.  No obvious pelvic adenopathy, no palpable para-aortic adenopathy. R1 resection at the end of surgery   05/12/2021 Pathology Results   A. UTERUS, CERVIX, BILATERAL FALLOPIAN TUBES AND OVARIES:  - Fallopian tubes:       - Bilateral fallopian tubes involved by high-grade serous  carcinoma.  - Ovaries:       - Bilateral ovaries involved by high-grade serous carcinoma.  - Uterine cervix:       - Benign transformation zone.       - Negative for squamous intraepithelial lesion and malignancy.  - Endometrium:       - Inactive endometrium.       - Negative for atypical hyperplasia/EIN and malignancy.  - Myometrium:       - Negative for malignancy.  - Uterine serosa:       - Involved by metastatic  high-grade serous carcinoma.   B. PERITONEAL IMPLANT, EXCISION:  - Metastatic high-grade serous carcinoma.   C. OMENTUM, RESECTION:  - Metastatic high-grade serous carcinoma, multifocal.   COMMENT:   A. Immunohistochemical studies show tumor cells to be positive for CK7,  Pax-8, and WT-1, and negative for CK20, PR, and CDX-2. These findings  support the above diagnosis. A p53 shows strong and diffuse staining  within neoplastic cells, indicating mutated p53 status.   OVARY or FALLOPIAN TUBE or PRIMARY PERITONEUM: Resection   Procedure: Total hysterectomy and bilateral salpingo-oophorectomy  Specimen Integrity: Intact  Tumor Site: Favor right fallopian tube  Tumor Size: At least 1.7 cm   Histologic Type: High-grade serous carcinoma  Histologic Grade: High-grade  Ovarian Surface Involvement: Present, right and left  Fallopian Tube Surface Involvement: Present, right and left  Implants (required for advanced stage serous/seromucinous borderline  tumors only): Present: Peritoneal and omental  Other Tissue/ Organ Involvement: Bilateral ovaries, uterine serosa,  omentum, pelvic peritoneum  Largest Extrapelvic Peritoneal Focus: At least 6.7 cm  Peritoneal/Ascitic Fluid Involvement: Not submitted/unknown  Chemotherapy Response Score (CRS): CRS 1 (no definite or minimal  response)  Regional Lymph Nodes: Not applicable (no lymph nodes submitted or found)   Distant Metastasis:       Distant Site(s) Involved: Not applicable  Pathologic Stage Classification (pTNM, AJCC 8th Edition): ypT3c, pN not  assigned  TNM classifiers: y (posttreatment)  Ancillary Studies: Can be performed upon request  Representative Tumor Block: A4  Comment(s): None    06/30/2021 Tumor Marker   Patient's tumor was tested for the following markers: CA-125. Results of the tumor marker test revealed 72.8.   08/11/2021 Tumor Marker   Patient's tumor was tested for the following markers: Ca-125. Results of the tumor marker test revealed 30.7.   09/08/2021 Tumor Marker   Patient's tumor was tested for the following markers: CA-125. Results of the tumor marker test revealed 21.4.   09/09/2021 Imaging   1. No new or progressive metastatic disease in the chest abdomen or pelvis. 2. Continued interval decrease in omental caking now with minimal residual nodular stranding. 3. Similar to minimally decreased mediastinal adenopathy. 4. Aortic Atherosclerosis (ICD10-I70.0).   10/04/2021 Tumor Marker   Patient's tumor was tested for the following markers: CA-125. Results of the tumor marker test revealed 22.2.   10/08/2021 -  Chemotherapy   She was started on lynparza   10/27/2021 Tumor Marker   Patient's tumor  was tested for the following markers: CA-125. Results of the tumor marker test revealed 24.2.   12/06/2021 Tumor Marker   Patient's tumor was tested for the following markers: CA-125. Results of the tumor marker test revealed 25.1.   01/09/2022 Imaging   1. New 0.4 cm fissural nodule of the superior segment left lower lobe, nonspecific although modestly suspicious for pulmonary metastatic disease. Attention on follow-up. 2. No other evidence of new metastatic disease in the chest, abdomen, or pelvis. 3. Minimal residual omental and peritoneal stranding and nodularity, unchanged. 4. No significant change in enlarged right hilar, pretracheal, and right superior mediastinal lymph nodes. 5. Status post partial right hemicolectomy and reanastomosis. 6. Status post hysterectomy.   Aortic Atherosclerosis (ICD10-I70.0).   01/09/2022 Tumor Marker   Patient's tumor was tested for the following markers: CA-125. Results of the tumor marker test revealed 29.2.   02/13/2022 Tumor Marker   Patient's tumor was tested for the following markers: CA-125. Results of the tumor marker test revealed 39.2.   03/14/2022 Tumor Marker  Patient's tumor was tested for the following markers: CA-125. Results of the tumor marker test revealed 56.6.   03/23/2022 Imaging   1. No definitive findings to suggest metastatic disease to the chest, abdomen or pelvis. 2. Resolution of previously noted perifissural nodule in the superior aspect of the left major fissure. New 3 mm subpleural nodule in the periphery of the left lower lobe, nonspecific, but statistically likely benign. Attention at time of follow-up imaging is recommended to ensure stability or regression. 3. Aortic atherosclerosis. 4. Additional incidental findings, as above.   04/13/2022 Tumor Marker   Patient's tumor was tested for the following markers: CA-125. Results of the tumor marker test revealed 74.7.   05/15/2022 Tumor Marker   Patient's tumor was  tested for the following markers: CA-125. Results of the tumor marker test revealed 98.4.     PHYSICAL EXAMINATION: ECOG PERFORMANCE STATUS: 1 - Symptomatic but completely ambulatory  Vitals:   06/13/22 1028  BP: (!) 143/72  Pulse: 63  Resp: 18  SpO2: 100%   Filed Weights   06/13/22 1028  Weight: 163 lb 3.2 oz (74 kg)    GENERAL:alert, no distress and comfortable  NEURO: alert & oriented x 3 with fluent speech, no focal motor/sensory deficits  LABORATORY DATA:  I have reviewed the data as listed    Component Value Date/Time   NA 139 06/13/2022 1009   K 3.9 06/13/2022 1009   CL 106 06/13/2022 1009   CO2 24 06/13/2022 1009   GLUCOSE 90 06/13/2022 1009   BUN 18 06/13/2022 1009   CREATININE 0.90 06/13/2022 1009   CREATININE 0.68 03/14/2016 1350   CALCIUM 9.4 06/13/2022 1009   PROT 7.9 06/13/2022 1009   ALBUMIN 4.0 06/13/2022 1009   AST 20 06/13/2022 1009   ALT 16 06/13/2022 1009   ALKPHOS 75 06/13/2022 1009   BILITOT 0.6 06/13/2022 1009   GFRNONAA >60 06/13/2022 1009   GFRAA >60 03/19/2016 0455    No results found for: "SPEP", "UPEP"  Lab Results  Component Value Date   WBC 3.5 (L) 06/13/2022   NEUTROABS 1.6 (L) 06/13/2022   HGB 12.0 06/13/2022   HCT 35.8 (L) 06/13/2022   MCV 101.4 (H) 06/13/2022   PLT 314 06/13/2022      Chemistry      Component Value Date/Time   NA 139 06/13/2022 1009   K 3.9 06/13/2022 1009   CL 106 06/13/2022 1009   CO2 24 06/13/2022 1009   BUN 18 06/13/2022 1009   CREATININE 0.90 06/13/2022 1009   CREATININE 0.68 03/14/2016 1350      Component Value Date/Time   CALCIUM 9.4 06/13/2022 1009   ALKPHOS 75 06/13/2022 1009   AST 20 06/13/2022 1009   ALT 16 06/13/2022 1009   BILITOT 0.6 06/13/2022 1009

## 2022-06-13 NOTE — Assessment & Plan Note (Addendum)
She tolerated resumption of Olaparib well except for mild intermittent neutropenia, joint pain and nausea I recommend her to return again in 1 month I plan to repeat imaging studies again in March for further follow-up

## 2022-06-13 NOTE — Assessment & Plan Note (Signed)
Her diffuse joint pain is not a common feature or side effects of olaparib She can continue conservative approach

## 2022-06-13 NOTE — Patient Instructions (Signed)
Vitamin B12 Deficiency Vitamin B12 deficiency occurs when the body does not have enough of this important vitamin. The body needs this vitamin: To make red blood cells. To make DNA. This is the genetic material inside cells. To help the nerves work properly so they can carry messages from the brain to the body. Vitamin B12 deficiency can cause health problems, such as not having enough red blood cells in the blood (anemia). This can lead to nerve damage if untreated. What are the causes? This condition may be caused by: Not eating enough foods that contain vitamin B12. Not having enough stomach acid and digestive fluids to properly absorb vitamin B12 from the food that you eat. Having certain diseases that make it hard to absorb vitamin B12. These diseases include Crohn's disease, chronic pancreatitis, and cystic fibrosis. An autoimmune disorder in which the body does not make enough of a protein (intrinsic factor) within the stomach, resulting in not enough absorption of vitamin B12. Having a surgery in which part of the stomach or small intestine is removed. Taking certain medicines that make it hard for the body to absorb vitamin B12. These include: Heartburn medicines, such as antacids and proton pump inhibitors. Some medicines that are used to treat diabetes. What increases the risk? The following factors may make you more likely to develop a vitamin B12 deficiency: Being an older adult. Eating a vegetarian or vegan diet that does not include any foods that come from animals. Eating a poor diet while you are pregnant. Taking certain medicines. Having alcoholism. What are the signs or symptoms? In some cases, there are no symptoms of this condition. If the condition leads to anemia or nerve damage, various symptoms may occur, such as: Weakness. Tiredness (fatigue). Loss of appetite. Numbness or tingling in your hands and feet. Redness and burning of the tongue. Depression,  confusion, or memory problems. Trouble walking. If anemia is severe, symptoms can include: Shortness of breath. Dizziness. Rapid heart rate. How is this diagnosed? This condition may be diagnosed with a blood test to measure the level of vitamin B12 in your blood. You may also have other tests, including: A group of tests that measure certain characteristics of blood cells (complete blood count, CBC). A blood test to measure intrinsic factor. A procedure where a thin tube with a camera on the end is used to look into your stomach or intestines (endoscopy). Other tests may be needed to discover the cause of the deficiency. How is this treated? Treatment for this condition depends on the cause. This condition may be treated by: Changing your eating and drinking habits, such as: Eating more foods that contain vitamin B12. Drinking less alcohol or no alcohol. Getting vitamin B12 injections. Taking vitamin B12 supplements by mouth (orally). Your health care provider will tell you which dose is best for you. Follow these instructions at home: Eating and drinking  Include foods in your diet that come from animals and contain a lot of vitamin B12. These include: Meats and poultry. This includes beef, pork, chicken, turkey, and organ meats, such as liver. Seafood. This includes clams, rainbow trout, salmon, tuna, and haddock. Eggs. Dairy foods such as milk, yogurt, and cheese. Eat foods that have vitamin B12 added to them (are fortified), such as ready-to-eat breakfast cereals. Check the label on the package to see if a food is fortified. The items listed above may not be a complete list of foods and beverages you can eat and drink. Contact a dietitian for   more information. Alcohol use Do not drink alcohol if: Your health care provider tells you not to drink. You are pregnant, may be pregnant, or are planning to become pregnant. If you drink alcohol: Limit how much you have to: 0-1 drink a  day for women. 0-2 drinks a day for men. Know how much alcohol is in your drink. In the [place] one drink equals one 12 oz bottle of beer (355 mL), one 5 oz glass of wine (148 mL), or one 1 oz glass of hard liquor (44 mL). General instructions Get vitamin B12 injections if told to by your health care provider. Take supplements only as told by your health care provider. Follow the directions carefully. Keep all follow-up visits. This is important. Contact a health care provider if: Your symptoms come back. Your symptoms get worse or do not improve with treatment. Get help right away: You develop shortness of breath. You have a rapid heart rate. You have chest pain. You become dizzy or you faint. These symptoms may be an emergency. Get help right away. Call 911. Do not wait to see if the symptoms will go away. Do not drive yourself to the hospital. Summary Vitamin B12 deficiency occurs when the body does not have enough of this important vitamin. Common causes include not eating enough foods that contain vitamin B12, not being able to absorb vitamin B12 from the food that you eat, having a surgery in which part of the stomach or small intestine is removed, or taking certain medicines. Eat foods that have vitamin B12 in them. Treatment may include making a change in the way you eat and drink, getting vitamin B12 injections, or taking vitamin B12 supplements. This information is not intended to replace advice given to you by your health care provider. Make sure you discuss any questions you have with your health care provider. Document Revised: 02/04/2021 Document Reviewed: 02/04/2021 Elsevier Patient Education  2023 Else[employer] Inc.  

## 2022-06-15 LAB — CA 125: Cancer Antigen (CA) 125: 85.9 U/mL — ABNORMAL HIGH (ref 0.0–38.1)

## 2022-06-27 ENCOUNTER — Other Ambulatory Visit: Payer: Self-pay | Admitting: Hematology and Oncology

## 2022-06-27 DIAGNOSIS — Z1231 Encounter for screening mammogram for malignant neoplasm of breast: Secondary | ICD-10-CM

## 2022-07-04 ENCOUNTER — Encounter: Payer: Self-pay | Admitting: Hematology and Oncology

## 2022-07-11 ENCOUNTER — Inpatient Hospital Stay: Payer: Medicare Other | Attending: Genetic Counselor

## 2022-07-11 DIAGNOSIS — E538 Deficiency of other specified B group vitamins: Secondary | ICD-10-CM

## 2022-07-11 DIAGNOSIS — Z79899 Other long term (current) drug therapy: Secondary | ICD-10-CM | POA: Diagnosis not present

## 2022-07-11 MED ORDER — CYANOCOBALAMIN 1000 MCG/ML IJ SOLN
1000.0000 ug | Freq: Once | INTRAMUSCULAR | Status: AC
  Administered 2022-07-11: 1000 ug via INTRAMUSCULAR
  Filled 2022-07-11: qty 1

## 2022-07-18 DIAGNOSIS — N760 Acute vaginitis: Secondary | ICD-10-CM | POA: Diagnosis not present

## 2022-07-18 DIAGNOSIS — N952 Postmenopausal atrophic vaginitis: Secondary | ICD-10-CM | POA: Diagnosis not present

## 2022-07-26 DIAGNOSIS — E7800 Pure hypercholesterolemia, unspecified: Secondary | ICD-10-CM | POA: Diagnosis not present

## 2022-07-26 DIAGNOSIS — F320 Major depressive disorder, single episode, mild: Secondary | ICD-10-CM | POA: Diagnosis not present

## 2022-07-26 DIAGNOSIS — F5101 Primary insomnia: Secondary | ICD-10-CM | POA: Diagnosis not present

## 2022-08-01 ENCOUNTER — Other Ambulatory Visit: Payer: Self-pay | Admitting: Hematology and Oncology

## 2022-08-01 DIAGNOSIS — L570 Actinic keratosis: Secondary | ICD-10-CM | POA: Diagnosis not present

## 2022-08-01 DIAGNOSIS — L821 Other seborrheic keratosis: Secondary | ICD-10-CM | POA: Diagnosis not present

## 2022-08-01 DIAGNOSIS — L820 Inflamed seborrheic keratosis: Secondary | ICD-10-CM | POA: Diagnosis not present

## 2022-08-01 DIAGNOSIS — L309 Dermatitis, unspecified: Secondary | ICD-10-CM | POA: Diagnosis not present

## 2022-08-08 ENCOUNTER — Inpatient Hospital Stay: Payer: Medicare Other | Attending: Genetic Counselor

## 2022-08-08 ENCOUNTER — Inpatient Hospital Stay: Payer: Medicare Other | Admitting: Pharmacist

## 2022-08-08 ENCOUNTER — Inpatient Hospital Stay: Payer: Medicare Other

## 2022-08-08 VITALS — BP 138/85 | HR 93 | Temp 97.9°F | Resp 16 | Ht 65.0 in | Wt 163.6 lb

## 2022-08-08 DIAGNOSIS — R978 Other abnormal tumor markers: Secondary | ICD-10-CM | POA: Diagnosis not present

## 2022-08-08 DIAGNOSIS — C561 Malignant neoplasm of right ovary: Secondary | ICD-10-CM | POA: Diagnosis not present

## 2022-08-08 DIAGNOSIS — D61818 Other pancytopenia: Secondary | ICD-10-CM | POA: Insufficient documentation

## 2022-08-08 DIAGNOSIS — E538 Deficiency of other specified B group vitamins: Secondary | ICD-10-CM | POA: Insufficient documentation

## 2022-08-08 DIAGNOSIS — I700 Atherosclerosis of aorta: Secondary | ICD-10-CM | POA: Diagnosis not present

## 2022-08-08 DIAGNOSIS — C786 Secondary malignant neoplasm of retroperitoneum and peritoneum: Secondary | ICD-10-CM | POA: Insufficient documentation

## 2022-08-08 DIAGNOSIS — Z79890 Hormone replacement therapy (postmenopausal): Secondary | ICD-10-CM | POA: Diagnosis not present

## 2022-08-08 DIAGNOSIS — C482 Malignant neoplasm of peritoneum, unspecified: Secondary | ICD-10-CM

## 2022-08-08 DIAGNOSIS — Z79899 Other long term (current) drug therapy: Secondary | ICD-10-CM | POA: Diagnosis not present

## 2022-08-08 DIAGNOSIS — C5701 Malignant neoplasm of right fallopian tube: Secondary | ICD-10-CM

## 2022-08-08 LAB — CBC WITH DIFFERENTIAL (CANCER CENTER ONLY)
Abs Immature Granulocytes: 0.01 10*3/uL (ref 0.00–0.07)
Basophils Absolute: 0 10*3/uL (ref 0.0–0.1)
Basophils Relative: 1 %
Eosinophils Absolute: 0.1 10*3/uL (ref 0.0–0.5)
Eosinophils Relative: 1 %
HCT: 38.8 % (ref 36.0–46.0)
Hemoglobin: 13 g/dL (ref 12.0–15.0)
Immature Granulocytes: 0 %
Lymphocytes Relative: 36 %
Lymphs Abs: 1.3 10*3/uL (ref 0.7–4.0)
MCH: 33.7 pg (ref 26.0–34.0)
MCHC: 33.5 g/dL (ref 30.0–36.0)
MCV: 100.5 fL — ABNORMAL HIGH (ref 80.0–100.0)
Monocytes Absolute: 0.3 10*3/uL (ref 0.1–1.0)
Monocytes Relative: 8 %
Neutro Abs: 2 10*3/uL (ref 1.7–7.7)
Neutrophils Relative %: 54 %
Platelet Count: 273 10*3/uL (ref 150–400)
RBC: 3.86 MIL/uL — ABNORMAL LOW (ref 3.87–5.11)
RDW: 14.3 % (ref 11.5–15.5)
WBC Count: 3.7 10*3/uL — ABNORMAL LOW (ref 4.0–10.5)
nRBC: 0 % (ref 0.0–0.2)

## 2022-08-08 LAB — CMP (CANCER CENTER ONLY)
ALT: 14 U/L (ref 0–44)
AST: 17 U/L (ref 15–41)
Albumin: 4 g/dL (ref 3.5–5.0)
Alkaline Phosphatase: 76 U/L (ref 38–126)
Anion gap: 8 (ref 5–15)
BUN: 23 mg/dL (ref 8–23)
CO2: 25 mmol/L (ref 22–32)
Calcium: 9.6 mg/dL (ref 8.9–10.3)
Chloride: 106 mmol/L (ref 98–111)
Creatinine: 0.84 mg/dL (ref 0.44–1.00)
GFR, Estimated: >60 mL/min (ref >60)
Glucose, Bld: 63 mg/dL — ABNORMAL LOW (ref 70–99)
Potassium: 3.7 mmol/L (ref 3.5–5.1)
Sodium: 139 mmol/L (ref 135–145)
Total Bilirubin: 0.5 mg/dL (ref 0.3–1.2)
Total Protein: 7.3 g/dL (ref 6.5–8.1)

## 2022-08-08 LAB — VITAMIN B12: Vitamin B-12: 287 pg/mL (ref 180–914)

## 2022-08-08 MED ORDER — CYANOCOBALAMIN 1000 MCG/ML IJ SOLN
1000.0000 ug | Freq: Once | INTRAMUSCULAR | Status: AC
  Administered 2022-08-08: 1000 ug via INTRAMUSCULAR
  Filled 2022-08-08: qty 1

## 2022-08-08 NOTE — Patient Instructions (Signed)
Vitamin B12 Deficiency Vitamin B12 deficiency occurs when the body does not have enough of this important vitamin. The body needs this vitamin: To make red blood cells. To make DNA. This is the genetic material inside cells. To help the nerves work properly so they can carry messages from the brain to the body. Vitamin B12 deficiency can cause health problems, such as not having enough red blood cells in the blood (anemia). This can lead to nerve damage if untreated. What are the causes? This condition may be caused by: Not eating enough foods that contain vitamin B12. Not having enough stomach acid and digestive fluids to properly absorb vitamin B12 from the food that you eat. Having certain diseases that make it hard to absorb vitamin B12. These diseases include Crohn's disease, chronic pancreatitis, and cystic fibrosis. An autoimmune disorder in which the body does not make enough of a protein (intrinsic factor) within the stomach, resulting in not enough absorption of vitamin B12. Having a surgery in which part of the stomach or small intestine is removed. Taking certain medicines that make it hard for the body to absorb vitamin B12. These include: Heartburn medicines, such as antacids and proton pump inhibitors. Some medicines that are used to treat diabetes. What increases the risk? The following factors may make you more likely to develop a vitamin B12 deficiency: Being an older adult. Eating a vegetarian or vegan diet that does not include any foods that come from animals. Eating a poor diet while you are pregnant. Taking certain medicines. Having alcoholism. What are the signs or symptoms? In some cases, there are no symptoms of this condition. If the condition leads to anemia or nerve damage, various symptoms may occur, such as: Weakness. Tiredness (fatigue). Loss of appetite. Numbness or tingling in your hands and feet. Redness and burning of the tongue. Depression,  confusion, or memory problems. Trouble walking. If anemia is severe, symptoms can include: Shortness of breath. Dizziness. Rapid heart rate. How is this diagnosed? This condition may be diagnosed with a blood test to measure the level of vitamin B12 in your blood. You may also have other tests, including: A group of tests that measure certain characteristics of blood cells (complete blood count, CBC). A blood test to measure intrinsic factor. A procedure where a thin tube with a camera on the end is used to look into your stomach or intestines (endoscopy). Other tests may be needed to discover the cause of the deficiency. How is this treated? Treatment for this condition depends on the cause. This condition may be treated by: Changing your eating and drinking habits, such as: Eating more foods that contain vitamin B12. Drinking less alcohol or no alcohol. Getting vitamin B12 injections. Taking vitamin B12 supplements by mouth (orally). Your health care provider will tell you which dose is best for you. Follow these instructions at home: Eating and drinking  Include foods in your diet that come from animals and contain a lot of vitamin B12. These include: Meats and poultry. This includes beef, pork, chicken, turkey, and organ meats, such as liver. Seafood. This includes clams, rainbow trout, salmon, tuna, and haddock. Eggs. Dairy foods such as milk, yogurt, and cheese. Eat foods that have vitamin B12 added to them (are fortified), such as ready-to-eat breakfast cereals. Check the label on the package to see if a food is fortified. The items listed above may not be a complete list of foods and beverages you can eat and drink. Contact a dietitian for   more information. Alcohol use Do not drink alcohol if: Your health care provider tells you not to drink. You are pregnant, may be pregnant, or are planning to become pregnant. If you drink alcohol: Limit how much you have to: 0-1 drink a  day for women. 0-2 drinks a day for men. Know how much alcohol is in your drink. In the [place] one drink equals one 12 oz bottle of beer (355 mL), one 5 oz glass of wine (148 mL), or one 1 oz glass of hard liquor (44 mL). General instructions Get vitamin B12 injections if told to by your health care provider. Take supplements only as told by your health care provider. Follow the directions carefully. Keep all follow-up visits. This is important. Contact a health care provider if: Your symptoms come back. Your symptoms get worse or do not improve with treatment. Get help right away: You develop shortness of breath. You have a rapid heart rate. You have chest pain. You become dizzy or you faint. These symptoms may be an emergency. Get help right away. Call 911. Do not wait to see if the symptoms will go away. Do not drive yourself to the hospital. Summary Vitamin B12 deficiency occurs when the body does not have enough of this important vitamin. Common causes include not eating enough foods that contain vitamin B12, not being able to absorb vitamin B12 from the food that you eat, having a surgery in which part of the stomach or small intestine is removed, or taking certain medicines. Eat foods that have vitamin B12 in them. Treatment may include making a change in the way you eat and drink, getting vitamin B12 injections, or taking vitamin B12 supplements. This information is not intended to replace advice given to you by your health care provider. Make sure you discuss any questions you have with your health care provider. Document Revised: 02/04/2021 Document Reviewed: 02/04/2021 Elsevier Patient Education  2023 Else[employer] Inc.  

## 2022-08-08 NOTE — Progress Notes (Signed)
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 Heath Lark.    Current treatment regimen and start date Olaparib (10/08/21)   Interval History She continues on olaparib 100 mg by mouth every 12 hours on days 1 to 28 of a 28-day cycle. This is being given  monotherapy. Of note, she also receives a monthly B-12 injection that was started on 10/17/21 by Dr. Alvy Bimler. Therapy is planned to continue until disease progression or unacceptable toxicity.  She last saw Dr. Alvy Bimler on 06/13/22 and clinical pharmacy on 05/11/22. Restaging scans are due in March and Leslie Rivera will see Dr. Alvy Bimler on 09/07/22 with labs when she is next due for her B12 injection after today's administration.  Response to Therapy Leslie Rivera is doing very well. She is having mild nausea which she continues to take ginger chews for with relief. She is having general arthralgias which she takes OTC acetaminophen and ibuprofen for occasionally. She is not reporting any other side effects at this time. We again reviewed signs and symptoms of VTE and pneumonitis. She continues to be very active. Glucose was estimated at 63 today but Leslie Rivera has had normal values historically. She mentioned she has just eaten a blueberry muffin which could have been why the number was low. Labs, vitals, treatment parameters, and manufacturer guidelines assessing toxicity were reviewed with Leslie Rivera today. Based on these values, patient is in agreement to continue olaparib therapy at this time.  Allergies Allergies  Allergen Reactions   Barium-Containing Compounds     Legs and arm cramping & caused fall   Hydrocodone Nausea And Vomiting    Can tolerate  Oxycodone   Erythromycin     GI upset   Zofran [Ondansetron]     Other reaction(s): Headache-Never wants  again    Vitals    08/08/2022   11:07 AM 06/13/2022   10:28 AM 05/11/2022    2:22 PM  Oncology Vitals  Height 165 cm 165 cm 165 cm  Weight 74.208 kg 74.027 kg 72.394 kg  Weight (lbs) 163 lbs 10 oz 163 lbs 3 oz 159 lbs 10 oz  BMI 27.22 kg/m2   27.22 kg/m2 27.16 kg/m2   27.16 kg/m2 26.56 kg/m2   26.56 kg/m2  Temp 97.9 F (36.6 C)  97.4 F (36.3 C)  Pulse Rate 93 63 70  BP 138/85 143/72 130/87  Resp 16 18 18  $ SpO2 99 % 100 % 99 %  BSA (m2) 1.84 m2   1.84 m2 1.84 m2   1.84 m2 1.82 m2   1.82 m2    Laboratory Data    Latest Ref Rng & Units 08/08/2022   10:13 AM 06/13/2022   10:09 AM 05/11/2022    1:49 PM  CBC EXTENDED  WBC 4.0 - 10.5 K/uL 3.7  3.5  4.8   RBC 3.87 - 5.11 MIL/uL 3.86  3.53  3.67   Hemoglobin 12.0 - 15.0 g/dL 13.0  12.0  12.6   HCT 36.0 - 46.0 % 38.8  35.8  37.5   Platelets 150 - 400 K/uL 273  314  264   NEUT# 1.7 - 7.7 K/uL 2.0  1.6  2.5   Lymph# 0.7 - 4.0 K/uL 1.3  1.2  1.8        Latest Ref Rng & Units 08/08/2022   10:13 AM 06/13/2022   10:09 AM 05/11/2022    1:49 PM  CMP  Glucose 70 - 99 mg/dL 63  90  92   BUN 8 - 23 mg/dL 23  18  21   $ Creatinine 0.44 - 1.00 mg/dL 0.84  0.90  0.94   Sodium 135 - 145 mmol/L 139  139  140   Potassium 3.5 - 5.1 mmol/L 3.7  3.9  4.5   Chloride 98 - 111 mmol/L 106  106  107   CO2 22 - 32 mmol/L 25  24  25   $ Calcium 8.9 - 10.3 mg/dL 9.6  9.4  10.1   Total Protein 6.5 - 8.1 g/dL 7.3  7.9  7.6   Total Bilirubin 0.3 - 1.2 mg/dL 0.5  0.6  0.6   Alkaline Phos 38 - 126 U/L 76  75  75   AST 15 - 41 U/L 17  20  17   $ ALT 0 - 44 U/L 14  16  12     $ Adverse Effects Assessment Nausea: mild, using ginger chews with great response. Has anti-nausea prescriptions at home if needed as well  Adherence Assessment Leslie Rivera reports missing 0 doses over the past 8 weeks.   Reason for missed dose: n/a Patient was  re-educated on importance of adherence.   Access Assessment Leslie Rivera is currently receiving her olaparib through  [facility] concerns:  none  Medication Reconciliation [name] medication list was reviewed today with the patient? Yes New medications or herbal supplements have recently been started? No  Any medications have been discontinued? No  The medication list was updated and reconciled based on the patient's most recent medication list in the electronic medical record (EMR) including herbal products and OTC medications.   Medications Current Outpatient Medications  Medication Sig Dispense Refill   Calcium Carb-Cholecalciferol (CALCIUM 600+D3 PO) Take 1 tablet by mouth in the morning and at bedtime.     cetirizine (ZYRTEC) 10 MG tablet Take 10 mg by mouth every 30 (thirty) days. As needed     cholecalciferol (VITAMIN D3) 25 MCG (1000 UNIT) tablet Take 5,000 Units by mouth in the morning.     diazepam (VALIUM) 5 MG tablet Take 1 tablet (5 mg total) by mouth daily as needed for anxiety. 30 tablet    escitalopram (LEXAPRO) 10 MG tablet TAKE 1 & 1/2 (ONE & ONE-HALF) TABLETS BY MOUTH ONCE DAILY 45 tablet 3   Ginger-Calcium Carbonate (DRAMAMINE GINGER CHEWS PO) Take by mouth.     Homeopathic Products (T-RELIEF ARNICA + 12) CREA Apply topically.     levothyroxine (SYNTHROID, LEVOTHROID) 75 MCG tablet Take 75 mcg by mouth daily before breakfast.  3   olaparib (LYNPARZA) 100 MG tablet Take 1 tablet (100 mg total) by mouth 2 (two) times daily. Swallow whole. May take with food to decrease nausea and vomiting. 60 tablet 11   ondansetron (ZOFRAN) 8 MG tablet Take 1 tablet (8 mg total) by mouth every 8 (eight) hours as needed for refractory nausea / vomiting. (Patient not taking: Reported on 12/05/2021) 30 tablet 1   oxyCODONE-acetaminophen (PERCOCET/ROXICET) 5-325 MG tablet Take 1 tablet by mouth every 6 (six) hours as needed for severe pain. 30 tablet 0   Polyethyl  Glycol-Propyl Glycol (SYSTANE) 0.4-0.3 % SOLN Place 1-2 drops into both eyes 3 (three) times daily as needed (dry/irritated eyes.).  prochlorperazine (COMPAZINE) 10 MG tablet Take 1 tablet (10 mg total) by mouth every 6 (six) hours as needed (Nausea or vomiting). (Patient not taking: Reported on 12/05/2021) 30 tablet 1   SUNSCREEN SPF50 EX      valACYclovir (VALTREX) 1000 MG tablet Take 1 tablet (1,000 mg total) by mouth daily as needed (Fever Blister).     zolpidem (AMBIEN) 10 MG tablet Take 1 tablet (10 mg total) by mouth at bedtime as needed. for sleep 30 tablet 2   No current facility-administered medications for this visit.    Drug-Drug Interactions (DDIs) DDIs were evaluated? Yes Significant DDIs? No  The patient was instructed to speak with their health care provider and/or the oral chemotherapy pharmacist before starting any new drug, including prescription or over the counter, natural / herbal products, or vitamins.  Supportive Care Fever: reviewed the importance of having a thermometer and the Centers for Disease Control and Prevention (CDC) definition of fever which is 100.17F (38C) or higher. Patient should call 24/7 triage at [phone] if experiencing a fever or any other symptoms MDS/AML N/V/D Fatigue Pneumonitis Anemia Headache Neutropenia Arthralgia Avoid grapefruit products and [place] oranges VTE  Dosing Assessment Hepatic adjustments needed? No  Renal adjustments needed? No  Toxicity adjustments needed? No  The current dosing regimen is appropriate to continue at this time.  Follow-Up Plan Continue olaparib 100 mg by mouth every 12 hours Continue vitamin B12 injections monthly.  Will receive today and continue every 4 weeks for now.  Labs, Dr. Alvy Bimler visit, vitamin B12 injection scheduled for 09/07/22. Restaging scans and lab on 09/05/22 Leslie Rivera said every 4 month visits are good. We will add lab, pharmacy clinic visit for sometime in June We will  add labs, pharmacy clinic visit, vitamin B12 injection for 08/08/22 Monitor blood glucose level. Has been normal but had just eaten which likely explains lower number today  Leslie Rivera participated in the discussion, expressed understanding, and voiced agreement with the above plan. All questions were answered to her satisfaction. The patient was advised to contact the clinic at [phone] with any questions or concerns prior to her return visit.   I spent 30 minutes assessing and educating the patient.  Leslie Rivera, RPH-CPP, 08/08/2022  11:21 AM   **Disclaimer: This note was dictated with voice recognition software. Similar sounding words can inadvertently be transcribed and this note may contain transcription errors which may not have been corrected upon publication of note.**

## 2022-08-09 ENCOUNTER — Telehealth: Payer: Self-pay | Admitting: Oncology

## 2022-08-09 LAB — CA 125: Cancer Antigen (CA) 125: 134 U/mL — ABNORMAL HIGH (ref 0.0–38.1)

## 2022-08-09 NOTE — Telephone Encounter (Signed)
Called Leslie Rivera and let her know her CA 125 results.  Advised that Dr. Alvy Bimler is recommending that we reschedule her CT to next week.  Discussed the appointment has been rescheduled to 08/16/22 at 1:30 (arrive at 1:00, NPO 4 hours prior).  [name] said she can't drink the oral contrast due to severe cramping in the past.  Checked with Dr. Alvy Bimler and it is ok for IV contrast only.  Also scheduled an appointment on 08/18/22 at 11:00 with Dr. Alvy Bimler to review the results.  Radiah verbalized understanding and agreement of results and appointments.

## 2022-08-10 ENCOUNTER — Telehealth: Payer: Self-pay | Admitting: Pharmacist

## 2022-08-10 NOTE — Telephone Encounter (Signed)
Scheduled appointment per los. Left voicemail.

## 2022-08-16 ENCOUNTER — Encounter (HOSPITAL_BASED_OUTPATIENT_CLINIC_OR_DEPARTMENT_OTHER): Payer: Self-pay

## 2022-08-16 ENCOUNTER — Ambulatory Visit (HOSPITAL_BASED_OUTPATIENT_CLINIC_OR_DEPARTMENT_OTHER)
Admission: RE | Admit: 2022-08-16 | Discharge: 2022-08-16 | Disposition: A | Discharge status: Home / Self Care | Payer: Medicare Other | Source: Ambulatory Visit | Attending: Hematology and Oncology | Admitting: Hematology and Oncology

## 2022-08-16 DIAGNOSIS — C5701 Malignant neoplasm of right fallopian tube: Secondary | ICD-10-CM

## 2022-08-16 DIAGNOSIS — C569 Malignant neoplasm of unspecified ovary: Secondary | ICD-10-CM | POA: Diagnosis not present

## 2022-08-16 MED ORDER — IOHEXOL 300 MG/ML  SOLN
100.0000 mL | Freq: Once | INTRAMUSCULAR | Status: AC | PRN
  Administered 2022-08-16: 75 mL via INTRAVENOUS

## 2022-08-17 ENCOUNTER — Ambulatory Visit: Payer: Medicare Other

## 2022-08-18 ENCOUNTER — Encounter: Payer: Self-pay | Admitting: Hematology and Oncology

## 2022-08-18 ENCOUNTER — Other Ambulatory Visit: Payer: Self-pay

## 2022-08-18 ENCOUNTER — Inpatient Hospital Stay: Payer: Medicare Other | Admitting: Hematology and Oncology

## 2022-08-18 ENCOUNTER — Telehealth: Payer: Self-pay

## 2022-08-18 ENCOUNTER — Other Ambulatory Visit (HOSPITAL_COMMUNITY): Payer: Self-pay

## 2022-08-18 VITALS — BP 125/62 | HR 69 | Temp 97.8°F | Resp 18 | Ht 65.0 in | Wt 163.8 lb

## 2022-08-18 DIAGNOSIS — Z79890 Hormone replacement therapy (postmenopausal): Secondary | ICD-10-CM | POA: Diagnosis not present

## 2022-08-18 DIAGNOSIS — C561 Malignant neoplasm of right ovary: Secondary | ICD-10-CM | POA: Diagnosis not present

## 2022-08-18 DIAGNOSIS — Z79899 Other long term (current) drug therapy: Secondary | ICD-10-CM | POA: Diagnosis not present

## 2022-08-18 DIAGNOSIS — E538 Deficiency of other specified B group vitamins: Secondary | ICD-10-CM

## 2022-08-18 DIAGNOSIS — C5701 Malignant neoplasm of right fallopian tube: Secondary | ICD-10-CM | POA: Diagnosis not present

## 2022-08-18 DIAGNOSIS — D61818 Other pancytopenia: Secondary | ICD-10-CM

## 2022-08-18 DIAGNOSIS — I700 Atherosclerosis of aorta: Secondary | ICD-10-CM | POA: Diagnosis not present

## 2022-08-18 DIAGNOSIS — R978 Other abnormal tumor markers: Secondary | ICD-10-CM | POA: Diagnosis not present

## 2022-08-18 DIAGNOSIS — C482 Malignant neoplasm of peritoneum, unspecified: Secondary | ICD-10-CM

## 2022-08-18 DIAGNOSIS — C786 Secondary malignant neoplasm of retroperitoneum and peritoneum: Secondary | ICD-10-CM | POA: Diagnosis not present

## 2022-08-18 MED ORDER — OXYCODONE-ACETAMINOPHEN 5-325 MG PO TABS
1.0000 | ORAL_TABLET | Freq: Four times a day (QID) | ORAL | 0 refills | Status: DC | PRN
  Filled 2022-08-18: qty 60, 15d supply, fill #0

## 2022-08-18 NOTE — Telephone Encounter (Signed)
-----   Message from Heath Lark, MD sent at 08/18/2022  2:27 PM EST ----- Pls call her I see her PET is scheduled for 3/11 Cancel lab appt and keep everything as is

## 2022-08-18 NOTE — Progress Notes (Signed)
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[facility]) She is not symptomatic However, tumor marker continues to get worse Due to her history of fallopian tube and colon cancer, with enlarged lymph nodes seen on CT imaging, it could be caused by either one of her disease We discussed the role of PET/CT imaging and she is in agreement I will see her back to review test results For now, she will continue Lynparza  Pancytopenia, acquired [facility]) Her pancytopenia is due to her treatment She is not symptomatic Observe only  Vitamin B12 deficiency She is feeling better We will continue vitamin B12 injection monthly  Orders Placed This Encounter  Procedures   NM PET Image Restage (PS) Skull Base to Thigh (F-18 FDG)    Standing Status:   Future    Standing Expiration Date:   08/19/2023    Order Specific Question:   If indicated for the ordered procedure, I authorize the administration of a radiopharmaceutical per Radiology protocol    Answer:   Yes    Order Specific Question:   Preferred imaging location?    Answer:   [facility]    Order Specific Question:   Radiology Contrast Protocol - do NOT remove file path    Answer:   [email or web address]    All questions were answered. The patient knows to call the clinic with any problems, questions or concerns. The total time spent in the appointment was 30 minutes encounter with patients including review of chart and various tests results, discussions about plan of care and coordination of care plan   Heath Lark, MD 08/18/2022 11:53 AM  INTERVAL HISTORY: Please see below for problem oriented charting. she returns for treatment follow-up  with her sister She denies abdominal pain or changes in bowel habits  REVIEW OF SYSTEMS:   Constitutional: Denies fevers, chills or abnormal weight loss Eyes: Denies blurriness of vision Ears, nose, mouth, throat, and face: Denies mucositis or sore throat Respiratory: Denies cough, dyspnea or wheezes Cardiovascular: Denies palpitation, chest discomfort or lower extremity swelling Gastrointestinal:  Denies nausea, heartburn or change in bowel habits Skin: Denies abnormal skin rashes Lymphatics: Denies new lymphadenopathy or easy bruising Neurological:Denies numbness, tingling or new weaknesses Behavioral/Psych: Mood is stable, no new changes  All other systems were reviewed with the patient and are negative.  I have reviewed the past medical history, past surgical history, social history and family history with the patient and they are unchanged from previous note.  ALLERGIES:  is allergic to barium-containing compounds, hydrocodone, erythromycin, and zofran [ondansetron].  MEDICATIONS:  Current Outpatient Medications  Medication Sig Dispense Refill   Calcium Carb-Cholecalciferol (CALCIUM 600+D3 PO) Take 1 tablet by mouth in the morning and at bedtime.     cetirizine (ZYRTEC) 10 MG tablet Take 10 mg by mouth every 30 (thirty) days. As needed     cholecalciferol (VITAMIN D3) 25 MCG (1000 UNIT) tablet Take 5,000 Units by mouth in the morning.     diazepam (VALIUM) 5 MG tablet Take 1 tablet (5 mg total) by mouth daily as needed for anxiety. 30 tablet    escitalopram (LEXAPRO) 10 MG tablet TAKE 1 & 1/2 (ONE & ONE-HALF) TABLETS BY MOUTH ONCE DAILY 45 tablet 3   Ginger-Calcium Carbonate (DRAMAMINE GINGER CHEWS PO) Take by  mouth.     Homeopathic Products (T-RELIEF ARNICA + 12) CREA Apply topically.     levothyroxine (SYNTHROID, LEVOTHROID) 75 MCG tablet Take 75 mcg by mouth daily before breakfast.  3   olaparib (LYNPARZA) 100 MG tablet Take 1 tablet (100 mg total) by mouth 2 (two) times daily.  Swallow whole. May take with food to decrease nausea and vomiting. 60 tablet 11   ondansetron (ZOFRAN) 8 MG tablet Take 1 tablet (8 mg total) by mouth every 8 (eight) hours as needed for refractory nausea / vomiting. (Patient not taking: Reported on 12/05/2021) 30 tablet 1   oxyCODONE-acetaminophen (PERCOCET/ROXICET) 5-325 MG tablet Take 1 tablet by mouth every 6 (six) hours as needed for severe pain. 60 tablet 0   Polyethyl Glycol-Propyl Glycol (SYSTANE) 0.4-0.3 % SOLN Place 1-2 drops into both eyes 3 (three) times daily as needed (dry/irritated eyes.).     prochlorperazine (COMPAZINE) 10 MG tablet Take 1 tablet (10 mg total) by mouth every 6 (six) hours as needed (Nausea or vomiting). (Patient not taking: Reported on 12/05/2021) 30 tablet 1   SUNSCREEN SPF50 EX      valACYclovir (VALTREX) 1000 MG tablet Take 1 tablet (1,000 mg total) by mouth daily as needed (Fever Blister).     zolpidem (AMBIEN) 10 MG tablet Take 1 tablet (10 mg total) by mouth at bedtime as needed. for sleep 30 tablet 2   No current facility-administered medications for this visit.    SUMMARY OF ONCOLOGIC HISTORY: Oncology History Overview Note  Normal MMR HRD positive   Cancer of right fallopian tube ([place])  01/05/2021 Pathology Results   SURGICAL PATHOLOGY  CASE: [phone]  PATIENT: [facility]  Surgical Pathology Report   Reason for Addendum #1:  Immunohistochemistry results  Reason for Addendum #2:  DNA Mismatch Repair IHC Results   Clinical History: history of colon cancer, now with omental thickening  (cm)   FINAL MICROSCOPIC DIAGNOSIS:   A. OMENTUM, NEEDLE CORE BIOPSY:  - Metastatic poorly differentiated adenocarcinoma.  See comment   COMMENT:   Immunohistochemical stains show that the tumor cells are positive for CK7 and negative for CDX2 and CK20.  This immunoprofile is nonspecific and differential diagnosis can include an upper gastrointestinal, breast and lung primary among other possibilities.   Only a very small fraction of primary colonic adenocarcinoma was immunoprofile.  Clinical and radiologic correlation suggested.   Additional immunohistochemical stains are performed and show that the tumor cells are positive for WT1, PAX8 and ER.  Immunostain for p53 shows a clonal overexpression pattern.  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  Clinical and radiologic correlation is suggested.    01/19/2021 Tumor Marker   Patient's tumor was tested for the following markers: CA-125. Results of the tumor marker test revealed 6790.   01/28/2021 PET scan   1. Examination is positive for extensive FDG avid peritoneal disease within the abdomen and pelvis including multiple implants upon the surface of liver and extensive omental caking. Moderate ascites noted within the pelvis. 2. FDG avid retroperitoneal, retrocrural, right internal mammary, and mediastinal lymph nodes compatible with metastatic adenopathy. 3. Small right pleural effusion. 4.  Aortic Atherosclerosis (ICD10-I70.0).     01/31/2021 Initial Diagnosis   Primary peritoneal carcinomatosis ([place])   01/31/2021 Cancer Staging   Staging form: Ovary, Fallopian Tube, and Primary Peritoneal Carcinoma, AJCC 8th Edition - Clinical stage from 01/31/2021: FIGO Stage IV (cT2b, cN1b, cM1) - Signed by Heath Lark, MD  on 01/31/2021 Stage prefix: Initial diagnosis   02/14/2021 Tumor Marker   Patient's tumor was tested for the following markers: CA-125. Results of the tumor marker test revealed 8668.   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 Remainder of testing was negative/normal. The report date is 02/09/2021.  The CancerNext-Expanded + RNAinsight gene panel offered by [employer] and includes sequencing and  rearrangement analysis for the following 77 genes: IP, ALK, APC*, ATM*, AXIN2, BAP1, BARD1, BLM, BMPR1A, BRCA1*, BRCA2*, BRIP1*, CDC73, CDH1*,CDK4, CDKN1B, CDKN2A, CHEK2*, CTNNA1, DICER1, FANCC, FH, FLCN, GALNT12, KIF1B, LZTR1, MAX, MEN1, MET, MLH1*, MSH2*, MSH3, MSH6*, MUTYH*, NBN, NF1*, NF2, NTHL1, PALB2*, PHOX2B, PMS2*, POT1, PRKAR1A, PTCH1, PTEN*, RAD51C*, RAD51D*,RB1, RECQL, RET, SDHA, SDHAF2, SDHB, SDHC, SDHD, SMAD4, SMARCA4, SMARCB1, SMARCE1, STK11, SUFU, TMEM127, TP53*,TSC1, TSC2, VHL and XRCC2 (sequencing and deletion/duplication); EGFR, EGLN1, HOXB13, KIT, MITF, PDGFRA, POLD1 and POLE (sequencing only); EPCAM and GREM1 (deletion/duplication only).   03/10/2021 Tumor Marker   Patient's tumor was tested for the following markers: CA-125. Results of the tumor marker test revealed 2002.   04/14/2021 Tumor Marker   Patient's tumor was tested for the following markers: CA-125. Results of the tumor marker test revealed 278.   04/26/2021 Imaging   Significant decrease in peritoneal carcinomatosis since previous study, with resolution of ascites.   Decreased anterior mediastinal and retrocrural lymphadenopathy.   No new or progressive metastatic disease within the chest, abdomen, or pelvis.   Aortic Atherosclerosis (ICD10-I70.0).   04/29/2021 Tumor Marker   Patient's tumor was tested for the following markers: CA-125. Results of the tumor marker test revealed 134.   05/12/2021 Surgery   Tumor debulking including robotic-assisted laparoscopic total hysterectomy with bilateral salpingoophorectomy, lysis of adhesions, excision and fulguration of peritoneal nodules, mini laparotomy for intra-abdominal palpation and omentectomy  Findings: : On exam under anesthesia, small mobile uterus.  On intra-abdominal entry, normal upper abdominal survey including liver and stomach.  Right diaphragm with small peritoneal studding, all lesions less than 5 mm.  Omentum with an approximately 5 cm mass adherent  to the lower anterior abdominal wall and bladder peritoneum.  Normal 6 cm uterus.  Somewhat atrophic appearing bilateral adnexa with tumor implants.  Sigmoid and rectal colon and mesentery with moderate peritoneal disease, all lesions less than 5 mm.  Multiple peritoneal implants on bilateral pelvic sidewalls and in the posterior cul-de-sac, all measuring less than 5 mm.  Multiple omental implants measuring several mm.  Approximately 1 cm area within the transverse colon mesentery, unclear if this is related to scar tissue from prior colon resection versus tumor implant.  No obvious pelvic adenopathy, no palpable para-aortic adenopathy. R1 resection at the end of surgery   05/12/2021 Pathology Results   A. UTERUS, CERVIX, BILATERAL FALLOPIAN TUBES AND OVARIES:  - Fallopian tubes:       - Bilateral fallopian tubes involved by high-grade serous  carcinoma.  - Ovaries:       - Bilateral ovaries involved by high-grade serous carcinoma.  - Uterine cervix:       - Benign transformation zone.       - Negative for squamous intraepithelial lesion and malignancy.  - Endometrium:       - Inactive endometrium.       - Negative for atypical hyperplasia/EIN and malignancy.  - Myometrium:       -  Negative for malignancy.  - Uterine serosa:       - Involved by metastatic high-grade serous carcinoma.   B. PERITONEAL IMPLANT, EXCISION:  - Metastatic high-grade serous carcinoma.   C. OMENTUM, RESECTION:  - Metastatic high-grade serous carcinoma, multifocal.   COMMENT:   A. Immunohistochemical studies show tumor cells to be positive for CK7,  Pax-8, and WT-1, and negative for CK20, PR, and CDX-2. These findings  support the above diagnosis. A p53 shows strong and diffuse staining  within neoplastic cells, indicating mutated p53 status.   OVARY or FALLOPIAN TUBE or PRIMARY PERITONEUM: Resection   Procedure: Total hysterectomy and bilateral salpingo-oophorectomy  Specimen Integrity: Intact  Tumor  Site: Favor right fallopian tube  Tumor Size: At least 1.7 cm  Histologic Type: High-grade serous carcinoma  Histologic Grade: High-grade  Ovarian Surface Involvement: Present, right and left  Fallopian Tube Surface Involvement: Present, right and left  Implants (required for advanced stage serous/seromucinous borderline  tumors only): Present: Peritoneal and omental  Other Tissue/ Organ Involvement: Bilateral ovaries, uterine serosa,  omentum, pelvic peritoneum  Largest Extrapelvic Peritoneal Focus: At least 6.7 cm  Peritoneal/Ascitic Fluid Involvement: Not submitted/unknown  Chemotherapy Response Score (CRS): CRS 1 (no definite or minimal  response)  Regional Lymph Nodes: Not applicable (no lymph nodes submitted or found)   Distant Metastasis:       Distant Site(s) Involved: Not applicable  Pathologic Stage Classification (pTNM, AJCC 8th Edition): ypT3c, pN not  assigned  TNM classifiers: y (posttreatment)  Ancillary Studies: Can be performed upon request  Representative Tumor Block: A4  Comment(s): None    06/30/2021 Tumor Marker   Patient's tumor was tested for the following markers: CA-125. Results of the tumor marker test revealed 72.8.   08/11/2021 Tumor Marker   Patient's tumor was tested for the following markers: Ca-125. Results of the tumor marker test revealed 30.7.   09/08/2021 Tumor Marker   Patient's tumor was tested for the following markers: CA-125. Results of the tumor marker test revealed 21.4.   09/09/2021 Imaging   1. No new or progressive metastatic disease in the chest abdomen or pelvis. 2. Continued interval decrease in omental caking now with minimal residual nodular stranding. 3. Similar to minimally decreased mediastinal adenopathy. 4. Aortic Atherosclerosis (ICD10-I70.0).   10/04/2021 Tumor Marker   Patient's tumor was tested for the following markers: CA-125. Results of the tumor marker test revealed 22.2.   10/08/2021 -  Chemotherapy   She was  started on lynparza   10/27/2021 Tumor Marker   Patient's tumor was tested for the following markers: CA-125. Results of the tumor marker test revealed 24.2.   12/06/2021 Tumor Marker   Patient's tumor was tested for the following markers: CA-125. Results of the tumor marker test revealed 25.1.   01/09/2022 Imaging   1. New 0.4 cm fissural nodule of the superior segment left lower lobe, nonspecific although modestly suspicious for pulmonary metastatic disease. Attention on follow-up. 2. No other evidence of new metastatic disease in the chest, abdomen, or pelvis. 3. Minimal residual omental and peritoneal stranding and nodularity, unchanged. 4. No significant change in enlarged right hilar, pretracheal, and right superior mediastinal lymph nodes. 5. Status post partial right hemicolectomy and reanastomosis. 6. Status post hysterectomy.   Aortic Atherosclerosis (ICD10-I70.0).   01/09/2022 Tumor Marker   Patient's tumor was tested for the following markers: CA-125. Results of the tumor marker test revealed 29.2.   02/13/2022 Tumor Marker   Patient's tumor was tested for the following  markers: CA-125. Results of the tumor marker test revealed 39.2.   03/14/2022 Tumor Marker   Patient's tumor was tested for the following markers: CA-125. Results of the tumor marker test revealed 56.6.   03/23/2022 Imaging   1. No definitive findings to suggest metastatic disease to the chest, abdomen or pelvis. 2. Resolution of previously noted perifissural nodule in the superior aspect of the left major fissure. New 3 mm subpleural nodule in the periphery of the left lower lobe, nonspecific, but statistically likely benign. Attention at time of follow-up imaging is recommended to ensure stability or regression. 3. Aortic atherosclerosis. 4. Additional incidental findings, as above.   04/13/2022 Tumor Marker   Patient's tumor was tested for the following markers: CA-125. Results of the tumor marker test  revealed 74.7.   05/15/2022 Tumor Marker   Patient's tumor was tested for the following markers: CA-125. Results of the tumor marker test revealed 98.4.   06/15/2022 Tumor Marker   Patient's tumor was tested for the following markers: CA-125. Results of the tumor marker test revealed 85.9.   08/08/2022 Tumor Marker   Patient's tumor was tested for the following markers: CA-125. Results of the tumor marker test revealed 135.   08/17/2022 Imaging   1. 10 x 17 mm soft tissue structure identified in the aortocaval space. While subtle, this does appear to the enlarging and retroperitoneal lymph node is a distinct concern. There is a slight possibility this could be a duodenal diverticulum, but that possibility is considered less likely. Close follow-up recommended. This structure is large enough that PET-CT may provide helpful additional characterization. 2. Right hemicolectomy. 3.  Aortic Atherosclerosis (ICD10-I70.0).     PHYSICAL EXAMINATION: ECOG PERFORMANCE STATUS: 0 - Asymptomatic  Vitals:   08/18/22 1120  BP: 125/62  Pulse: 69  Resp: 18  Temp: 97.8 F (36.6 C)  SpO2: 98%   Filed Weights   08/18/22 1120  Weight: 163 lb 12.8 oz (74.3 kg)    GENERAL:alert, no distress and comfortable  NEURO: alert & oriented x 3 with fluent speech, no focal motor/sensory deficits  LABORATORY DATA:  I have reviewed the data as listed    Component Value Date/Time   NA 139 08/08/2022 1013   K 3.7 08/08/2022 1013   CL 106 08/08/2022 1013   CO2 25 08/08/2022 1013   GLUCOSE 63 (L) 08/08/2022 1013   BUN 23 08/08/2022 1013   CREATININE 0.84 08/08/2022 1013   CREATININE 0.68 03/14/2016 1350   CALCIUM 9.6 08/08/2022 1013   PROT 7.3 08/08/2022 1013   ALBUMIN 4.0 08/08/2022 1013   AST 17 08/08/2022 1013   ALT 14 08/08/2022 1013   ALKPHOS 76 08/08/2022 1013   BILITOT 0.5 08/08/2022 1013   GFRNONAA >60 08/08/2022 1013   GFRAA >60 03/19/2016 0455    No results found for: "SPEP",  "UPEP"  Lab Results  Component Value Date   WBC 3.7 (L) 08/08/2022   NEUTROABS 2.0 08/08/2022   HGB 13.0 08/08/2022   HCT 38.8 08/08/2022   MCV 100.5 (H) 08/08/2022   PLT 273 08/08/2022      Chemistry      Component Value Date/Time   NA 139 08/08/2022 1013   K 3.7 08/08/2022 1013   CL 106 08/08/2022 1013   CO2 25 08/08/2022 1013   BUN 23 08/08/2022 1013   CREATININE 0.84 08/08/2022 1013   CREATININE 0.68 03/14/2016 1350      Component Value Date/Time   CALCIUM 9.6 08/08/2022 1013   ALKPHOS 76  08/08/2022 1013   AST 17 08/08/2022 1013   ALT 14 08/08/2022 1013   BILITOT 0.5 08/08/2022 1013       RADIOGRAPHIC STUDIES: We reviewed CT imaging I have personally reviewed the radiological images as listed and agreed with the findings in the report. CT ABDOMEN PELVIS W CONTRAST  Result Date: 08/17/2022 CLINICAL DATA:  Ovarian cancer restaging. * Tracking Code: BO *. Personal history of colon and gastric cancer. Status post hysterectomy. EXAM: CT ABDOMEN AND PELVIS WITH CONTRAST TECHNIQUE: Multidetector CT imaging of the abdomen and pelvis was performed using the standard protocol following bolus administration of intravenous contrast. RADIATION DOSE REDUCTION: This exam was performed according to the departmental dose-optimization program which includes automated exposure control, adjustment of the mA and/or kV according to patient size and/or use of iterative reconstruction technique. CONTRAST:  31m OMNIPAQUE IOHEXOL 300 MG/ML  SOLN COMPARISON:  03/21/2022 FINDINGS: Lower chest: Unremarkable. Hepatobiliary: No suspicious focal abnormality within the liver parenchyma. There is no evidence for gallstones, gallbladder wall thickening, or pericholecystic fluid. No intrahepatic or extrahepatic biliary dilation. Pancreas: No focal mass lesion. No dilatation of the main duct. No intraparenchymal cyst. No peripancreatic edema. Spleen: No splenomegaly. No focal mass lesion. Adrenals/Urinary  Tract: No adrenal nodule or mass. Kidneys unremarkable. No evidence for hydroureter. The urinary bladder appears normal for the degree of distention. Stomach/Bowel: Stomach is unremarkable. No gastric wall thickening. No evidence of outlet obstruction. Duodenum is normally positioned as is the ligament of Treitz. Duodenal diverticulum noted. No small bowel wall thickening. No small bowel dilatation. Focal high density noted in a pelvic small bowel loops, presumably ingested material. Right hemicolectomy. No gross colonic mass. No colonic wall thickening. Vascular/Lymphatic: There is mild atherosclerotic calcification of the abdominal aorta without aneurysm. There is no gastrohepatic or hepatoduodenal ligament lymphadenopathy. 10 x 17 mm soft tissue structure identified in the aortocaval space on image 36/2. This was about 9 x 13 mm previously and 12 x 8 mm on 01/06/2022. While likely a lymph node, this is a very subtle finding given the location and there is an outside possibility that this could be a distal duodenal diverticulum. No pelvic sidewall lymphadenopathy. Reproductive: Hysterectomy.  There is no adnexal mass. Other: No intraperitoneal free fluid. Musculoskeletal: No worrisome lytic or sclerotic osseous abnormality. IMPRESSION: 1. 10 x 17 mm soft tissue structure identified in the aortocaval space. While subtle, this does appear to the enlarging and retroperitoneal lymph node is a distinct concern. There is a slight possibility this could be a duodenal diverticulum, but that possibility is considered less likely. Close follow-up recommended. This structure is large enough that PET-CT may provide helpful additional characterization. 2. Right hemicolectomy. 3.  Aortic Atherosclerosis (ICD10-I70.0). Electronically Signed   By: EMisty StanleyM.D.   On: 08/17/2022 09:52

## 2022-08-18 NOTE — Telephone Encounter (Signed)
Called and given below message. Lab appt canceled on 3/14. She verbalized undestanding and appreciated the call.

## 2022-08-18 NOTE — Assessment & Plan Note (Signed)
She is feeling better We will continue vitamin B12 injection monthly

## 2022-08-18 NOTE — Assessment & Plan Note (Signed)
She is not symptomatic However, tumor marker continues to get worse Due to her history of fallopian tube and colon cancer, with enlarged lymph nodes seen on CT imaging, it could be caused by either one of her disease We discussed the role of PET/CT imaging and she is in agreement I will see her back to review test results For now, she will continue [place]

## 2022-08-18 NOTE — Assessment & Plan Note (Signed)
Her pancytopenia is due to her treatment She is not symptomatic Observe only

## 2022-08-22 DIAGNOSIS — K080 Exfoliation of teeth due to systemic causes: Secondary | ICD-10-CM | POA: Diagnosis not present

## 2022-08-25 ENCOUNTER — Other Ambulatory Visit: Payer: Self-pay | Admitting: Hematology and Oncology

## 2022-09-04 ENCOUNTER — Encounter (HOSPITAL_COMMUNITY)
Admission: RE | Admit: 2022-09-04 | Discharge: 2022-09-04 | Disposition: A | Discharge status: Home / Self Care | Payer: Medicare Other | Source: Ambulatory Visit | Attending: Hematology and Oncology | Admitting: Hematology and Oncology

## 2022-09-04 DIAGNOSIS — C482 Malignant neoplasm of peritoneum, unspecified: Secondary | ICD-10-CM

## 2022-09-04 DIAGNOSIS — C5701 Malignant neoplasm of right fallopian tube: Secondary | ICD-10-CM | POA: Diagnosis not present

## 2022-09-04 DIAGNOSIS — C569 Malignant neoplasm of unspecified ovary: Secondary | ICD-10-CM | POA: Diagnosis not present

## 2022-09-04 LAB — GLUCOSE, CAPILLARY: Glucose-Capillary: 97 mg/dL (ref 70–99)

## 2022-09-04 MED ORDER — FLUDEOXYGLUCOSE F - 18 (FDG) INJECTION
8.0000 | Freq: Once | INTRAVENOUS | Status: AC | PRN
  Administered 2022-09-04: 8.06 via INTRAVENOUS

## 2022-09-05 ENCOUNTER — Inpatient Hospital Stay: Payer: Medicare Other

## 2022-09-05 ENCOUNTER — Other Ambulatory Visit (HOSPITAL_COMMUNITY): Payer: Medicare Other

## 2022-09-07 ENCOUNTER — Inpatient Hospital Stay: Payer: Medicare Other | Attending: Genetic Counselor

## 2022-09-07 ENCOUNTER — Encounter: Payer: Self-pay | Admitting: Hematology and Oncology

## 2022-09-07 ENCOUNTER — Inpatient Hospital Stay (HOSPITAL_BASED_OUTPATIENT_CLINIC_OR_DEPARTMENT_OTHER): Payer: Medicare Other | Admitting: Hematology and Oncology

## 2022-09-07 VITALS — BP 103/58 | HR 78 | Temp 97.5°F | Resp 18 | Ht 65.0 in | Wt 164.0 lb

## 2022-09-07 DIAGNOSIS — E538 Deficiency of other specified B group vitamins: Secondary | ICD-10-CM | POA: Diagnosis not present

## 2022-09-07 DIAGNOSIS — C5701 Malignant neoplasm of right fallopian tube: Secondary | ICD-10-CM

## 2022-09-07 MED ORDER — CYANOCOBALAMIN 1000 MCG/ML IJ SOLN
1000.0000 ug | Freq: Once | INTRAMUSCULAR | Status: AC
  Administered 2022-09-07: 1000 ug via INTRAMUSCULAR
  Filled 2022-09-07: qty 1

## 2022-09-07 NOTE — Assessment & Plan Note (Signed)
We reviewed PET CT imaging in comparison with prior imaging PET CT imaging showed disease progression I instructed her to stop olaparib  We discussed the role of biopsy We discussed treatment options in according to NCCN guidelines; including carboplatin/Doxil/Avastin versus carboplatin/gemzar/Avastin We discussed risks and benefits of each option  Ultimately she declined biopsy She would like to be treated with single agent carboplatin only We will resume at prior similar dosing We discussed the role of chemotherapy. The intent is of curative intent.  We discussed some of the risks, benefits, side-effects of carboplatin  Some of the short term side-effects included, though not limited to, including weight loss, life threatening infections, risk of allergic reactions, need for transfusions of blood products, nausea, vomiting, change in bowel habits, loss of hair, admission to hospital for various reasons, and risks of death.   Long term side-effects are also discussed including risks of infertility, permanent damage to nerve function, hearing loss, chronic fatigue, kidney damage with possibility needing hemodialysis, and rare secondary malignancy including bone marrow disorders.  The patient is aware that the response rates discussed earlier is not guaranteed.  After a long discussion, patient made an informed decision to proceed with the prescribed plan of care.   Patient education material was dispensed. I will add additional premed I recommend 3 cycles before repeating imaging

## 2022-09-07 NOTE — Progress Notes (Signed)
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 The intent is of curative intent.  We discussed some of the risks, benefits, side-effects of carboplatin  Some of the short term side-effects included, though not limited to, including weight loss, life threatening infections, risk of allergic reactions, need for transfusions of blood products, nausea, vomiting, change in bowel habits, loss of hair, admission to hospital for various reasons, and risks of death.   Long term side-effects are also discussed including risks of infertility, permanent damage to nerve function, hearing loss, chronic fatigue, kidney damage with possibility needing hemodialysis, and rare secondary malignancy including bone marrow disorders.  The patient is aware that the response rates discussed earlier is not guaranteed.  After a long discussion, patient made an informed decision to proceed with the prescribed plan of care.   Patient education material was dispensed. I will add additional premed I recommend 3 cycles before repeating imaging    Vitamin B12 deficiency She is feeling better We will continue vitamin B12 injection  monthly   Orders Placed This Encounter  Procedures   CBC with Differential ([place] Only)    Standing Status:   Future    Standing Expiration Date:   10/05/2023   CMP ([place] only)    Standing Status:   Future    Standing Expiration Date:   10/05/2023   CBC with Differential ([place] Only)    Standing Status:   Future    Standing Expiration Date:   10/26/2023   CMP ([place] only)    Standing Status:   Future    Standing Expiration Date:   10/26/2023   CBC with Differential ([place] Only)    Standing Status:   Future    Standing Expiration Date:   11/16/2023   CMP ([place] only)    Standing Status:   Future    Standing Expiration Date:   11/16/2023   CBC with Differential ([place] Only)    Standing Status:   Future    Standing Expiration Date:   12/07/2023   CMP ([employer] only)    Standing Status:   Future    Standing Expiration Date:   12/07/2023   CBC with Differential ([place] Only)    Standing Status:   Future    Standing Expiration Date:   12/28/2023   CMP ([place] only)    Standing Status:   Future    Standing Expiration Date:   12/28/2023   CBC with Differential ([place] Only)    Standing Status:   Future    Standing Expiration Date:   01/18/2024   CMP ([place] only)    Standing Status:   Future    Standing Expiration Date:   01/18/2024    All questions were answered. The patient knows to  call the clinic with any problems, questions or concerns. The total time spent in the appointment was 40 minutes encounter with patients including review of chart and various tests results, discussions about plan of care and coordination of care plan   Heath Lark, MD 09/07/2022 4:43 PM  INTERVAL HISTORY: Please see below for problem oriented charting. she returns for treatment follow-up and review of results with her sister We discussed treatment options extensively  REVIEW OF SYSTEMS:   Constitutional: Denies fevers,  chills or abnormal weight loss Eyes: Denies blurriness of vision Ears, nose, mouth, throat, and face: Denies mucositis or sore throat Respiratory: Denies cough, dyspnea or wheezes Cardiovascular: Denies palpitation, chest discomfort or lower extremity swelling Gastrointestinal:  Denies nausea, heartburn or change in bowel habits Skin: Denies abnormal skin rashes Lymphatics: Denies new lymphadenopathy or easy bruising Neurological:Denies numbness, tingling or new weaknesses Behavioral/Psych: Mood is stable, no new changes  All other systems were reviewed with the patient and are negative.  I have reviewed the past medical history, past surgical history, social history and family history with the patient and they are unchanged from previous note.  ALLERGIES:  is allergic to barium-containing compounds, hydrocodone, erythromycin, and zofran [ondansetron].  MEDICATIONS:  Current Outpatient Medications  Medication Sig Dispense Refill   Calcium Carb-Cholecalciferol (CALCIUM 600+D3 PO) Take 1 tablet by mouth in the morning and at bedtime.     cetirizine (ZYRTEC) 10 MG tablet Take 10 mg by mouth every 30 (thirty) days. As needed     cholecalciferol (VITAMIN D3) 25 MCG (1000 UNIT) tablet Take 5,000 Units by mouth in the morning.     diazepam (VALIUM) 5 MG tablet Take 1 tablet (5 mg total) by mouth daily as needed for anxiety. 30 tablet    escitalopram (LEXAPRO) 10 MG tablet TAKE 1 & 1/2 (ONE & ONE-HALF) TABLETS BY MOUTH ONCE DAILY 45 tablet 0   Ginger-Calcium Carbonate (DRAMAMINE GINGER CHEWS PO) Take by mouth.     Homeopathic Products (T-RELIEF ARNICA + 12) CREA Apply topically.     levothyroxine (SYNTHROID, LEVOTHROID) 75 MCG tablet Take 75 mcg by mouth daily before breakfast.  3   ondansetron (ZOFRAN) 8 MG tablet Take 1 tablet (8 mg total) by mouth every 8 (eight) hours as needed for refractory nausea / vomiting. (Patient not taking: Reported on 12/05/2021) 30 tablet 1   oxyCODONE-acetaminophen  (PERCOCET/ROXICET) 5-325 MG tablet Take 1 tablet by mouth every 6 (six) hours as needed for severe pain. 60 tablet 0   Polyethyl Glycol-Propyl Glycol (SYSTANE) 0.4-0.3 % SOLN Place 1-2 drops into both eyes 3 (three) times daily as needed (dry/irritated eyes.).     prochlorperazine (COMPAZINE) 10 MG tablet Take 1 tablet (10 mg total) by mouth every 6 (six) hours as needed (Nausea or vomiting). (Patient not taking: Reported on 12/05/2021) 30 tablet 1   SUNSCREEN SPF50 EX      valACYclovir (VALTREX) 1000 MG tablet Take 1 tablet (1,000 mg total) by mouth daily as needed (Fever Blister).     zolpidem (AMBIEN) 10 MG tablet Take 1 tablet (10 mg total) by mouth at bedtime as needed. for sleep 30 tablet 2   No current facility-administered medications for this visit.    SUMMARY OF ONCOLOGIC HISTORY: Oncology History Overview Note  Normal MMR HRD positive   Cancer of right fallopian tube ([place])  01/05/2021 Pathology Results   SURGICAL PATHOLOGY  CASE: [id number]  PATIENT: [facility]  Surgical Pathology Report   Reason for Addendum #1:  Immunohistochemistry results  Reason for Addendum #2:  DNA Mismatch Repair IHC Results   Clinical History: history of colon cancer, now with omental thickening  (cm)   FINAL MICROSCOPIC DIAGNOSIS:   A. OMENTUM, NEEDLE CORE BIOPSY:  - Metastatic poorly differentiated adenocarcinoma.  See comment   COMMENT:   Immunohistochemical stains show that the tumor cells are positive for CK7 and negative for CDX2 and CK20.  This immunoprofile is nonspecific and differential diagnosis can include an upper gastrointestinal, breast and lung primary among other possibilities.  Only a very small fraction of primary colonic adenocarcinoma was immunoprofile.  Clinical and radiologic correlation suggested.   Additional immunohistochemical stains are performed and show that the tumor cells are positive for WT1, PAX8 and ER.  Immunostain for p53 shows a clonal overexpression  pattern.  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  Clinical and radiologic correlation is suggested.    01/19/2021 Tumor Marker   Patient's tumor was tested for the following markers: CA-125. Results of the tumor marker test revealed 6790.   01/28/2021 PET scan   1. Examination is positive for extensive FDG avid peritoneal disease within the abdomen and pelvis including multiple implants upon the surface of liver and extensive omental caking. Moderate ascites noted within the pelvis. 2. FDG avid retroperitoneal, retrocrural, right internal mammary, and mediastinal lymph nodes compatible with metastatic adenopathy. 3. Small right pleural effusion. 4.  Aortic Atherosclerosis (ICD10-I70.0).     01/31/2021 Initial Diagnosis   Primary peritoneal carcinomatosis ([place])   01/31/2021 Cancer Staging   Staging form: Ovary, Fallopian Tube, and Primary Peritoneal Carcinoma, AJCC 8th Edition - Clinical stage from 01/31/2021: FIGO Stage IV (cT2b, cN1b, cM1) - Signed by Heath Lark, MD on 01/31/2021 Stage prefix: Initial diagnosis   02/14/2021 Tumor Marker   Patient's tumor was tested for the following markers: CA-125. Results of the tumor marker test revealed 8668.   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 Remainder of testing was negative/normal. The report date is 02/09/2021.  The CancerNext-Expanded + RNAinsight gene panel offered by [employer] and includes sequencing and rearrangement analysis for the following 77 genes: IP, ALK, APC*, ATM*, AXIN2, BAP1, BARD1, BLM, BMPR1A, BRCA1*, BRCA2*, BRIP1*, CDC73, CDH1*,CDK4, CDKN1B, CDKN2A, CHEK2*, CTNNA1, DICER1, FANCC, FH, FLCN, GALNT12, KIF1B, LZTR1, MAX, MEN1, MET, MLH1*, MSH2*, MSH3, MSH6*, MUTYH*, NBN, NF1*, NF2,  NTHL1, PALB2*, PHOX2B, PMS2*, POT1, PRKAR1A, PTCH1, PTEN*, RAD51C*, RAD51D*,RB1, RECQL, RET, SDHA, SDHAF2, SDHB, SDHC, SDHD, SMAD4, SMARCA4, SMARCB1, SMARCE1, STK11, SUFU, TMEM127, TP53*,TSC1, TSC2, VHL and XRCC2 (sequencing and deletion/duplication); EGFR, EGLN1, HOXB13, KIT, MITF, PDGFRA, POLD1 and POLE (sequencing only); EPCAM and GREM1 (deletion/duplication only).   03/10/2021 Tumor Marker   Patient's tumor was tested for the following markers: CA-125. Results of the tumor marker test revealed 2002.   04/14/2021 Tumor Marker   Patient's tumor was tested for the following markers: CA-125. Results of the tumor marker test revealed 278.   04/26/2021 Imaging   Significant decrease in peritoneal carcinomatosis since previous study, with resolution of ascites.   Decreased anterior mediastinal and retrocrural lymphadenopathy.   No new or progressive metastatic disease within the chest, abdomen, or pelvis.   Aortic Atherosclerosis (ICD10-I70.0).   04/29/2021 Tumor Marker   Patient's tumor was tested for the following markers: CA-125. Results of the tumor marker test revealed  134.   05/12/2021 Surgery   Tumor debulking including robotic-assisted laparoscopic total hysterectomy with bilateral salpingoophorectomy, lysis of adhesions, excision and fulguration of peritoneal nodules, mini laparotomy for intra-abdominal palpation and omentectomy  Findings: : On exam under anesthesia, small mobile uterus.  On intra-abdominal entry, normal upper abdominal survey including liver and stomach.  Right diaphragm with small peritoneal studding, all lesions less than 5 mm.  Omentum with an approximately 5 cm mass adherent to the lower anterior abdominal wall and bladder peritoneum.  Normal 6 cm uterus.  Somewhat atrophic appearing bilateral adnexa with tumor implants.  Sigmoid and rectal colon and mesentery with moderate peritoneal disease, all lesions less than 5 mm.  Multiple peritoneal implants on bilateral  pelvic sidewalls and in the posterior cul-de-sac, all measuring less than 5 mm.  Multiple omental implants measuring several mm.  Approximately 1 cm area within the transverse colon mesentery, unclear if this is related to scar tissue from prior colon resection versus tumor implant.  No obvious pelvic adenopathy, no palpable para-aortic adenopathy. R1 resection at the end of surgery   05/12/2021 Pathology Results   A. UTERUS, CERVIX, BILATERAL FALLOPIAN TUBES AND OVARIES:  - Fallopian tubes:       - Bilateral fallopian tubes involved by high-grade serous  carcinoma.  - Ovaries:       - Bilateral ovaries involved by high-grade serous carcinoma.  - Uterine cervix:       - Benign transformation zone.       - Negative for squamous intraepithelial lesion and malignancy.  - Endometrium:       - Inactive endometrium.       - Negative for atypical hyperplasia/EIN and malignancy.  - Myometrium:       - Negative for malignancy.  - Uterine serosa:       - Involved by metastatic high-grade serous carcinoma.   B. PERITONEAL IMPLANT, EXCISION:  - Metastatic high-grade serous carcinoma.   C. OMENTUM, RESECTION:  - Metastatic high-grade serous carcinoma, multifocal.   COMMENT:   A. Immunohistochemical studies show tumor cells to be positive for CK7,  Pax-8, and WT-1, and negative for CK20, PR, and CDX-2. These findings  support the above diagnosis. A p53 shows strong and diffuse staining  within neoplastic cells, indicating mutated p53 status.   OVARY or FALLOPIAN TUBE or PRIMARY PERITONEUM: Resection   Procedure: Total hysterectomy and bilateral salpingo-oophorectomy  Specimen Integrity: Intact  Tumor Site: Favor right fallopian tube  Tumor Size: At least 1.7 cm  Histologic Type: High-grade serous carcinoma  Histologic Grade: High-grade  Ovarian Surface Involvement: Present, right and left  Fallopian Tube Surface Involvement: Present, right and left  Implants (required for advanced stage  serous/seromucinous borderline  tumors only): Present: Peritoneal and omental  Other Tissue/ Organ Involvement: Bilateral ovaries, uterine serosa,  omentum, pelvic peritoneum  Largest Extrapelvic Peritoneal Focus: At least 6.7 cm  Peritoneal/Ascitic Fluid Involvement: Not submitted/unknown  Chemotherapy Response Score (CRS): CRS 1 (no definite or minimal  response)  Regional Lymph Nodes: Not applicable (no lymph nodes submitted or found)   Distant Metastasis:       Distant Site(s) Involved: Not applicable  Pathologic Stage Classification (pTNM, AJCC 8th Edition): ypT3c, pN not  assigned  TNM classifiers: y (posttreatment)  Ancillary Studies: Can be performed upon request  Representative Tumor Block: A4  Comment(s): None    06/30/2021 Tumor Marker   Patient's tumor was tested for the following markers: CA-125. Results of the tumor marker test revealed 72.8.  08/11/2021 Tumor Marker   Patient's tumor was tested for the following markers: Ca-125. Results of the tumor marker test revealed 30.7.   09/08/2021 Tumor Marker   Patient's tumor was tested for the following markers: CA-125. Results of the tumor marker test revealed 21.4.   09/09/2021 Imaging   1. No new or progressive metastatic disease in the chest abdomen or pelvis. 2. Continued interval decrease in omental caking now with minimal residual nodular stranding. 3. Similar to minimally decreased mediastinal adenopathy. 4. Aortic Atherosclerosis (ICD10-I70.0).   10/04/2021 Tumor Marker   Patient's tumor was tested for the following markers: CA-125. Results of the tumor marker test revealed 22.2.   10/08/2021 -  Chemotherapy   She was started on lynparza   10/27/2021 Tumor Marker   Patient's tumor was tested for the following markers: CA-125. Results of the tumor marker test revealed 24.2.   12/06/2021 Tumor Marker   Patient's tumor was tested for the following markers: CA-125. Results of the tumor marker test revealed  25.1.   01/09/2022 Imaging   1. New 0.4 cm fissural nodule of the superior segment left lower lobe, nonspecific although modestly suspicious for pulmonary metastatic disease. Attention on follow-up. 2. No other evidence of new metastatic disease in the chest, abdomen, or pelvis. 3. Minimal residual omental and peritoneal stranding and nodularity, unchanged. 4. No significant change in enlarged right hilar, pretracheal, and right superior mediastinal lymph nodes. 5. Status post partial right hemicolectomy and reanastomosis. 6. Status post hysterectomy.   Aortic Atherosclerosis (ICD10-I70.0).   01/09/2022 Tumor Marker   Patient's tumor was tested for the following markers: CA-125. Results of the tumor marker test revealed 29.2.   02/13/2022 Tumor Marker   Patient's tumor was tested for the following markers: CA-125. Results of the tumor marker test revealed 39.2.   03/14/2022 Tumor Marker   Patient's tumor was tested for the following markers: CA-125. Results of the tumor marker test revealed 56.6.   03/23/2022 Imaging   1. No definitive findings to suggest metastatic disease to the chest, abdomen or pelvis. 2. Resolution of previously noted perifissural nodule in the superior aspect of the left major fissure. New 3 mm subpleural nodule in the periphery of the left lower lobe, nonspecific, but statistically likely benign. Attention at time of follow-up imaging is recommended to ensure stability or regression. 3. Aortic atherosclerosis. 4. Additional incidental findings, as above.   04/13/2022 Tumor Marker   Patient's tumor was tested for the following markers: CA-125. Results of the tumor marker test revealed 74.7.   05/15/2022 Tumor Marker   Patient's tumor was tested for the following markers: CA-125. Results of the tumor marker test revealed 98.4.   06/15/2022 Tumor Marker   Patient's tumor was tested for the following markers: CA-125. Results of the tumor marker test revealed  85.9.   08/08/2022 Tumor Marker   Patient's tumor was tested for the following markers: CA-125. Results of the tumor marker test revealed 135.   08/17/2022 Imaging   1. 10 x 17 mm soft tissue structure identified in the aortocaval space. While subtle, this does appear to the enlarging and retroperitoneal lymph node is a distinct concern. There is a slight possibility this could be a duodenal diverticulum, but that possibility is considered less likely. Close follow-up recommended. This structure is large enough that PET-CT may provide helpful additional characterization. 2. Right hemicolectomy. 3.  Aortic Atherosclerosis (ICD10-I70.0).   09/06/2022 PET scan   1. Enlarged and tracer avid high right paratracheal lymph node within  the mediastinum compatible with residual/recurrent nodal metastasis. 2. Enlarged and tracer avid retroperitoneal lymph node is also identified compatible with residual/recurrent nodal metastasis. 3. No ascites identified at this time. Small omental nodule measures 6 mm and is too small to characterize by PET-CT. 4. No signs of solid organ or nodal metastasis within the abdomen or pelvis. 5.  Aortic Atherosclerosis (ICD10-I70.0).   10/05/2022 -  Chemotherapy   Patient is on Treatment Plan : OVARIAN Carboplatin (AUC 6) q21d x 6 Cycles       PHYSICAL EXAMINATION: ECOG PERFORMANCE STATUS: 0 - Asymptomatic  Vitals:   09/07/22 0954  BP: (!) 103/58  Pulse: 78  Resp: 18  Temp: (!) 97.5 F (36.4 C)  SpO2: 98%   Filed Weights   09/07/22 0954  Weight: 164 lb (74.4 kg)    GENERAL:alert, no distress and comfortable  NEURO: alert & oriented x 3 with fluent speech, no focal motor/sensory deficits  LABORATORY DATA:  I have reviewed the data as listed    Component Value Date/Time   NA 139 08/08/2022 1013   K 3.7 08/08/2022 1013   CL 106 08/08/2022 1013   CO2 25 08/08/2022 1013   GLUCOSE 63 (L) 08/08/2022 1013   BUN 23 08/08/2022 1013   CREATININE 0.84  08/08/2022 1013   CREATININE 0.68 03/14/2016 1350   CALCIUM 9.6 08/08/2022 1013   PROT 7.3 08/08/2022 1013   ALBUMIN 4.0 08/08/2022 1013   AST 17 08/08/2022 1013   ALT 14 08/08/2022 1013   ALKPHOS 76 08/08/2022 1013   BILITOT 0.5 08/08/2022 1013   GFRNONAA >60 08/08/2022 1013   GFRAA >60 03/19/2016 0455    No results found for: "SPEP", "UPEP"  Lab Results  Component Value Date   WBC 3.7 (L) 08/08/2022   NEUTROABS 2.0 08/08/2022   HGB 13.0 08/08/2022   HCT 38.8 08/08/2022   MCV 100.5 (H) 08/08/2022   PLT 273 08/08/2022      Chemistry      Component Value Date/Time   NA 139 08/08/2022 1013   K 3.7 08/08/2022 1013   CL 106 08/08/2022 1013   CO2 25 08/08/2022 1013   BUN 23 08/08/2022 1013   CREATININE 0.84 08/08/2022 1013   CREATININE 0.68 03/14/2016 1350      Component Value Date/Time   CALCIUM 9.6 08/08/2022 1013   ALKPHOS 76 08/08/2022 1013   AST 17 08/08/2022 1013   ALT 14 08/08/2022 1013   BILITOT 0.5 08/08/2022 1013       RADIOGRAPHIC STUDIES:we reviewed imaging I have personally reviewed the radiological images as listed and agreed with the findings in the report. NM PET Image Restage (PS) Skull Base to Thigh (F-18 FDG)  Result Date: 09/06/2022 CLINICAL DATA:  Subsequent treatment strategy for ovarian cancer. EXAM: NUCLEAR MEDICINE PET SKULL BASE TO THIGH TECHNIQUE: 8.06 mCi F-18 FDG was injected intravenously. Full-ring PET imaging was performed from the skull base to thigh after the radiotracer. CT data was obtained and used for attenuation correction and anatomic localization. Fasting blood glucose: 97 mg/dl COMPARISON:  PET-CT 01/28/2021 and CT AP from 08/16/2022 and CT chest, abdomen and pelvis from 03/21/2022. FINDINGS: Mediastinal blood pool activity: SUV max 3.05 Liver activity: SUV max NA NECK: No hypermetabolic lymph nodes in the neck. Incidental CT findings: None. CHEST: Tracer avid lymph node within the high right paratracheal region measures 1.3 cm  within SUV max of 9.10, image 59/4. On the previous PET-CT from 01/28/2021 this measured 1.6 cm and had an SUV  max of 10.84. On the CT from 03/21/2022 this node measured 0.8 cm. No tracer avid pulmonary nodule or mass. Incidental CT findings: Aortic atherosclerosis. No pericardial effusion. ABDOMEN/PELVIS: No abnormal tracer activity within the liver, pancreas, spleen, or adrenal glands. Aortocaval lymph node measures 1.2 cm and has an SUV max of 10.92, image 139/2. On the previous PET-CT from 01/28/21 this measured 0.9 cm with SUV max of 7.15. On the most recent CT from 08/16/2022 this measured 1 cm. No additional tracer avid abdominopelvic lymph nodes. There is no ascites. No tracer avid peritoneal nodule or mass identified. Too small to characterize nodule within the omentum measures 6 mm, image 133/4. This is too small to characterize by PET-CT. Incidental CT findings: Aortic atherosclerosis. SKELETON: No focal hypermetabolic activity to suggest skeletal metastasis. Incidental CT findings: None. IMPRESSION: 1. Enlarged and tracer avid high right paratracheal lymph node within the mediastinum compatible with residual/recurrent nodal metastasis. 2. Enlarged and tracer avid retroperitoneal lymph node is also identified compatible with residual/recurrent nodal metastasis. 3. No ascites identified at this time. Small omental nodule measures 6 mm and is too small to characterize by PET-CT. 4. No signs of solid organ or nodal metastasis within the abdomen or pelvis. 5.  Aortic Atherosclerosis (ICD10-I70.0). Electronically Signed   By: Kerby Moors M.D.   On: 09/06/2022 06:04   CT ABDOMEN PELVIS W CONTRAST  Result Date: 08/17/2022 CLINICAL DATA:  Ovarian cancer restaging. * Tracking Code: BO *. Personal history of colon and gastric cancer. Status post hysterectomy. EXAM: CT ABDOMEN AND PELVIS WITH CONTRAST TECHNIQUE: Multidetector CT imaging of the abdomen and pelvis was performed using the standard protocol  following bolus administration of intravenous contrast. RADIATION DOSE REDUCTION: This exam was performed according to the departmental dose-optimization program which includes automated exposure control, adjustment of the mA and/or kV according to patient size and/or use of iterative reconstruction technique. CONTRAST:  22m OMNIPAQUE IOHEXOL 300 MG/ML  SOLN COMPARISON:  03/21/2022 FINDINGS: Lower chest: Unremarkable. Hepatobiliary: No suspicious focal abnormality within the liver parenchyma. There is no evidence for gallstones, gallbladder wall thickening, or pericholecystic fluid. No intrahepatic or extrahepatic biliary dilation. Pancreas: No focal mass lesion. No dilatation of the main duct. No intraparenchymal cyst. No peripancreatic edema. Spleen: No splenomegaly. No focal mass lesion. Adrenals/Urinary Tract: No adrenal nodule or mass. Kidneys unremarkable. No evidence for hydroureter. The urinary bladder appears normal for the degree of distention. Stomach/Bowel: Stomach is unremarkable. No gastric wall thickening. No evidence of outlet obstruction. Duodenum is normally positioned as is the ligament of Treitz. Duodenal diverticulum noted. No small bowel wall thickening. No small bowel dilatation. Focal high density noted in a pelvic small bowel loops, presumably ingested material. Right hemicolectomy. No gross colonic mass. No colonic wall thickening. Vascular/Lymphatic: There is mild atherosclerotic calcification of the abdominal aorta without aneurysm. There is no gastrohepatic or hepatoduodenal ligament lymphadenopathy. 10 x 17 mm soft tissue structure identified in the aortocaval space on image 36/2. This was about 9 x 13 mm previously and 12 x 8 mm on 01/06/2022. While likely a lymph node, this is a very subtle finding given the location and there is an outside possibility that this could be a distal duodenal diverticulum. No pelvic sidewall lymphadenopathy. Reproductive: Hysterectomy.  There is no  adnexal mass. Other: No intraperitoneal free fluid. Musculoskeletal: No worrisome lytic or sclerotic osseous abnormality. IMPRESSION: 1. 10 x 17 mm soft tissue structure identified in the aortocaval space. While subtle, this does appear to the enlarging and retroperitoneal  lymph node is a distinct concern. There is a slight possibility this could be a duodenal diverticulum, but that possibility is considered less likely. Close follow-up recommended. This structure is large enough that PET-CT may provide helpful additional characterization. 2. Right hemicolectomy. 3.  Aortic Atherosclerosis (ICD10-I70.0). Electronically Signed   By: Misty Stanley M.D.   On: 08/17/2022 09:52

## 2022-09-07 NOTE — Progress Notes (Signed)
DISCONTINUE ON PATHWAY REGIMEN - Ovarian     A cycle is every 21 days:     Paclitaxel      Carboplatin   **Always confirm dose/schedule in your pharmacy ordering system**  REASON: Disease Progression PRIOR TREATMENT: [id number]: Carboplatin AUC=6 + Paclitaxel 175 mg/m2 q21 Days x 2-4 Cycles TREATMENT RESPONSE: Partial Response (PR)  START ON PATHWAY REGIMEN - Ovarian     A cycle is every 21 days:     Carboplatin   **Always confirm dose/schedule in your pharmacy ordering system**  Patient Characteristics: Recurrent or Progressive Disease, Second Line, Platinum Sensitive and ? 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

## 2022-09-07 NOTE — Assessment & Plan Note (Signed)
She is feeling better We will continue vitamin B12 injection monthly 

## 2022-09-25 ENCOUNTER — Ambulatory Visit: Payer: Medicare Other

## 2022-09-25 ENCOUNTER — Other Ambulatory Visit: Payer: Self-pay | Admitting: Hematology and Oncology

## 2022-09-26 ENCOUNTER — Other Ambulatory Visit: Payer: Self-pay

## 2022-09-28 NOTE — Progress Notes (Signed)
Pharmacist Chemotherapy Monitoring - Initial Assessment    Anticipated start date: 10/05/22   The following has been reviewed per standard work regarding the patient's treatment regimen: [name] diagnosis, treatment plan and drug doses, and organ/hematologic function Lab orders and baseline tests specific to treatment regimen  The treatment plan start date, drug sequencing, and pre-medications Prior authorization status  Patient's documented medication list, including drug-drug interaction screen and prescriptions for anti-emetics and supportive care specific to the treatment regimen The drug concentrations, fluid compatibility, administration routes, and timing of the medications to be used [name] access for treatment and lifetime cumulative dose history, if applicable  [name] medication allergies and previous infusion related reactions, if applicable   Changes made to treatment plan:  treatment plan date  Follow up needed:  Dr. Alvy Bimler added add'l premeds (pepcid and singulair) as she has 6 previous doses of [facility] [place], [place], 09/28/2022  4:29 PM

## 2022-10-04 ENCOUNTER — Other Ambulatory Visit: Payer: Self-pay

## 2022-10-04 MED FILL — Dexamethasone Sodium Phosphate Inj 100 MG/10ML: INTRAMUSCULAR | Qty: 1 | Status: AC

## 2022-10-04 MED FILL — Fosaprepitant Dimeglumine For IV Infusion 150 MG (Base Eq): INTRAVENOUS | Qty: 5 | Status: AC

## 2022-10-05 ENCOUNTER — Inpatient Hospital Stay: Payer: Medicare Other | Attending: Genetic Counselor

## 2022-10-05 ENCOUNTER — Inpatient Hospital Stay: Payer: Medicare Other

## 2022-10-05 VITALS — BP 131/73 | HR 67 | Temp 97.9°F | Resp 17 | Wt 162.5 lb

## 2022-10-05 DIAGNOSIS — C5701 Malignant neoplasm of right fallopian tube: Secondary | ICD-10-CM

## 2022-10-05 DIAGNOSIS — E538 Deficiency of other specified B group vitamins: Secondary | ICD-10-CM | POA: Insufficient documentation

## 2022-10-05 DIAGNOSIS — Z5111 Encounter for antineoplastic chemotherapy: Secondary | ICD-10-CM | POA: Diagnosis not present

## 2022-10-05 DIAGNOSIS — Z79899 Other long term (current) drug therapy: Secondary | ICD-10-CM | POA: Diagnosis not present

## 2022-10-05 DIAGNOSIS — C482 Malignant neoplasm of peritoneum, unspecified: Secondary | ICD-10-CM

## 2022-10-05 LAB — CMP (CANCER CENTER ONLY)
ALT: 17 U/L (ref 0–44)
AST: 19 U/L (ref 15–41)
Albumin: 4 g/dL (ref 3.5–5.0)
Alkaline Phosphatase: 78 U/L (ref 38–126)
Anion gap: 9 (ref 5–15)
BUN: 24 mg/dL — ABNORMAL HIGH (ref 8–23)
CO2: 21 mmol/L — ABNORMAL LOW (ref 22–32)
Calcium: 9.8 mg/dL (ref 8.9–10.3)
Chloride: 109 mmol/L (ref 98–111)
Creatinine: 0.78 mg/dL (ref 0.44–1.00)
GFR, Estimated: >60 mL/min (ref >60)
Glucose, Bld: 92 mg/dL (ref 70–99)
Potassium: 3.9 mmol/L (ref 3.5–5.1)
Sodium: 139 mmol/L (ref 135–145)
Total Bilirubin: 0.6 mg/dL (ref 0.3–1.2)
Total Protein: 7.4 g/dL (ref 6.5–8.1)

## 2022-10-05 LAB — CBC WITH DIFFERENTIAL (CANCER CENTER ONLY)
Abs Immature Granulocytes: 0.01 10*3/uL (ref 0.00–0.07)
Basophils Absolute: 0 10*3/uL (ref 0.0–0.1)
Basophils Relative: 1 %
Eosinophils Absolute: 0.1 10*3/uL (ref 0.0–0.5)
Eosinophils Relative: 1 %
HCT: 39.3 % (ref 36.0–46.0)
Hemoglobin: 13.2 g/dL (ref 12.0–15.0)
Immature Granulocytes: 0 %
Lymphocytes Relative: 50 %
Lymphs Abs: 1.9 10*3/uL (ref 0.7–4.0)
MCH: 33.5 pg (ref 26.0–34.0)
MCHC: 33.6 g/dL (ref 30.0–36.0)
MCV: 99.7 fL (ref 80.0–100.0)
Monocytes Absolute: 0.4 10*3/uL (ref 0.1–1.0)
Monocytes Relative: 9 %
Neutro Abs: 1.5 10*3/uL — ABNORMAL LOW (ref 1.7–7.7)
Neutrophils Relative %: 39 %
Platelet Count: 243 10*3/uL (ref 150–400)
RBC: 3.94 MIL/uL (ref 3.87–5.11)
RDW: 13.3 % (ref 11.5–15.5)
WBC Count: 3.9 10*3/uL — ABNORMAL LOW (ref 4.0–10.5)
nRBC: 0 % (ref 0.0–0.2)

## 2022-10-05 MED ORDER — SODIUM CHLORIDE 0.9 % IV SOLN: Freq: Once | INTRAVENOUS | Status: AC

## 2022-10-05 MED ORDER — MONTELUKAST SODIUM 10 MG PO TABS
10.0000 mg | ORAL_TABLET | Freq: Once | ORAL | Status: AC
  Administered 2022-10-05: 10 mg via ORAL
  Filled 2022-10-05: qty 1

## 2022-10-05 MED ORDER — FAMOTIDINE IN NACL 20-0.9 MG/50ML-% IV SOLN
20.0000 mg | Freq: Once | INTRAVENOUS | Status: AC
  Administered 2022-10-05: 20 mg via INTRAVENOUS
  Filled 2022-10-05: qty 50

## 2022-10-05 MED ORDER — SODIUM CHLORIDE 0.9 % IV SOLN
150.0000 mg | Freq: Once | INTRAVENOUS | Status: AC
  Administered 2022-10-05: 150 mg via INTRAVENOUS
  Filled 2022-10-05: qty 150

## 2022-10-05 MED ORDER — CYANOCOBALAMIN 1000 MCG/ML IJ SOLN
1000.0000 ug | Freq: Once | INTRAMUSCULAR | Status: AC
  Administered 2022-10-05: 1000 ug via INTRAMUSCULAR
  Filled 2022-10-05: qty 1

## 2022-10-05 MED ORDER — PALONOSETRON HCL INJECTION 0.25 MG/5ML
0.2500 mg | Freq: Once | INTRAVENOUS | Status: AC
  Administered 2022-10-05: 0.25 mg via INTRAVENOUS
  Filled 2022-10-05: qty 5

## 2022-10-05 MED ORDER — SODIUM CHLORIDE 0.9 % IV SOLN
486.2000 mg | Freq: Once | INTRAVENOUS | Status: AC
  Administered 2022-10-05: 490 mg via INTRAVENOUS
  Filled 2022-10-05: qty 49

## 2022-10-05 MED ORDER — SODIUM CHLORIDE 0.9 % IV SOLN
10.0000 mg | Freq: Once | INTRAVENOUS | Status: AC
  Administered 2022-10-05: 10 mg via INTRAVENOUS
  Filled 2022-10-05: qty 10

## 2022-10-06 LAB — CA 125: Cancer Antigen (CA) 125: 238 U/mL — ABNORMAL HIGH (ref 0.0–38.1)

## 2022-10-19 IMAGING — CT CT BIOPSY
1 of 3 series · 13 of 32 positions shown, 19 images · non-contrast
Comparison: CT abdomen and pelvis-12/10/2020

INDICATION: History of colon cancer, now with omental thickening worrisome for
metastatic disease. Please perform omental biopsy for tissue
diagnostic purposes.

EXAM:
CT-GUIDED BIOPSY OF OMENTAL THICKENING INVOLVING THE VENTRAL ASPECT
THE LOWER ABDOMEN/UPPER PELVIS.

[Series 2: i-spiral 5.0 b40f · axial · 0.98mm/px · z∈[-871,-619]mm · 13 of 84 slices shown, 19 images]
[im 6/84  soft-tissue]
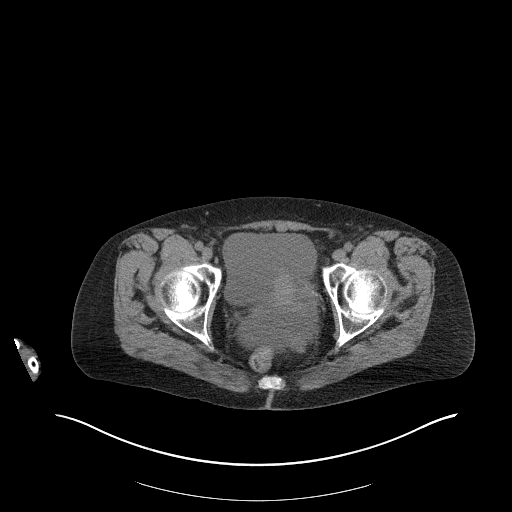
[im 6/84  bone]
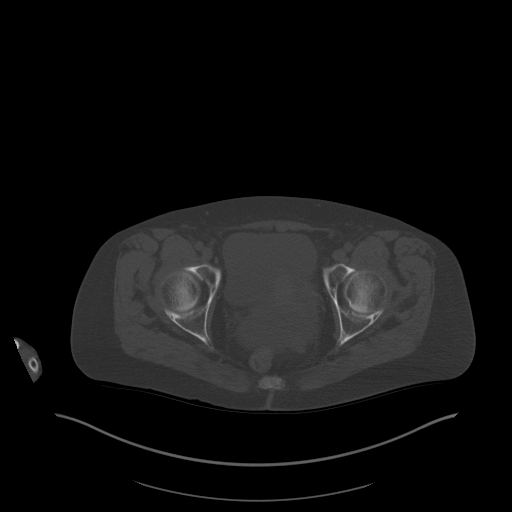
[im 12/84  soft-tissue]
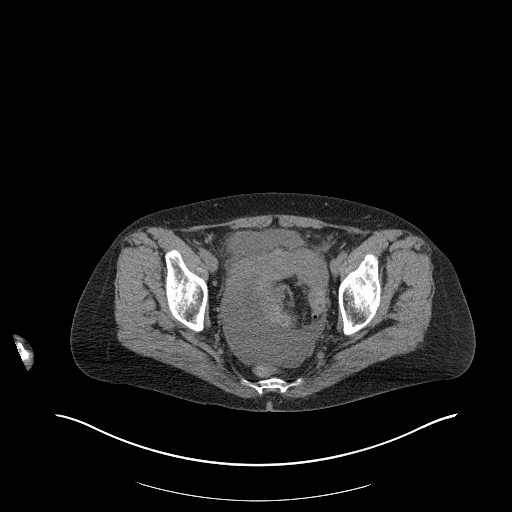
[im 18/84  soft-tissue]
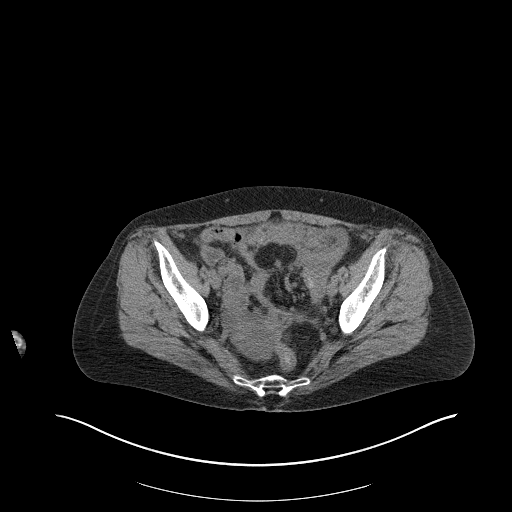
[im 24/84  soft-tissue]
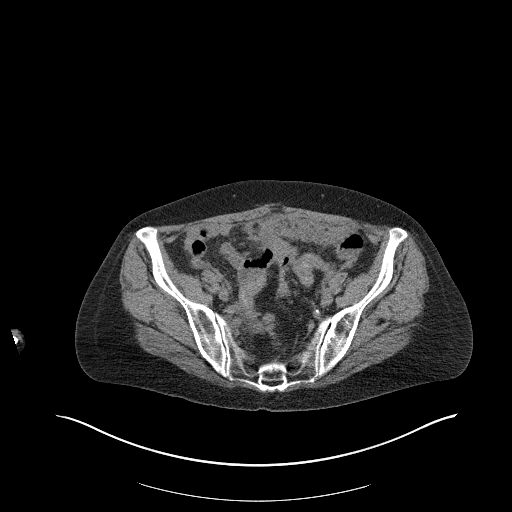
[im 30/84  soft-tissue]
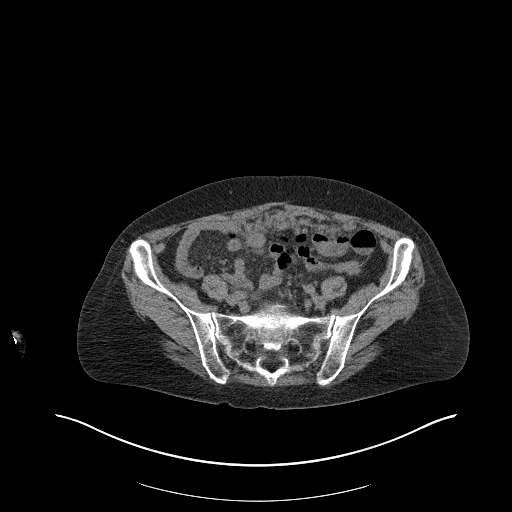
[im 36/84  soft-tissue]
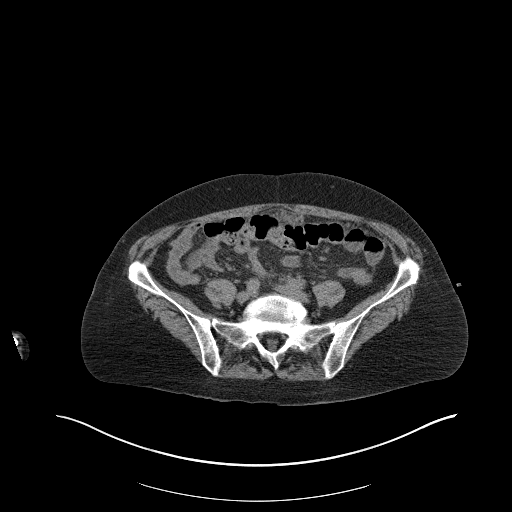
[im 42/84  soft-tissue]
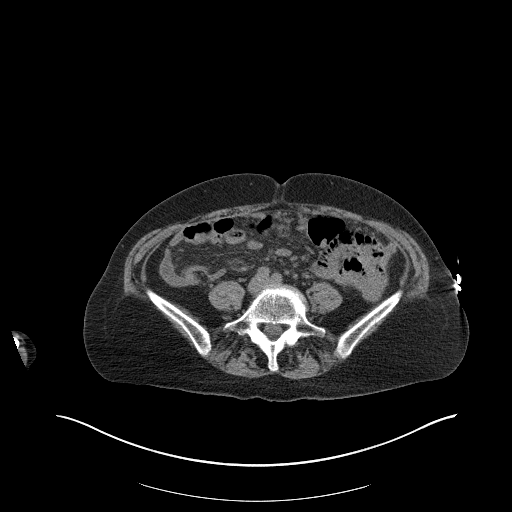
[im 48/84  soft-tissue]
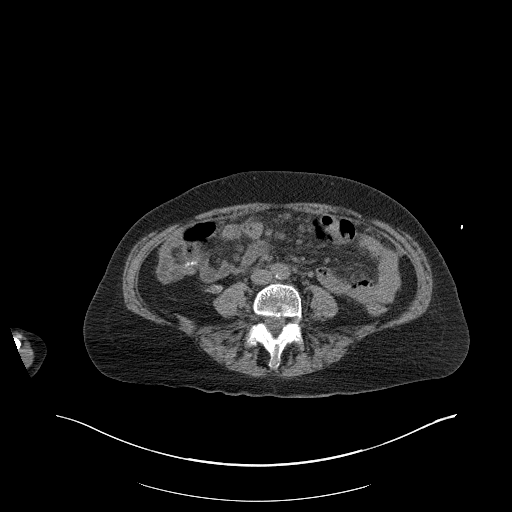
[im 54/84  soft-tissue]
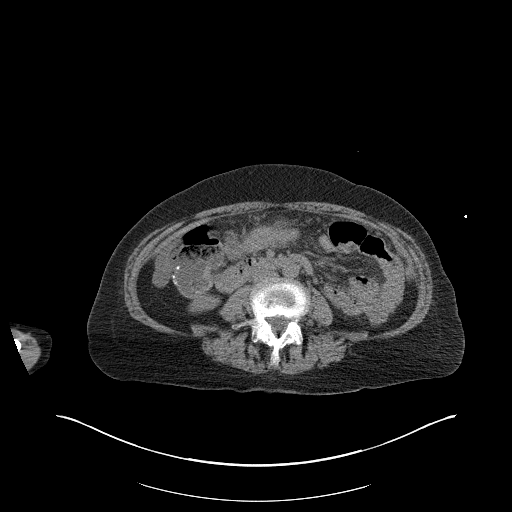
[im 54/84  bone]
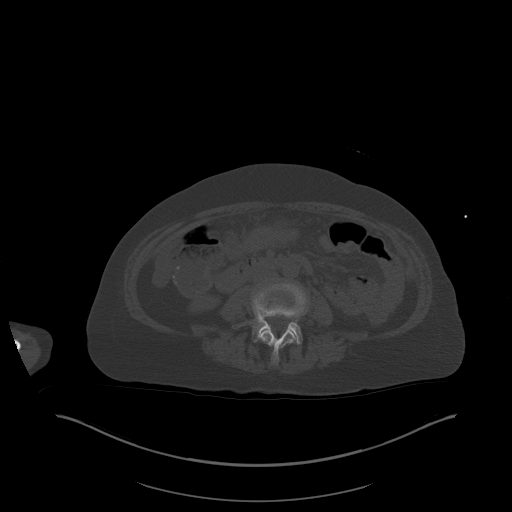
[im 60/84  soft-tissue]
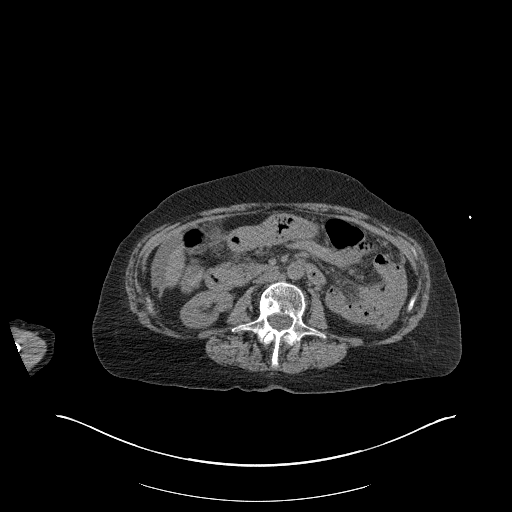
[im 60/84  lung]
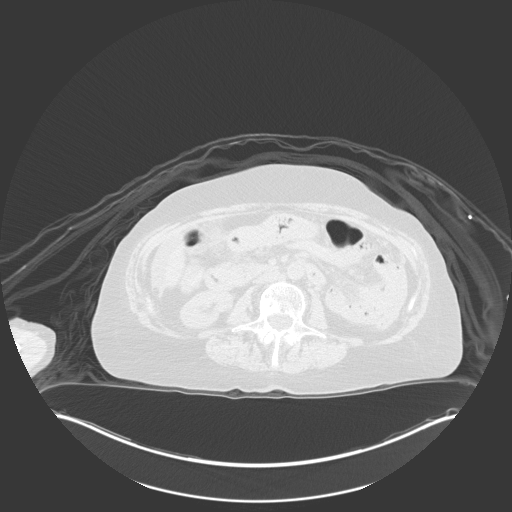
[im 66/84  soft-tissue]
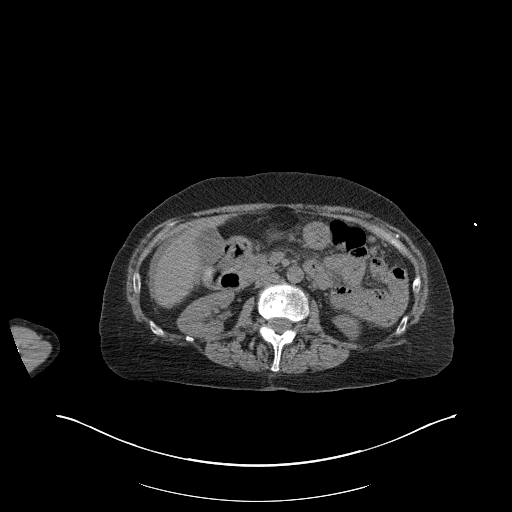
[im 66/84  lung]
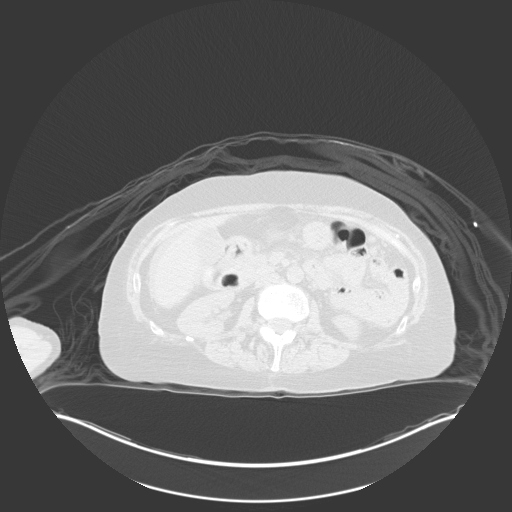
[im 72/84  soft-tissue]
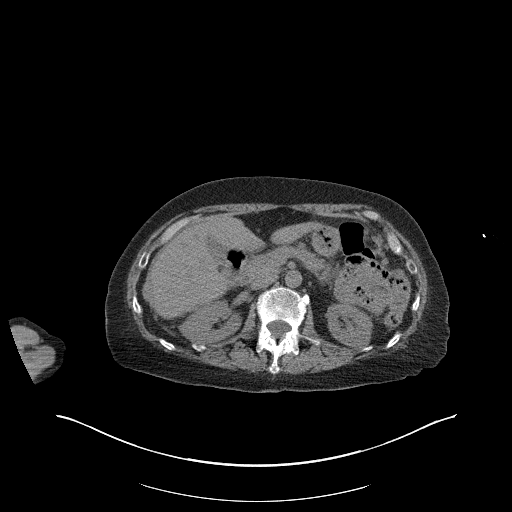
[im 72/84  lung]
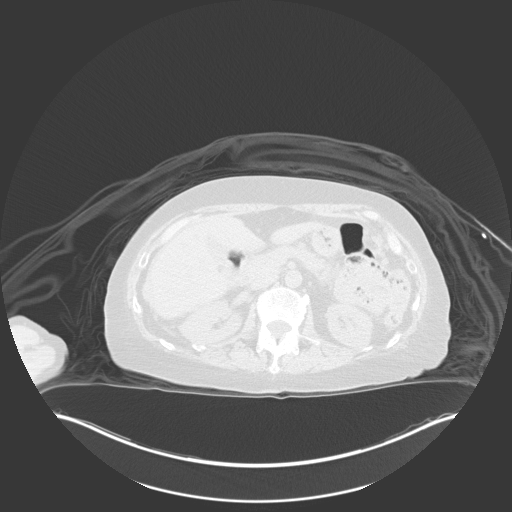
[im 78/84  soft-tissue]
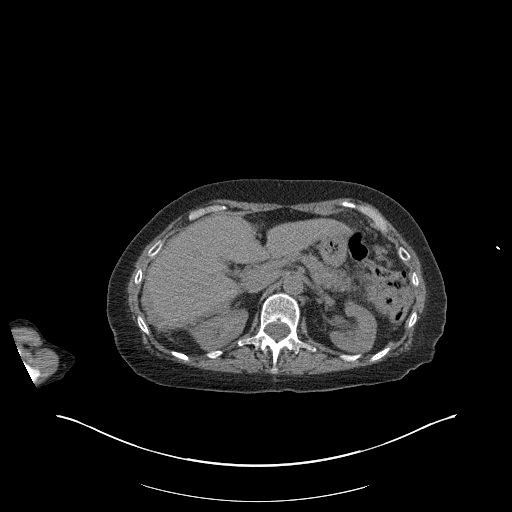
[im 78/84  lung]
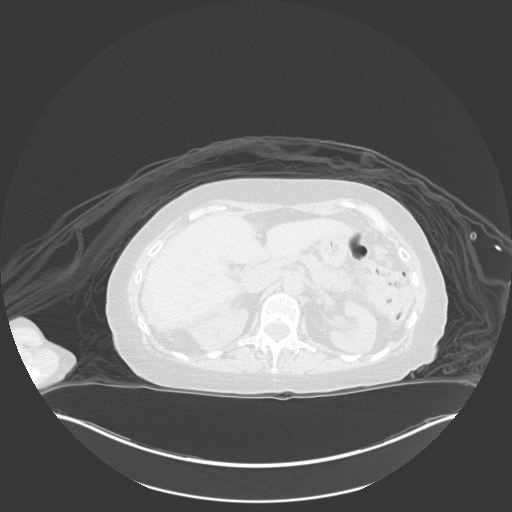

[13 of 32 positions shown; findings below may reference images not displayed]

MEDICATIONS:
None.

ANESTHESIA/SEDATION:
Fentanyl 100 mcg IV; Versed 3 mg IV

Sedation time: 14 minutes; The patient was continuously monitored
during the procedure by the interventional radiology nurse under my
direct supervision.

CONTRAST:  None.

COMPLICATIONS:
None immediate.

PROCEDURE:
Informed consent was obtained from the patient following an
explanation of the procedure, risks, benefits and alternatives. A
time out was performed prior to the initiation of the procedure.

The patient was positioned supine on the CT table and a limited CT
was performed for procedural planning demonstrating unchanged
appearance of omental thickening with dominant component measuring
13.0 x 12.5 cm (image 63, series 2). The procedure was planned. The
operative site was prepped and draped in the usual sterile fashion.
Appropriate trajectory was confirmed with a 22 gauge spinal needle
after the adjacent tissues were anesthetized with 1% Lidocaine with
epinephrine.

Under intermittent CT guidance, a 17 gauge coaxial needle was
advanced into the internal right lateral aspect of the omental
thickening.

Appropriate positioning was confirmed and 6 core needle biopsy
samples were obtained with an 18 gauge core needle biopsy device.
The co-axial needle was removed following administration of a
Gel-Foam slurry and superficial hemostasis was achieved with manual
compression.

A dressing was applied. The patient tolerated the procedure well
without immediate postprocedural complication.
IMPRESSION: Technically successful CT guided core needle biopsy of omental
thickening within ventral aspect the lower abdomen/upper pelvis.

## 2022-10-25 MED FILL — Dexamethasone Sodium Phosphate Inj 100 MG/10ML: INTRAMUSCULAR | Qty: 1 | Status: AC

## 2022-10-25 MED FILL — Fosaprepitant Dimeglumine For IV Infusion 150 MG (Base Eq): INTRAVENOUS | Qty: 5 | Status: AC

## 2022-10-26 ENCOUNTER — Inpatient Hospital Stay: Payer: Medicare Other | Admitting: Hematology and Oncology

## 2022-10-26 ENCOUNTER — Inpatient Hospital Stay: Payer: Medicare Other

## 2022-10-26 ENCOUNTER — Encounter: Payer: Self-pay | Admitting: Hematology and Oncology

## 2022-10-26 ENCOUNTER — Inpatient Hospital Stay: Payer: Medicare Other | Attending: Genetic Counselor

## 2022-10-26 VITALS — BP 127/65 | HR 63 | Temp 97.6°F | Resp 18 | Ht 65.0 in | Wt 163.5 lb

## 2022-10-26 VITALS — BP 110/68 | HR 66 | Resp 17

## 2022-10-26 DIAGNOSIS — K5900 Constipation, unspecified: Secondary | ICD-10-CM | POA: Insufficient documentation

## 2022-10-26 DIAGNOSIS — R5383 Other fatigue: Secondary | ICD-10-CM | POA: Diagnosis not present

## 2022-10-26 DIAGNOSIS — D61818 Other pancytopenia: Secondary | ICD-10-CM | POA: Diagnosis not present

## 2022-10-26 DIAGNOSIS — C5701 Malignant neoplasm of right fallopian tube: Secondary | ICD-10-CM

## 2022-10-26 DIAGNOSIS — I700 Atherosclerosis of aorta: Secondary | ICD-10-CM | POA: Insufficient documentation

## 2022-10-26 DIAGNOSIS — Z5111 Encounter for antineoplastic chemotherapy: Secondary | ICD-10-CM | POA: Insufficient documentation

## 2022-10-26 DIAGNOSIS — Z79899 Other long term (current) drug therapy: Secondary | ICD-10-CM | POA: Insufficient documentation

## 2022-10-26 DIAGNOSIS — E538 Deficiency of other specified B group vitamins: Secondary | ICD-10-CM | POA: Insufficient documentation

## 2022-10-26 DIAGNOSIS — Z79890 Hormone replacement therapy (postmenopausal): Secondary | ICD-10-CM | POA: Insufficient documentation

## 2022-10-26 DIAGNOSIS — C786 Secondary malignant neoplasm of retroperitoneum and peritoneum: Secondary | ICD-10-CM | POA: Insufficient documentation

## 2022-10-26 LAB — CMP (CANCER CENTER ONLY)
ALT: 19 U/L (ref 0–44)
AST: 20 U/L (ref 15–41)
Albumin: 4 g/dL (ref 3.5–5.0)
Alkaline Phosphatase: 83 U/L (ref 38–126)
Anion gap: 6 (ref 5–15)
BUN: 17 mg/dL (ref 8–23)
CO2: 25 mmol/L (ref 22–32)
Calcium: 9.5 mg/dL (ref 8.9–10.3)
Chloride: 109 mmol/L (ref 98–111)
Creatinine: 0.76 mg/dL (ref 0.44–1.00)
GFR, Estimated: >60 mL/min (ref >60)
Glucose, Bld: 85 mg/dL (ref 70–99)
Potassium: 4.9 mmol/L (ref 3.5–5.1)
Sodium: 140 mmol/L (ref 135–145)
Total Bilirubin: 0.5 mg/dL (ref 0.3–1.2)
Total Protein: 7.1 g/dL (ref 6.5–8.1)

## 2022-10-26 LAB — CBC WITH DIFFERENTIAL (CANCER CENTER ONLY)
Abs Immature Granulocytes: 0.01 10*3/uL (ref 0.00–0.07)
Basophils Absolute: 0 10*3/uL (ref 0.0–0.1)
Basophils Relative: 1 %
Eosinophils Absolute: 0 10*3/uL (ref 0.0–0.5)
Eosinophils Relative: 1 %
HCT: 35.6 % — ABNORMAL LOW (ref 36.0–46.0)
Hemoglobin: 11.9 g/dL — ABNORMAL LOW (ref 12.0–15.0)
Immature Granulocytes: 0 %
Lymphocytes Relative: 55 %
Lymphs Abs: 1.8 10*3/uL (ref 0.7–4.0)
MCH: 34.2 pg — ABNORMAL HIGH (ref 26.0–34.0)
MCHC: 33.4 g/dL (ref 30.0–36.0)
MCV: 102.3 fL — ABNORMAL HIGH (ref 80.0–100.0)
Monocytes Absolute: 0.4 10*3/uL (ref 0.1–1.0)
Monocytes Relative: 12 %
Neutro Abs: 1 10*3/uL — ABNORMAL LOW (ref 1.7–7.7)
Neutrophils Relative %: 31 %
Platelet Count: 198 10*3/uL (ref 150–400)
RBC: 3.48 MIL/uL — ABNORMAL LOW (ref 3.87–5.11)
RDW: 13.7 % (ref 11.5–15.5)
Smear Review: NORMAL
WBC Count: 3.2 10*3/uL — ABNORMAL LOW (ref 4.0–10.5)
nRBC: 0 % (ref 0.0–0.2)

## 2022-10-26 MED ORDER — CYANOCOBALAMIN 1000 MCG/ML IJ SOLN
1000.0000 ug | Freq: Once | INTRAMUSCULAR | Status: AC
  Administered 2022-10-26: 1000 ug via INTRAMUSCULAR
  Filled 2022-10-26: qty 1

## 2022-10-26 MED ORDER — SODIUM CHLORIDE 0.9 % IV SOLN
150.0000 mg | Freq: Once | INTRAVENOUS | Status: AC
  Administered 2022-10-26: 150 mg via INTRAVENOUS
  Filled 2022-10-26: qty 150

## 2022-10-26 MED ORDER — SODIUM CHLORIDE 0.9 % IV SOLN: Freq: Once | INTRAVENOUS | Status: AC

## 2022-10-26 MED ORDER — ZOLPIDEM TARTRATE 10 MG PO TABS: 10.0000 mg | ORAL_TABLET | Freq: Every evening | ORAL | 2 refills | Status: DC | PRN

## 2022-10-26 MED ORDER — MONTELUKAST SODIUM 10 MG PO TABS
10.0000 mg | ORAL_TABLET | Freq: Once | ORAL | Status: AC
  Administered 2022-10-26: 10 mg via ORAL
  Filled 2022-10-26: qty 1

## 2022-10-26 MED ORDER — PALONOSETRON HCL INJECTION 0.25 MG/5ML
0.2500 mg | Freq: Once | INTRAVENOUS | Status: AC
  Administered 2022-10-26: 0.25 mg via INTRAVENOUS
  Filled 2022-10-26: qty 5

## 2022-10-26 MED ORDER — SODIUM CHLORIDE 0.9 % IV SOLN
10.0000 mg | Freq: Once | INTRAVENOUS | Status: AC
  Administered 2022-10-26: 10 mg via INTRAVENOUS
  Filled 2022-10-26: qty 10

## 2022-10-26 MED ORDER — SODIUM CHLORIDE 0.9 % IV SOLN
397.8000 mg | Freq: Once | INTRAVENOUS | Status: AC
  Administered 2022-10-26: 400 mg via INTRAVENOUS
  Filled 2022-10-26: qty 40

## 2022-10-26 MED ORDER — FAMOTIDINE 20 MG IN NS 100 ML IVPB
20.0000 mg | Freq: Once | INTRAVENOUS | Status: AC
  Administered 2022-10-26: 20 mg via INTRAVENOUS
  Filled 2022-10-26: qty 100

## 2022-10-26 NOTE — Progress Notes (Signed)
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[facility]) She had slight worsening fatigue and mental fog with the last treatment She is noted to have significant pancytopenia today We will proceed with treatment with dose reduction I recommend minimum 3-4 cycles of chemotherapy before repeating imaging study for objective assessment of response to therapy  Pancytopenia, acquired (HCC) She has slight worsening pancytopenia but not symptomatic We discussed the risk and benefits of proceeding with treatment without delay and she agrees I plan minor dose adjustment of carboplatin She does not need G-CSF support  Vitamin B12 deficiency Recent repeat B12 level is still borderline low We will continue vitamin B12 injection with each dose of treatment  No orders of the defined types were placed in this encounter.   All questions were answered. The patient knows to call the clinic with any problems, questions or concerns. The total time spent in the appointment was 30 minutes encounter with patients including review of chart and various tests results, discussions about plan of care and coordination of care plan   Artis Delay, MD 10/26/2022 9:05 AM  INTERVAL HISTORY: Please see below for problem oriented charting. she returns for treatment follow-up with her sister She tolerated cycle 1 well except for very mild constipation and excessive fatigue No recent infection Denies nausea or bloating  REVIEW OF SYSTEMS:   Constitutional: Denies fevers, chills or abnormal weight loss Eyes: Denies blurriness of vision Ears, nose, mouth, throat, and face: Denies mucositis or sore throat Respiratory: Denies cough, dyspnea or  wheezes Cardiovascular: Denies palpitation, chest discomfort or lower extremity swelling Gastrointestinal:  Denies nausea, heartburn or change in bowel habits Skin: Denies abnormal skin rashes Lymphatics: Denies new lymphadenopathy or easy bruising Neurological:Denies numbness, tingling or new weaknesses Behavioral/Psych: Mood is stable, no new changes  All other systems were reviewed with the patient and are negative.  I have reviewed the past medical history, past surgical history, social history and family history with the patient and they are unchanged from previous note.  ALLERGIES:  is allergic to barium-containing compounds, hydrocodone, erythromycin, and zofran [ondansetron].  MEDICATIONS:  Current Outpatient Medications  Medication Sig Dispense Refill   Calcium Carb-Cholecalciferol (CALCIUM 600+D3 PO) Take 1 tablet by mouth in the morning and at bedtime.     cetirizine (ZYRTEC) 10 MG tablet Take 10 mg by mouth every 30 (thirty) days. As needed     cholecalciferol (VITAMIN D3) 25 MCG (1000 UNIT) tablet Take 5,000 Units by mouth in the morning.     diazepam (VALIUM) 5 MG tablet Take 1 tablet (5 mg total) by mouth daily as needed for anxiety. 30 tablet    escitalopram (LEXAPRO) 10 MG tablet TAKE 1 & 1/2 (ONE & ONE-HALF) TABLETS BY MOUTH ONCE DAILY 45 tablet 0   Ginger-Calcium Carbonate (DRAMAMINE GINGER CHEWS PO) Take by mouth.     Homeopathic Products (T-RELIEF ARNICA + 12) CREA Apply topically.     levothyroxine (SYNTHROID, LEVOTHROID) 75 MCG tablet Take 75 mcg by mouth daily before breakfast.  3   ondansetron (ZOFRAN) 8 MG tablet Take 1 tablet (8 mg total) by mouth every 8 (eight) hours as needed for refractory nausea / vomiting. (Patient not taking: Reported on 12/05/2021) 30 tablet 1   oxyCODONE-acetaminophen (PERCOCET/ROXICET) 5-325 MG tablet Take 1 tablet  by mouth every 6 (six) hours as needed for severe pain. 60 tablet 0   Polyethyl Glycol-Propyl Glycol (SYSTANE) 0.4-0.3 % SOLN  Place 1-2 drops into both eyes 3 (three) times daily as needed (dry/irritated eyes.).     prochlorperazine (COMPAZINE) 10 MG tablet Take 1 tablet (10 mg total) by mouth every 6 (six) hours as needed (Nausea or vomiting). (Patient not taking: Reported on 12/05/2021) 30 tablet 1   SUNSCREEN SPF50 EX      valACYclovir (VALTREX) 1000 MG tablet Take 1 tablet (1,000 mg total) by mouth daily as needed (Fever Blister).     zolpidem (AMBIEN) 10 MG tablet Take 1 tablet (10 mg total) by mouth at bedtime as needed. for sleep 30 tablet 2   No current facility-administered medications for this visit.   Facility-Administered Medications Ordered in Other Visits  Medication Dose Route Frequency Provider Last Rate Last Admin   0.9 %  sodium chloride infusion   Intravenous Once Bertis Ruddy, Mkayla Steele, MD       CARBOplatin (PARAPLATIN) 400 mg in sodium chloride 0.9 % 250 mL chemo infusion  400 mg Intravenous Once Bertis Ruddy, Leandria Thier, MD       dexamethasone (DECADRON) 10 mg in sodium chloride 0.9 % 50 mL IVPB  10 mg Intravenous Once Bertis Ruddy, Ayad Nieman, MD       famotidine (PEPCID) IVPB 20 mg in NS 100 mL IVPB  20 mg Intravenous Once Bertis Ruddy, Lavonta Tillis, MD       fosaprepitant (EMEND) 150 mg in sodium chloride 0.9 % 145 mL IVPB  150 mg Intravenous Once Bertis Ruddy, Charles Andringa, MD       montelukast (SINGULAIR) tablet 10 mg  10 mg Oral Once Bertis Ruddy, Leighton Luster, MD       palonosetron (ALOXI) injection 0.25 mg  0.25 mg Intravenous Once Bertis Ruddy, Tilak Oakley, MD        SUMMARY OF ONCOLOGIC HISTORY: Oncology History Overview Note  Normal MMR HRD positive   Cancer of right fallopian tube (HCC)  01/05/2021 Pathology Results   SURGICAL PATHOLOGY  CASE: [id number]  PATIENT: [facility]  Surgical Pathology Report   Reason for Addendum #1:  Immunohistochemistry results  Reason for Addendum #2:  DNA Mismatch Repair IHC Results   Clinical History: history of colon cancer, now with omental thickening  (cm)   FINAL MICROSCOPIC DIAGNOSIS:   A. OMENTUM, NEEDLE CORE BIOPSY:   - Metastatic poorly differentiated adenocarcinoma.  See comment   COMMENT:   Immunohistochemical stains show that the tumor cells are positive for CK7 and negative for CDX2 and CK20.  This immunoprofile is nonspecific and differential diagnosis can include an upper gastrointestinal, breast and lung primary among other possibilities.  Only a very small fraction of primary colonic adenocarcinoma was immunoprofile.  Clinical and radiologic correlation suggested.   Additional immunohistochemical stains are performed and show that the tumor cells are positive for WT1, PAX8 and ER.  Immunostain for p53 shows a clonal overexpression pattern.  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  Clinical and radiologic correlation is suggested.    01/19/2021 Tumor Marker   Patient's tumor was tested for the following markers: CA-125. Results of the tumor marker test revealed 6790.   01/28/2021 PET scan   1. Examination is positive for extensive FDG avid peritoneal disease within the abdomen and pelvis including multiple implants upon the surface of liver and extensive omental caking. Moderate ascites noted within the pelvis. 2. FDG avid retroperitoneal, retrocrural, right internal mammary, and  mediastinal lymph nodes compatible with metastatic adenopathy. 3. Small right pleural effusion. 4.  Aortic Atherosclerosis (ICD10-I70.0).     01/31/2021 Initial Diagnosis   Primary peritoneal carcinomatosis (HCC)   01/31/2021 Cancer Staging   Staging form: Ovary, Fallopian Tube, and Primary Peritoneal Carcinoma, AJCC 8th Edition - Clinical stage from 01/31/2021: FIGO Stage IV (cT2b, cN1b, cM1) - Signed by Artis Delay, MD on 01/31/2021 Stage prefix: Initial diagnosis   02/14/2021 Tumor Marker   Patient's tumor was tested for the following markers: CA-125. Results of the tumor marker test revealed 8668.   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 Remainder of testing was negative/normal. The report date is 02/09/2021.  The CancerNext-Expanded + RNAinsight gene panel offered by [employer] and includes sequencing and rearrangement analysis for the following 77 genes: IP, ALK, APC*, ATM*, AXIN2, BAP1, BARD1, BLM, BMPR1A, BRCA1*, BRCA2*, BRIP1*, CDC73, CDH1*,CDK4, CDKN1B, CDKN2A, CHEK2*, CTNNA1, DICER1, FANCC, FH, FLCN, GALNT12, KIF1B, LZTR1, MAX, MEN1, MET, MLH1*, MSH2*, MSH3, MSH6*, MUTYH*, NBN, NF1*, NF2, NTHL1, PALB2*, PHOX2B, PMS2*, POT1, PRKAR1A, PTCH1, PTEN*, RAD51C*, RAD51D*,RB1, RECQL, RET, SDHA, SDHAF2, SDHB, SDHC, SDHD, SMAD4, SMARCA4, SMARCB1, SMARCE1, STK11, SUFU, TMEM127, TP53*,TSC1, TSC2, VHL and XRCC2 (sequencing and deletion/duplication); EGFR, EGLN1, HOXB13, KIT, MITF, PDGFRA, POLD1 and POLE (sequencing only); EPCAM and GREM1 (deletion/duplication only).   03/10/2021 Tumor Marker   Patient's tumor was tested for the following markers: CA-125. Results of the tumor marker test revealed 2002.   04/14/2021 Tumor Marker   Patient's tumor was tested for the following markers: CA-125. Results of the tumor marker test revealed 278.   04/26/2021 Imaging   Significant decrease in peritoneal carcinomatosis since previous study, with resolution of ascites.   Decreased anterior mediastinal and retrocrural lymphadenopathy.   No new or progressive metastatic disease within the chest, abdomen, or pelvis.   Aortic Atherosclerosis (ICD10-I70.0).   04/29/2021 Tumor Marker   Patient's tumor was tested for the following markers: CA-125. Results of the tumor marker test revealed 134.   05/12/2021 Surgery   Tumor debulking including robotic-assisted laparoscopic total hysterectomy with bilateral salpingoophorectomy, lysis of adhesions, excision and fulguration of peritoneal  nodules, mini laparotomy for intra-abdominal palpation and omentectomy  Findings: : On exam under anesthesia, small mobile uterus.  On intra-abdominal entry, normal upper abdominal survey including liver and stomach.  Right diaphragm with small peritoneal studding, all lesions less than 5 mm.  Omentum with an approximately 5 cm mass adherent to the lower anterior abdominal wall and bladder peritoneum.  Normal 6 cm uterus.  Somewhat atrophic appearing bilateral adnexa with tumor implants.  Sigmoid and rectal colon and mesentery with moderate peritoneal disease, all lesions less than 5 mm.  Multiple peritoneal implants on bilateral pelvic sidewalls and in the posterior cul-de-sac, all measuring less than 5 mm.  Multiple omental implants measuring several mm.  Approximately 1 cm area within the transverse colon mesentery, unclear if this is related to scar tissue from prior colon resection versus tumor implant.  No obvious pelvic adenopathy, no palpable para-aortic adenopathy. R1 resection at the end of surgery   05/12/2021 Pathology Results   A. UTERUS, CERVIX, BILATERAL FALLOPIAN TUBES AND OVARIES:  - Fallopian tubes:       - Bilateral fallopian tubes involved by high-grade serous  carcinoma.  - Ovaries:       - Bilateral ovaries involved  by high-grade serous carcinoma.  - Uterine cervix:       - Benign transformation zone.       - Negative for squamous intraepithelial lesion and malignancy.  - Endometrium:       - Inactive endometrium.       - Negative for atypical hyperplasia/EIN and malignancy.  - Myometrium:       - Negative for malignancy.  - Uterine serosa:       - Involved by metastatic high-grade serous carcinoma.   B. PERITONEAL IMPLANT, EXCISION:  - Metastatic high-grade serous carcinoma.   C. OMENTUM, RESECTION:  - Metastatic high-grade serous carcinoma, multifocal.   COMMENT:   A. Immunohistochemical studies show tumor cells to be positive for CK7,  Pax-8, and WT-1, and  negative for CK20, PR, and CDX-2. These findings  support the above diagnosis. A p53 shows strong and diffuse staining  within neoplastic cells, indicating mutated p53 status.   OVARY or FALLOPIAN TUBE or PRIMARY PERITONEUM: Resection   Procedure: Total hysterectomy and bilateral salpingo-oophorectomy  Specimen Integrity: Intact  Tumor Site: Favor right fallopian tube  Tumor Size: At least 1.7 cm  Histologic Type: High-grade serous carcinoma  Histologic Grade: High-grade  Ovarian Surface Involvement: Present, right and left  Fallopian Tube Surface Involvement: Present, right and left  Implants (required for advanced stage serous/seromucinous borderline  tumors only): Present: Peritoneal and omental  Other Tissue/ Organ Involvement: Bilateral ovaries, uterine serosa,  omentum, pelvic peritoneum  Largest Extrapelvic Peritoneal Focus: At least 6.7 cm  Peritoneal/Ascitic Fluid Involvement: Not submitted/unknown  Chemotherapy Response Score (CRS): CRS 1 (no definite or minimal  response)  Regional Lymph Nodes: Not applicable (no lymph nodes submitted or found)   Distant Metastasis:       Distant Site(s) Involved: Not applicable  Pathologic Stage Classification (pTNM, AJCC 8th Edition): ypT3c, pN not  assigned  TNM classifiers: y (posttreatment)  Ancillary Studies: Can be performed upon request  Representative Tumor Block: A4  Comment(s): None    06/30/2021 Tumor Marker   Patient's tumor was tested for the following markers: CA-125. Results of the tumor marker test revealed 72.8.   08/11/2021 Tumor Marker   Patient's tumor was tested for the following markers: Ca-125. Results of the tumor marker test revealed 30.7.   09/08/2021 Tumor Marker   Patient's tumor was tested for the following markers: CA-125. Results of the tumor marker test revealed 21.4.   09/09/2021 Imaging   1. No new or progressive metastatic disease in the chest abdomen or pelvis. 2. Continued interval decrease in  omental caking now with minimal residual nodular stranding. 3. Similar to minimally decreased mediastinal adenopathy. 4. Aortic Atherosclerosis (ICD10-I70.0).   10/04/2021 Tumor Marker   Patient's tumor was tested for the following markers: CA-125. Results of the tumor marker test revealed 22.2.   10/08/2021 -  Chemotherapy   She was started on lynparza   10/27/2021 Tumor Marker   Patient's tumor was tested for the following markers: CA-125. Results of the tumor marker test revealed 24.2.   12/06/2021 Tumor Marker   Patient's tumor was tested for the following markers: CA-125. Results of the tumor marker test revealed 25.1.   01/09/2022 Imaging   1. New 0.4 cm fissural nodule of the superior segment left lower lobe, nonspecific although modestly suspicious for pulmonary metastatic disease. Attention on follow-up. 2. No other evidence of new metastatic disease in the chest, abdomen, or pelvis. 3. Minimal residual omental and peritoneal stranding and nodularity, unchanged. 4. No significant  change in enlarged right hilar, pretracheal, and right superior mediastinal lymph nodes. 5. Status post partial right hemicolectomy and reanastomosis. 6. Status post hysterectomy.   Aortic Atherosclerosis (ICD10-I70.0).   01/09/2022 Tumor Marker   Patient's tumor was tested for the following markers: CA-125. Results of the tumor marker test revealed 29.2.   02/13/2022 Tumor Marker   Patient's tumor was tested for the following markers: CA-125. Results of the tumor marker test revealed 39.2.   03/14/2022 Tumor Marker   Patient's tumor was tested for the following markers: CA-125. Results of the tumor marker test revealed 56.6.   03/23/2022 Imaging   1. No definitive findings to suggest metastatic disease to the chest, abdomen or pelvis. 2. Resolution of previously noted perifissural nodule in the superior aspect of the left major fissure. New 3 mm subpleural nodule in the periphery of the left lower  lobe, nonspecific, but statistically likely benign. Attention at time of follow-up imaging is recommended to ensure stability or regression. 3. Aortic atherosclerosis. 4. Additional incidental findings, as above.   04/13/2022 Tumor Marker   Patient's tumor was tested for the following markers: CA-125. Results of the tumor marker test revealed 74.7.   05/15/2022 Tumor Marker   Patient's tumor was tested for the following markers: CA-125. Results of the tumor marker test revealed 98.4.   06/15/2022 Tumor Marker   Patient's tumor was tested for the following markers: CA-125. Results of the tumor marker test revealed 85.9.   08/08/2022 Tumor Marker   Patient's tumor was tested for the following markers: CA-125. Results of the tumor marker test revealed 135.   08/17/2022 Imaging   1. 10 x 17 mm soft tissue structure identified in the aortocaval space. While subtle, this does appear to the enlarging and retroperitoneal lymph node is a distinct concern. There is a slight possibility this could be a duodenal diverticulum, but that possibility is considered less likely. Close follow-up recommended. This structure is large enough that PET-CT may provide helpful additional characterization. 2. Right hemicolectomy. 3.  Aortic Atherosclerosis (ICD10-I70.0).   09/06/2022 PET scan   1. Enlarged and tracer avid high right paratracheal lymph node within the mediastinum compatible with residual/recurrent nodal metastasis. 2. Enlarged and tracer avid retroperitoneal lymph node is also identified compatible with residual/recurrent nodal metastasis. 3. No ascites identified at this time. Small omental nodule measures 6 mm and is too small to characterize by PET-CT. 4. No signs of solid organ or nodal metastasis within the abdomen or pelvis. 5.  Aortic Atherosclerosis (ICD10-I70.0).   10/05/2022 -  Chemotherapy   Patient is on Treatment Plan : OVARIAN Carboplatin (AUC 6) q21d x 6 Cycles     10/06/2022 Tumor  Marker   Patient's tumor was tested for the following markers: CA-125. Results of the tumor marker test revealed 238.     PHYSICAL EXAMINATION: ECOG PERFORMANCE STATUS: 1 - Symptomatic but completely ambulatory  Vitals:   10/26/22 0829  BP: 127/65  Pulse: 63  Resp: 18  Temp: 97.6 F (36.4 C)  SpO2: 100%   Filed Weights   10/26/22 0829  Weight: 163 lb 8 oz (74.2 kg)    GENERAL:alert, no distress and comfortable  NEURO: alert & oriented x 3 with fluent speech, no focal motor/sensory deficits  LABORATORY DATA:  I have reviewed the data as listed    Component Value Date/Time   NA 140 10/26/2022 0809   K 4.9 10/26/2022 0809   CL 109 10/26/2022 0809   CO2 25 10/26/2022 0809  GLUCOSE 85 10/26/2022 0809   BUN 17 10/26/2022 0809   CREATININE 0.76 10/26/2022 0809   CREATININE 0.68 03/14/2016 1350   CALCIUM 9.5 10/26/2022 0809   PROT 7.1 10/26/2022 0809   ALBUMIN 4.0 10/26/2022 0809   AST 20 10/26/2022 0809   ALT 19 10/26/2022 0809   ALKPHOS 83 10/26/2022 0809   BILITOT 0.5 10/26/2022 0809   GFRNONAA >60 10/26/2022 0809   GFRAA >60 03/19/2016 0455    No results found for: "SPEP", "UPEP"  Lab Results  Component Value Date   WBC 3.2 (L) 10/26/2022   NEUTROABS 1.0 (L) 10/26/2022   HGB 11.9 (L) 10/26/2022   HCT 35.6 (L) 10/26/2022   MCV 102.3 (H) 10/26/2022   PLT 198 10/26/2022      Chemistry      Component Value Date/Time   NA 140 10/26/2022 0809   K 4.9 10/26/2022 0809   CL 109 10/26/2022 0809   CO2 25 10/26/2022 0809   BUN 17 10/26/2022 0809   CREATININE 0.76 10/26/2022 0809   CREATININE 0.68 03/14/2016 1350      Component Value Date/Time   CALCIUM 9.5 10/26/2022 0809   ALKPHOS 83 10/26/2022 0809   AST 20 10/26/2022 0809   ALT 19 10/26/2022 0809   BILITOT 0.5 10/26/2022 0809

## 2022-10-26 NOTE — Assessment & Plan Note (Signed)
She had slight worsening fatigue and mental fog with the last treatment She is noted to have significant pancytopenia today We will proceed with treatment with dose reduction I recommend minimum 3-4 cycles of chemotherapy before repeating imaging study for objective assessment of response to therapy

## 2022-10-26 NOTE — Assessment & Plan Note (Signed)
She has slight worsening pancytopenia but not symptomatic We discussed the risk and benefits of proceeding with treatment without delay and she agrees I plan minor dose adjustment of carboplatin She does not need G-CSF support

## 2022-10-26 NOTE — Assessment & Plan Note (Signed)
Recent repeat B12 level is still borderline low We will continue vitamin B12 injection with each dose of treatment

## 2022-10-27 ENCOUNTER — Other Ambulatory Visit: Payer: Self-pay

## 2022-10-28 ENCOUNTER — Other Ambulatory Visit: Payer: Self-pay

## 2022-10-29 ENCOUNTER — Other Ambulatory Visit: Payer: Self-pay

## 2022-10-30 ENCOUNTER — Encounter: Payer: Self-pay | Admitting: Hematology and Oncology

## 2022-10-30 ENCOUNTER — Telehealth: Payer: Self-pay

## 2022-10-30 NOTE — Telephone Encounter (Signed)
Called Pt regarding MyChart message. Pt asking for latest CA 125 result. Gave result to Pt and advised that MD is aware (per last note on 10/26/22) and to continue with plan in place for tx. Pt verbalized understanding.

## 2022-11-15 MED FILL — Fosaprepitant Dimeglumine For IV Infusion 150 MG (Base Eq): INTRAVENOUS | Qty: 5 | Status: AC

## 2022-11-15 MED FILL — Dexamethasone Sodium Phosphate Inj 100 MG/10ML: INTRAMUSCULAR | Qty: 1 | Status: AC

## 2022-11-16 ENCOUNTER — Encounter: Payer: Self-pay | Admitting: Hematology and Oncology

## 2022-11-16 ENCOUNTER — Inpatient Hospital Stay: Payer: Medicare Other

## 2022-11-16 ENCOUNTER — Other Ambulatory Visit: Payer: Self-pay | Admitting: Hematology and Oncology

## 2022-11-16 ENCOUNTER — Inpatient Hospital Stay (HOSPITAL_BASED_OUTPATIENT_CLINIC_OR_DEPARTMENT_OTHER): Payer: Medicare Other | Admitting: Hematology and Oncology

## 2022-11-16 VITALS — BP 132/72 | HR 72 | Temp 97.4°F | Resp 18 | Ht 65.0 in | Wt 160.4 lb

## 2022-11-16 VITALS — BP 149/81 | HR 70

## 2022-11-16 DIAGNOSIS — E538 Deficiency of other specified B group vitamins: Secondary | ICD-10-CM | POA: Diagnosis not present

## 2022-11-16 DIAGNOSIS — C482 Malignant neoplasm of peritoneum, unspecified: Secondary | ICD-10-CM

## 2022-11-16 DIAGNOSIS — D61818 Other pancytopenia: Secondary | ICD-10-CM

## 2022-11-16 DIAGNOSIS — I700 Atherosclerosis of aorta: Secondary | ICD-10-CM | POA: Diagnosis not present

## 2022-11-16 DIAGNOSIS — Z5111 Encounter for antineoplastic chemotherapy: Secondary | ICD-10-CM | POA: Diagnosis not present

## 2022-11-16 DIAGNOSIS — C786 Secondary malignant neoplasm of retroperitoneum and peritoneum: Secondary | ICD-10-CM | POA: Diagnosis not present

## 2022-11-16 DIAGNOSIS — C5701 Malignant neoplasm of right fallopian tube: Secondary | ICD-10-CM

## 2022-11-16 DIAGNOSIS — Z79899 Other long term (current) drug therapy: Secondary | ICD-10-CM | POA: Diagnosis not present

## 2022-11-16 DIAGNOSIS — K5900 Constipation, unspecified: Secondary | ICD-10-CM | POA: Diagnosis not present

## 2022-11-16 DIAGNOSIS — Z79890 Hormone replacement therapy (postmenopausal): Secondary | ICD-10-CM | POA: Diagnosis not present

## 2022-11-16 DIAGNOSIS — R5383 Other fatigue: Secondary | ICD-10-CM | POA: Diagnosis not present

## 2022-11-16 DIAGNOSIS — Z85038 Personal history of other malignant neoplasm of large intestine: Secondary | ICD-10-CM

## 2022-11-16 LAB — CMP (CANCER CENTER ONLY)
ALT: 15 U/L (ref 0–44)
AST: 17 U/L (ref 15–41)
Albumin: 4 g/dL (ref 3.5–5.0)
Alkaline Phosphatase: 80 U/L (ref 38–126)
Anion gap: 9 (ref 5–15)
BUN: 16 mg/dL (ref 8–23)
CO2: 22 mmol/L (ref 22–32)
Calcium: 9.5 mg/dL (ref 8.9–10.3)
Chloride: 108 mmol/L (ref 98–111)
Creatinine: 0.72 mg/dL (ref 0.44–1.00)
GFR, Estimated: >60 mL/min (ref >60)
Glucose, Bld: 90 mg/dL (ref 70–99)
Potassium: 4.1 mmol/L (ref 3.5–5.1)
Sodium: 139 mmol/L (ref 135–145)
Total Bilirubin: 0.6 mg/dL (ref 0.3–1.2)
Total Protein: 7.2 g/dL (ref 6.5–8.1)

## 2022-11-16 LAB — CBC WITH DIFFERENTIAL (CANCER CENTER ONLY)
Abs Immature Granulocytes: 0.01 10*3/uL (ref 0.00–0.07)
Basophils Absolute: 0 10*3/uL (ref 0.0–0.1)
Basophils Relative: 0 %
Eosinophils Absolute: 0 10*3/uL (ref 0.0–0.5)
Eosinophils Relative: 0 %
HCT: 34.8 % — ABNORMAL LOW (ref 36.0–46.0)
Hemoglobin: 11.9 g/dL — ABNORMAL LOW (ref 12.0–15.0)
Immature Granulocytes: 0 %
Lymphocytes Relative: 56 %
Lymphs Abs: 1.6 10*3/uL (ref 0.7–4.0)
MCH: 34.3 pg — ABNORMAL HIGH (ref 26.0–34.0)
MCHC: 34.2 g/dL (ref 30.0–36.0)
MCV: 100.3 fL — ABNORMAL HIGH (ref 80.0–100.0)
Monocytes Absolute: 0.3 10*3/uL (ref 0.1–1.0)
Monocytes Relative: 11 %
Neutro Abs: 1 10*3/uL — ABNORMAL LOW (ref 1.7–7.7)
Neutrophils Relative %: 33 %
Platelet Count: 199 10*3/uL (ref 150–400)
RBC: 3.47 MIL/uL — ABNORMAL LOW (ref 3.87–5.11)
RDW: 15.3 % (ref 11.5–15.5)
WBC Count: 3 10*3/uL — ABNORMAL LOW (ref 4.0–10.5)
nRBC: 0 % (ref 0.0–0.2)

## 2022-11-16 LAB — VITAMIN B12: Vitamin B-12: 492 pg/mL (ref 180–914)

## 2022-11-16 MED ORDER — PALONOSETRON HCL INJECTION 0.25 MG/5ML
0.2500 mg | Freq: Once | INTRAVENOUS | Status: AC
  Administered 2022-11-16: 0.25 mg via INTRAVENOUS
  Filled 2022-11-16: qty 5

## 2022-11-16 MED ORDER — SODIUM CHLORIDE 0.9 % IV SOLN: Freq: Once | INTRAVENOUS | Status: AC

## 2022-11-16 MED ORDER — SODIUM CHLORIDE 0.9 % IV SOLN
390.0000 mg | Freq: Once | INTRAVENOUS | Status: AC
  Administered 2022-11-16: 390 mg via INTRAVENOUS
  Filled 2022-11-16: qty 39

## 2022-11-16 MED ORDER — MONTELUKAST SODIUM 10 MG PO TABS
10.0000 mg | ORAL_TABLET | Freq: Once | ORAL | Status: AC
  Administered 2022-11-16: 10 mg via ORAL
  Filled 2022-11-16: qty 1

## 2022-11-16 MED ORDER — ACETAMINOPHEN 325 MG PO TABS
650.0000 mg | ORAL_TABLET | Freq: Four times a day (QID) | ORAL | Status: DC | PRN
  Administered 2022-11-16: 650 mg via ORAL

## 2022-11-16 MED ORDER — SODIUM CHLORIDE 0.9 % IV SOLN: Freq: Once | INTRAVENOUS | Status: DC | PRN

## 2022-11-16 MED ORDER — FAMOTIDINE 20 MG IN NS 100 ML IVPB
20.0000 mg | Freq: Once | INTRAVENOUS | Status: AC
  Administered 2022-11-16: 20 mg via INTRAVENOUS
  Filled 2022-11-16: qty 100

## 2022-11-16 MED ORDER — SODIUM CHLORIDE 0.9 % IV SOLN
10.0000 mg | Freq: Once | INTRAVENOUS | Status: AC
  Administered 2022-11-16: 10 mg via INTRAVENOUS
  Filled 2022-11-16: qty 10

## 2022-11-16 MED ORDER — SODIUM CHLORIDE 0.9 % IV SOLN
150.0000 mg | Freq: Once | INTRAVENOUS | Status: AC
  Administered 2022-11-16: 150 mg via INTRAVENOUS
  Filled 2022-11-16: qty 150

## 2022-11-16 MED ORDER — ESCITALOPRAM OXALATE 10 MG PO TABS: ORAL_TABLET | ORAL | 6 refills | Status: DC

## 2022-11-16 MED ORDER — DIPHENHYDRAMINE HCL 50 MG/ML IJ SOLN
50.0000 mg | Freq: Once | INTRAMUSCULAR | Status: AC | PRN
  Administered 2022-11-16: 25 mg via INTRAVENOUS

## 2022-11-16 NOTE — Progress Notes (Addendum)
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[facility]) She had slight worsening fatigue and mild constipation that resolved with conservative approach She is noted to have stable pancytopenia We will proceed with treatment without delay I have ordered repeat B12 level, result pending I recommend minimum 3-4 cycles of chemotherapy before repeating imaging study for objective assessment of response to therapy  Addendum at 2:00: Unfortunately, the patient developed allergic reaction to carboplatin midway through treatment with significant flushing and some mild headaches, resolved with conservative management with additional premedications.  We discussed implications of carboplatin allergy I will discontinue her treatment plan.  We will follow on her symptoms tomorrow and call her.  I will schedule CT imaging to be done next month   Vitamin B12 deficiency Recent repeat B12 level is still borderline low We will continue vitamin B12 injection monthly and I plan to recheck her blood test again today  Pancytopenia, acquired [facility]) She has stable pancytopenia but not symptomatic We will proceed with treatment without delay  Orders Placed This Encounter  Procedures   CT CHEST ABDOMEN PELVIS W CONTRAST    Standing Status:   Future    Standing Expiration Date:   11/16/2023    Order Specific Question:   If indicated for the ordered procedure, I authorize the administration of contrast media per Radiology protocol    Answer:   Yes    Order Specific Question:   Does the patient have a contrast media/X-ray dye allergy?    Answer:   No    Order Specific Question:   Preferred imaging location?    Answer:   [facility]    Order  Specific Question:   If indicated for the ordered procedure, I authorize the administration of oral contrast media per Radiology protocol    Answer:   Yes    All questions were answered. The patient knows to call the clinic with any problems, questions or concerns. The total time spent in the appointment was 40 minutes encounter with patients including review of chart and various tests results, discussions about plan of care and coordination of care plan   Artis Delay, MD 11/16/2022 2:28 PM  INTERVAL HISTORY: Please see below for problem oriented charting. she returns for treatment follow-up with her sister She tolerated last cycle of treatment well except for slight constipation that resolved quickly and fatigue that lasted for about a week Denies nausea No recent infection We discussed timing of next imaging study  REVIEW OF SYSTEMS:   Constitutional: Denies fevers, chills or abnormal weight loss Eyes: Denies blurriness of vision Ears, nose, mouth, throat, and face: Denies mucositis or sore throat Respiratory: Denies cough, dyspnea or wheezes Cardiovascular: Denies palpitation, chest discomfort or lower extremity swelling Skin: Denies abnormal skin rashes Lymphatics: Denies new lymphadenopathy or easy bruising Neurological:Denies numbness, tingling or new weaknesses Behavioral/Psych: Mood is stable, no new changes  All other systems were reviewed with the patient and are negative.  I have reviewed the past medical history, past surgical history, social history and family history with the patient and they are unchanged from previous note.  ALLERGIES:  is allergic to barium-containing compounds, carboplatin, hydrocodone, erythromycin, and zofran [ondansetron].  MEDICATIONS:  Current Outpatient Medications  Medication Sig Dispense Refill   Calcium Carb-Cholecalciferol (CALCIUM  600+D3 PO) Take 1 tablet by mouth in the morning and at bedtime.     cetirizine (ZYRTEC) 10 MG tablet Take  10 mg by mouth every 30 (thirty) days. As needed     cholecalciferol (VITAMIN D3) 25 MCG (1000 UNIT) tablet Take 5,000 Units by mouth in the morning.     diazepam (VALIUM) 5 MG tablet Take 1 tablet (5 mg total) by mouth daily as needed for anxiety. 30 tablet    escitalopram (LEXAPRO) 10 MG tablet TAKE 1 & 1/2 (ONE & ONE-HALF) TABLETS BY MOUTH ONCE DAILY 45 tablet 6   Ginger-Calcium Carbonate (DRAMAMINE GINGER CHEWS PO) Take by mouth.     Homeopathic Products (T-RELIEF ARNICA + 12) CREA Apply topically.     levothyroxine (SYNTHROID, LEVOTHROID) 75 MCG tablet Take 75 mcg by mouth daily before breakfast.  3   ondansetron (ZOFRAN) 8 MG tablet Take 1 tablet (8 mg total) by mouth every 8 (eight) hours as needed for refractory nausea / vomiting. (Patient not taking: Reported on 12/05/2021) 30 tablet 1   oxyCODONE-acetaminophen (PERCOCET/ROXICET) 5-325 MG tablet Take 1 tablet by mouth every 6 (six) hours as needed for severe pain. 60 tablet 0   Polyethyl Glycol-Propyl Glycol (SYSTANE) 0.4-0.3 % SOLN Place 1-2 drops into both eyes 3 (three) times daily as needed (dry/irritated eyes.).     prochlorperazine (COMPAZINE) 10 MG tablet Take 1 tablet (10 mg total) by mouth every 6 (six) hours as needed (Nausea or vomiting). (Patient not taking: Reported on 12/05/2021) 30 tablet 1   SUNSCREEN SPF50 EX      valACYclovir (VALTREX) 1000 MG tablet Take 1 tablet (1,000 mg total) by mouth daily as needed (Fever Blister).     zolpidem (AMBIEN) 10 MG tablet Take 1 tablet (10 mg total) by mouth at bedtime as needed. for sleep 30 tablet 2   No current facility-administered medications for this visit.   Facility-Administered Medications Ordered in Other Visits  Medication Dose Route Frequency Provider Last Rate Last Admin   acetaminophen (TYLENOL) tablet 650 mg  650 mg Oral Q6H PRN Bertis Ruddy, Anja Neuzil, MD   650 mg at 11/16/22 1254    SUMMARY OF ONCOLOGIC HISTORY: Oncology History Overview Note  Normal MMR HRD positive   Cancer  of right fallopian tube (HCC)  01/05/2021 Pathology Results   SURGICAL PATHOLOGY  CASE: [phone]  PATIENT: [facility]  Surgical Pathology Report   Reason for Addendum #1:  Immunohistochemistry results  Reason for Addendum #2:  DNA Mismatch Repair IHC Results   Clinical History: history of colon cancer, now with omental thickening  (cm)   FINAL MICROSCOPIC DIAGNOSIS:   A. OMENTUM, NEEDLE CORE BIOPSY:  - Metastatic poorly differentiated adenocarcinoma.  See comment   COMMENT:   Immunohistochemical stains show that the tumor cells are positive for CK7 and negative for CDX2 and CK20.  This immunoprofile is nonspecific and differential diagnosis can include an upper gastrointestinal, breast and lung primary among other possibilities.  Only a very small fraction of primary colonic adenocarcinoma was immunoprofile.  Clinical and radiologic correlation suggested.   Additional immunohistochemical stains are performed and show that the tumor cells are positive for WT1, PAX8 and ER.  Immunostain for p53 shows a clonal overexpression pattern.  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  Clinical and radiologic correlation is suggested.    01/19/2021 Tumor Marker   Patient's tumor was tested for the following markers: CA-125. Results of the tumor marker  test revealed 6790.   01/28/2021 PET scan   1. Examination is positive for extensive FDG avid peritoneal disease within the abdomen and pelvis including multiple implants upon the surface of liver and extensive omental caking. Moderate ascites noted within the pelvis. 2. FDG avid retroperitoneal, retrocrural, right internal mammary, and mediastinal lymph nodes compatible with metastatic adenopathy. 3. Small right pleural effusion. 4.  Aortic Atherosclerosis (ICD10-I70.0).     01/31/2021 Initial Diagnosis   Primary peritoneal carcinomatosis (HCC)   01/31/2021 Cancer  Staging   Staging form: Ovary, Fallopian Tube, and Primary Peritoneal Carcinoma, AJCC 8th Edition - Clinical stage from 01/31/2021: FIGO Stage IV (cT2b, cN1b, cM1) - Signed by Artis Delay, MD on 01/31/2021 Stage prefix: Initial diagnosis   02/14/2021 Tumor Marker   Patient's tumor was tested for the following markers: CA-125. Results of the tumor marker test revealed 8668.   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 Remainder of testing was negative/normal. The report date is 02/09/2021.  The CancerNext-Expanded + RNAinsight gene panel offered by [employer] and includes sequencing and rearrangement analysis for the following 77 genes: IP, ALK, APC*, ATM*, AXIN2, BAP1, BARD1, BLM, BMPR1A, BRCA1*, BRCA2*, BRIP1*, CDC73, CDH1*,CDK4, CDKN1B, CDKN2A, CHEK2*, CTNNA1, DICER1, FANCC, FH, FLCN, GALNT12, KIF1B, LZTR1, MAX, MEN1, MET, MLH1*, MSH2*, MSH3, MSH6*, MUTYH*, NBN, NF1*, NF2, NTHL1, PALB2*, PHOX2B, PMS2*, POT1, PRKAR1A, PTCH1, PTEN*, RAD51C*, RAD51D*,RB1, RECQL, RET, SDHA, SDHAF2, SDHB, SDHC, SDHD, SMAD4, SMARCA4, SMARCB1, SMARCE1, STK11, SUFU, TMEM127, TP53*,TSC1, TSC2, VHL and XRCC2 (sequencing and deletion/duplication); EGFR, EGLN1, HOXB13, KIT, MITF, PDGFRA, POLD1 and POLE (sequencing only); EPCAM and GREM1 (deletion/duplication only).   03/10/2021 Tumor Marker   Patient's tumor was tested for the following markers: CA-125. Results of the tumor marker test revealed 2002.   04/14/2021 Tumor Marker   Patient's tumor was tested for the following markers: CA-125. Results of the tumor marker test revealed 278.   04/26/2021 Imaging   Significant decrease in peritoneal carcinomatosis since previous study, with resolution of ascites.   Decreased anterior mediastinal and retrocrural lymphadenopathy.   No new or progressive metastatic  disease within the chest, abdomen, or pelvis.   Aortic Atherosclerosis (ICD10-I70.0).   04/29/2021 Tumor Marker   Patient's tumor was tested for the following markers: CA-125. Results of the tumor marker test revealed 134.   05/12/2021 Surgery   Tumor debulking including robotic-assisted laparoscopic total hysterectomy with bilateral salpingoophorectomy, lysis of adhesions, excision and fulguration of peritoneal nodules, mini laparotomy for intra-abdominal palpation and omentectomy  Findings: : On exam under anesthesia, small mobile uterus.  On intra-abdominal entry, normal upper abdominal survey including liver and stomach.  Right diaphragm with small peritoneal studding, all lesions less than 5 mm.  Omentum with an approximately 5 cm mass adherent to the lower anterior abdominal wall and bladder peritoneum.  Normal 6 cm uterus.  Somewhat atrophic appearing bilateral adnexa with tumor implants.  Sigmoid and rectal colon and mesentery with moderate peritoneal disease, all lesions less than 5 mm.  Multiple peritoneal implants on bilateral pelvic sidewalls and in the posterior cul-de-sac, all measuring less than 5 mm.  Multiple omental implants measuring several mm.  Approximately 1 cm area within the transverse colon mesentery, unclear if this is related to scar tissue from prior colon resection versus tumor implant.  No obvious pelvic adenopathy, no palpable para-aortic adenopathy. R1 resection at  the end of surgery   05/12/2021 Pathology Results   A. UTERUS, CERVIX, BILATERAL FALLOPIAN TUBES AND OVARIES:  - Fallopian tubes:       - Bilateral fallopian tubes involved by high-grade serous  carcinoma.  - Ovaries:       - Bilateral ovaries involved by high-grade serous carcinoma.  - Uterine cervix:       - Benign transformation zone.       - Negative for squamous intraepithelial lesion and malignancy.  - Endometrium:       - Inactive endometrium.       - Negative for atypical hyperplasia/EIN  and malignancy.  - Myometrium:       - Negative for malignancy.  - Uterine serosa:       - Involved by metastatic high-grade serous carcinoma.   B. PERITONEAL IMPLANT, EXCISION:  - Metastatic high-grade serous carcinoma.   C. OMENTUM, RESECTION:  - Metastatic high-grade serous carcinoma, multifocal.   COMMENT:   A. Immunohistochemical studies show tumor cells to be positive for CK7,  Pax-8, and WT-1, and negative for CK20, PR, and CDX-2. These findings  support the above diagnosis. A p53 shows strong and diffuse staining  within neoplastic cells, indicating mutated p53 status.   OVARY or FALLOPIAN TUBE or PRIMARY PERITONEUM: Resection   Procedure: Total hysterectomy and bilateral salpingo-oophorectomy  Specimen Integrity: Intact  Tumor Site: Favor right fallopian tube  Tumor Size: At least 1.7 cm  Histologic Type: High-grade serous carcinoma  Histologic Grade: High-grade  Ovarian Surface Involvement: Present, right and left  Fallopian Tube Surface Involvement: Present, right and left  Implants (required for advanced stage serous/seromucinous borderline  tumors only): Present: Peritoneal and omental  Other Tissue/ Organ Involvement: Bilateral ovaries, uterine serosa,  omentum, pelvic peritoneum  Largest Extrapelvic Peritoneal Focus: At least 6.7 cm  Peritoneal/Ascitic Fluid Involvement: Not submitted/unknown  Chemotherapy Response Score (CRS): CRS 1 (no definite or minimal  response)  Regional Lymph Nodes: Not applicable (no lymph nodes submitted or found)   Distant Metastasis:       Distant Site(s) Involved: Not applicable  Pathologic Stage Classification (pTNM, AJCC 8th Edition): ypT3c, pN not  assigned  TNM classifiers: y (posttreatment)  Ancillary Studies: Can be performed upon request  Representative Tumor Block: A4  Comment(s): None    06/30/2021 Tumor Marker   Patient's tumor was tested for the following markers: CA-125. Results of the tumor marker test revealed  72.8.   08/11/2021 Tumor Marker   Patient's tumor was tested for the following markers: Ca-125. Results of the tumor marker test revealed 30.7.   09/08/2021 Tumor Marker   Patient's tumor was tested for the following markers: CA-125. Results of the tumor marker test revealed 21.4.   09/09/2021 Imaging   1. No new or progressive metastatic disease in the chest abdomen or pelvis. 2. Continued interval decrease in omental caking now with minimal residual nodular stranding. 3. Similar to minimally decreased mediastinal adenopathy. 4. Aortic Atherosclerosis (ICD10-I70.0).   10/04/2021 Tumor Marker   Patient's tumor was tested for the following markers: CA-125. Results of the tumor marker test revealed 22.2.   10/08/2021 -  Chemotherapy   She was started on lynparza   10/27/2021 Tumor Marker   Patient's tumor was tested for the following markers: CA-125. Results of the tumor marker test revealed 24.2.   12/06/2021 Tumor Marker   Patient's tumor was tested for the following markers: CA-125. Results of the tumor marker test revealed 25.1.   01/09/2022 Imaging  1. New 0.4 cm fissural nodule of the superior segment left lower lobe, nonspecific although modestly suspicious for pulmonary metastatic disease. Attention on follow-up. 2. No other evidence of new metastatic disease in the chest, abdomen, or pelvis. 3. Minimal residual omental and peritoneal stranding and nodularity, unchanged. 4. No significant change in enlarged right hilar, pretracheal, and right superior mediastinal lymph nodes. 5. Status post partial right hemicolectomy and reanastomosis. 6. Status post hysterectomy.   Aortic Atherosclerosis (ICD10-I70.0).   01/09/2022 Tumor Marker   Patient's tumor was tested for the following markers: CA-125. Results of the tumor marker test revealed 29.2.   02/13/2022 Tumor Marker   Patient's tumor was tested for the following markers: CA-125. Results of the tumor marker test revealed  39.2.   03/14/2022 Tumor Marker   Patient's tumor was tested for the following markers: CA-125. Results of the tumor marker test revealed 56.6.   03/23/2022 Imaging   1. No definitive findings to suggest metastatic disease to the chest, abdomen or pelvis. 2. Resolution of previously noted perifissural nodule in the superior aspect of the left major fissure. New 3 mm subpleural nodule in the periphery of the left lower lobe, nonspecific, but statistically likely benign. Attention at time of follow-up imaging is recommended to ensure stability or regression. 3. Aortic atherosclerosis. 4. Additional incidental findings, as above.   04/13/2022 Tumor Marker   Patient's tumor was tested for the following markers: CA-125. Results of the tumor marker test revealed 74.7.   05/15/2022 Tumor Marker   Patient's tumor was tested for the following markers: CA-125. Results of the tumor marker test revealed 98.4.   06/15/2022 Tumor Marker   Patient's tumor was tested for the following markers: CA-125. Results of the tumor marker test revealed 85.9.   08/08/2022 Tumor Marker   Patient's tumor was tested for the following markers: CA-125. Results of the tumor marker test revealed 135.   08/17/2022 Imaging   1. 10 x 17 mm soft tissue structure identified in the aortocaval space. While subtle, this does appear to the enlarging and retroperitoneal lymph node is a distinct concern. There is a slight possibility this could be a duodenal diverticulum, but that possibility is considered less likely. Close follow-up recommended. This structure is large enough that PET-CT may provide helpful additional characterization. 2. Right hemicolectomy. 3.  Aortic Atherosclerosis (ICD10-I70.0).   09/06/2022 PET scan   1. Enlarged and tracer avid high right paratracheal lymph node within the mediastinum compatible with residual/recurrent nodal metastasis. 2. Enlarged and tracer avid retroperitoneal lymph node is also  identified compatible with residual/recurrent nodal metastasis. 3. No ascites identified at this time. Small omental nodule measures 6 mm and is too small to characterize by PET-CT. 4. No signs of solid organ or nodal metastasis within the abdomen or pelvis. 5.  Aortic Atherosclerosis (ICD10-I70.0).   10/05/2022 - 11/16/2022 Chemotherapy   Patient is on Treatment Plan : OVARIAN Carboplatin (AUC 6) q21d x 6 Cycles     10/06/2022 Tumor Marker   Patient's tumor was tested for the following markers: CA-125. Results of the tumor marker test revealed 238.     PHYSICAL EXAMINATION: ECOG PERFORMANCE STATUS: 1 - Symptomatic but completely ambulatory  Vitals:   11/16/22 0959  BP: 132/72  Pulse: 72  Resp: 18  Temp: (!) 97.4 F (36.3 C)  SpO2: 98%   Filed Weights   11/16/22 0959  Weight: 160 lb 6.4 oz (72.8 kg)    GENERAL:alert, no distress and comfortable NEURO: alert & oriented x  3 with fluent speech, no focal motor/sensory deficits  LABORATORY DATA:  I have reviewed the data as listed    Component Value Date/Time   NA 139 11/16/2022 0943   K 4.1 11/16/2022 0943   CL 108 11/16/2022 0943   CO2 22 11/16/2022 0943   GLUCOSE 90 11/16/2022 0943   BUN 16 11/16/2022 0943   CREATININE 0.72 11/16/2022 0943   CREATININE 0.68 03/14/2016 1350   CALCIUM 9.5 11/16/2022 0943   PROT 7.2 11/16/2022 0943   ALBUMIN 4.0 11/16/2022 0943   AST 17 11/16/2022 0943   ALT 15 11/16/2022 0943   ALKPHOS 80 11/16/2022 0943   BILITOT 0.6 11/16/2022 0943   GFRNONAA >60 11/16/2022 0943   GFRAA >60 03/19/2016 0455    No results found for: "SPEP", "UPEP"  Lab Results  Component Value Date   WBC 3.0 (L) 11/16/2022   NEUTROABS 1.0 (L) 11/16/2022   HGB 11.9 (L) 11/16/2022   HCT 34.8 (L) 11/16/2022   MCV 100.3 (H) 11/16/2022   PLT 199 11/16/2022      Chemistry      Component Value Date/Time   NA 139 11/16/2022 0943   K 4.1 11/16/2022 0943   CL 108 11/16/2022 0943   CO2 22 11/16/2022 0943    BUN 16 11/16/2022 0943   CREATININE 0.72 11/16/2022 0943   CREATININE 0.68 03/14/2016 1350      Component Value Date/Time   CALCIUM 9.5 11/16/2022 0943   ALKPHOS 80 11/16/2022 0943   AST 17 11/16/2022 0943   ALT 15 11/16/2022 0943   BILITOT 0.6 11/16/2022 1610

## 2022-11-16 NOTE — Assessment & Plan Note (Signed)
She has stable pancytopenia but not symptomatic We will proceed with treatment without delay

## 2022-11-16 NOTE — Assessment & Plan Note (Addendum)
She had slight worsening fatigue and mild constipation that resolved with conservative approach She is noted to have stable pancytopenia We will proceed with treatment without delay I have ordered repeat B12 level, result pending I recommend minimum 3-4 cycles of chemotherapy before repeating imaging study for objective assessment of response to therapy  Addendum at 2:00: Unfortunately, the patient developed allergic reaction to carboplatin midway through treatment with significant flushing and some mild headaches, resolved with conservative management with additional premedications.  We discussed implications of carboplatin allergy I will discontinue her treatment plan.  We will follow on her symptoms tomorrow and call her.  I will schedule CT imaging to be done next month

## 2022-11-16 NOTE — Patient Instructions (Signed)
[facility]  Discharge Instructions: Thank you for choosing [facility] to provide your oncology and hematology care.   If you have a lab appointment with the Cancer Center, please go directly to the Cancer Center and check in at the registration area.   Wear comfortable clothing and clothing appropriate for easy access to any Portacath or PICC line.   We strive to give you quality time with your provider. You may need to reschedule your appointment if you arrive late (15 or more minutes).  Arriving late affects you and other patients whose appointments are after yours.  Also, if you miss three or more appointments without notifying the office, you may be dismissed from the clinic at the provider's discretion.      For prescription refill requests, have your pharmacy contact our office and allow 72 hours for refills to be completed.    Today you received the following chemotherapy and/or immunotherapy agents Carboplatin.    To help prevent nausea and vomiting after your treatment, we encourage you to take your nausea medication as directed.  BELOW ARE SYMPTOMS THAT SHOULD BE REPORTED IMMEDIATELY: *FEVER GREATER THAN 100.4 F (38 C) OR HIGHER *CHILLS OR SWEATING *NAUSEA AND VOMITING THAT IS NOT CONTROLLED WITH YOUR NAUSEA MEDICATION *UNUSUAL SHORTNESS OF BREATH *UNUSUAL BRUISING OR BLEEDING *URINARY PROBLEMS (pain or burning when urinating, or frequent urination) *BOWEL PROBLEMS (unusual diarrhea, constipation, pain near the anus) TENDERNESS IN MOUTH AND THROAT WITH OR WITHOUT PRESENCE OF ULCERS (sore throat, sores in mouth, or a toothache) UNUSUAL RASH, SWELLING OR PAIN  UNUSUAL VAGINAL DISCHARGE OR ITCHING   Items with * indicate a potential emergency and should be followed up as soon as possible or go to the Emergency Department if any problems should occur.  Please show the CHEMOTHERAPY ALERT CARD or IMMUNOTHERAPY ALERT CARD at  check-in to the Emergency Department and triage nurse.  Should you have questions after your visit or need to cancel or reschedule your appointment, please contact [facility]  Dept: [phone]  and follow the prompts.  Office hours are 8:00 a.m. to 4:30 p.m. Monday - Friday. Please note that voicemails left after 4:00 p.m. may not be returned until the following business day.  We are closed weekends and major holidays. You have access to a nurse at all times for urgent questions. Please call the main number to the clinic Dept: [phone] and follow the prompts.   For any non-urgent questions, you may also contact your provider using MyChart. We now offer e-Visits for anyone 18 and older to request care online for non-urgent symptoms. For details visit [email or web address].   Also download the MyChart app! Go to the app store, search "MyChart", open the app, select [facility], and log in with your MyChart username and password.   

## 2022-11-16 NOTE — Assessment & Plan Note (Signed)
Recent repeat B12 level is still borderline low We will continue vitamin B12 injection monthly and I plan to recheck her blood test again today

## 2022-11-16 NOTE — Addendum Note (Signed)
Addended byBertis Ruddy, Jaykub Mackins on: 11/16/2022 02:29 PM   Modules accepted: Orders, Level of Service

## 2022-11-16 NOTE — Progress Notes (Signed)
12:52pm Patient almost completed carboplatin infusion when she stated she did not feel well at all. She felt nauseated, headache and felt her heart rate increasing and was flushing. Infusion stopped, reaction protocol started, Dr. Bertis Ruddy called to chairside. See MAR and Flowsheet for medication administration and vitals.   1310- Patient reported complete resolution of symptoms other than headache- 3/10 pain compared to 7/10 when it first started. She elected to continue observation a little longer. Face still  flushed. Vitals returned to baseline.

## 2022-11-17 ENCOUNTER — Telehealth: Payer: Self-pay

## 2022-11-17 ENCOUNTER — Other Ambulatory Visit: Payer: Self-pay | Admitting: Hematology and Oncology

## 2022-11-17 DIAGNOSIS — C5701 Malignant neoplasm of right fallopian tube: Secondary | ICD-10-CM

## 2022-11-17 DIAGNOSIS — E538 Deficiency of other specified B group vitamins: Secondary | ICD-10-CM

## 2022-11-17 DIAGNOSIS — D61818 Other pancytopenia: Secondary | ICD-10-CM

## 2022-11-17 LAB — CA 125: Cancer Antigen (CA) 125: 80.3 U/mL — ABNORMAL HIGH (ref 0.0–38.1)

## 2022-11-17 NOTE — Telephone Encounter (Signed)
-----   Message from Artis Delay, MD sent at 11/17/2022  9:39 AM EDT ----- She had infusion reaction yesterday Can you call and ask how she is doing? I cancelled next month's treatment Can you give her info to call to schedule CT on 6/21 and schedule labs on 6/21, see me 6/25?

## 2022-11-17 NOTE — Telephone Encounter (Signed)
Pt returned call regarding infusion reaction yesterday. Pt reports she is feeling well and was appreciative of the care she received post-reaction. Pt states "everyone, including Dr. Bertis Ruddy, are angels on this [facility]". Pt verbalized understanding of upcoming appts.

## 2022-11-17 NOTE — Telephone Encounter (Signed)
Called and LVM with below question. Pt scheduled for labs/CT on 12/15/22 and MD on 12/19/22. Asked for call back.

## 2022-11-28 ENCOUNTER — Ambulatory Visit: Payer: Medicare Other | Admitting: Pharmacist

## 2022-11-28 ENCOUNTER — Ambulatory Visit: Payer: Medicare Other

## 2022-11-28 ENCOUNTER — Other Ambulatory Visit: Payer: Medicare Other

## 2022-12-08 ENCOUNTER — Inpatient Hospital Stay: Payer: Medicare Other

## 2022-12-08 ENCOUNTER — Inpatient Hospital Stay: Payer: Medicare Other | Admitting: Hematology and Oncology

## 2022-12-15 ENCOUNTER — Ambulatory Visit (HOSPITAL_COMMUNITY)
Admission: RE | Admit: 2022-12-15 | Discharge: 2022-12-15 | Disposition: A | Discharge status: Home / Self Care | Payer: Medicare Other | Source: Ambulatory Visit | Attending: Hematology and Oncology | Admitting: Hematology and Oncology

## 2022-12-15 ENCOUNTER — Other Ambulatory Visit: Payer: Self-pay

## 2022-12-15 ENCOUNTER — Inpatient Hospital Stay: Payer: Medicare Other | Attending: Genetic Counselor

## 2022-12-15 ENCOUNTER — Encounter (HOSPITAL_COMMUNITY): Payer: Self-pay

## 2022-12-15 DIAGNOSIS — C5701 Malignant neoplasm of right fallopian tube: Secondary | ICD-10-CM

## 2022-12-15 DIAGNOSIS — Z79890 Hormone replacement therapy (postmenopausal): Secondary | ICD-10-CM | POA: Insufficient documentation

## 2022-12-15 DIAGNOSIS — E538 Deficiency of other specified B group vitamins: Secondary | ICD-10-CM | POA: Diagnosis not present

## 2022-12-15 DIAGNOSIS — Z79899 Other long term (current) drug therapy: Secondary | ICD-10-CM | POA: Insufficient documentation

## 2022-12-15 DIAGNOSIS — Z85038 Personal history of other malignant neoplasm of large intestine: Secondary | ICD-10-CM | POA: Insufficient documentation

## 2022-12-15 DIAGNOSIS — D61818 Other pancytopenia: Secondary | ICD-10-CM | POA: Diagnosis not present

## 2022-12-15 DIAGNOSIS — C969 Malignant neoplasm of lymphoid, hematopoietic and related tissue, unspecified: Secondary | ICD-10-CM | POA: Diagnosis not present

## 2022-12-15 DIAGNOSIS — C482 Malignant neoplasm of peritoneum, unspecified: Secondary | ICD-10-CM

## 2022-12-15 DIAGNOSIS — Z9221 Personal history of antineoplastic chemotherapy: Secondary | ICD-10-CM | POA: Insufficient documentation

## 2022-12-15 DIAGNOSIS — I700 Atherosclerosis of aorta: Secondary | ICD-10-CM | POA: Diagnosis not present

## 2022-12-15 LAB — CBC WITH DIFFERENTIAL/PLATELET
Abs Immature Granulocytes: 0.01 10*3/uL (ref 0.00–0.07)
Basophils Absolute: 0 10*3/uL (ref 0.0–0.1)
Basophils Relative: 1 %
Eosinophils Absolute: 0 10*3/uL (ref 0.0–0.5)
Eosinophils Relative: 1 %
HCT: 37.2 % (ref 36.0–46.0)
Hemoglobin: 12.8 g/dL (ref 12.0–15.0)
Immature Granulocytes: 0 %
Lymphocytes Relative: 51 %
Lymphs Abs: 2 10*3/uL (ref 0.7–4.0)
MCH: 35.2 pg — ABNORMAL HIGH (ref 26.0–34.0)
MCHC: 34.4 g/dL (ref 30.0–36.0)
MCV: 102.2 fL — ABNORMAL HIGH (ref 80.0–100.0)
Monocytes Absolute: 0.4 10*3/uL (ref 0.1–1.0)
Monocytes Relative: 11 %
Neutro Abs: 1.4 10*3/uL — ABNORMAL LOW (ref 1.7–7.7)
Neutrophils Relative %: 36 %
Platelets: 249 10*3/uL (ref 150–400)
RBC: 3.64 MIL/uL — ABNORMAL LOW (ref 3.87–5.11)
RDW: 14.6 % (ref 11.5–15.5)
WBC: 3.9 10*3/uL — ABNORMAL LOW (ref 4.0–10.5)
nRBC: 0 % (ref 0.0–0.2)

## 2022-12-15 LAB — COMPREHENSIVE METABOLIC PANEL
ALT: 15 U/L (ref 0–44)
AST: 16 U/L (ref 15–41)
Albumin: 3.9 g/dL (ref 3.5–5.0)
Alkaline Phosphatase: 77 U/L (ref 38–126)
Anion gap: 11 (ref 5–15)
BUN: 21 mg/dL (ref 8–23)
CO2: 19 mmol/L — ABNORMAL LOW (ref 22–32)
Calcium: 9.9 mg/dL (ref 8.9–10.3)
Chloride: 108 mmol/L (ref 98–111)
Creatinine, Ser: 0.79 mg/dL (ref 0.44–1.00)
GFR, Estimated: >60 mL/min (ref >60)
Glucose, Bld: 99 mg/dL (ref 70–99)
Potassium: 3.8 mmol/L (ref 3.5–5.1)
Sodium: 138 mmol/L (ref 135–145)
Total Bilirubin: 0.7 mg/dL (ref 0.3–1.2)
Total Protein: 7.2 g/dL (ref 6.5–8.1)

## 2022-12-15 LAB — VITAMIN B12: Vitamin B-12: 284 pg/mL (ref 180–914)

## 2022-12-15 MED ORDER — IOHEXOL 300 MG/ML  SOLN
100.0000 mL | Freq: Once | INTRAMUSCULAR | Status: AC | PRN
  Administered 2022-12-15: 100 mL via INTRAVENOUS

## 2022-12-15 MED ORDER — SODIUM CHLORIDE (PF) 0.9 % IJ SOLN
INTRAMUSCULAR | Status: AC
  Filled 2022-12-15: qty 50

## 2022-12-17 LAB — CA 125: Cancer Antigen (CA) 125: 42.7 U/mL — ABNORMAL HIGH (ref 0.0–38.1)

## 2022-12-19 ENCOUNTER — Telehealth: Payer: Self-pay

## 2022-12-19 ENCOUNTER — Inpatient Hospital Stay: Payer: Medicare Other | Admitting: Hematology and Oncology

## 2022-12-19 ENCOUNTER — Other Ambulatory Visit: Payer: Self-pay

## 2022-12-19 ENCOUNTER — Encounter: Payer: Self-pay | Admitting: Hematology and Oncology

## 2022-12-19 VITALS — BP 116/82 | HR 74 | Temp 97.4°F | Resp 18 | Ht 65.0 in | Wt 165.6 lb

## 2022-12-19 DIAGNOSIS — E538 Deficiency of other specified B group vitamins: Secondary | ICD-10-CM | POA: Diagnosis not present

## 2022-12-19 DIAGNOSIS — Z85038 Personal history of other malignant neoplasm of large intestine: Secondary | ICD-10-CM | POA: Diagnosis not present

## 2022-12-19 DIAGNOSIS — Z7189 Other specified counseling: Secondary | ICD-10-CM | POA: Diagnosis not present

## 2022-12-19 DIAGNOSIS — Z79890 Hormone replacement therapy (postmenopausal): Secondary | ICD-10-CM | POA: Diagnosis not present

## 2022-12-19 DIAGNOSIS — C5701 Malignant neoplasm of right fallopian tube: Secondary | ICD-10-CM | POA: Diagnosis not present

## 2022-12-19 DIAGNOSIS — I700 Atherosclerosis of aorta: Secondary | ICD-10-CM | POA: Diagnosis not present

## 2022-12-19 DIAGNOSIS — Z79899 Other long term (current) drug therapy: Secondary | ICD-10-CM | POA: Diagnosis not present

## 2022-12-19 DIAGNOSIS — Z9221 Personal history of antineoplastic chemotherapy: Secondary | ICD-10-CM | POA: Diagnosis not present

## 2022-12-19 NOTE — Assessment & Plan Note (Signed)
She has history of B12 deficiency and had received replacement therapy Recent B12 level was adequate I recommend oral vitamin B12 supplement

## 2022-12-19 NOTE — Assessment & Plan Note (Signed)
We had numerous goals of care discussions in the past The patient has accepted the possibility of cancer recurrence within the next 6 months to a year Quality of life is most important to her and she does not want to suffer anymore side effects of treatment She has advanced directives and living will Her sister is her healthcare power of attorney She does not want to be resuscitated in the event of terminal illness She is in agreement for palliative care and hospice referral

## 2022-12-19 NOTE — Telephone Encounter (Signed)
Called [facility] with palliative care referral. Revonda Standard will contact [place] to admit to palliative care.

## 2022-12-19 NOTE — Assessment & Plan Note (Signed)
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 Her tumor marker is almost back to normal Unfortunately, due to allergic reaction, she cannot proceed with further treatment She felt great after discontinuation of treatment We discussed potentially starting her on another regimen, another PARP inhibitor At this point in time, she is not interested to pursue any more treatment She felt that quality of life is very important to her Regardless of her prognosis, she would like to live the remainder of her life without further treatment There is no benefit to order surveillance imaging or blood work as the patient will not pursue any more treatment beyond this I recommend referral to home-based palliative care and hospice and she agrees

## 2022-12-19 NOTE — Progress Notes (Signed)
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[facility]) 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 Her tumor marker is almost back to normal Unfortunately, due to allergic reaction, she cannot proceed with further treatment She felt great after discontinuation of treatment We discussed potentially starting her on another regimen, another PARP inhibitor At this point in time, she is not interested to pursue any more treatment She felt that quality of life is very important to her Regardless of her prognosis, she would like to live the remainder of her life without further treatment There is no benefit to order surveillance imaging or blood work as the patient will not pursue any more treatment beyond this I recommend referral to home-based palliative care and hospice and she agrees   Vitamin B12 deficiency She has history of B12 deficiency and had received replacement therapy Recent B12 level was adequate I recommend oral vitamin B12 supplement  Goals of care, counseling/discussion We had numerous goals of care discussions in the past The patient has accepted the possibility of cancer recurrence within the next 6 months to a year Quality of life is most important to her and she does not want to suffer anymore side effects of treatment She has advanced directives and living will Her sister is her healthcare power of attorney She does not want to be resuscitated in the event of terminal illness She is in agreement for palliative care and hospice referral  Orders Placed This Encounter  Procedures   Ambulatory referral to Hospice    Referral  Priority:   Routine    Referral Type:   Consultation    Referral Reason:   Specialty Services Required    Requested Specialty:   Hospice Services    Number of Visits Requested:   1    All questions were answered. The patient knows to call the clinic with any problems, questions or concerns. The total time spent in the appointment was 40 minutes encounter with patients including review of chart and various tests results, discussions about plan of care and coordination of care plan   Artis Delay, MD 12/19/2022 12:07 PM  INTERVAL HISTORY: Please see below for problem oriented charting. she returns for review test results She felt great Her energy level is back to normal since discontinuation of chemotherapy We spent majority of our time reviewing test results and goals of care  REVIEW OF SYSTEMS:   Constitutional: Denies fevers, chills or abnormal weight loss Eyes: Denies blurriness of vision Ears, nose, mouth, throat, and face: Denies mucositis or sore throat Respiratory: Denies cough, dyspnea or wheezes Cardiovascular: Denies palpitation, chest discomfort or lower extremity swelling Gastrointestinal:  Denies nausea, heartburn or change in bowel habits Skin: Denies abnormal skin rashes Lymphatics: Denies new lymphadenopathy or easy bruising Neurological:Denies numbness, tingling or new weaknesses Behavioral/Psych: Mood is stable, no new changes  All other systems were reviewed with the patient and are negative.  I have reviewed the past medical history, past surgical history, social history and family history with the patient and they are unchanged from previous note.  ALLERGIES:  is allergic to barium-containing compounds, carboplatin, hydrocodone, erythromycin, and zofran [ondansetron].  MEDICATIONS:  Current Outpatient Medications  Medication Sig  Dispense Refill   Calcium Carb-Cholecalciferol (CALCIUM 600+D3 PO) Take 1 tablet by mouth in the morning and at bedtime.      cetirizine (ZYRTEC) 10 MG tablet Take 10 mg by mouth every 30 (thirty) days. As needed     cholecalciferol (VITAMIN D3) 25 MCG (1000 UNIT) tablet Take 5,000 Units by mouth in the morning.     diazepam (VALIUM) 5 MG tablet Take 1 tablet (5 mg total) by mouth daily as needed for anxiety. 30 tablet    escitalopram (LEXAPRO) 10 MG tablet TAKE 1 & 1/2 (ONE & ONE-HALF) TABLETS BY MOUTH ONCE DAILY 45 tablet 6   Ginger-Calcium Carbonate (DRAMAMINE GINGER CHEWS PO) Take by mouth.     Homeopathic Products (T-RELIEF ARNICA + 12) CREA Apply topically.     levothyroxine (SYNTHROID, LEVOTHROID) 75 MCG tablet Take 75 mcg by mouth daily before breakfast.  3   ondansetron (ZOFRAN) 8 MG tablet Take 1 tablet (8 mg total) by mouth every 8 (eight) hours as needed for refractory nausea / vomiting. (Patient not taking: Reported on 12/05/2021) 30 tablet 1   oxyCODONE-acetaminophen (PERCOCET/ROXICET) 5-325 MG tablet Take 1 tablet by mouth every 6 (six) hours as needed for severe pain. 60 tablet 0   Polyethyl Glycol-Propyl Glycol (SYSTANE) 0.4-0.3 % SOLN Place 1-2 drops into both eyes 3 (three) times daily as needed (dry/irritated eyes.).     prochlorperazine (COMPAZINE) 10 MG tablet Take 1 tablet (10 mg total) by mouth every 6 (six) hours as needed (Nausea or vomiting). (Patient not taking: Reported on 12/05/2021) 30 tablet 1   SUNSCREEN SPF50 EX      valACYclovir (VALTREX) 1000 MG tablet Take 1 tablet (1,000 mg total) by mouth daily as needed (Fever Blister).     zolpidem (AMBIEN) 10 MG tablet Take 1 tablet (10 mg total) by mouth at bedtime as needed. for sleep 30 tablet 2   No current facility-administered medications for this visit.    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 OMENTUM, NEEDLE CORE BIOPSY:  - Metastatic poorly differentiated adenocarcinoma.  See comment   COMMENT:   Immunohistochemical stains show that the tumor cells are positive for CK7 and negative for CDX2 and CK20.  This immunoprofile is nonspecific and differential diagnosis can include an upper gastrointestinal, breast and lung primary among other possibilities.  Only a very small fraction of primary colonic adenocarcinoma was immunoprofile.  Clinical and radiologic correlation suggested.   Additional immunohistochemical stains are performed and show that the tumor cells are positive for WT1, PAX8 and ER.  Immunostain for p53 shows a clonal overexpression pattern.  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  Clinical and radiologic correlation is suggested.    01/19/2021 Tumor Marker   Patient's tumor was tested for the following markers: CA-125. Results of the tumor marker test revealed 6790.   01/28/2021 PET scan   1. Examination is positive for extensive FDG avid peritoneal disease within the abdomen and pelvis including  multiple implants upon the surface of liver and extensive omental caking. Moderate ascites noted within the pelvis. 2. FDG avid retroperitoneal, retrocrural, right internal mammary, and mediastinal lymph nodes compatible with metastatic adenopathy. 3. Small right pleural effusion. 4.  Aortic Atherosclerosis (ICD10-I70.0).     01/31/2021 Initial Diagnosis   Primary peritoneal carcinomatosis (HCC)   01/31/2021 Cancer Staging   Staging form: Ovary, Fallopian Tube, and Primary Peritoneal Carcinoma, AJCC 8th Edition - Clinical stage from  01/31/2021: FIGO Stage IV (cT2b, cN1b, cM1) - Signed by Artis Delay, MD on 01/31/2021 Stage prefix: Initial diagnosis   02/14/2021 Tumor Marker   Patient's tumor was tested for the following markers: CA-125. Results of the tumor marker test revealed 8668.   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 Remainder of testing was negative/normal. The report date is 02/09/2021.  The CancerNext-Expanded + RNAinsight gene panel offered by [employer] and includes sequencing and rearrangement analysis for the following 77 genes: IP, ALK, APC*, ATM*, AXIN2, BAP1, BARD1, BLM, BMPR1A, BRCA1*, BRCA2*, BRIP1*, CDC73, CDH1*,CDK4, CDKN1B, CDKN2A, CHEK2*, CTNNA1, DICER1, FANCC, FH, FLCN, GALNT12, KIF1B, LZTR1, MAX, MEN1, MET, MLH1*, MSH2*, MSH3, MSH6*, MUTYH*, NBN, NF1*, NF2, NTHL1, PALB2*, PHOX2B, PMS2*, POT1, PRKAR1A, PTCH1, PTEN*, RAD51C*, RAD51D*,RB1, RECQL, RET, SDHA, SDHAF2, SDHB, SDHC, SDHD, SMAD4, SMARCA4, SMARCB1, SMARCE1, STK11, SUFU, TMEM127, TP53*,TSC1, TSC2, VHL and XRCC2 (sequencing and deletion/duplication); EGFR, EGLN1, HOXB13, KIT, MITF, PDGFRA, POLD1 and POLE (sequencing only); EPCAM and GREM1 (deletion/duplication only).   03/10/2021 Tumor Marker   Patient's tumor was tested for the following markers: CA-125. Results of the tumor marker test revealed 2002.   04/14/2021 Tumor Marker   Patient's tumor was tested for the following markers: CA-125. Results of the tumor marker test revealed 278.   04/26/2021 Imaging   Significant decrease in peritoneal carcinomatosis since previous study, with resolution of ascites.   Decreased anterior mediastinal and retrocrural lymphadenopathy.   No new or progressive metastatic disease within the chest, abdomen, or pelvis.   Aortic Atherosclerosis (ICD10-I70.0).   04/29/2021 Tumor Marker    Patient's tumor was tested for the following markers: CA-125. Results of the tumor marker test revealed 134.   05/12/2021 Surgery   Tumor debulking including robotic-assisted laparoscopic total hysterectomy with bilateral salpingoophorectomy, lysis of adhesions, excision and fulguration of peritoneal nodules, mini laparotomy for intra-abdominal palpation and omentectomy  Findings: : On exam under anesthesia, small mobile uterus.  On intra-abdominal entry, normal upper abdominal survey including liver and stomach.  Right diaphragm with small peritoneal studding, all lesions less than 5 mm.  Omentum with an approximately 5 cm mass adherent to the lower anterior abdominal wall and bladder peritoneum.  Normal 6 cm uterus.  Somewhat atrophic appearing bilateral adnexa with tumor implants.  Sigmoid and rectal colon and mesentery with moderate peritoneal disease, all lesions less than 5 mm.  Multiple peritoneal implants on bilateral pelvic sidewalls and in the posterior cul-de-sac, all measuring less than 5 mm.  Multiple omental implants measuring several mm.  Approximately 1 cm area within the transverse colon mesentery, unclear if this is related to scar tissue from prior colon resection versus tumor implant.  No obvious pelvic adenopathy, no palpable para-aortic adenopathy. R1 resection at the end of surgery   05/12/2021 Pathology Results   A. UTERUS, CERVIX, BILATERAL FALLOPIAN TUBES AND OVARIES:  - Fallopian tubes:       -  Bilateral fallopian tubes involved by high-grade serous  carcinoma.  - Ovaries:       - Bilateral ovaries involved by high-grade serous carcinoma.  - Uterine cervix:       - Benign transformation zone.       - Negative for squamous intraepithelial lesion and malignancy.  - Endometrium:       - Inactive endometrium.       - Negative for atypical hyperplasia/EIN and malignancy.  - Myometrium:       - Negative for malignancy.  - Uterine serosa:       - Involved by metastatic  high-grade serous carcinoma.   B. PERITONEAL IMPLANT, EXCISION:  - Metastatic high-grade serous carcinoma.   C. OMENTUM, RESECTION:  - Metastatic high-grade serous carcinoma, multifocal.   COMMENT:   A. Immunohistochemical studies show tumor cells to be positive for CK7,  Pax-8, and WT-1, and negative for CK20, PR, and CDX-2. These findings  support the above diagnosis. A p53 shows strong and diffuse staining  within neoplastic cells, indicating mutated p53 status.   OVARY or FALLOPIAN TUBE or PRIMARY PERITONEUM: Resection   Procedure: Total hysterectomy and bilateral salpingo-oophorectomy  Specimen Integrity: Intact  Tumor Site: Favor right fallopian tube  Tumor Size: At least 1.7 cm  Histologic Type: High-grade serous carcinoma  Histologic Grade: High-grade  Ovarian Surface Involvement: Present, right and left  Fallopian Tube Surface Involvement: Present, right and left  Implants (required for advanced stage serous/seromucinous borderline  tumors only): Present: Peritoneal and omental  Other Tissue/ Organ Involvement: Bilateral ovaries, uterine serosa,  omentum, pelvic peritoneum  Largest Extrapelvic Peritoneal Focus: At least 6.7 cm  Peritoneal/Ascitic Fluid Involvement: Not submitted/unknown  Chemotherapy Response Score (CRS): CRS 1 (no definite or minimal  response)  Regional Lymph Nodes: Not applicable (no lymph nodes submitted or found)   Distant Metastasis:       Distant Site(s) Involved: Not applicable  Pathologic Stage Classification (pTNM, AJCC 8th Edition): ypT3c, pN not  assigned  TNM classifiers: y (posttreatment)  Ancillary Studies: Can be performed upon request  Representative Tumor Block: A4  Comment(s): None    06/30/2021 Tumor Marker   Patient's tumor was tested for the following markers: CA-125. Results of the tumor marker test revealed 72.8.   08/11/2021 Tumor Marker   Patient's tumor was tested for the following markers: Ca-125. Results of the  tumor marker test revealed 30.7.   09/08/2021 Tumor Marker   Patient's tumor was tested for the following markers: CA-125. Results of the tumor marker test revealed 21.4.   09/09/2021 Imaging   1. No new or progressive metastatic disease in the chest abdomen or pelvis. 2. Continued interval decrease in omental caking now with minimal residual nodular stranding. 3. Similar to minimally decreased mediastinal adenopathy. 4. Aortic Atherosclerosis (ICD10-I70.0).   10/04/2021 Tumor Marker   Patient's tumor was tested for the following markers: CA-125. Results of the tumor marker test revealed 22.2.   10/08/2021 -  Chemotherapy   She was started on lynparza   10/27/2021 Tumor Marker   Patient's tumor was tested for the following markers: CA-125. Results of the tumor marker test revealed 24.2.   12/06/2021 Tumor Marker   Patient's tumor was tested for the following markers: CA-125. Results of the tumor marker test revealed 25.1.   01/09/2022 Imaging   1. New 0.4 cm fissural nodule of the superior segment left lower lobe, nonspecific although modestly suspicious for pulmonary metastatic disease. Attention on follow-up. 2. No other evidence of  new metastatic disease in the chest, abdomen, or pelvis. 3. Minimal residual omental and peritoneal stranding and nodularity, unchanged. 4. No significant change in enlarged right hilar, pretracheal, and right superior mediastinal lymph nodes. 5. Status post partial right hemicolectomy and reanastomosis. 6. Status post hysterectomy.   Aortic Atherosclerosis (ICD10-I70.0).   01/09/2022 Tumor Marker   Patient's tumor was tested for the following markers: CA-125. Results of the tumor marker test revealed 29.2.   02/13/2022 Tumor Marker   Patient's tumor was tested for the following markers: CA-125. Results of the tumor marker test revealed 39.2.   03/14/2022 Tumor Marker   Patient's tumor was tested for the following markers: CA-125. Results of the  tumor marker test revealed 56.6.   03/23/2022 Imaging   1. No definitive findings to suggest metastatic disease to the chest, abdomen or pelvis. 2. Resolution of previously noted perifissural nodule in the superior aspect of the left major fissure. New 3 mm subpleural nodule in the periphery of the left lower lobe, nonspecific, but statistically likely benign. Attention at time of follow-up imaging is recommended to ensure stability or regression. 3. Aortic atherosclerosis. 4. Additional incidental findings, as above.   04/13/2022 Tumor Marker   Patient's tumor was tested for the following markers: CA-125. Results of the tumor marker test revealed 74.7.   05/15/2022 Tumor Marker   Patient's tumor was tested for the following markers: CA-125. Results of the tumor marker test revealed 98.4.   06/15/2022 Tumor Marker   Patient's tumor was tested for the following markers: CA-125. Results of the tumor marker test revealed 85.9.   08/08/2022 Tumor Marker   Patient's tumor was tested for the following markers: CA-125. Results of the tumor marker test revealed 135.   08/17/2022 Imaging   1. 10 x 17 mm soft tissue structure identified in the aortocaval space. While subtle, this does appear to the enlarging and retroperitoneal lymph node is a distinct concern. There is a slight possibility this could be a duodenal diverticulum, but that possibility is considered less likely. Close follow-up recommended. This structure is large enough that PET-CT may provide helpful additional characterization. 2. Right hemicolectomy. 3.  Aortic Atherosclerosis (ICD10-I70.0).   09/06/2022 PET scan   1. Enlarged and tracer avid high right paratracheal lymph node within the mediastinum compatible with residual/recurrent nodal metastasis. 2. Enlarged and tracer avid retroperitoneal lymph node is also identified compatible with residual/recurrent nodal metastasis. 3. No ascites identified at this time. Small omental  nodule measures 6 mm and is too small to characterize by PET-CT. 4. No signs of solid organ or nodal metastasis within the abdomen or pelvis. 5.  Aortic Atherosclerosis (ICD10-I70.0).   10/05/2022 - 11/16/2022 Chemotherapy   Patient is on Treatment Plan : OVARIAN Carboplatin (AUC 6) q21d x 6 Cycles     10/06/2022 Tumor Marker   Patient's tumor was tested for the following markers: CA-125. Results of the tumor marker test revealed 238.   12/18/2022 Tumor Marker   Patient's tumor was tested for the following markers: CA-125. Results of the tumor marker test revealed 42.7.   12/19/2022 Imaging   CT CHEST ABDOMEN PELVIS W CONTRAST  Result Date: 12/18/2022 CLINICAL DATA:  Recurrent ovarian cancer. Assess treatment response. * Tracking Code: BO * EXAM: CT CHEST, ABDOMEN, AND PELVIS WITH CONTRAST TECHNIQUE: Multidetector CT imaging of the chest, abdomen and pelvis was performed following the standard protocol during bolus administration of intravenous contrast. RADIATION DOSE REDUCTION: This exam was performed according to the departmental dose-optimization program which  includes automated exposure control, adjustment of the mA and/or kV according to patient size and/or use of iterative reconstruction technique. CONTRAST:  OMNIPAQUE IOHEXOL 300 MG/ML  SOLN COMPARISON:  PET-CT 09/04/2022, abdominopelvic CT 08/16/2022 and CT of the chest, abdomen and pelvis 03/21/2022. FINDINGS: CT CHEST FINDINGS Cardiovascular: No acute vascular findings. Mild aortic and great vessel atherosclerosis. The heart size is normal. There is no pericardial effusion. Mediastinum/Nodes: The hypermetabolic pretracheal node seen on the most recent PET-CT has decreased in size, now measuring 1.3 x 0.8 cm on image 21/2 (previously 1.9 x 1.2 cm, remeasured). 1.0 cm precarinal node on image 28/2 is unchanged, not hypermetabolic on PET-CT. No progressive mediastinal, hilar or axillary adenopathy identified. The thyroid gland, trachea and  esophagus demonstrate no significant findings. Lungs/Pleura: No pleural effusion or pneumothorax. Stable biapical scarring with additional scattered subpleural reticulation in both lungs. No airspace disease or suspicious pulmonary nodule. Musculoskeletal/Chest wall: No chest wall mass or suspicious osseous findings. CT ABDOMEN AND PELVIS FINDINGS Hepatobiliary: The liver is normal in density without suspicious focal abnormality. No evidence of gallstones, gallbladder wall thickening or biliary dilatation. Pancreas: Unremarkable. No pancreatic ductal dilatation or surrounding inflammatory changes. Spleen: Normal in size without focal abnormality. Adrenals/Urinary Tract: Both adrenal glands appear normal. No evidence of urinary tract calculus, suspicious renal lesion or hydronephrosis. The bladder appears normal for its degree of distention. Stomach/Bowel: No enteric contrast administered. The stomach appears unremarkable for its degree of distension. No evidence of bowel wall thickening, distention or surrounding inflammatory change. Stable postsurgical changes from previous right hemicolectomy and ileocolonic anastomosis. Vascular/Lymphatic: The hypermetabolic aortocaval node on recent PET-CT appears slightly smaller, measuring 1.0 cm short axis on image 82/2 (previously 1.2 cm). No enlarging abdominopelvic lymph nodes are identified. Mild aortic and branch vessel atherosclerosis without evidence of large vessel occlusion or aneurysm. Reproductive: Status post hysterectomy. No evidence of adnexal mass. Other: Stable postsurgical changes in the anterior abdominal wall. No ascites, peritoneal nodularity or pneumoperitoneum. Musculoskeletal: No acute or significant osseous findings. Mild lumbar spondylosis. IMPRESSION: 1. Interval decreased size of previously hypermetabolic pretracheal and aortocaval lymph nodes consistent with response to therapy. No progressive disease identified. 2. Stable postsurgical changes from  previous right hemicolectomy and ileocolonic anastomosis. 3.  Aortic Atherosclerosis (ICD10-I70.0). Electronically Signed   By: Carey Bullocks M.D.   On: 12/18/2022 16:30        PHYSICAL EXAMINATION: ECOG PERFORMANCE STATUS: 0 - Asymptomatic  Vitals:   12/19/22 0900  BP: 116/82  Pulse: 74  Resp: 18  Temp: (!) 97.4 F (36.3 C)  SpO2: 97%   Filed Weights   12/19/22 0900  Weight: 165 lb 9.6 oz (75.1 kg)    GENERAL:alert, no distress and comfortable NEURO: alert & oriented x 3 with fluent speech, no focal motor/sensory deficits  LABORATORY DATA:  I have reviewed the data as listed    Component Value Date/Time   NA 138 12/15/2022 0838   K 3.8 12/15/2022 0838   CL 108 12/15/2022 0838   CO2 19 (L) 12/15/2022 0838   GLUCOSE 99 12/15/2022 0838   BUN 21 12/15/2022 0838   CREATININE 0.79 12/15/2022 0838   CREATININE 0.72 11/16/2022 0943   CREATININE 0.68 03/14/2016 1350   CALCIUM 9.9 12/15/2022 0838   PROT 7.2 12/15/2022 0838   ALBUMIN 3.9 12/15/2022 0838   AST 16 12/15/2022 0838   AST 17 11/16/2022 0943   ALT 15 12/15/2022 0838   ALT 15 11/16/2022 0943   ALKPHOS 77 12/15/2022 1478  BILITOT 0.7 12/15/2022 0838   BILITOT 0.6 11/16/2022 0943   GFRNONAA >60 12/15/2022 0838   GFRNONAA >60 11/16/2022 0943   GFRAA >60 03/19/2016 0455    No results found for: "SPEP", "UPEP"  Lab Results  Component Value Date   WBC 3.9 (L) 12/15/2022   NEUTROABS 1.4 (L) 12/15/2022   HGB 12.8 12/15/2022   HCT 37.2 12/15/2022   MCV 102.2 (H) 12/15/2022   PLT 249 12/15/2022      Chemistry      Component Value Date/Time   NA 138 12/15/2022 0838   K 3.8 12/15/2022 0838   CL 108 12/15/2022 0838   CO2 19 (L) 12/15/2022 0838   BUN 21 12/15/2022 0838   CREATININE 0.79 12/15/2022 0838   CREATININE 0.72 11/16/2022 0943   CREATININE 0.68 03/14/2016 1350      Component Value Date/Time   CALCIUM 9.9 12/15/2022 0838   ALKPHOS 77 12/15/2022 0838   AST 16 12/15/2022 0838   AST 17  11/16/2022 0943   ALT 15 12/15/2022 0838   ALT 15 11/16/2022 0943   BILITOT 0.7 12/15/2022 0838   BILITOT 0.6 11/16/2022 0943       RADIOGRAPHIC STUDIES: I have reviewed imaging studies with the patient and her sister I have personally reviewed the radiological images as listed and agreed with the findings in the report. CT CHEST ABDOMEN PELVIS W CONTRAST  Result Date: 12/18/2022 CLINICAL DATA:  Recurrent ovarian cancer. Assess treatment response. * Tracking Code: BO * EXAM: CT CHEST, ABDOMEN, AND PELVIS WITH CONTRAST TECHNIQUE: Multidetector CT imaging of the chest, abdomen and pelvis was performed following the standard protocol during bolus administration of intravenous contrast. RADIATION DOSE REDUCTION: This exam was performed according to the departmental dose-optimization program which includes automated exposure control, adjustment of the mA and/or kV according to patient size and/or use of iterative reconstruction technique. CONTRAST:  OMNIPAQUE IOHEXOL 300 MG/ML  SOLN COMPARISON:  PET-CT 09/04/2022, abdominopelvic CT 08/16/2022 and CT of the chest, abdomen and pelvis 03/21/2022. FINDINGS: CT CHEST FINDINGS Cardiovascular: No acute vascular findings. Mild aortic and great vessel atherosclerosis. The heart size is normal. There is no pericardial effusion. Mediastinum/Nodes: The hypermetabolic pretracheal node seen on the most recent PET-CT has decreased in size, now measuring 1.3 x 0.8 cm on image 21/2 (previously 1.9 x 1.2 cm, remeasured). 1.0 cm precarinal node on image 28/2 is unchanged, not hypermetabolic on PET-CT. No progressive mediastinal, hilar or axillary adenopathy identified. The thyroid gland, trachea and esophagus demonstrate no significant findings. Lungs/Pleura: No pleural effusion or pneumothorax. Stable biapical scarring with additional scattered subpleural reticulation in both lungs. No airspace disease or suspicious pulmonary nodule. Musculoskeletal/Chest wall: No chest  wall mass or suspicious osseous findings. CT ABDOMEN AND PELVIS FINDINGS Hepatobiliary: The liver is normal in density without suspicious focal abnormality. No evidence of gallstones, gallbladder wall thickening or biliary dilatation. Pancreas: Unremarkable. No pancreatic ductal dilatation or surrounding inflammatory changes. Spleen: Normal in size without focal abnormality. Adrenals/Urinary Tract: Both adrenal glands appear normal. No evidence of urinary tract calculus, suspicious renal lesion or hydronephrosis. The bladder appears normal for its degree of distention. Stomach/Bowel: No enteric contrast administered. The stomach appears unremarkable for its degree of distension. No evidence of bowel wall thickening, distention or surrounding inflammatory change. Stable postsurgical changes from previous right hemicolectomy and ileocolonic anastomosis. Vascular/Lymphatic: The hypermetabolic aortocaval node on recent PET-CT appears slightly smaller, measuring 1.0 cm short axis on image 82/2 (previously 1.2 cm). No enlarging abdominopelvic lymph nodes are  identified. Mild aortic and branch vessel atherosclerosis without evidence of large vessel occlusion or aneurysm. Reproductive: Status post hysterectomy. No evidence of adnexal mass. Other: Stable postsurgical changes in the anterior abdominal wall. No ascites, peritoneal nodularity or pneumoperitoneum. Musculoskeletal: No acute or significant osseous findings. Mild lumbar spondylosis. IMPRESSION: 1. Interval decreased size of previously hypermetabolic pretracheal and aortocaval lymph nodes consistent with response to therapy. No progressive disease identified. 2. Stable postsurgical changes from previous right hemicolectomy and ileocolonic anastomosis. 3.  Aortic Atherosclerosis (ICD10-I70.0). Electronically Signed   By: Carey Bullocks M.D.   On: 12/18/2022 16:30

## 2023-01-11 DIAGNOSIS — N904 Leukoplakia of vulva: Secondary | ICD-10-CM | POA: Diagnosis not present

## 2023-01-11 DIAGNOSIS — N952 Postmenopausal atrophic vaginitis: Secondary | ICD-10-CM | POA: Diagnosis not present

## 2023-01-16 DIAGNOSIS — K080 Exfoliation of teeth due to systemic causes: Secondary | ICD-10-CM | POA: Diagnosis not present

## 2023-01-24 ENCOUNTER — Other Ambulatory Visit: Payer: Self-pay | Admitting: Hematology and Oncology

## 2023-02-05 ENCOUNTER — Encounter: Payer: Self-pay | Admitting: Hematology and Oncology

## 2023-02-05 ENCOUNTER — Telehealth: Payer: Self-pay | Admitting: Oncology

## 2023-02-05 NOTE — Telephone Encounter (Signed)
Called Jaana regarding her MyChart message about hot flashes.  Becci said that she is having 3-4 hot flashes per day. She continues to work 24 hours a week and the hot flashes are making it difficult. She is wondering if a cream or patch would help. Advised that Dr. Bertis Ruddy does not prescribe hormones due to the high risk of blood clots.  I will reach out to Dr. Pricilla Holm to see if she has any recommendations and call Linley back.

## 2023-02-05 NOTE — Telephone Encounter (Signed)
Called Leslie Rivera back and advised her that Dr. Pricilla Holm is recommending starting with non hormonal therapy. Discussed Effexor and she is currently taking Lexapro. She is not sure if she can take both. Also talked about Gabapentin and she has taken it before and didn't like how it made her feel.  She would like it if Dr. Pricilla Holm could call her anytime today or tomorrow to discuss.

## 2023-02-06 ENCOUNTER — Inpatient Hospital Stay: Payer: Medicare Other | Attending: Genetic Counselor | Admitting: Gynecologic Oncology

## 2023-02-06 ENCOUNTER — Encounter: Payer: Self-pay | Admitting: Gynecologic Oncology

## 2023-02-06 DIAGNOSIS — N951 Menopausal and female climacteric states: Secondary | ICD-10-CM

## 2023-02-06 DIAGNOSIS — R232 Flushing: Secondary | ICD-10-CM

## 2023-02-06 DIAGNOSIS — C569 Malignant neoplasm of unspecified ovary: Secondary | ICD-10-CM

## 2023-02-06 MED ORDER — ESTRADIOL 0.025 MG/24HR TD PTWK
0.0250 mg | MEDICATED_PATCH | TRANSDERMAL | 12 refills | Status: DC
Start: 2023-02-06 — End: 2023-11-30

## 2023-02-06 NOTE — Progress Notes (Signed)
Called patient x2, no answer. Left voicemail - will try her again tomorrow.

## 2023-02-06 NOTE — Progress Notes (Signed)
Gynecologic Oncology Telehealth Note: Gyn-Onc  I connected with Leslie Rivera on 02/06/23 at 11:00 AM EDT by telephone and verified that I am speaking with the correct person using two identifiers.  I discussed the limitations, risks, security and privacy concerns of performing an evaluation and management service by telemedicine and the availability of in-person appointments. I also discussed with the patient that there may be a patient responsible charge related to this service. The patient expressed understanding and agreed to proceed.  Other persons participating in the visit and their role in the encounter: none.  Patient's location: home Provider's location: [place]  Reason for Visit: follow-up  Treatment History: Oncology History Overview Note  Normal MMR HRD positive, progressed on olaparib Developed carboplatin allergy on 11/16/22   Cancer of right fallopian tube [facility])  01/05/2021 Pathology Results   SURGICAL PATHOLOGY  CASE: [id number]  PATIENT: [facility]  Surgical Pathology Report   Reason for Addendum #1:  Immunohistochemistry results  Reason for Addendum #2:  DNA Mismatch Repair IHC Results   Clinical History: history of colon cancer, now with omental thickening  (cm)   FINAL MICROSCOPIC DIAGNOSIS:   A. OMENTUM, NEEDLE CORE BIOPSY:  - Metastatic poorly differentiated adenocarcinoma.  See comment   COMMENT:   Immunohistochemical stains show that the tumor cells are positive for CK7 and negative for CDX2 and CK20.  This immunoprofile is nonspecific and differential diagnosis can include an upper gastrointestinal, breast and lung primary among other possibilities.  Only a very small fraction of primary colonic adenocarcinoma was immunoprofile.  Clinical and radiologic correlation suggested.   Additional immunohistochemical stains are performed and show that the tumor cells are positive for WT1, PAX8 and ER.  Immunostain for p53 shows a clonal overexpression  pattern.  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  Clinical and radiologic correlation is suggested.    01/19/2021 Tumor Marker   Patient's tumor was tested for the following markers: CA-125. Results of the tumor marker test revealed 6790.   01/28/2021 PET scan   1. Examination is positive for extensive FDG avid peritoneal disease within the abdomen and pelvis including multiple implants upon the surface of liver and extensive omental caking. Moderate ascites noted within the pelvis. 2. FDG avid retroperitoneal, retrocrural, right internal mammary, and mediastinal lymph nodes compatible with metastatic adenopathy. 3. Small right pleural effusion. 4.  Aortic Atherosclerosis (ICD10-I70.0).     01/31/2021 Initial Diagnosis   Primary peritoneal carcinomatosis (HCC)   01/31/2021 Cancer Staging   Staging form: Ovary, Fallopian Tube, and Primary Peritoneal Carcinoma, AJCC 8th Edition - Clinical stage from 01/31/2021: FIGO Stage IV (cT2b, cN1b, cM1) - Signed by Artis Delay, MD on 01/31/2021 Stage prefix: Initial diagnosis   02/14/2021 Tumor Marker   Patient's tumor was tested for the following markers: CA-125. Results of the tumor marker test revealed 8668.   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 Remainder of testing was negative/normal. The report date is 02/09/2021.  The CancerNext-Expanded + RNAinsight gene panel offered by [employer] and includes sequencing and rearrangement analysis for the following 77 genes: IP, ALK, APC*, ATM*, AXIN2, BAP1, BARD1, BLM, BMPR1A, BRCA1*, BRCA2*, BRIP1*, CDC73, CDH1*,CDK4, CDKN1B, CDKN2A, CHEK2*, CTNNA1, DICER1, FANCC, FH, FLCN, GALNT12, KIF1B, LZTR1, MAX, MEN1, MET, MLH1*, MSH2*, MSH3, MSH6*, MUTYH*, NBN,  NF1*, NF2,  NTHL1, PALB2*, PHOX2B, PMS2*, POT1, PRKAR1A, PTCH1, PTEN*, RAD51C*, RAD51D*,RB1, RECQL, RET, SDHA, SDHAF2, SDHB, SDHC, SDHD, SMAD4, SMARCA4, SMARCB1, SMARCE1, STK11, SUFU, TMEM127, TP53*,TSC1, TSC2, VHL and XRCC2 (sequencing and deletion/duplication); EGFR, EGLN1, HOXB13, KIT, MITF, PDGFRA, POLD1 and POLE (sequencing only); EPCAM and GREM1 (deletion/duplication only).   03/10/2021 Tumor Marker   Patient's tumor was tested for the following markers: CA-125. Results of the tumor marker test revealed 2002.   04/14/2021 Tumor Marker   Patient's tumor was tested for the following markers: CA-125. Results of the tumor marker test revealed 278.   04/26/2021 Imaging   Significant decrease in peritoneal carcinomatosis since previous study, with resolution of ascites.   Decreased anterior mediastinal and retrocrural lymphadenopathy.   No new or progressive metastatic disease within the chest, abdomen, or pelvis.   Aortic Atherosclerosis (ICD10-I70.0).   04/29/2021 Tumor Marker   Patient's tumor was tested for the following markers: CA-125. Results of the tumor marker test revealed 134.   05/12/2021 Surgery   Tumor debulking including robotic-assisted laparoscopic total hysterectomy with bilateral salpingoophorectomy, lysis of adhesions, excision and fulguration of peritoneal nodules, mini laparotomy for intra-abdominal palpation and omentectomy  Findings: : On exam under anesthesia, small mobile uterus.  On intra-abdominal entry, normal upper abdominal survey including liver and stomach.  Right diaphragm with small peritoneal studding, all lesions less than 5 mm.  Omentum with an approximately 5 cm mass adherent to the lower anterior abdominal wall and bladder peritoneum.  Normal 6 cm uterus.  Somewhat atrophic appearing bilateral adnexa with tumor implants.  Sigmoid and rectal colon and mesentery with moderate peritoneal disease, all lesions less than 5 mm.  Multiple peritoneal implants on bilateral  pelvic sidewalls and in the posterior cul-de-sac, all measuring less than 5 mm.  Multiple omental implants measuring several mm.  Approximately 1 cm area within the transverse colon mesentery, unclear if this is related to scar tissue from prior colon resection versus tumor implant.  No obvious pelvic adenopathy, no palpable para-aortic adenopathy. R1 resection at the end of surgery   05/12/2021 Pathology Results   A. UTERUS, CERVIX, BILATERAL FALLOPIAN TUBES AND OVARIES:  - Fallopian tubes:       - Bilateral fallopian tubes involved by high-grade serous  carcinoma.  - Ovaries:       - Bilateral ovaries involved by high-grade serous carcinoma.  - Uterine cervix:       - Benign transformation zone.       - Negative for squamous intraepithelial lesion and malignancy.  - Endometrium:       - Inactive endometrium.       - Negative for atypical hyperplasia/EIN and malignancy.  - Myometrium:       - Negative for malignancy.  - Uterine serosa:       - Involved by metastatic high-grade serous carcinoma.   B. PERITONEAL IMPLANT, EXCISION:  - Metastatic high-grade serous carcinoma.   C. OMENTUM, RESECTION:  - Metastatic high-grade serous carcinoma, multifocal.   COMMENT:   A. Immunohistochemical studies show tumor cells to be positive for CK7,  Pax-8, and WT-1, and negative for CK20, PR, and CDX-2. These findings  support the above diagnosis. A p53 shows strong and diffuse staining  within neoplastic cells, indicating mutated p53 status.   OVARY or FALLOPIAN TUBE or PRIMARY PERITONEUM: Resection   Procedure: Total hysterectomy and bilateral salpingo-oophorectomy  Specimen Integrity: Intact  Tumor Site: Favor right fallopian tube  Tumor Size: At least 1.7 cm  Histologic Type: High-grade  serous carcinoma  Histologic Grade: High-grade  Ovarian Surface Involvement: Present, right and left  Fallopian Tube Surface Involvement: Present, right and left  Implants (required for advanced stage  serous/seromucinous borderline  tumors only): Present: Peritoneal and omental  Other Tissue/ Organ Involvement: Bilateral ovaries, uterine serosa,  omentum, pelvic peritoneum  Largest Extrapelvic Peritoneal Focus: At least 6.7 cm  Peritoneal/Ascitic Fluid Involvement: Not submitted/unknown  Chemotherapy Response Score (CRS): CRS 1 (no definite or minimal  response)  Regional Lymph Nodes: Not applicable (no lymph nodes submitted or found)   Distant Metastasis:       Distant Site(s) Involved: Not applicable  Pathologic Stage Classification (pTNM, AJCC 8th Edition): ypT3c, pN not  assigned  TNM classifiers: y (posttreatment)  Ancillary Studies: Can be performed upon request  Representative Tumor Block: A4  Comment(s): None    06/30/2021 Tumor Marker   Patient's tumor was tested for the following markers: CA-125. Results of the tumor marker test revealed 72.8.   08/11/2021 Tumor Marker   Patient's tumor was tested for the following markers: Ca-125. Results of the tumor marker test revealed 30.7.   09/08/2021 Tumor Marker   Patient's tumor was tested for the following markers: CA-125. Results of the tumor marker test revealed 21.4.   09/09/2021 Imaging   1. No new or progressive metastatic disease in the chest abdomen or pelvis. 2. Continued interval decrease in omental caking now with minimal residual nodular stranding. 3. Similar to minimally decreased mediastinal adenopathy. 4. Aortic Atherosclerosis (ICD10-I70.0).   10/04/2021 Tumor Marker   Patient's tumor was tested for the following markers: CA-125. Results of the tumor marker test revealed 22.2.   10/08/2021 -  Chemotherapy   She was started on lynparza   10/27/2021 Tumor Marker   Patient's tumor was tested for the following markers: CA-125. Results of the tumor marker test revealed 24.2.   12/06/2021 Tumor Marker   Patient's tumor was tested for the following markers: CA-125. Results of the tumor marker test revealed  25.1.   01/09/2022 Imaging   1. New 0.4 cm fissural nodule of the superior segment left lower lobe, nonspecific although modestly suspicious for pulmonary metastatic disease. Attention on follow-up. 2. No other evidence of new metastatic disease in the chest, abdomen, or pelvis. 3. Minimal residual omental and peritoneal stranding and nodularity, unchanged. 4. No significant change in enlarged right hilar, pretracheal, and right superior mediastinal lymph nodes. 5. Status post partial right hemicolectomy and reanastomosis. 6. Status post hysterectomy.   Aortic Atherosclerosis (ICD10-I70.0).   01/09/2022 Tumor Marker   Patient's tumor was tested for the following markers: CA-125. Results of the tumor marker test revealed 29.2.   02/13/2022 Tumor Marker   Patient's tumor was tested for the following markers: CA-125. Results of the tumor marker test revealed 39.2.   03/14/2022 Tumor Marker   Patient's tumor was tested for the following markers: CA-125. Results of the tumor marker test revealed 56.6.   03/23/2022 Imaging   1. No definitive findings to suggest metastatic disease to the chest, abdomen or pelvis. 2. Resolution of previously noted perifissural nodule in the superior aspect of the left major fissure. New 3 mm subpleural nodule in the periphery of the left lower lobe, nonspecific, but statistically likely benign. Attention at time of follow-up imaging is recommended to ensure stability or regression. 3. Aortic atherosclerosis. 4. Additional incidental findings, as above.   04/13/2022 Tumor Marker   Patient's tumor was tested for the following markers: CA-125. Results of the tumor marker test revealed  74.7.   05/15/2022 Tumor Marker   Patient's tumor was tested for the following markers: CA-125. Results of the tumor marker test revealed 98.4.   06/15/2022 Tumor Marker   Patient's tumor was tested for the following markers: CA-125. Results of the tumor marker test revealed  85.9.   08/08/2022 Tumor Marker   Patient's tumor was tested for the following markers: CA-125. Results of the tumor marker test revealed 135.   08/17/2022 Imaging   1. 10 x 17 mm soft tissue structure identified in the aortocaval space. While subtle, this does appear to the enlarging and retroperitoneal lymph node is a distinct concern. There is a slight possibility this could be a duodenal diverticulum, but that possibility is considered less likely. Close follow-up recommended. This structure is large enough that PET-CT may provide helpful additional characterization. 2. Right hemicolectomy. 3.  Aortic Atherosclerosis (ICD10-I70.0).   09/06/2022 PET scan   1. Enlarged and tracer avid high right paratracheal lymph node within the mediastinum compatible with residual/recurrent nodal metastasis. 2. Enlarged and tracer avid retroperitoneal lymph node is also identified compatible with residual/recurrent nodal metastasis. 3. No ascites identified at this time. Small omental nodule measures 6 mm and is too small to characterize by PET-CT. 4. No signs of solid organ or nodal metastasis within the abdomen or pelvis. 5.  Aortic Atherosclerosis (ICD10-I70.0).   10/05/2022 - 11/16/2022 Chemotherapy   Patient is on Treatment Plan : OVARIAN Carboplatin (AUC 6) q21d x 6 Cycles     10/06/2022 Tumor Marker   Patient's tumor was tested for the following markers: CA-125. Results of the tumor marker test revealed 238.   12/18/2022 Tumor Marker   Patient's tumor was tested for the following markers: CA-125. Results of the tumor marker test revealed 42.7.   12/19/2022 Imaging   CT CHEST ABDOMEN PELVIS W CONTRAST  Result Date: 12/18/2022 CLINICAL DATA:  Recurrent ovarian cancer. Assess treatment response. * Tracking Code: BO * EXAM: CT CHEST, ABDOMEN, AND PELVIS WITH CONTRAST TECHNIQUE: Multidetector CT imaging of the chest, abdomen and pelvis was performed following the standard protocol during bolus  administration of intravenous contrast. RADIATION DOSE REDUCTION: This exam was performed according to the departmental dose-optimization program which includes automated exposure control, adjustment of the mA and/or kV according to patient size and/or use of iterative reconstruction technique. CONTRAST:  OMNIPAQUE IOHEXOL 300 MG/ML  SOLN COMPARISON:  PET-CT 09/04/2022, abdominopelvic CT 08/16/2022 and CT of the chest, abdomen and pelvis 03/21/2022. FINDINGS: CT CHEST FINDINGS Cardiovascular: No acute vascular findings. Mild aortic and great vessel atherosclerosis. The heart size is normal. There is no pericardial effusion. Mediastinum/Nodes: The hypermetabolic pretracheal node seen on the most recent PET-CT has decreased in size, now measuring 1.3 x 0.8 cm on image 21/2 (previously 1.9 x 1.2 cm, remeasured). 1.0 cm precarinal node on image 28/2 is unchanged, not hypermetabolic on PET-CT. No progressive mediastinal, hilar or axillary adenopathy identified. The thyroid gland, trachea and esophagus demonstrate no significant findings. Lungs/Pleura: No pleural effusion or pneumothorax. Stable biapical scarring with additional scattered subpleural reticulation in both lungs. No airspace disease or suspicious pulmonary nodule. Musculoskeletal/Chest wall: No chest wall mass or suspicious osseous findings. CT ABDOMEN AND PELVIS FINDINGS Hepatobiliary: The liver is normal in density without suspicious focal abnormality. No evidence of gallstones, gallbladder wall thickening or biliary dilatation. Pancreas: Unremarkable. No pancreatic ductal dilatation or surrounding inflammatory changes. Spleen: Normal in size without focal abnormality. Adrenals/Urinary Tract: Both adrenal glands appear normal. No evidence of urinary tract calculus, suspicious  renal lesion or hydronephrosis. The bladder appears normal for its degree of distention. Stomach/Bowel: No enteric contrast administered. The stomach appears unremarkable for  its degree of distension. No evidence of bowel wall thickening, distention or surrounding inflammatory change. Stable postsurgical changes from previous right hemicolectomy and ileocolonic anastomosis. Vascular/Lymphatic: The hypermetabolic aortocaval node on recent PET-CT appears slightly smaller, measuring 1.0 cm short axis on image 82/2 (previously 1.2 cm). No enlarging abdominopelvic lymph nodes are identified. Mild aortic and branch vessel atherosclerosis without evidence of large vessel occlusion or aneurysm. Reproductive: Status post hysterectomy. No evidence of adnexal mass. Other: Stable postsurgical changes in the anterior abdominal wall. No ascites, peritoneal nodularity or pneumoperitoneum. Musculoskeletal: No acute or significant osseous findings. Mild lumbar spondylosis. IMPRESSION: 1. Interval decreased size of previously hypermetabolic pretracheal and aortocaval lymph nodes consistent with response to therapy. No progressive disease identified. 2. Stable postsurgical changes from previous right hemicolectomy and ileocolonic anastomosis. 3.  Aortic Atherosclerosis (ICD10-I70.0). Electronically Signed   By: Carey Bullocks M.D.   On: 12/18/2022 16:30        Interval History: Started having occasional hot flashes more recently. Now having 3-4 a day.  Feeling really good, joie de vie again since stopped treatment. Hot flashes really picked up after came off treatment.    Past Medical/Surgical History: Past Medical History:  Diagnosis Date   Abdominal pain    Adenomatous colon polyp    Anemia    Iron deficiency anemia   Anxiety    Cancer (HCC)    colon   Colon cancer (HCC) 07/17/2013   Family history of breast cancer    Family history of colon cancer    Family history of melanoma    History of migraine headaches    Hypothyroidism    Internal hemorrhoids    Irregular heart beat    occ. 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[facility])     Past Surgical  History:  Procedure Laterality Date   BREAST BIOPSY Right    BREAST SURGERY     removal abnormal cells in right   COLONOSCOPY     multiple   ESOPHAGOGASTRODUODENOSCOPY (EGD) WITH PROPOFOL N/A 12/21/2020   Procedure: ESOPHAGOGASTRODUODENOSCOPY (EGD) WITH PROPOFOL;  Surgeon: Jeani Hawking, MD;  Location: [facility] END[facility]COPY;  Service: Endoscopy;  Laterality: N/A;   EUS N/A 12/21/2020   Procedure: UPPER ENDOSCOPIC ULTRASOUND (EUS) LINEAR;  Surgeon: Jeani Hawking, MD;  Location: [facility] ENDOSCOPY;  Service: Endoscopy;  Laterality: N/A;   LAPAROSCOPIC RIGHT COLECTOMY Right 07/17/2013   Procedure: LAPAROSCOPIC ASSISTED  RIGHT COLECTOMY;  Surgeon: Adolph Pollack, MD;  Location: [facility] ORS;  Service: General;  Laterality: Right;   TONSILLECTOMY     teenager- age 89    Family History  Problem Relation Age of Onset   Cancer Brother        internal melanoma   Cancer Maternal Grandmother        colon   Ovarian cancer Neg Hx    Endometrial cancer Neg Hx    Pancreatic cancer Neg Hx    Prostate cancer Neg Hx     Social History   Socioeconomic History   Marital status: Divorced    Spouse name: Not on file   Number of children: Not on file   Years of education: Not on file   Highest education level: Not on file  Occupational History   Occupation: owner of [employer]  Tobacco Use   Smoking status: Former  Current packs/day: 0.00    Types: Cigarettes    Quit date: 06/11/1997    Years since quitting: 25.6   Smokeless tobacco: Never  Vaping Use   Vaping status: Never Used  Substance and Sexual Activity   Alcohol use: Not Currently    Comment: 2 ounces daily - Scotch   Drug use: No   Sexual activity: Not Currently  Other Topics Concern   Not on file  Social History Narrative   Not on file   Social Determinants of Health   Financial Resource Strain: Not on file  Food Insecurity: Not on file  Transportation Needs: Not on file  Physical Activity: Not on file  Stress: Not on  file  Social Connections: Not on file    Current Medications:  Current Outpatient Medications:    Calcium Carb-Cholecalciferol (CALCIUM 600+D3 PO), Take 1 tablet by mouth in the morning and at bedtime., Disp: , Rfl:    cetirizine (ZYRTEC) 10 MG tablet, Take 10 mg by mouth every 30 (thirty) days. As needed, Disp: , Rfl:    cholecalciferol (VITAMIN D3) 25 MCG (1000 UNIT) tablet, Take 5,000 Units by mouth in the morning., Disp: , Rfl:    diazepam (VALIUM) 5 MG tablet, Take 1 tablet (5 mg total) by mouth daily as needed for anxiety., Disp: 30 tablet, Rfl:    escitalopram (LEXAPRO) 10 MG tablet, TAKE 1 & 1/2 (ONE & ONE-HALF) TABLETS BY MOUTH ONCE DAILY, Disp: 45 tablet, Rfl: 6   Ginger-Calcium Carbonate (DRAMAMINE GINGER CHEWS PO), Take by mouth., Disp: , Rfl:    Homeopathic Products (T-RELIEF ARNICA + 12) CREA, Apply topically., Disp: , Rfl:    levothyroxine (SYNTHROID, LEVOTHROID) 75 MCG tablet, Take 75 mcg by mouth daily before breakfast., Disp: , Rfl: 3   ondansetron (ZOFRAN) 8 MG tablet, Take 1 tablet (8 mg total) by mouth every 8 (eight) hours as needed for refractory nausea / vomiting. (Patient not taking: Reported on 12/05/2021), Disp: 30 tablet, Rfl: 1   oxyCODONE-acetaminophen (PERCOCET/ROXICET) 5-325 MG tablet, Take 1 tablet by mouth every 6 (six) hours as needed for severe pain., Disp: 60 tablet, Rfl: 0   Polyethyl Glycol-Propyl Glycol (SYSTANE) 0.4-0.3 % SOLN, Place 1-2 drops into both eyes 3 (three) times daily as needed (dry/irritated eyes.)., Disp: , Rfl:    prochlorperazine (COMPAZINE) 10 MG tablet, Take 1 tablet (10 mg total) by mouth every 6 (six) hours as needed (Nausea or vomiting). (Patient not taking: Reported on 12/05/2021), Disp: 30 tablet, Rfl: 1   SUNSCREEN SPF50 EX, , Disp: , Rfl:    valACYclovir (VALTREX) 1000 MG tablet, Take 1 tablet (1,000 mg total) by mouth daily as needed (Fever Blister)., Disp: , Rfl:    zolpidem (AMBIEN) 10 MG tablet, Take 1 tablet (10 mg total) by  mouth at bedtime as needed. for sleep, Disp: 30 tablet, Rfl: 2  Review of Symptoms: Pertinent positives as per HPI.  Physical Exam: Deferred given limitations of phone visit.  Laboratory & Radiologic Studies: None new  Assessment & Plan: Leslie Rivera is a 77 y.o. woman with recurrent platinum sensitive ovarian cancer who recently opted not to pursue additional cancer-directed treatment now with worsening hot flashes.  Hot flashes are very much affecting her quality of life. We discussed hormonal and non hormonal treatment options with their risks and benefits. While there is some risk of HRT, she is willing to accept this risk given QOL impact hot flashes are having. We will plan to start with low dose ERT.  Prescription sent to her pharmacy - she will let me know if this patch dose does not provide enough symptom relief.  I discussed the assessment and treatment plan with the patient. The patient was provided with an opportunity to ask questions and all were answered. The patient agreed with the plan and demonstrated an understanding of the instructions.   The patient was advised to call back or see an in-person evaluation if the symptoms worsen or if the condition fails to improve as anticipated.   8 minutes of total time was spent for this patient encounter, including preparation, phone counseling with the patient and coordination of care, and documentation of the encounter.   Eugene Garnet, MD  Division of Gynecologic Oncology  Department of Obstetrics and Gynecology  [facility]

## 2023-02-06 NOTE — Telephone Encounter (Signed)
Yes, that would be great. I'll call her this am

## 2023-02-13 DIAGNOSIS — K080 Exfoliation of teeth due to systemic causes: Secondary | ICD-10-CM | POA: Diagnosis not present

## 2023-03-06 DIAGNOSIS — K080 Exfoliation of teeth due to systemic causes: Secondary | ICD-10-CM | POA: Diagnosis not present

## 2023-03-13 DIAGNOSIS — K080 Exfoliation of teeth due to systemic causes: Secondary | ICD-10-CM | POA: Diagnosis not present

## 2023-04-24 ENCOUNTER — Other Ambulatory Visit: Payer: Self-pay | Admitting: Hematology and Oncology

## 2023-05-16 NOTE — Telephone Encounter (Signed)
Telephone call  

## 2023-05-24 ENCOUNTER — Other Ambulatory Visit: Payer: Self-pay | Admitting: Hematology and Oncology

## 2023-06-25 ENCOUNTER — Other Ambulatory Visit: Payer: Self-pay | Admitting: Hematology and Oncology

## 2023-06-30 ENCOUNTER — Other Ambulatory Visit: Payer: Self-pay | Admitting: Hematology and Oncology

## 2023-07-17 DIAGNOSIS — M810 Age-related osteoporosis without current pathological fracture: Secondary | ICD-10-CM | POA: Diagnosis not present

## 2023-07-17 DIAGNOSIS — E039 Hypothyroidism, unspecified: Secondary | ICD-10-CM | POA: Diagnosis not present

## 2023-07-17 DIAGNOSIS — K080 Exfoliation of teeth due to systemic causes: Secondary | ICD-10-CM | POA: Diagnosis not present

## 2023-07-17 DIAGNOSIS — Z79899 Other long term (current) drug therapy: Secondary | ICD-10-CM | POA: Diagnosis not present

## 2023-07-17 DIAGNOSIS — C5701 Malignant neoplasm of right fallopian tube: Secondary | ICD-10-CM | POA: Diagnosis not present

## 2023-07-17 DIAGNOSIS — Z0000 Encounter for general adult medical examination without abnormal findings: Secondary | ICD-10-CM | POA: Diagnosis not present

## 2023-07-20 ENCOUNTER — Other Ambulatory Visit: Payer: Self-pay

## 2023-07-20 ENCOUNTER — Encounter (HOSPITAL_COMMUNITY): Payer: Self-pay

## 2023-07-20 ENCOUNTER — Inpatient Hospital Stay (HOSPITAL_COMMUNITY)
Admission: EM | Admit: 2023-07-20 | Discharge: 2023-07-25 | DRG: 871 | Disposition: A | Discharge status: Home Health | Payer: Medicare Other | Attending: Internal Medicine | Admitting: Internal Medicine

## 2023-07-20 ENCOUNTER — Emergency Department (HOSPITAL_COMMUNITY): Payer: Medicare Other

## 2023-07-20 DIAGNOSIS — D61810 Antineoplastic chemotherapy induced pancytopenia: Secondary | ICD-10-CM | POA: Diagnosis present

## 2023-07-20 DIAGNOSIS — J189 Pneumonia, unspecified organism: Secondary | ICD-10-CM | POA: Diagnosis not present

## 2023-07-20 DIAGNOSIS — D701 Agranulocytosis secondary to cancer chemotherapy: Secondary | ICD-10-CM | POA: Diagnosis present

## 2023-07-20 DIAGNOSIS — G629 Polyneuropathy, unspecified: Secondary | ICD-10-CM | POA: Diagnosis present

## 2023-07-20 DIAGNOSIS — E161 Other hypoglycemia: Secondary | ICD-10-CM | POA: Diagnosis not present

## 2023-07-20 DIAGNOSIS — Z8543 Personal history of malignant neoplasm of ovary: Secondary | ICD-10-CM | POA: Diagnosis not present

## 2023-07-20 DIAGNOSIS — R6521 Severe sepsis with septic shock: Principal | ICD-10-CM | POA: Diagnosis present

## 2023-07-20 DIAGNOSIS — D6959 Other secondary thrombocytopenia: Secondary | ICD-10-CM | POA: Diagnosis present

## 2023-07-20 DIAGNOSIS — R197 Diarrhea, unspecified: Secondary | ICD-10-CM

## 2023-07-20 DIAGNOSIS — Z8589 Personal history of malignant neoplasm of other organs and systems: Secondary | ICD-10-CM

## 2023-07-20 DIAGNOSIS — Z66 Do not resuscitate: Secondary | ICD-10-CM | POA: Diagnosis present

## 2023-07-20 DIAGNOSIS — E872 Acidosis, unspecified: Secondary | ICD-10-CM | POA: Diagnosis not present

## 2023-07-20 DIAGNOSIS — Z515 Encounter for palliative care: Secondary | ICD-10-CM | POA: Diagnosis not present

## 2023-07-20 DIAGNOSIS — Z90710 Acquired absence of both cervix and uterus: Secondary | ICD-10-CM

## 2023-07-20 DIAGNOSIS — Z800 Family history of malignant neoplasm of digestive organs: Secondary | ICD-10-CM

## 2023-07-20 DIAGNOSIS — Z79899 Other long term (current) drug therapy: Secondary | ICD-10-CM

## 2023-07-20 DIAGNOSIS — F419 Anxiety disorder, unspecified: Secondary | ICD-10-CM | POA: Diagnosis not present

## 2023-07-20 DIAGNOSIS — R1110 Vomiting, unspecified: Secondary | ICD-10-CM | POA: Diagnosis not present

## 2023-07-20 DIAGNOSIS — A0811 Acute gastroenteropathy due to Norwalk agent: Secondary | ICD-10-CM | POA: Diagnosis not present

## 2023-07-20 DIAGNOSIS — A419 Sepsis, unspecified organism: Principal | ICD-10-CM | POA: Diagnosis present

## 2023-07-20 DIAGNOSIS — R5381 Other malaise: Secondary | ICD-10-CM | POA: Diagnosis present

## 2023-07-20 DIAGNOSIS — R6520 Severe sepsis without septic shock: Secondary | ICD-10-CM | POA: Diagnosis not present

## 2023-07-20 DIAGNOSIS — Z803 Family history of malignant neoplasm of breast: Secondary | ICD-10-CM

## 2023-07-20 DIAGNOSIS — J9601 Acute respiratory failure with hypoxia: Secondary | ICD-10-CM | POA: Diagnosis present

## 2023-07-20 DIAGNOSIS — E860 Dehydration: Secondary | ICD-10-CM | POA: Diagnosis present

## 2023-07-20 DIAGNOSIS — N179 Acute kidney failure, unspecified: Secondary | ICD-10-CM

## 2023-07-20 DIAGNOSIS — E538 Deficiency of other specified B group vitamins: Secondary | ICD-10-CM | POA: Diagnosis present

## 2023-07-20 DIAGNOSIS — R000 Tachycardia, unspecified: Secondary | ICD-10-CM | POA: Diagnosis not present

## 2023-07-20 DIAGNOSIS — E871 Hypo-osmolality and hyponatremia: Secondary | ICD-10-CM | POA: Diagnosis not present

## 2023-07-20 DIAGNOSIS — R531 Weakness: Secondary | ICD-10-CM | POA: Diagnosis not present

## 2023-07-20 DIAGNOSIS — E878 Other disorders of electrolyte and fluid balance, not elsewhere classified: Secondary | ICD-10-CM | POA: Diagnosis not present

## 2023-07-20 DIAGNOSIS — I700 Atherosclerosis of aorta: Secondary | ICD-10-CM | POA: Diagnosis not present

## 2023-07-20 DIAGNOSIS — I499 Cardiac arrhythmia, unspecified: Secondary | ICD-10-CM | POA: Diagnosis not present

## 2023-07-20 DIAGNOSIS — E039 Hypothyroidism, unspecified: Secondary | ICD-10-CM | POA: Diagnosis not present

## 2023-07-20 DIAGNOSIS — Z85038 Personal history of other malignant neoplasm of large intestine: Secondary | ICD-10-CM

## 2023-07-20 DIAGNOSIS — I4891 Unspecified atrial fibrillation: Secondary | ICD-10-CM | POA: Diagnosis not present

## 2023-07-20 DIAGNOSIS — G4700 Insomnia, unspecified: Secondary | ICD-10-CM | POA: Diagnosis not present

## 2023-07-20 DIAGNOSIS — K5730 Diverticulosis of large intestine without perforation or abscess without bleeding: Secondary | ICD-10-CM | POA: Diagnosis not present

## 2023-07-20 DIAGNOSIS — E876 Hypokalemia: Secondary | ICD-10-CM | POA: Diagnosis not present

## 2023-07-20 DIAGNOSIS — R109 Unspecified abdominal pain: Secondary | ICD-10-CM | POA: Diagnosis not present

## 2023-07-20 DIAGNOSIS — Z888 Allergy status to other drugs, medicaments and biological substances status: Secondary | ICD-10-CM

## 2023-07-20 DIAGNOSIS — R918 Other nonspecific abnormal finding of lung field: Secondary | ICD-10-CM | POA: Diagnosis not present

## 2023-07-20 DIAGNOSIS — Z85028 Personal history of other malignant neoplasm of stomach: Secondary | ICD-10-CM

## 2023-07-20 DIAGNOSIS — E162 Hypoglycemia, unspecified: Secondary | ICD-10-CM | POA: Diagnosis present

## 2023-07-20 DIAGNOSIS — Z885 Allergy status to narcotic agent status: Secondary | ICD-10-CM

## 2023-07-20 DIAGNOSIS — Z8544 Personal history of malignant neoplasm of other female genital organs: Secondary | ICD-10-CM

## 2023-07-20 DIAGNOSIS — Z87891 Personal history of nicotine dependence: Secondary | ICD-10-CM

## 2023-07-20 DIAGNOSIS — Z881 Allergy status to other antibiotic agents status: Secondary | ICD-10-CM

## 2023-07-20 DIAGNOSIS — Z79890 Hormone replacement therapy (postmenopausal): Secondary | ICD-10-CM

## 2023-07-20 DIAGNOSIS — J168 Pneumonia due to other specified infectious organisms: Secondary | ICD-10-CM | POA: Diagnosis not present

## 2023-07-20 DIAGNOSIS — R0989 Other specified symptoms and signs involving the circulatory and respiratory systems: Secondary | ICD-10-CM | POA: Diagnosis not present

## 2023-07-20 LAB — CBC WITH DIFFERENTIAL/PLATELET
Abs Immature Granulocytes: 0.02 10*3/uL (ref 0.00–0.07)
Basophils Absolute: 0 10*3/uL (ref 0.0–0.1)
Basophils Relative: 1 %
Eosinophils Absolute: 0 10*3/uL (ref 0.0–0.5)
Eosinophils Relative: 0 %
HCT: 44.1 % (ref 36.0–46.0)
Hemoglobin: 14.4 g/dL (ref 12.0–15.0)
Immature Granulocytes: 1 %
Lymphocytes Relative: 24 %
Lymphs Abs: 0.4 10*3/uL — ABNORMAL LOW (ref 0.7–4.0)
MCH: 32.1 pg (ref 26.0–34.0)
MCHC: 32.7 g/dL (ref 30.0–36.0)
MCV: 98.4 fL (ref 80.0–100.0)
Monocytes Absolute: 0 10*3/uL — ABNORMAL LOW (ref 0.1–1.0)
Monocytes Relative: 2 %
Neutro Abs: 1.3 10*3/uL — ABNORMAL LOW (ref 1.7–7.7)
Neutrophils Relative %: 72 %
Platelets: 141 10*3/uL — ABNORMAL LOW (ref 150–400)
RBC: 4.48 MIL/uL (ref 3.87–5.11)
RDW: 13.2 % (ref 11.5–15.5)
WBC: 1.8 10*3/uL — ABNORMAL LOW (ref 4.0–10.5)
nRBC: 0 % (ref 0.0–0.2)

## 2023-07-20 LAB — COMPREHENSIVE METABOLIC PANEL
ALT: 46 U/L — ABNORMAL HIGH (ref 0–44)
AST: 74 U/L — ABNORMAL HIGH (ref 15–41)
Albumin: 3 g/dL — ABNORMAL LOW (ref 3.5–5.0)
Alkaline Phosphatase: 41 U/L (ref 38–126)
Anion gap: 15 (ref 5–15)
BUN: 58 mg/dL — ABNORMAL HIGH (ref 8–23)
CO2: 15 mmol/L — ABNORMAL LOW (ref 22–32)
Calcium: 7.5 mg/dL — ABNORMAL LOW (ref 8.9–10.3)
Chloride: 102 mmol/L (ref 98–111)
Creatinine, Ser: 1.69 mg/dL — ABNORMAL HIGH (ref 0.44–1.00)
GFR, Estimated: 31 mL/min — ABNORMAL LOW (ref >60)
Glucose, Bld: 137 mg/dL — ABNORMAL HIGH (ref 70–99)
Potassium: 3.1 mmol/L — ABNORMAL LOW (ref 3.5–5.1)
Sodium: 132 mmol/L — ABNORMAL LOW (ref 135–145)
Total Bilirubin: 0.7 mg/dL (ref 0.0–1.2)
Total Protein: 6.5 g/dL (ref 6.5–8.1)

## 2023-07-20 LAB — APTT: aPTT: 35 s (ref 24–36)

## 2023-07-20 LAB — BLOOD GAS, VENOUS
Acid-base deficit: 7.5 mmol/L — ABNORMAL HIGH (ref 0.0–2.0)
Bicarbonate: 17.4 mmol/L — ABNORMAL LOW (ref 20.0–28.0)
O2 Saturation: 35.3 %
Patient temperature: 37
pCO2, Ven: 33 mm[Hg] — ABNORMAL LOW (ref 44–60)
pH, Ven: 7.33 (ref 7.25–7.43)
pO2, Ven: <31 mm[Hg] — CL (ref 32–45)

## 2023-07-20 LAB — PROTIME-INR
INR: 1.2 (ref 0.8–1.2)
Prothrombin Time: 15.2 s (ref 11.4–15.2)

## 2023-07-20 LAB — CBG MONITORING, ED
Glucose-Capillary: 56 mg/dL — ABNORMAL LOW (ref 70–99)
Glucose-Capillary: 98 mg/dL (ref 70–99)
Glucose-Capillary: 63 mg/dL — ABNORMAL LOW (ref 70–99)
Glucose-Capillary: 105 mg/dL — ABNORMAL HIGH (ref 70–99)
Glucose-Capillary: 68 mg/dL — ABNORMAL LOW (ref 70–99)

## 2023-07-20 LAB — I-STAT CG4 LACTIC ACID, ED
Lactic Acid, Venous: 5 mmol/L (ref 0.5–1.9)
Lactic Acid, Venous: 2.6 mmol/L (ref 0.5–1.9)

## 2023-07-20 LAB — TSH: TSH: 3.078 u[IU]/mL (ref 0.350–4.500)

## 2023-07-20 MED ORDER — LACTATED RINGERS IV SOLN: INTRAVENOUS | Status: DC

## 2023-07-20 MED ORDER — ACETAMINOPHEN 650 MG RE SUPP: 650.0000 mg | Freq: Four times a day (QID) | RECTAL | Status: DC | PRN

## 2023-07-20 MED ORDER — DEXTROSE IN LACTATED RINGERS 5 % IV SOLN: INTRAVENOUS | Status: AC

## 2023-07-20 MED ORDER — DEXTROSE 50 % IV SOLN
1.0000 | Freq: Once | INTRAVENOUS | Status: AC
  Administered 2023-07-20: 50 mL via INTRAVENOUS
  Filled 2023-07-20: qty 50

## 2023-07-20 MED ORDER — SODIUM CHLORIDE 0.9 % IV SOLN
2.0000 g | Freq: Once | INTRAVENOUS | Status: AC
  Administered 2023-07-20: 2 g via INTRAVENOUS
  Filled 2023-07-20: qty 12.5

## 2023-07-20 MED ORDER — INSULIN ASPART 100 UNIT/ML IJ SOLN
0.0000 [IU] | INTRAMUSCULAR | Status: DC
Start: 2023-07-20 — End: 2023-07-25
  Filled 2023-07-20: qty 0.15

## 2023-07-20 MED ORDER — ENOXAPARIN SODIUM 30 MG/0.3ML IJ SOSY
30.0000 mg | PREFILLED_SYRINGE | INTRAMUSCULAR | Status: DC
  Administered 2023-07-20: 30 mg via SUBCUTANEOUS
  Filled 2023-07-20: qty 0.3

## 2023-07-20 MED ORDER — DIAZEPAM 5 MG PO TABS
5.0000 mg | ORAL_TABLET | Freq: Every day | ORAL | Status: DC | PRN
  Administered 2023-07-21 – 2023-07-22 (×2): 5 mg via ORAL
  Filled 2023-07-20 (×2): qty 1

## 2023-07-20 MED ORDER — LACTATED RINGERS IV BOLUS (SEPSIS)
1000.0000 mL | Freq: Once | INTRAVENOUS | Status: AC
Start: 2023-07-20 — End: 2023-07-20
  Administered 2023-07-20: 1000 mL via INTRAVENOUS

## 2023-07-20 MED ORDER — SODIUM CHLORIDE 0.9 % IV SOLN
12.5000 mg | Freq: Once | INTRAVENOUS | Status: AC
  Administered 2023-07-20: 12.5 mg via INTRAVENOUS
  Filled 2023-07-20: qty 12.5

## 2023-07-20 MED ORDER — SODIUM CHLORIDE 0.9 % IV SOLN
100.0000 mg | Freq: Two times a day (BID) | INTRAVENOUS | Status: DC
  Administered 2023-07-20 – 2023-07-21 (×2): 100 mg via INTRAVENOUS
  Filled 2023-07-20 (×2): qty 100

## 2023-07-20 MED ORDER — MORPHINE SULFATE (PF) 4 MG/ML IV SOLN
4.0000 mg | Freq: Once | INTRAVENOUS | Status: AC
  Administered 2023-07-20: 4 mg via INTRAVENOUS
  Filled 2023-07-20: qty 1

## 2023-07-20 MED ORDER — SENNOSIDES-DOCUSATE SODIUM 8.6-50 MG PO TABS: 1.0000 | ORAL_TABLET | Freq: Every evening | ORAL | Status: DC | PRN

## 2023-07-20 MED ORDER — ENSURE ENLIVE PO LIQD
237.0000 mL | Freq: Two times a day (BID) | ORAL | Status: DC
  Filled 2023-07-20 (×2): qty 237

## 2023-07-20 MED ORDER — LEVOTHYROXINE SODIUM 50 MCG PO TABS
75.0000 ug | ORAL_TABLET | Freq: Every day | ORAL | Status: DC
  Administered 2023-07-21 – 2023-07-25 (×5): 75 ug via ORAL
  Filled 2023-07-20 (×6): qty 1

## 2023-07-20 MED ORDER — ACETAMINOPHEN 325 MG PO TABS
650.0000 mg | ORAL_TABLET | Freq: Four times a day (QID) | ORAL | Status: DC | PRN
Start: 2023-07-20 — End: 2023-07-25
  Administered 2023-07-20 – 2023-07-24 (×7): 650 mg via ORAL
  Filled 2023-07-20 (×7): qty 2

## 2023-07-20 MED ORDER — METRONIDAZOLE 500 MG/100ML IV SOLN
500.0000 mg | Freq: Once | INTRAVENOUS | Status: AC
  Administered 2023-07-20: 500 mg via INTRAVENOUS
  Filled 2023-07-20: qty 100

## 2023-07-20 MED ORDER — POTASSIUM CHLORIDE CRYS ER 20 MEQ PO TBCR
40.0000 meq | EXTENDED_RELEASE_TABLET | Freq: Once | ORAL | Status: AC
  Administered 2023-07-20: 40 meq via ORAL
  Filled 2023-07-20: qty 2

## 2023-07-20 MED ORDER — LACTATED RINGERS IV BOLUS (SEPSIS)
1000.0000 mL | Freq: Once | INTRAVENOUS | Status: AC
  Administered 2023-07-20: 1000 mL via INTRAVENOUS

## 2023-07-20 MED ORDER — ESCITALOPRAM OXALATE 10 MG PO TABS
15.0000 mg | ORAL_TABLET | Freq: Every day | ORAL | Status: DC
  Administered 2023-07-21 – 2023-07-24 (×4): 15 mg via ORAL
  Filled 2023-07-20 (×4): qty 2

## 2023-07-20 MED ORDER — LACTATED RINGERS IV BOLUS (SEPSIS): 1000.0000 mL | Freq: Once | INTRAVENOUS | Status: DC

## 2023-07-20 MED ORDER — SODIUM CHLORIDE 0.9 % IV SOLN
1.0000 g | INTRAVENOUS | Status: AC
  Administered 2023-07-21 – 2023-07-24 (×4): 1 g via INTRAVENOUS
  Filled 2023-07-20 (×5): qty 10

## 2023-07-20 MED ORDER — ZOLPIDEM TARTRATE 5 MG PO TABS
5.0000 mg | ORAL_TABLET | Freq: Every evening | ORAL | Status: DC | PRN
  Administered 2023-07-20 – 2023-07-24 (×5): 5 mg via ORAL
  Filled 2023-07-20 (×5): qty 1

## 2023-07-20 MED ORDER — LACTATED RINGERS IV BOLUS (SEPSIS)
250.0000 mL | Freq: Once | INTRAVENOUS | Status: AC
  Administered 2023-07-20: 250 mL via INTRAVENOUS

## 2023-07-20 MED ORDER — FENTANYL CITRATE PF 50 MCG/ML IJ SOSY
50.0000 ug | PREFILLED_SYRINGE | Freq: Once | INTRAMUSCULAR | Status: AC
  Administered 2023-07-20: 50 ug via INTRAVENOUS
  Filled 2023-07-20: qty 1

## 2023-07-20 NOTE — ED Provider Notes (Addendum)
[place] EMERGENCY DEPARTMENT AT [facility] Provider Note   CSN: [id number] Arrival date & time: 07/20/23  1604     History  Chief Complaint  Patient presents with   Emesis   Weakness   Hypotension    Leslie Rivera is a 78 y.o. female.  78 year old female with past medical history of ovarian cancer presenting to the emergency department today with abdominal pain, nausea, vomiting, diarrhea.  This been going on now for the past week.  States symptoms began on Monday.  She has had multiple episodes of nonbloody, not bilious emesis.  She denies any fevers with this.  Denies any associated urinary symptoms.  She reports diffuse pain with this.  She has been having some loose stools.  Denies any recent antibiotic use.  Denies being around any known sick contacts.  She denies any exacerbating or alleviating factors.  She is here today for further evaluation regarding this.   Emesis Associated symptoms: abdominal pain and diarrhea   Weakness Associated symptoms: abdominal pain, diarrhea, nausea and vomiting        Home Medications Prior to Admission medications   Medication Sig Start Date End Date Taking? Authorizing Provider  Calcium Carb-Cholecalciferol (CALCIUM 600+D3 PO) Take 1 tablet by mouth in the morning and at bedtime.    [provider]  cetirizine (ZYRTEC) 10 MG tablet Take 10 mg by mouth every 30 (thirty) days. As needed    [provider]  cholecalciferol (VITAMIN D3) 25 MCG (1000 UNIT) tablet Take 5,000 Units by mouth in the morning.    [provider]  diazepam (VALIUM) 5 MG tablet Take 1 tablet (5 mg total) by mouth daily as needed for anxiety. 03/14/22   Artis Delay, MD  escitalopram (LEXAPRO) 10 MG tablet TAKE 1 & 1/2 (ONE & ONE-HALF) TABLETS BY MOUTH ONCE DAILY 07/02/23   Artis Delay, MD  estradiol (CLIMARA) 0.025 mg/24hr patch Place 1 patch (0.025 mg total) onto the skin once a week. 02/06/23   Carver Fila, MD   Ginger-Calcium Carbonate (DRAMAMINE GINGER CHEWS PO) Take by mouth.    Artis Delay, MD  Homeopathic Products (T-RELIEF ARNICA + 12) CREA Apply topically.    [provider]  levothyroxine (SYNTHROID, LEVOTHROID) 75 MCG tablet Take 75 mcg by mouth daily before breakfast. 04/27/15   [provider]  ondansetron (ZOFRAN) 8 MG tablet Take 1 tablet (8 mg total) by mouth every 8 (eight) hours as needed for refractory nausea / vomiting. Patient not taking: Reported on 12/05/2021 02/11/21   Artis Delay, MD  oxyCODONE-acetaminophen (PERCOCET/ROXICET) 5-325 MG tablet Take 1 tablet by mouth every 6 (six) hours as needed for severe pain. 08/18/22   Artis Delay, MD  Polyethyl Glycol-Propyl Glycol (SYSTANE) 0.4-0.3 % SOLN Place 1-2 drops into both eyes 3 (three) times daily as needed (dry/irritated eyes.).    [provider]  prochlorperazine (COMPAZINE) 10 MG tablet Take 1 tablet (10 mg total) by mouth every 6 (six) hours as needed (Nausea or vomiting). Patient not taking: Reported on 12/05/2021 02/11/21   Artis Delay, MD  SUNSCREEN [phone]     [provider]  valACYclovir (VALTREX) 1000 MG tablet Take 1 tablet (1,000 mg total) by mouth daily as needed (Fever Blister). 03/14/22   Artis Delay, MD  zolpidem (AMBIEN) 10 MG tablet TAKE 1 TABLET BY MOUTH AT BEDTIME AS NEEDED FOR SLEEP 06/25/23   Artis Delay, MD      Allergies    Barium-containing compounds, Carboplatin,  Hydrocodone, Erythromycin, and Zofran [ondansetron]    Review of Systems   Review of Systems  Gastrointestinal:  Positive for abdominal pain, diarrhea, nausea and vomiting.  Neurological:  Positive for weakness.  All other systems reviewed and are negative.   Physical Exam Updated Vital Signs BP (!) 159/132 (BP Location: Right Arm)   Pulse (!) 123   Temp (!) 97.4 F (36.3 C) (Oral)   Ht 5\' 5"  (1.651 m)   Wt 74.8 kg   BMI 27.46 kg/m  Physical Exam Vitals and nursing note reviewed.   Gen: Appears  uncomfortable, dry mucous membranes Eyes: PERRL, EOMI HEENT: no oropharyngeal swelling Neck: trachea midline Resp: clear to auscultation bilaterally Card: RRR, no murmurs, rubs, or gallops Abd: The patient is diffusely tender with mild distention, no guarding or rebound Extremities: no calf tenderness, no edema Vascular: 2+ radial pulses bilaterally, 2+ DP pulses bilaterally Neuro: No focal deficits Skin: no rashes Psyc: acting appropriately   ED Results / Procedures / Treatments   Labs (all labs ordered are listed, but only abnormal results are displayed) Labs Reviewed  COMPREHENSIVE METABOLIC PANEL - Abnormal; Notable for the following components:      Result Value   Sodium 132 (*)    Potassium 3.1 (*)    CO2 15 (*)    Glucose, Bld 137 (*)    BUN 58 (*)    Creatinine, Ser 1.69 (*)    Calcium 7.5 (*)    Albumin 3.0 (*)    AST 74 (*)    ALT 46 (*)    GFR, Estimated 31 (*)    All other components within normal limits  CBC WITH DIFFERENTIAL/PLATELET - Abnormal; Notable for the following components:   WBC 1.8 (*)    Platelets 141 (*)    Neutro Abs 1.3 (*)    Lymphs Abs 0.4 (*)    Monocytes Absolute 0.0 (*)    All other components within normal limits  I-STAT CG4 LACTIC ACID, ED - Abnormal; Notable for the following components:   Lactic Acid, Venous 5.0 (*)    All other components within normal limits  CBG MONITORING, ED - Abnormal; Notable for the following components:   Glucose-Capillary 56 (*)    All other components within normal limits  CULTURE, BLOOD (ROUTINE X 2)  CULTURE, BLOOD (ROUTINE X 2)  URINALYSIS, W/ REFLEX TO CULTURE (INFECTION SUSPECTED)  PROTIME-INR  APTT  CBG MONITORING, ED  I-STAT CG4 LACTIC ACID, ED  TYPE AND SCREEN    EKG EKG Interpretation Date/Time:  Friday July 20 2023 16:56:12 EST Ventricular Rate:  125 PR Interval:  159 QRS Duration:  83 QT Interval:  323 QTC Calculation: 466 R Axis:   147  Text Interpretation: Sinus  tachycardia Low voltage, extremity and precordial leads Nonspecific T abnormalities, diffuse leads Confirmed by Beckey Downing [phone]) on 07/20/2023 4:59:33 PM  Radiology CT ABDOMEN PELVIS WO CONTRAST Result Date: 07/20/2023 CLINICAL DATA:  History of ovarian cancer with emesis and hypotension. * Tracking Code: BO * EXAM: CT ABDOMEN AND PELVIS WITHOUT CONTRAST TECHNIQUE: Multidetector CT imaging of the abdomen and pelvis was performed following the standard protocol without IV contrast. RADIATION DOSE REDUCTION: This exam was performed according to the departmental dose-optimization program which includes automated exposure control, adjustment of the mA and/or kV according to patient size and/or use of iterative reconstruction technique. COMPARISON:  CT abdomen and pelvis dated 12/15/2022 FINDINGS: Lower chest: Small focus of ground-glass density at the left lower lung base (6:14). No pleural  effusion or pneumothorax demonstrated. Partially imaged heart size is normal. Hepatobiliary: No focal hepatic lesions. No intra or extrahepatic biliary ductal dilation. Normal gallbladder. Pancreas: No focal lesions or main ductal dilation. Spleen: Normal in size without focal abnormality. Adrenals/Urinary Tract: No adrenal nodules. No suspicious renal mass on this noncontrast enhanced examination , calculi or hydronephrosis. No focal bladder wall thickening. Stomach/Bowel: Normal appearance of the stomach. Postsurgical changes from right hemicolectomy and ileocolonic anastomosis. No evidence of bowel wall thickening, distention, or inflammatory changes. Colonic diverticulosis without acute diverticulitis. Vascular/Lymphatic: Aortic atherosclerosis. Similar 9 mm aortocaval lymph node (2:41), previously 10 mm. Reproductive: No adnexal masses. Other: No free fluid, fluid collection, or free air. Musculoskeletal: No acute or abnormal lytic or blastic osseous lesions. Multilevel degenerative changes of the partially imaged thoracic  and lumbar spine. IMPRESSION: 1. No acute abdominopelvic findings. 2. Small focus of ground-glass density at the left lower lung base, which may represent atelectasis or be infectious or inflammatory. 3. Similar 9 mm aortocaval lymph node, previously 10 mm. 4. Aortic Atherosclerosis (ICD10-I70.0). Electronically Signed   By: Agustin Cree M.D.   On: 07/20/2023 18:12   DG Chest Port 1 View Result Date: 07/20/2023 CLINICAL DATA:  Questionable sepsis - evaluate for abnormality Vomiting. EXAM: PORTABLE CHEST 1 VIEW COMPARISON:  CT 12/15/2022 FINDINGS: Low lung volumes. The heart is upper normal in size. Aortic atherosclerosis and tortuosity. Question of ill-defined opacity at the left lung base. Bronchovascular crowding. No pneumothorax or large pleural effusion. IMPRESSION: Low lung volumes. Question of ill-defined opacity at the left lung base. Electronically Signed   By: Narda Rutherford M.D.   On: 07/20/2023 17:14    Procedures Procedures    Medications Ordered in ED Medications  lactated ringers infusion (has no administration in time range)  lactated ringers bolus 1,000 mL (0 mLs Intravenous Paused 07/20/23 1850)    And  lactated ringers bolus 1,000 mL (0 mLs Intravenous Paused 07/20/23 1850)    And  lactated ringers bolus 250 mL (has no administration in time range)  doxycycline (VIBRAMYCIN) 100 mg in sodium chloride 0.9 % 250 mL IVPB (has no administration in time range)  fentaNYL (SUBLIMAZE) injection 50 mcg (50 mcg Intravenous Given 07/20/23 1652)  promethazine (PHENERGAN) 12.5 mg in sodium chloride 0.9 % 50 mL IVPB (0 mg Intravenous Stopped 07/20/23 1747)  ceFEPIme (MAXIPIME) 2 g in sodium chloride 0.9 % 100 mL IVPB (2 g Intravenous New Bag/Given 07/20/23 1848)  metroNIDAZOLE (FLAGYL) IVPB 500 mg (0 mg Intravenous Stopped 07/20/23 1830)  dextrose 50 % solution 50 mL (50 mLs Intravenous Given 07/20/23 1740)    ED Course/ Medical Decision Making/ A&P                                 Medical  Decision Making 78 year old female with past medical history of ovarian cancer and hypothyroidism presenting to the emergency department today with nausea, vomiting, and diarrhea as well as abdominal pain.  The patient was apparently hypotensive with medics initially.  I will further evaluate the patient here with a sepsis workup.  Unclear if this is actually due to to sepsis at this time versus hypovolemia given her nausea, vomiting, and diarrhea.  I will hold off on antibiotics give her IV fluids.  Her blood pressure here does seem to be actually a little elevated.  I will obtain a CT scan of her abdomen for further evaluation for obstruction, diverticulitis, colitis, perforated  viscus, or other intra-abdominal pathology.  Will give her fentanyl and Zofran for symptoms and reevaluate for ultimate disposition.  [name] lactic acid does come back elevated at 5.  The patient is covered empirically with this cefepime and Flagyl.  Sepsis fluids are ordered.  The patient CT scan does not show any acute intra-abdominal pathology.  It does show possible pneumonia.  The patient is given doxycycline in addition to the antibiotic she is received initially.  She does have a significant AKI.  Lactate is greater than 5.  Calls placed to hospitalist service for admission.  CRITICAL CARE Performed by: Durwin Glaze   Total critical care time: 36 minutes  Critical care time was exclusive of separately billable procedures and treating other patients.  Critical care was necessary to treat or prevent imminent or life-threatening deterioration.  Critical care was time spent personally by me on the following activities: development of treatment plan with patient and/or surrogate as well as nursing, discussions with consultants, evaluation of patient's response to treatment, examination of patient, obtaining history from patient or surrogate, ordering and performing treatments and interventions, ordering and review  of laboratory studies, ordering and review of radiographic studies, pulse oximetry and re-evaluation of patient's condition.   Amount and/or Complexity of Data Reviewed Labs: ordered. Radiology: ordered.  Risk Prescription drug management. Decision regarding hospitalization.           Final Clinical Impression(s) / ED Diagnoses Final diagnoses:  Septic shock (HCC)  AKI (acute kidney injury) (HCC)  Pneumonia due to infectious organism, unspecified laterality, unspecified part of lung    Rx / DC Orders ED Discharge Orders     None         Durwin Glaze, MD 07/20/23 1930    Durwin Glaze, MD 07/20/23 [phone]

## 2023-07-20 NOTE — ED Notes (Signed)
Pt is cyanotic. Nurse unable to get a consistent O2, RR and pulse. Nurse attempted both hands, toes and forehead MD made aware.

## 2023-07-20 NOTE — ED Notes (Signed)
Pt is cyanotic. Nurse unable to get a consistent O2, RR and pulse. Most consistent read is on pt forehead but is intermittent. MD made aware.

## 2023-07-20 NOTE — Sepsis Progress Note (Signed)
Elink will follow per sepsis protocol.

## 2023-07-20 NOTE — ED Notes (Signed)
Pt is cyanotic most consistent read is on pt forehead but is still intermittent, wave form is present but intermittent. MD made aware.

## 2023-07-20 NOTE — ED Triage Notes (Signed)
From home, for emesis, hypotensive, hypovolemic, aaox4, Hx ovarian cancer  20g L foot 1L bolus lr D10 150g 60/ 146HR 25 RR 4L oxygen A fib No chest pain, no sob 47 cbg

## 2023-07-20 NOTE — ED Notes (Signed)
Pt given orange juice. Will recheck cbg in 

## 2023-07-20 NOTE — H&P (Addendum)
History and Physical  Leslie Rivera AVW:[id number] DOB: 08-15-1945 DOA: 07/20/2023  PCP: Georgianne Fick, MD   Chief Complaint: N/V, diarrhea and abdominal pain  HPI: Leslie Rivera is a 78 y.o. 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 Patient reports that on Wednesday, she had a salad from [place]. That night, she started having nausea and vomiting as well as some diarrhea. She informed her PCP and she was given nausea medication that helped with the nausea and vomiting. However, she continued to have abdominal discomfort and significant diarrhea described as nonbloody. She called her sister to come to her house due to her significant weakness and overall feeling sick. Sister informs me that patient had subjective fevers and chills last night and had intermittent dry cough. Patient reports feeling weak and cold with mild abdominal discomfort but denies any dizziness, headaches, chest pain, shortness of breath, headache, bloody stools or dysuria. Her last watery stool was earlier today in the ER.  Patient was hypotensive, tachypneic, tachycardic and hypoglycemic with CBG of 47 on EMS arrival.  She received IV LR 1 L bolus 150 g of D10 and placed on 4 L [place]  ED Course: Initial vitals shows temp 97.4, RR 33, HR 123, BP 92/64. Labs significant for AKI with creatinine of 1.69, hypokalemia of 3.1, bicarb 15, worsening leukopenia with WBC of 1.8, lactic acid 5.0, CBG 56. CXR and CT A/P shows evidence of pneumonia in the left lower lung base. Sepsis protocol was activated and patient received IV fluids, IV Phenergan, IV cefepime, IV Flagyl and IV doxycycline. Patient also received 1 amp of D50 for hypoglycemia of 56 with improvement to  98. [facility] was consulted for admission  Review of Systems: Please see HPI for pertinent positives and negatives. A complete 10 system review of systems are otherwise negative.  Past Medical History:  Diagnosis Date   Abdominal pain    Adenomatous colon polyp    Anemia    Iron deficiency anemia   Anxiety    Cancer (HCC)    colon   Colon cancer (HCC) 07/17/2013   Family history of breast cancer    Family history of colon cancer    Family history of melanoma    History of migraine headaches    Hypothyroidism    Internal hemorrhoids    Irregular heart beat    occ. 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[facility])    Past Surgical History:  Procedure Laterality Date   BREAST BIOPSY Right    BREAST SURGERY     removal abnormal cells in right   COLONOSCOPY     multiple   ESOPHAGOGASTRODUODENOSCOPY (EGD) WITH PROPOFOL N/A 12/21/2020   Procedure: ESOPHAGOGASTRODUODENOSCOPY (EGD) WITH PROPOFOL;  Surgeon: Jeani Hawking, MD;  Location: [facility] ENDOSCOPY;  Service: Endoscopy;  Laterality: N/A;   EUS N/A 12/21/2020   Procedure: UPPER ENDOSCOPIC ULTRASOUND (EUS) LINEAR;  Surgeon: Jeani Hawking, MD;  Location: [facility] ENDOSCOPY;  Service: Endoscopy;  Laterality: N/A;   LAPAROSCOPIC RIGHT COLECTOMY Right 07/17/2013   Procedure: LAPAROSCOPIC ASSISTED  RIGHT COLECTOMY;  Surgeon: Adolph Pollack, MD;  Location: [facility] ORS;  Service: General;  Laterality: Right;   TONSILLECTOMY     teenager- age 31   Social History:  reports that she quit smoking  about 26 years ago. Her smoking use included cigarettes. She has never used smokeless tobacco. She reports that she does not currently use alcohol. She reports that she does not use drugs.  Allergies  Allergen Reactions   Barium-Containing Compounds     Legs and arm cramping & caused fall   Carboplatin Other (See Comments)    Hypersensitivity- see note 11/16/22   Hydrocodone Nausea And Vomiting    Can tolerate Oxycodone   Erythromycin     GI  upset   Zofran [Ondansetron]     Other reaction(s): Headache-Never wants  again    Family History  Problem Relation Age of Onset   Cancer Brother        internal melanoma   Cancer Maternal Grandmother        colon   Ovarian cancer Neg Hx    Endometrial cancer Neg Hx    Pancreatic cancer Neg Hx    Prostate cancer Neg Hx      Prior to Admission medications   Medication Sig Start Date End Date Taking? Authorizing Provider  Calcium Carb-Cholecalciferol (CALCIUM 600+D3 PO) Take 1 tablet by mouth in the morning and at bedtime.    [provider]  cetirizine (ZYRTEC) 10 MG tablet Take 10 mg by mouth every 30 (thirty) days. As needed    [provider]  cholecalciferol (VITAMIN D3) 25 MCG (1000 UNIT) tablet Take 5,000 Units by mouth in the morning.    [provider]  diazepam (VALIUM) 5 MG tablet Take 1 tablet (5 mg total) by mouth daily as needed for anxiety. 03/14/22   Artis Delay, MD  escitalopram (LEXAPRO) 10 MG tablet TAKE 1 & 1/2 (ONE & ONE-HALF) TABLETS BY MOUTH ONCE DAILY 07/02/23   Artis Delay, MD  estradiol (CLIMARA) 0.025 mg/24hr patch Place 1 patch (0.025 mg total) onto the skin once a week. 02/06/23   Carver Fila, MD  Ginger-Calcium Carbonate (DRAMAMINE GINGER CHEWS PO) Take by mouth.    Artis Delay, MD  Homeopathic Products (T-RELIEF ARNICA + 12) CREA Apply topically.    [provider]  levothyroxine (SYNTHROID, LEVOTHROID) 75 MCG tablet Take 75 mcg by mouth daily before breakfast. 04/27/15   [provider]  ondansetron (ZOFRAN) 8 MG tablet Take 1 tablet (8 mg total) by mouth every 8 (eight) hours as needed for refractory nausea / vomiting. Patient not taking: Reported on 12/05/2021 02/11/21   Artis Delay, MD  oxyCODONE-acetaminophen (PERCOCET/ROXICET) 5-325 MG tablet Take 1 tablet by mouth every 6 (six) hours as needed for severe pain. 08/18/22   Artis Delay, MD  Polyethyl Glycol-Propyl Glycol (SYSTANE) 0.4-0.3 % SOLN Place 1-2  drops into both eyes 3 (three) times daily as needed (dry/irritated eyes.).    [provider]  prochlorperazine (COMPAZINE) 10 MG tablet Take 1 tablet (10 mg total) by mouth every 6 (six) hours as needed (Nausea or vomiting). Patient not taking: Reported on 12/05/2021 02/11/21   Artis Delay, MD  SUNSCREEN [phone] EX     [provider]  valACYclovir (VALTREX) 1000 MG tablet Take 1 tablet (1,000 mg total) by mouth daily as needed (Fever Blister). 03/14/22   Artis Delay, MD  zolpidem (AMBIEN) 10 MG tablet TAKE 1 TABLET BY MOUTH AT BEDTIME AS NEEDED FOR SLEEP 06/25/23   Artis Delay, MD    Physical Exam: BP (!) 141/113 (BP Location: Right Arm)   Pulse (!) 133   Temp 98.4 F (36.9 C) (Oral)   Resp 20   Ht 5'  5" (1.651 m)   Wt 74.8 kg   SpO2 95%   BMI 27.46 kg/m  General: Pleasant, acutely ill elderly woman laying in bed. No acute distress. HEENT: [place]/AT. Anicteric sclera. Very dry mucous membrane. CV: Tachycardic. Regular rhythm. No murmurs, rubs, or gallops. No LE edema Pulmonary: On 7 L NRB. Lungs CTAB. Normal effort. Diminished lung sounds throughout.  No wheezing or rales. Abdominal: Soft, nontender, nondistended. Hyperactive bowel sounds. Extremities: Palpable radial and DP pulses. Cool extremities. Skin: Cool and dry. No obvious rash or lesions. Decreased skin turgor. Neuro: A&Ox3. Moves all extremities. Decreased sensation to bilateral lower extremities.  Psych: Normal mood and affect          Labs on Admission:  Basic Metabolic Panel: Recent Labs  Lab 07/20/23 1625  NA 132*  K 3.1*  CL 102  CO2 15*  GLUCOSE 137*  BUN 58*  CREATININE 1.69*  CALCIUM 7.5*   Liver Function Tests: Recent Labs  Lab 07/20/23 1625  AST 74*  ALT 46*  ALKPHOS 41  BILITOT 0.7  PROT 6.5  ALBUMIN 3.0*   No results for input(s): "LIPASE", "AMYLASE" in the last 168 hours. No results for input(s): "AMMONIA" in the last 168 hours. CBC: Recent Labs  Lab 07/20/23 1625  WBC  1.8*  NEUTROABS 1.3*  HGB 14.4  HCT 44.1  MCV 98.4  PLT 141*   Cardiac Enzymes: No results for input(s): "CKTOTAL", "CKMB", "CKMBINDEX", "TROPONINI" in the last 168 hours. BNP (last 3 results) No results for input(s): "BNP" in the last 8760 hours.  ProBNP (last 3 results) No results for input(s): "PROBNP" in the last 8760 hours.  CBG: Recent Labs  Lab 07/20/23 1736 07/20/23 1805  GLUCAP 56* 98    Radiological Exams on Admission: CT ABDOMEN PELVIS WO CONTRAST Result Date: 07/20/2023 CLINICAL DATA:  History of ovarian cancer with emesis and hypotension. * Tracking Code: BO * EXAM: CT ABDOMEN AND PELVIS WITHOUT CONTRAST TECHNIQUE: Multidetector CT imaging of the abdomen and pelvis was performed following the standard protocol without IV contrast. RADIATION DOSE REDUCTION: This exam was performed according to the departmental dose-optimization program which includes automated exposure control, adjustment of the mA and/or kV according to patient size and/or use of iterative reconstruction technique. COMPARISON:  CT abdomen and pelvis dated 12/15/2022 FINDINGS: Lower chest: Small focus of ground-glass density at the left lower lung base (6:14). No pleural effusion or pneumothorax demonstrated. Partially imaged heart size is normal. Hepatobiliary: No focal hepatic lesions. No intra or extrahepatic biliary ductal dilation. Normal gallbladder. Pancreas: No focal lesions or main ductal dilation. Spleen: Normal in size without focal abnormality. Adrenals/Urinary Tract: No adrenal nodules. No suspicious renal mass on this noncontrast enhanced examination , calculi or hydronephrosis. No focal bladder wall thickening. Stomach/Bowel: Normal appearance of the stomach. Postsurgical changes from right hemicolectomy and ileocolonic anastomosis. No evidence of bowel wall thickening, distention, or inflammatory changes. Colonic diverticulosis without acute diverticulitis. Vascular/Lymphatic: Aortic  atherosclerosis. Similar 9 mm aortocaval lymph node (2:41), previously 10 mm. Reproductive: No adnexal masses. Other: No free fluid, fluid collection, or free air. Musculoskeletal: No acute or abnormal lytic or blastic osseous lesions. Multilevel degenerative changes of the partially imaged thoracic and lumbar spine. IMPRESSION: 1. No acute abdominopelvic findings. 2. Small focus of ground-glass density at the left lower lung base, which may represent atelectasis or be infectious or inflammatory. 3. Similar 9 mm aortocaval lymph node, previously 10 mm. 4. Aortic Atherosclerosis (ICD10-I70.0). Electronically Signed   By: Milus Height.D.  On: 07/20/2023 18:12   DG Chest Port 1 View Result Date: 07/20/2023 CLINICAL DATA:  Questionable sepsis - evaluate for abnormality Vomiting. EXAM: PORTABLE CHEST 1 VIEW COMPARISON:  CT 12/15/2022 FINDINGS: Low lung volumes. The heart is upper normal in size. Aortic atherosclerosis and tortuosity. Question of ill-defined opacity at the left lung base. Bronchovascular crowding. No pneumothorax or large pleural effusion. IMPRESSION: Low lung volumes. Question of ill-defined opacity at the left lung base. Electronically Signed   By: Narda Rutherford M.D.   On: 07/20/2023 17:14   Assessment/Plan Leslie Rivera is a 78 y.o. 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 Patient meets severe sepsis criteria  with tachycardia, tachypnea, leukopenia, lactic acidosis, AKI and evidence of respiratory infection. Status post 2.25 L of IV LR in the ED with some improvement in her BP. She continues to require 7 L of O2 via mask. Lactic acid improved from 5.0-2.6. -Admit to progressive bed -IV Rocephin and doxycycline -D5LR at 125 cc/h -Follow-up UA and blood cultures -Trend CBC, fever curve -Continue supplemental O2  # AKI # Hypokalemia Bump in creatinine to 1.69 with decrease in K+ to 3.1 and bicarb to 15. This is all secondary to dehydration in the setting of nausea and vomiting and diarrhea. -Continue IV hydration as above  # Abdominal pain, N/V Patient presented with 2 days of nausea, vomiting, nonbloody diarrhea and abdominal pain after eating a salad from [employer].  CT A/P does not show any acute intra-abdominal process. Concern for possible gastroenteritis. -Check GI panel -Enteric precautions -Trend and replete electrolytes  # Hypoglycemia Found to be hypoglycemic on EMS arrival in the setting of poor p.o. intake secondary to nausea vomiting over the last 3 to 4 days. Continues to be hypoglycemic with CBGs in the 50s to 60s here in the ED. 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  She had chemotherapy but developed allergy to carboplatin last year and decided against further chemotherapy.  Patient decided she preferred a better quality of life with the rest of her time so she was referred to home-based palliative care. -Continue to follow-up with palliative care in the outpatient -Living will in place with DNR  # Pancytopenia Acquired pancytopenia secondary to chemotherapy.  CBC on admission shows improved hemoglobin  but decreased WBC to 1.8 from baseline of 3-4 and platelet slightly down to 141. -Trend CBC  # Vitamin B12 deficiency # Peripheral neuropathy -Follow-up repeat vitamin B12 levels  # Anxiety -Continue Lexapro and as needed Valium  # Insomnia -Continue Ambien as needed for sleep  # Generalized debility -Start protein supplementation -PT/OT eval and treat   DVT prophylaxis: Lovenox     Code Status: Limited: Do not attempt resuscitation (DNR) -DNR-LIMITED -Do Not Intubate/DNI   Consults called: None  Family Communication: Discussed admission with  sister at bedside  Severity of Illness: The appropriate patient status for this patient is INPATIENT. Inpatient status is judged to be reasonable and necessary in order to provide the required intensity of service to ensure the patient's safety. [name] presenting symptoms, physical exam findings, and initial radiographic and laboratory data in the context of their chronic comorbidities is felt to place them at high risk for further clinical deterioration. Furthermore, it is not anticipated that the patient will be medically stable for discharge from the hospital within 2 midnights of admission.   * I certify that at the point of admission it is my clinical judgment that the patient will require inpatient hospital care spanning beyond 2 midnights from the point of admission due to high intensity of service, high risk for further deterioration and high frequency of surveillance required.*  Level of care:  Progressive  Steffanie Rainwater, MD 07/20/2023, 8:02 PM [facility] Pager: [phone] Isaiah 41:10   If 7PM-7AM, please contact night-coverage [email or web address] Password [email or web address]

## 2023-07-20 NOTE — Sepsis Progress Note (Signed)
Notified bedside nurse of need to draw 2nd lactic acid.

## 2023-07-21 DIAGNOSIS — R197 Diarrhea, unspecified: Secondary | ICD-10-CM | POA: Diagnosis not present

## 2023-07-21 DIAGNOSIS — N179 Acute kidney failure, unspecified: Secondary | ICD-10-CM | POA: Diagnosis not present

## 2023-07-21 DIAGNOSIS — J189 Pneumonia, unspecified organism: Secondary | ICD-10-CM | POA: Diagnosis not present

## 2023-07-21 DIAGNOSIS — A419 Sepsis, unspecified organism: Secondary | ICD-10-CM | POA: Diagnosis not present

## 2023-07-21 LAB — COMPREHENSIVE METABOLIC PANEL
ALT: 46 U/L — ABNORMAL HIGH (ref 0–44)
AST: 98 U/L — ABNORMAL HIGH (ref 15–41)
Albumin: 2.5 g/dL — ABNORMAL LOW (ref 3.5–5.0)
Alkaline Phosphatase: 37 U/L — ABNORMAL LOW (ref 38–126)
Anion gap: 10 (ref 5–15)
BUN: 37 mg/dL — ABNORMAL HIGH (ref 8–23)
CO2: 15 mmol/L — ABNORMAL LOW (ref 22–32)
Calcium: 7.2 mg/dL — ABNORMAL LOW (ref 8.9–10.3)
Chloride: 110 mmol/L (ref 98–111)
Creatinine, Ser: 1.11 mg/dL — ABNORMAL HIGH (ref 0.44–1.00)
GFR, Estimated: 52 mL/min — ABNORMAL LOW (ref >60)
Glucose, Bld: 122 mg/dL — ABNORMAL HIGH (ref 70–99)
Potassium: 2.7 mmol/L — CL (ref 3.5–5.1)
Sodium: 135 mmol/L (ref 135–145)
Total Bilirubin: 0.7 mg/dL (ref 0.0–1.2)
Total Protein: 5.4 g/dL — ABNORMAL LOW (ref 6.5–8.1)

## 2023-07-21 LAB — GLUCOSE, CAPILLARY
Glucose-Capillary: 68 mg/dL — ABNORMAL LOW (ref 70–99)
Glucose-Capillary: 76 mg/dL (ref 70–99)
Glucose-Capillary: 121 mg/dL — ABNORMAL HIGH (ref 70–99)
Glucose-Capillary: 70 mg/dL (ref 70–99)
Glucose-Capillary: 79 mg/dL (ref 70–99)
Glucose-Capillary: 94 mg/dL (ref 70–99)
Glucose-Capillary: 76 mg/dL (ref 70–99)

## 2023-07-21 LAB — CBC
HCT: 38.4 % (ref 36.0–46.0)
Hemoglobin: 12.8 g/dL (ref 12.0–15.0)
MCH: 32.2 pg (ref 26.0–34.0)
MCHC: 33.3 g/dL (ref 30.0–36.0)
MCV: 96.7 fL (ref 80.0–100.0)
Platelets: 102 10*3/uL — ABNORMAL LOW (ref 150–400)
RBC: 3.97 MIL/uL (ref 3.87–5.11)
RDW: 13 % (ref 11.5–15.5)
WBC: 2.4 10*3/uL — ABNORMAL LOW (ref 4.0–10.5)
nRBC: 0 % (ref 0.0–0.2)

## 2023-07-21 LAB — VITAMIN D 25 HYDROXY (VIT D DEFICIENCY, FRACTURES): Vit D, 25-Hydroxy: 36.38 ng/mL (ref 30–100)

## 2023-07-21 LAB — CBG MONITORING, ED: Glucose-Capillary: 101 mg/dL — ABNORMAL HIGH (ref 70–99)

## 2023-07-21 LAB — TYPE AND SCREEN
ABO/RH(D): O NEG
Antibody Screen: NEGATIVE

## 2023-07-21 LAB — MAGNESIUM: Magnesium: 1.6 mg/dL — ABNORMAL LOW (ref 1.7–2.4)

## 2023-07-21 LAB — T4, FREE: Free T4: 0.98 ng/dL (ref 0.61–1.12)

## 2023-07-21 LAB — PHOSPHORUS: Phosphorus: 2.2 mg/dL — ABNORMAL LOW (ref 2.5–4.6)

## 2023-07-21 LAB — VITAMIN B12: Vitamin B-12: 578 pg/mL (ref 180–914)

## 2023-07-21 MED ORDER — POTASSIUM PHOSPHATES 15 MMOLE/5ML IV SOLN
30.0000 mmol | Freq: Once | INTRAVENOUS | Status: AC
  Administered 2023-07-21: 30 mmol via INTRAVENOUS
  Filled 2023-07-21: qty 10

## 2023-07-21 MED ORDER — PHENOL 1.4 % MT LIQD
1.0000 | OROMUCOSAL | Status: DC | PRN
  Administered 2023-07-21: 1 via OROMUCOSAL
  Filled 2023-07-21: qty 177

## 2023-07-21 MED ORDER — ENOXAPARIN SODIUM 40 MG/0.4ML IJ SOSY
40.0000 mg | PREFILLED_SYRINGE | INTRAMUSCULAR | Status: DC
  Administered 2023-07-21 – 2023-07-24 (×4): 40 mg via SUBCUTANEOUS
  Filled 2023-07-21 (×4): qty 0.4

## 2023-07-21 MED ORDER — DOXYCYCLINE HYCLATE 100 MG PO TABS
100.0000 mg | ORAL_TABLET | Freq: Two times a day (BID) | ORAL | Status: AC
  Administered 2023-07-21 – 2023-07-24 (×7): 100 mg via ORAL
  Filled 2023-07-21 (×7): qty 1

## 2023-07-21 MED ORDER — POTASSIUM CHLORIDE CRYS ER 20 MEQ PO TBCR
40.0000 meq | EXTENDED_RELEASE_TABLET | Freq: Three times a day (TID) | ORAL | Status: AC
  Administered 2023-07-21 (×3): 40 meq via ORAL
  Filled 2023-07-21 (×3): qty 2

## 2023-07-21 MED ORDER — MAGNESIUM SULFATE 2 GM/50ML IV SOLN
2.0000 g | Freq: Once | INTRAVENOUS | Status: AC
  Administered 2023-07-21: 2 g via INTRAVENOUS
  Filled 2023-07-21: qty 50

## 2023-07-21 MED ORDER — POTASSIUM CHLORIDE CRYS ER 20 MEQ PO TBCR: 40.0000 meq | EXTENDED_RELEASE_TABLET | Freq: Two times a day (BID) | ORAL | Status: DC

## 2023-07-21 MED ORDER — GABAPENTIN 100 MG PO CAPS
100.0000 mg | ORAL_CAPSULE | Freq: Three times a day (TID) | ORAL | Status: DC
  Administered 2023-07-21 – 2023-07-22 (×5): 100 mg via ORAL
  Filled 2023-07-21 (×10): qty 1

## 2023-07-21 NOTE — Progress Notes (Signed)
PROGRESS NOTE                                                                                                                                                                                                             Patient Demographics:    Leslie Rivera, is a 78 y.o. female, DOB - [birth year], WUJ:[id number]  Outpatient Primary MD for the patient is Georgianne Fick, MD    LOS - 1  Admit date - 07/20/2023    Chief Complaint  Patient presents with   Emesis   Weakness   Hypotension       Brief Narrative (HPI from H&P)   Leslie Rivera is a 78 y.o. 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 GI panel was collected and currently pending. Admitted for severe sepsis in the setting of community-acquired pneumonia and possible gastroenteritis.    Subjective:    Leslie Rivera was evaluated today with sister at bedside. She reports feeling much better compared to yesterday but had multiple episodes of diarrhea overnight. She continues to feel weak but has good appetite and waiting for her breakfast. Chills have improved, no fevers overnight.  She is down to 2 L [place]. She will love to go home on her birthday tomorrow and understands if she needs to stay longer to get better.   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 Patient meets severe sepsis criteria with tachycardia, tachypnea, leukopenia,  lactic acidosis, AKI and evidence of respiratory infection. Improving this morning. Lactic acid trending down. She is down to 2 L [place]. -Continue IV Rocephin and doxycycline -Continue D5LR at 125 cc/h -Blood culture with no growth after 24 hours -Trend CBC, fever curve -Continue supplemental O2 -Ambulation with pulse ox tomorrow morning   # AKI, improving Creatinine down from 1.69 on admission to 1.11. -Continue IV hydration as above  # Hypokalemia K+ dropped from 3.1 on admission to 2.7  this morning due to multiple diarrhea episodes overnight -Repleted with oral KCl 40 mEq x 3 doses -Follow-up morning BMP, mag  # Hypomagnesemia Magnesium down to 1.6 in the setting of multiple diarrhea episodes -Replete with IV mag  # Hypophosphatemia Phosphorus down to 2.2 -Repleted with IV potassium phosphate   # Abdominal pain, N/V, diarrhea Patient presented with 2 days of nausea, vomiting, nonbloody diarrhea and abdominal pain after eating a salad from [employer].  CT A/P does not show any acute intra-abdominal process. Concern for possible gastroenteritis. She had 7 episodes of diarrhea overnight with 1 this morning. GI panel has been collected and currently in process. -Follow-up GI panel -Enteric precautions -Trend and replete electrolytes   # Hypoglycemia Improving. Fasting CBG of 70 this morning. She denies any nausea vomiting and would like to eat this morning. 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  She had chemotherapy but developed allergy to carboplatin last year and decided against further chemotherapy.  Patient decided she preferred a better quality of life with the rest of her time so she was  referred to home-based palliative care. -Continue to follow-up with palliative care in the outpatient -Living will in place with DNR   # Pancytopenia Acquired pancytopenia secondary to chemotherapy.  WBC slightly improved from 1.8 on admission to 2.4. Platelet down from 141-102. -Trend CBC   # Vitamin B12 deficiency # Peripheral neuropathy Vitamin B12 levels within normal at 578.  Patient continues to report neuropathic pain in her lower extremities. -Start gabapentin 100 mg 3 times daily, titrate up as needed   # Anxiety -Continue Lexapro and as needed Valium   # Insomnia -Continue Ambien as needed for sleep   # Generalized debility -Continue protein supplementation -PT/OT eval and treat   Nutrition Problem:       Estimated body mass index is 27.46 kg/m as calculated from the following:   Height as of this encounter: 5\' 5"  (1.651 m).   Weight as of this encounter: 74.8 kg.          Condition -improving  Family Communication  : Discussed plan with sister at bedside  Code Status : DNR  Consults  : None  PUD Prophylaxis : None   Procedures  :     None      Disposition Plan  :    Status is: Inpatient Remains inpatient appropriate because: Ongoing diarrhea, electrolyte abnormalities, community-acquired pneumonia, O2 requirement  DVT Prophylaxis  :    enoxaparin (LOVENOX) injection 30 mg Start: 07/20/23 2200     Lab Results  Component Value Date   PLT 102 (L) 07/21/2023    Diet :  Diet Order             Diet regular Room service appropriate? Yes; Fluid consistency: Thin  Diet effective now                    Inpatient Medications  Scheduled Meds:  enoxaparin (LOVENOX) injection  30 mg Subcutaneous Q24H   escitalopram  15 mg Oral Daily   feeding supplement  237 mL Oral BID   insulin aspart  0-15 Units Subcutaneous Q4H   levothyroxine  75 mcg Oral Q0600   potassium chloride  40 mEq Oral TID   Continuous Infusions:  cefTRIAXone  (ROCEPHIN)  IV     dextrose 5% lactated ringers 125 mL/hr at 07/21/23 0619   doxycycline (VIBRAMYCIN) IV 100 mg (07/21/23 0854)   magnesium sulfate bolus IVPB     potassium PHOSPHATE IVPB (in mmol)     PRN Meds:.acetaminophen **OR** acetaminophen, diazepam, senna-docusate, zolpidem  Antibiotics  :    Anti-infectives (From admission, onward)    Start     Dose/Rate Route Frequency Ordered Stop   07/21/23 1700  cefTRIAXone (ROCEPHIN) 1 g in sodium chloride 0.9 % 100 mL IVPB        1 g 200 mL/hr over 30 Minutes Intravenous Every 24 hours 07/20/23 2347 07/25/23 1659   07/20/23 2000  doxycycline (VIBRAMYCIN) 100 mg in sodium chloride 0.9 % 250 mL IVPB        100 mg 125 mL/hr over 120 Minutes Intravenous Every 12 hours 07/20/23 1929     07/20/23 1700  ceFEPIme (MAXIPIME) 2 g in sodium chloride 0.9 % 100 mL IVPB        2 g 200 mL/hr over 30 Minutes Intravenous  Once 07/20/23 1654 07/20/23 2000   07/20/23 1700  metroNIDAZOLE (FLAGYL) IVPB 500 mg        500 mg 100 mL/hr over 60 Minutes Intravenous  Once 07/20/23 1654 07/20/23 1830         Objective:   Vitals:   07/21/23 0100 07/21/23 0115 07/21/23 0200 07/21/23 0658  BP: (!) 96/58  100/84 (!) 108/91  Pulse: (!) 112 (!) 109 (!) 110 (!) 109  Resp: 16  20 18   Temp: 98.4 F (36.9 C)  98.5 F (36.9 C) 98.3 F (36.8 C)  TempSrc: Oral  Oral   SpO2: 91% (!) 89% 98% 97%  Weight:      Height:        Wt Readings from Last 3 Encounters:  07/20/23 74.8 kg  12/19/22 75.1 kg  11/16/22 72.8 kg     Intake/Output Summary (Last 24 hours) at 07/21/2023 1013 Last data filed at 07/21/2023 0500 Gross per 24 hour  Intake 1555.72 ml  Output --  Net 1555.72 ml     Physical Exam  General: Pleasant, acutely ill elderly woman laying in bed. No acute distress. HEENT: [place]/AT. Anicteric sclera. MMM. CV: Tachycardic. Regular rhythm. No murmurs, rubs, or gallops. No LE edema Pulmonary: On 2 L [place]. Lungs CTAB. Normal effort. No wheezing or rales.  Diminished lung sounds throughout. Abdominal: Soft, NT/ND. Normal bowel sounds. Extremities: Palpable radial and DP pulses. Mild ttp of bilateral feet.  Skin: Warm and dry. No obvious rash or lesions. Neuro: A&Ox3. Moves all extremities. Normal sensation to light touch. No focal deficit. Psych: Normal mood and affect    RN pressure injury documentation:      Data Review:    Recent Labs  Lab 07/20/23 1625 07/21/23 0617  WBC 1.8* 2.4*  HGB 14.4 12.8  HCT 44.1 38.4  PLT 141* 102*  MCV 98.4 96.7  MCH 32.1 32.2  MCHC 32.7 33.3  RDW 13.2 13.0  LYMPHSABS 0.4*  --   MONOABS 0.0*  --   EOSABS 0.0  --   BASOSABS 0.0  --     Recent Labs  Lab 07/20/23 1625 07/20/23 1650 07/20/23 2029 07/20/23 2030 07/20/23 2045 07/21/23 0617  NA 132*  --   --   --   --  135  K 3.1*  --   --   --   --  2.7*  CL 102  --   --   --   --  110  CO2 15*  --   --   --   --  15*  ANIONGAP 15  --   --   --   --  10  GLUCOSE 137*  --   --   --   --  122*  BUN 58*  --   --   --   --  37*  CREATININE 1.69*  --   --   --   --  1.11*  AST 74*  --   --   --   --  98*  ALT 46*  --   --   --   --  46*  ALKPHOS 41  --   --   --   --  37*  BILITOT 0.7  --   --   --   --  0.7  ALBUMIN 3.0*  --   --   --   --  2.5*  LATICACIDVEN  --  5.0*  --  2.6*  --   --   INR  --   --  1.2  --   --   --   TSH  --   --   --   --  3.078  --   MG  --   --   --   --   --  1.6*  CALCIUM 7.5*  --   --   --   --  7.2*      Recent Labs  Lab 07/20/23 1625 07/20/23 1650 07/20/23 2029 07/20/23 2030 07/20/23 2045 07/21/23 0617  LATICACIDVEN  --  5.0*  --  2.6*  --   --   INR  --   --  1.2  --   --   --   TSH  --   --   --   --  3.078  --   MG  --   --   --   --   --  1.6*  CALCIUM 7.5*  --   --   --   --  7.2*    --------------------------------------------------------------------------------------------------------------- No results found for: "CHOL", "HDL", "LDLCALC", "LDLDIRECT", "TRIG", "CHOLHDL"  No results  found for: "HGBA1C" Recent Labs    07/20/23 2045  TSH 3.078  FREET4 0.98   Recent Labs    07/20/23 2045  VITAMINB12 578   ------------------------------------------------------------------------------------------------------------------ Cardiac Enzymes No results for input(s): "CKMB", "TROPONINI", "MYOGLOBIN" in the last 168 hours.  Invalid input(s): "CK"  Micro Results Recent Results (from the past 240 hours)  Blood Culture (routine x 2)     Status: None (Preliminary result)   Collection Time: 07/20/23  4:37 PM   Specimen: Site Not Specified; Blood  Result Value Ref Range Status   Specimen Description   Final    SITE NOT SPECIFIED Performed at [facility], [address], [place], [place] [address]    Special Requests   Final    BOTTLES DRAWN AEROBIC AND ANAEROBIC Blood Culture adequate volume Performed at [facility], [address], [place], [place] [address]    Culture   Final    NO GROWTH < 24 HOURS Performed at [facility], [address] [place], [place] [address]    Report Status PENDING  Incomplete    Radiology Reports CT ABDOMEN PELVIS WO CONTRAST Result Date: 07/20/2023 CLINICAL DATA:  History of ovarian cancer with emesis and  hypotension. * Tracking Code: BO * EXAM: CT ABDOMEN AND PELVIS WITHOUT CONTRAST TECHNIQUE: Multidetector CT imaging of the abdomen and pelvis was performed following the standard protocol without IV contrast. RADIATION DOSE REDUCTION: This exam was performed according to the departmental dose-optimization program which includes automated exposure control, adjustment of the mA and/or kV according to patient size and/or use of iterative reconstruction technique. COMPARISON:  CT abdomen and pelvis dated 12/15/2022 FINDINGS: Lower chest: Small focus of ground-glass density at the left lower lung base (6:14). No pleural effusion or pneumothorax demonstrated. Partially imaged heart size is normal.  Hepatobiliary: No focal hepatic lesions. No intra or extrahepatic biliary ductal dilation. Normal gallbladder. Pancreas: No focal lesions or main ductal dilation. Spleen: Normal in size without focal abnormality. Adrenals/Urinary Tract: No adrenal nodules. No suspicious renal mass on this noncontrast enhanced examination , calculi or hydronephrosis. No focal bladder wall thickening. Stomach/Bowel: Normal appearance of the stomach. Postsurgical changes from right hemicolectomy and ileocolonic anastomosis. No evidence of bowel wall thickening, distention, or inflammatory changes. Colonic diverticulosis without acute diverticulitis. Vascular/Lymphatic: Aortic atherosclerosis. Similar 9 mm aortocaval lymph node (2:41), previously 10 mm. Reproductive: No adnexal masses. Other: No free fluid, fluid collection, or free air. Musculoskeletal: No acute or abnormal lytic or blastic osseous lesions. Multilevel degenerative changes of the partially imaged thoracic and lumbar spine. IMPRESSION: 1. No acute abdominopelvic findings. 2. Small focus of ground-glass density at the left lower lung base, which may represent atelectasis or be infectious or inflammatory. 3. Similar 9 mm aortocaval lymph node, previously 10 mm. 4. Aortic Atherosclerosis (ICD10-I70.0). Electronically Signed   By: Agustin Cree M.D.   On: 07/20/2023 18:12   DG Chest Port 1 View Result Date: 07/20/2023 CLINICAL DATA:  Questionable sepsis - evaluate for abnormality Vomiting. EXAM: PORTABLE CHEST 1 VIEW COMPARISON:  CT 12/15/2022 FINDINGS: Low lung volumes. The heart is upper normal in size. Aortic atherosclerosis and tortuosity. Question of ill-defined opacity at the left lung base. Bronchovascular crowding. No pneumothorax or large pleural effusion. IMPRESSION: Low lung volumes. Question of ill-defined opacity at the left lung base. Electronically Signed   By: Narda Rutherford M.D.   On: 07/20/2023 17:14      Signature  -   Steffanie Rainwater M.D on  07/21/2023 at 10:13 AM   -  To page go to [email or web address]

## 2023-07-21 NOTE — Plan of Care (Signed)
  Problem: Nutritional: Goal: Progress toward achieving an optimal weight will improve Outcome: Progressing   Problem: Tissue Perfusion: Goal: Adequacy of tissue perfusion will improve Outcome: Progressing   Problem: Nutritional: Goal: Maintenance of adequate nutrition will improve Outcome: Not Progressing   Problem: Skin Integrity: Goal: Risk for impaired skin integrity will decrease Outcome: Not Progressing

## 2023-07-21 NOTE — Evaluation (Signed)
Physical Therapy Evaluation Patient Details Name: Leslie Rivera MRN: [id number] DOB: [birth year] Today's Date: 07/21/2023  History of Present Illness  Pt is a 78yo female presenting with N/V/D abdominal pain; found to have CAP and severe sepesis with gastroenteritis. Imaging with no new acute findings.  PMH: anemia, hx of colon cancer, hypothyroidism, pancytopenia, hx of fallopian tube cancer s/p surgery and chemo  Clinical Impression  Pt received supine in bed agreeable to be seen, endorsing bilateral foot pain and swelling; inspected feet and all digits blue with pallor, lack of color, cyanosis; cool too touch, inspected fingers and same presentation, pt reporting not baseline informed nursing staff. Pt on 3LO2 via [place] SpO2 97%. Pt guided through bed mobility requiring modA for trunk elevation;  peforemd transfers at [facility] though post-transfer pt endorsing high level of fatigue. Elevated BLE on pillows with heels floated and encouraged pt to perform hourly ankle pumps, pt demonstrated 20. Pt would benefit from OPPT vs HHPT depending on progression and level of assist at home. We will continue to follow acutely.       If plan is discharge home, recommend the following: A little help with walking and/or transfers;A little help with bathing/dressing/bathroom;Help with stairs or ramp for entrance   Can travel by private vehicle        Equipment Recommendations Other (comment) (TBD depending on progression, may need RW)  Recommendations for Other Services       Functional Status Assessment Patient has had a recent decline in their functional status and demonstrates the ability to make significant improvements in function in a reasonable and predictable amount of time.     Precautions / Restrictions Precautions Precautions: Fall Precaution Comments: Levert Feinstein Restrictions Weight Bearing Restrictions Per Provider Order: No      Mobility  Bed Mobility Overal bed mobility: Needs  Assistance Bed Mobility: Supine to Sit     Supine to sit: Mod assist, HOB elevated     General bed mobility comments: For trunk elevation.    Transfers Overall transfer level: Needs assistance Equipment used: 1 person hand held assist Transfers: Sit to/from Stand, Bed to chair/wheelchair/BSC Sit to Stand: Contact guard assist Stand pivot transfers: Contact guard assist         General transfer comment: pt required CGA for STS and SPT to recliner, pt reporting "that took it all out of me." Elevated pt's BLE in recliner and with 2 pillows with heels floating.    Ambulation/Gait               General Gait Details: deferred  Stairs            Wheelchair Mobility     Tilt Bed    Modified Rankin (Stroke Patients Only)       Balance Overall balance assessment: Needs assistance Sitting-balance support: Feet supported, No upper extremity supported Sitting balance-Leahy Scale: Good     Standing balance support: During functional activity, Single extremity supported Standing balance-Leahy Scale: Fair                               Pertinent Vitals/Pain Pain Assessment Pain Assessment: 0-10 Pain Score: 8  Pain Location: bilateral feet Pain Descriptors / Indicators: Tender, Sore Pain Intervention(s): Limited activity within patient's tolerance, Monitored during session, Patient requesting pain meds-RN notified    Home Living Family/patient expects to be discharged to:: Private residence Living Arrangements: Alone Available Help at Discharge: Neighbor;Available PRN/intermittently Type  of Home: House Home Access: Stairs to enter Entrance Stairs-Rails: Left Entrance Stairs-Number of Steps: 2   Home Layout: One level Home Equipment: None      Prior Function Prior Level of Function : Independent/Modified Independent;Driving;Working/employed;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 ADLs Comments: IND     Extremity/Trunk Assessment   Upper Extremity Assessment Upper Extremity Assessment: Defer to OT evaluation    Lower Extremity Assessment Lower Extremity Assessment: RLE deficits/detail;LLE deficits/detail (Pt reporting my "feet are like blocks, they are tender to the touch, they don't want to move correctly") RLE Deficits / Details: noted to have blue extremities, toes > fingers, cool to touch, pt reporting not baseline, informed NT and RN. MMT grossly of hips/knee 3/5, DNT ankle MMT secondary to pain report. RLE Sensation: WNL LLE Deficits / Details: noted to have blue extremities, toes > fingers, cool to touch, pt reporting not baseline, informed NT and RN. MMT grossly of hips/knee 3/5, DNT ankle MMT secondary to pain report. LLE Sensation: WNL    Cervical / Trunk Assessment Cervical / Trunk Assessment: Normal  Communication   Communication Communication: No apparent difficulties  Cognition Arousal: Lethargic Behavior During Therapy: WFL for tasks assessed/performed, Anxious Overall Cognitive Status: Within Functional Limits for tasks assessed                                          General Comments      Exercises General Exercises - Lower Extremity Ankle Circles/Pumps: AROM, Both, 20 reps, Seated   Assessment/Plan    PT Assessment Patient needs continued PT services  PT Problem List Decreased strength;Decreased range of motion;Decreased activity tolerance;Decreased balance;Decreased mobility;Decreased knowledge of use of DME;Decreased safety awareness;Cardiopulmonary status limiting activity       PT Treatment Interventions DME instruction;Gait training;Functional mobility training;Therapeutic activities;Therapeutic exercise;Balance training;Neuromuscular re-education;Patient/family education    PT Goals (Current goals can be found in the Care Plan section)  Acute Rehab PT Goals Patient Stated  Goal: to get stronger and go home PT Goal Formulation: With patient Time For Goal Achievement: 08/04/23 Potential to Achieve Goals: Good    Frequency Min 1X/week     Co-evaluation               AM-PAC PT "6 Clicks" Mobility  Outcome Measure Help needed turning from your back to your side while in a flat bed without using bedrails?: A Little Help needed moving from lying on your back to sitting on the side of a flat bed without using bedrails?: A Little Help needed moving to and from a bed to a chair (including a wheelchair)?: A Little Help needed standing up from a chair using your arms (e.g., wheelchair or bedside chair)?: A Little Help needed to walk in hospital room?: A Lot Help needed climbing 3-5 steps with a railing? : A Lot 6 Click Score: 16    End of Session Equipment Utilized During Treatment: Gait belt;Oxygen (3L O2 via [place], 97% via personal pulse ox) Activity Tolerance: Patient tolerated treatment well;No increased pain Patient left: in chair;with call bell/phone within reach Nurse Communication: Mobility status PT Visit Diagnosis: Muscle weakness (generalized) (M62.81);Other abnormalities of gait and mobility (R26.89);History of falling (Z91.81)    Time: 1610-9604 PT Time Calculation (min) (ACUTE ONLY): 24  min   Charges:   PT Evaluation $PT Eval Low Complexity: 1 Low PT Treatments $Therapeutic Activity: 8-22 mins PT General Charges $$ ACUTE PT VISIT: 1 Visit         Jamesetta Geralds, PT, DPT [facility] Department Office: [phone]  Jamesetta Geralds 07/21/2023, 3:52 PM

## 2023-07-22 DIAGNOSIS — R6520 Severe sepsis without septic shock: Secondary | ICD-10-CM

## 2023-07-22 DIAGNOSIS — A419 Sepsis, unspecified organism: Secondary | ICD-10-CM | POA: Diagnosis not present

## 2023-07-22 DIAGNOSIS — Z515 Encounter for palliative care: Secondary | ICD-10-CM | POA: Diagnosis not present

## 2023-07-22 DIAGNOSIS — R197 Diarrhea, unspecified: Secondary | ICD-10-CM | POA: Diagnosis not present

## 2023-07-22 DIAGNOSIS — R531 Weakness: Secondary | ICD-10-CM

## 2023-07-22 LAB — URINALYSIS, W/ REFLEX TO CULTURE (INFECTION SUSPECTED)
Bacteria, UA: NONE SEEN
Bilirubin Urine: NEGATIVE
Glucose, UA: NEGATIVE mg/dL
Ketones, ur: NEGATIVE mg/dL
Nitrite: NEGATIVE
Protein, ur: NEGATIVE mg/dL
Specific Gravity, Urine: 1.013 (ref 1.005–1.030)
pH: 5 (ref 5.0–8.0)

## 2023-07-22 LAB — BASIC METABOLIC PANEL
Anion gap: 9 (ref 5–15)
BUN: 23 mg/dL (ref 8–23)
CO2: 13 mmol/L — ABNORMAL LOW (ref 22–32)
Calcium: 7.8 mg/dL — ABNORMAL LOW (ref 8.9–10.3)
Chloride: 114 mmol/L — ABNORMAL HIGH (ref 98–111)
Creatinine, Ser: 0.99 mg/dL (ref 0.44–1.00)
GFR, Estimated: 59 mL/min — ABNORMAL LOW (ref >60)
Glucose, Bld: 80 mg/dL (ref 70–99)
Potassium: 4.3 mmol/L (ref 3.5–5.1)
Sodium: 136 mmol/L (ref 135–145)

## 2023-07-22 LAB — GLUCOSE, CAPILLARY
Glucose-Capillary: 65 mg/dL — ABNORMAL LOW (ref 70–99)
Glucose-Capillary: 67 mg/dL — ABNORMAL LOW (ref 70–99)
Glucose-Capillary: 69 mg/dL — ABNORMAL LOW (ref 70–99)
Glucose-Capillary: 69 mg/dL — ABNORMAL LOW (ref 70–99)
Glucose-Capillary: 79 mg/dL (ref 70–99)
Glucose-Capillary: 80 mg/dL (ref 70–99)
Glucose-Capillary: 87 mg/dL (ref 70–99)
Glucose-Capillary: 96 mg/dL (ref 70–99)
Glucose-Capillary: 96 mg/dL (ref 70–99)
Glucose-Capillary: 97 mg/dL (ref 70–99)

## 2023-07-22 LAB — GASTROINTESTINAL PANEL BY PCR, STOOL (REPLACES STOOL CULTURE)

## 2023-07-22 LAB — CBC
HCT: 48.8 % — ABNORMAL HIGH (ref 36.0–46.0)
Hemoglobin: 15.9 g/dL — ABNORMAL HIGH (ref 12.0–15.0)
MCH: 31.9 pg (ref 26.0–34.0)
MCHC: 32.6 g/dL (ref 30.0–36.0)
MCV: 98 fL (ref 80.0–100.0)
Platelets: 70 10*3/uL — ABNORMAL LOW (ref 150–400)
RBC: 4.98 MIL/uL (ref 3.87–5.11)
RDW: 13.4 % (ref 11.5–15.5)
WBC: 4.4 10*3/uL (ref 4.0–10.5)
nRBC: 0 % (ref 0.0–0.2)

## 2023-07-22 LAB — PHOSPHORUS: Phosphorus: 2.3 mg/dL — ABNORMAL LOW (ref 2.5–4.6)

## 2023-07-22 LAB — C DIFFICILE QUICK SCREEN W PCR REFLEX
C Diff antigen: NEGATIVE
C Diff interpretation: NOT DETECTED
C Diff toxin: NEGATIVE

## 2023-07-22 LAB — MAGNESIUM: Magnesium: 2.2 mg/dL (ref 1.7–2.4)

## 2023-07-22 LAB — LACTIC ACID, PLASMA: Lactic Acid, Venous: 2.8 mmol/L (ref 0.5–1.9)

## 2023-07-22 MED ORDER — K PHOS MONO-SOD PHOS DI & MONO 155-852-130 MG PO TABS
500.0000 mg | ORAL_TABLET | Freq: Three times a day (TID) | ORAL | Status: AC
  Administered 2023-07-22 – 2023-07-23 (×6): 500 mg via ORAL
  Filled 2023-07-22 (×6): qty 2

## 2023-07-22 MED ORDER — SODIUM BICARBONATE 650 MG PO TABS
1300.0000 mg | ORAL_TABLET | Freq: Two times a day (BID) | ORAL | Status: DC
  Administered 2023-07-22 – 2023-07-24 (×6): 1300 mg via ORAL
  Filled 2023-07-22 (×6): qty 2

## 2023-07-22 NOTE — Hospital Course (Addendum)
79 y.o. 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 In the ED tachycardic up to 120s, afebrile hypoxic up to 88% BP stable, labs showing hyponatremia hypokalemia AKI creatinine 1.6 mild transaminitis lactic acidosis, leukopenia thrombocytopenia.  Patient was admitted for severe sepsis.  CT abdomen pelvis without contrast> no acute finding, a small focus of groundglass density in the left lower lung base, 9 mm aortocaval lymph node previously 10. Stool studies negative for CD but positive for norovirus and being managed symptomatically AKI has resolved, tolerating diet CBC remains stable with platelets improving.

## 2023-07-22 NOTE — Progress Notes (Signed)
PROGRESS NOTE JAKHIA BUXTON  WJX:[id number] DOB: [birth year] DOA: 07/20/2023 PCP: Georgianne Fick, MD  Brief Narrative/Hospital Course: 78 y.o. 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 In the ED tachycardic up to 120s, afebrile hypoxic up to 88% BP stable, labs showing hyponatremia hypokalemia AKI creatinine 1.6 mild transaminitis lactic acidosis, leukopenia thrombocytopenia.  Patient was admitted for severe sepsis.  CT abdomen pelvis without contrast> no acute finding, a small focus of groundglass density in the left lower lung base, 9 mm aortocaval lymph node previously 10.    Subjective: Seen and examined this morning Since admission vitals stable heart rate overall improved in 100, still on 2 L nasal cannula Labs shows AKI resolved sodium potassium improved bicarb still low at 13 but chloride high at 114 last lactic acid 2.8 on admission 5.0.  CBC showed WBC count improved  No nausea vomiting but having ongoing diarrhea and has a rectal tube in place no abdominal pain worsening  Assessment and Plan: Principal Problem:   Severe sepsis (HCC) Active Problems:   AKI (acute kidney injury) (HCC)   Pneumonia due to infectious organism   Hypokalemia   Diarrhea   Hypomagnesemia   Hypophosphatemia   Severe sepsis POA Left lower lobe pneumonia-CAP Acute hypoxic aspiratory failure due to pneumonia and sepsis: Patient with leukopenia from recent chemotherapy and also with acute gastroenteritis symptoms, met severe sepsis criteria.  Currently vitals are stable initial lactic acid-5 now down to 2.8, continue supplement oxygen, IV ceftriaxone, doxycycline.  Blood culture no growth so far, WBC count normalized, lactic acid still up  to 0.8 will recheck in the a.m. to ensure resolution.UA unremarkable Recent Labs  Lab 07/20/23 1625 07/20/23 1650 07/20/23 2030 07/21/23 0617 07/22/23 0726 07/22/23 0727  WBC 1.8*  --   --  2.4*  --  4.4  LATICACIDVEN  --  5.0* 2.6*  --  2.8*  --     AKI Metabolic acidosis Hypokalemia: AKI has resolved, bicarb low but with hyperchloremia, potassium is stabilized phosphorus low 2.3 add sodium bicarb encourage p.o. off IV fluids.  Diarrhea also contributing to metabolic acidosis.  Nausea vomiting diarrhea concerning for acute gastroenteritis: CT abdomen no acute finding.  GI pathogen panel pending-was collected.  Will add C. difficile as well.  Continue rectal tube  off ivf and tolerating oral well continue same   Leukopenia Thrombocytopenia: In the setting of sepsis.  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  She had chemotherapy but developed allergy to carboplatin last year and decided against further chemotherapy.Patient decided she preferred a better quality of life with the rest of her time so she was referred to home-based palliative care. Patient is DNR.  Will consult palliative care in the current situation  Vitamin B12 deficiency Peripheral neuropathy: B12 normal,.  Started on gabapentin continue  Anxiety:  Mood is stable, continue Lexapro and as needed Valium   Insomnia: Continue Ambien PRN    Generalized debility Deconditioning: PT OT consulted,Continue protein supplementation  DVT prophylaxis: enoxaparin (LOVENOX) injection 40 mg Start: 07/21/23 2200 Code Status:   Code Status: Limited: Do not attempt resuscitation (DNR) -DNR-LIMITED -Do Not Intubate/DNI  Family Communication: plan of care discussed with patient at bedside. Patient status is: Remains hospitalized because of severity of illness Level of care: Progressive   Dispo: The patient is from: Home            Anticipated disposition: TBD Objective: Vitals last 24 hrs: Vitals:   07/21/23 0900 07/21/23 1756 07/21/23 2026 07/22/23 0139  BP: 100/82 98/70 95/65  110/73  Pulse: (!) 103 (!) 102 (!) 108 100  Resp:      Temp: 97.9 F (36.6 C) 97.9 F (36.6 C)    TempSrc: Oral Oral    SpO2: 98% 96%  99%  Weight:      Height:       Weight change:   Physical Examination: General exam: alert awake,at baseline, older than stated age HEENT:Oral mucosa moist, Ear/Nose WNL grossly Respiratory system: Bilaterally clear BS,no use of accessory muscle Cardiovascular system: S1 & S2 +, No JVD. Gastrointestinal system: Abdomen soft,NT,ND, BS+ Nervous System: Alert, awake, moving all extremities,and following commands. Extremities: LE edema neg,distal peripheral pulses palpable and warm.  Skin: No rashes,no icterus. MSK: Normal muscle bulk,tone, power   Medications reviewed:  Scheduled Meds:  doxycycline  100 mg Oral Q12H   enoxaparin (LOVENOX) injection  40 mg Subcutaneous Q24H   escitalopram  15 mg Oral Daily   feeding supplement  237 mL Oral BID   gabapentin  100 mg Oral TID   insulin aspart  0-15 Units Subcutaneous Q4H   levothyroxine  75 mcg Oral Q0600   sodium bicarbonate  1,300 mg Oral BID   Continuous Infusions:  cefTRIAXone (ROCEPHIN)  IV 1 g (07/21/23 1753)    Diet Order             Diet regular Room service appropriate? Yes; Fluid consistency: Thin  Diet effective now                   Intake/Output Summary (Last 24 hours) at 07/22/2023 0948 Last data filed at 07/22/2023 0800 Gross per 24 hour  Intake 2035.79 ml  Output 120 ml  Net 1915.79 ml   Net IO Since Admission: 3,471.51 mL [07/22/23 0948]  Wt Readings from Last 3 Encounters:  07/20/23 74.8 kg  12/19/22 75.1 kg  11/16/22 72.8 kg     Unresulted Labs (From admission, onward)     Start     Ordered   07/23/23 0500  CBC  Daily,   R      07/22/23 0835   07/23/23 0500  Comprehensive metabolic panel  Daily,   R      07/22/23 0835   07/20/23 2049  Gastrointestinal Panel by PCR , Stool  (Gastrointestinal Panel by PCR, Stool                                                                                                                                                     **  Does Not include CLOSTRIDIUM DIFFICILE testing. **If CDIFF testing is needed, place order from the "C Difficile Testing" order set.**)  Once,   R        07/20/23 2048          Data Reviewed: I have personally reviewed following labs and imaging studies CBC: Recent Labs  Lab 07/20/23 1625 07/21/23 0617 07/22/23 0727  WBC 1.8* 2.4* 4.4  NEUTROABS 1.3*  --   --   HGB 14.4 12.8 15.9*  HCT 44.1 38.4 48.8*  MCV 98.4 96.7 98.0  PLT 141* 102* 70*   Basic Metabolic Panel:  Recent Labs  Lab 07/20/23 1625 07/21/23 0617 07/22/23 0727  NA 132* 135 136  K 3.1* 2.7* 4.3  CL 102 110 114*  CO2 15* 15* 13*  GLUCOSE 137* 122* 80  BUN 58* 37* 23  CREATININE 1.69* 1.11* 0.99  CALCIUM 7.5* 7.2* 7.8*  MG  --  1.6* 2.2  PHOS  --  2.2* 2.3*   GFR: Estimated Creatinine Clearance: 48.9 mL/min (by C-G formula based on SCr of 0.99 mg/dL). Liver Function Tests:  Recent Labs  Lab 07/20/23 1625 07/21/23 0617  AST 74* 98*  ALT 46* 46*  ALKPHOS 41 37*  BILITOT 0.7 0.7  PROT 6.5 5.4*  ALBUMIN 3.0* 2.5*   No results for input(s): "PROBNP" in the last 168 hours.  No results for input(s): "HGBA1C" in the last 72 hours. Recent Labs  Lab  07/22/23 0009 07/22/23 0119 07/22/23 0132 07/22/23 0432 07/22/23 0734  GLUCAP 69* 65* 96 97 87   No results for input(s): "CHOL", "HDL", "LDLCALC", "TRIG", "CHOLHDL", "LDLDIRECT" in the last 72 hours. Recent Labs    07/20/23 2045  TSH 3.078  FREET4 0.98   Sepsis Labs: Recent Labs  Lab 07/20/23 1650 07/20/23 2030 07/22/23 0726  LATICACIDVEN 5.0* 2.6* 2.8*   Recent Results (from the past 240 hours)  Blood Culture (routine x 2)     Status: None (Preliminary result)   Collection Time: 07/20/23  4:37 PM   Specimen: Site Not Specified; Blood  Result Value Ref Range Status   Specimen Description   Final    SITE NOT SPECIFIED Performed at [facility], [address] [place], [place] [address]    Special Requests   Final    BOTTLES DRAWN AEROBIC AND ANAEROBIC Blood Culture adequate volume Performed at [facility], [address] [place], [place] [address]    Culture   Final    NO GROWTH 2 DAYS Performed at [facility], [address] [place], [place] [address]    Report Status PENDING  Incomplete  Blood Culture (routine x 2)     Status: None (Preliminary result)   Collection Time: 07/21/23  6:17 AM   Specimen: BLOOD RIGHT ARM  Result Value Ref Range Status   Specimen Description   Final    BLOOD RIGHT ARM Performed at [facility], [address], [place], [place] [address]    Special Requests   Final    BOTTLES DRAWN AEROBIC AND ANAEROBIC Blood Culture results may not be optimal due to an inadequate volume of blood received in culture bottles Performed at [facility], [address] [place], [place] [address]    Culture   Final    NO GROWTH < 24 HOURS Performed at [facility], [address] [place], [place] [address]    Report Status PENDING  Incomplete  Antimicrobials/Microbiology: Anti-infectives (From admission, onward)    Start     Dose/Rate Route Frequency Ordered Stop    07/21/23 2200  doxycycline (VIBRA-TABS) tablet 100 mg        100 mg Oral Every 12 hours 07/21/23 1035     07/21/23 1700  cefTRIAXone (ROCEPHIN) 1 g in sodium chloride 0.9 % 100 mL IVPB        1 g 200 mL/hr over 30 Minutes Intravenous Every 24 hours 07/20/23 2347 07/25/23 1659   07/20/23 2000  doxycycline (VIBRAMYCIN) 100 mg in sodium chloride 0.9 % 250 mL IVPB  Status:  Discontinued        100 mg 125 mL/hr over 120 Minutes Intravenous Every 12 hours 07/20/23 1929 07/21/23 1035   07/20/23 1700  ceFEPIme (MAXIPIME) 2 g in sodium chloride 0.9 % 100 mL IVPB        2 g 200 mL/hr over 30 Minutes Intravenous  Once 07/20/23 1654 07/20/23 2000   07/20/23 1700  metroNIDAZOLE (FLAGYL) IVPB 500 mg        500 mg 100 mL/hr over 60 Minutes Intravenous  Once 07/20/23 1654 07/20/23 1830         Component Value Date/Time   SDES  07/21/2023 0617    BLOOD RIGHT ARM Performed at [facility], [address], [place], [place] [address]    SPECREQUEST  07/21/2023 0617    BOTTLES DRAWN AEROBIC AND ANAEROBIC Blood Culture results may not be optimal due to an inadequate volume of blood received in culture bottles Performed at [facility], [address], [place], [place] [address]    CULT  07/21/2023 0617    NO GROWTH < 24 HOURS Performed at [facility], [address], [place], [place] [address]    REPTSTATUS PENDING 07/21/2023 0617     Radiology Studies: CT ABDOMEN PELVIS WO CONTRAST Result Date: 07/20/2023 CLINICAL DATA:  History of ovarian cancer with emesis and hypotension. * Tracking Code: BO * EXAM: CT ABDOMEN AND PELVIS WITHOUT CONTRAST TECHNIQUE: Multidetector CT imaging of the abdomen and pelvis was performed following the standard protocol without IV contrast. RADIATION DOSE REDUCTION: This exam was performed according to the departmental dose-optimization program which includes automated exposure control, adjustment of the mA and/or kV according to patient size  and/or use of iterative reconstruction technique. COMPARISON:  CT abdomen and pelvis dated 12/15/2022 FINDINGS: Lower chest: Small focus of ground-glass density at the left lower lung base (6:14). No pleural effusion or pneumothorax demonstrated. Partially imaged heart size is normal. Hepatobiliary: No focal hepatic lesions. No intra or extrahepatic biliary ductal dilation. Normal gallbladder. Pancreas: No focal lesions or main ductal dilation. Spleen: Normal in size without focal abnormality. Adrenals/Urinary Tract: No adrenal nodules. No suspicious renal mass on this noncontrast enhanced examination , calculi or hydronephrosis. No focal bladder wall thickening. Stomach/Bowel: Normal appearance of the stomach. Postsurgical changes from right hemicolectomy and ileocolonic anastomosis. No evidence of bowel wall thickening, distention, or inflammatory changes. Colonic diverticulosis without acute diverticulitis. Vascular/Lymphatic: Aortic atherosclerosis. Similar 9 mm aortocaval lymph node (2:41), previously 10 mm. Reproductive: No adnexal masses. Other: No free fluid, fluid collection, or free air. Musculoskeletal: No acute or abnormal lytic or blastic osseous lesions. Multilevel degenerative changes of the partially imaged thoracic and lumbar spine. IMPRESSION: 1. No acute abdominopelvic findings. 2. Small focus of ground-glass density at the left lower lung base, which may represent atelectasis or be infectious or inflammatory. 3. Similar 9 mm aortocaval lymph  node, previously 10 mm. 4. Aortic Atherosclerosis (ICD10-I70.0). Electronically Signed   By: Agustin Cree M.D.   On: 07/20/2023 18:12   DG Chest Port 1 View Result Date: 07/20/2023 CLINICAL DATA:  Questionable sepsis - evaluate for abnormality Vomiting. EXAM: PORTABLE CHEST 1 VIEW COMPARISON:  CT 12/15/2022 FINDINGS: Low lung volumes. The heart is upper normal in size. Aortic atherosclerosis and tortuosity. Question of ill-defined opacity at the left lung  base. Bronchovascular crowding. No pneumothorax or large pleural effusion. IMPRESSION: Low lung volumes. Question of ill-defined opacity at the left lung base. Electronically Signed   By: Narda Rutherford M.D.   On: 07/20/2023 17:14     LOS: 2 days   Total time spent in review of labs and imaging, patient evaluation, formulation of plan, documentation and communication with family: 35 minutes  Lanae Boast, MD  [facility]  07/22/2023, 9:48 AM

## 2023-07-22 NOTE — Consult Note (Signed)
Consultation Note Date: 07/22/2023   Patient Name: Leslie Rivera  DOB: 1946/04/14  MRN: [id number]  Age / Sex: 78 y.o., female  PCP: Georgianne Fick, MD Referring Physician: Lanae Boast, MD  Reason for Consultation: Establishing goals of care  HPI/Patient Profile: 78 y.o. 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  In the ED tachycardic up to 120s, afebrile hypoxic up to 88% BP stable, labs showing hyponatremia hypokalemia AKI creatinine 1.6 mild transaminitis lactic acidosis, leukopenia thrombocytopenia.  Patient was admitted for severe sepsis.    CT abdomen pelvis without contrast> no acute finding, a small focus of groundglass density in the left lower lung base, 9 mm aortocaval lymph node previously 10.   Patient is pragmatic about her approach to her health care at this time in her life.  She is very certain that she does not want any further workup or treatment for any cancer diagnosis.  Ultimately focus of care is comfort and independence   Patient will continue to face treatment option decisions, advanced directive decisions and anticipatory care needs.     Clinical Assessment and Goals of Care:  This NP Lorinda Creed reviewed medical records, received report from team, assessed the patient and then meet at the patient's bedside along with her sister/ Rometta Emery  to discuss diagnosis, prognosis, GOC, EOL wishes disposition and options.   Concept of Palliative Care was introduced as specialized medical care for people and their families living with serious illness.  If focuses on providing relief  from the symptoms and stress of a serious illness.  The goal is to improve quality of life for both the patient and the family.  Values and goals of care important to patient and family were attempted to be elicited   Created space and opportunity for patient  and family to explore thoughts and feelings regarding current medical situation.  Patient understands her underlying cancer diagnosis and is clear that she does not want any further diagnostics or treatment regarding her cancer.    However, at this time she wishes to treat her current "infection" and rehab back to her baseline of living independently in her own home.  Her sister is her main support person.    Patient shares brief life review; her love of art and art collecting, her long career in Art gallery manager.      Patient understands the concept of palliative and hospice care.  She currently is hooked in with [facility].   She wishes to continue with them on discharge.     A  discussion was had today regarding advanced directives.  Concepts specific to code status, artifical feeding and hydration, continued IV antibiotics and rehospitalization was had.  The difference between a aggressive medical  intervention path  and a palliative comfort care path for this patient at this time was had.     MOST form revisited.   Patient currently has a MOST form in EMR   Questions and concerns addressed.  Patient  encouraged to call with questions or concerns.     PMT will continue to support holistically.             NEXT OF KIN/sister/ Rometta Emery    SUMMARY OF RECOMMENDATIONS    Code Status/Advance Care Planning: DNR    Palliative Prophylaxis:  Frequent Pain Assessment  Additional Recommendations (Limitations, Scope, Preferences): Avoid Hospitalization, No Artificial Feeding, and No Chemotherapy  Psycho-social/Spiritual:  Desire for further Chaplaincy  support:no Additional Recommendations: Education on Hospice  Prognosis:  Unable to determine  Discharge Planning: To Be Determined      Primary Diagnoses: Present on Admission:  Severe sepsis (HCC)   I have reviewed the medical record, interviewed the patient and family, and examined the patient. The following aspects are pertinent.  Past Medical History:  Diagnosis Date   Abdominal pain    Adenomatous colon polyp    Anemia    Iron deficiency anemia   Anxiety    Cancer (HCC)    colon   Colon cancer (HCC) 07/17/2013   Family history of breast cancer    Family history of colon cancer    Family history of melanoma    History of migraine headaches    Hypothyroidism    Internal hemorrhoids    Irregular heart beat    occ. 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(HCC)    Social History   Socioeconomic History   Marital status: Divorced    Spouse name: Not on file   Number of children: Not on file   Years of education: Not on file   Highest education level: Not on file  Occupational History   Occupation: owner of [employer]  Tobacco Use   Smoking status: Former    Current packs/day: 0.00    Types: Cigarettes    Quit date: 06/11/1997    Years since quitting: 26.1   Smokeless tobacco: Never  Vaping Use   Vaping status: Never Used  Substance and Sexual Activity   Alcohol use: Not Currently    Comment: 2 ounces daily - Scotch   Drug use: No   Sexual activity: Not Currently  Other Topics Concern   Not on file  Social History Narrative   Not on file   Social Drivers of Health   Financial Resource Strain: Not on file  Food Insecurity: No Food Insecurity (07/21/2023)   Hunger Vital Sign    Worried About Running Out of Food in the Last Year: Never true    Ran Out of Food in the Last Year: Never true  Transportation Needs: No Transportation Needs (07/21/2023)   PRAPARE - Administrator, Civil Service (Medical): No     Lack of Transportation (Non-Medical): No  Physical Activity: Not on file  Stress: Not on file  Social Connections: Unknown (07/21/2023)   Social Connection and Isolation Panel [NHANES]    Frequency of Communication with Friends and Family: Three times a week    Frequency of Social Gatherings with Friends and Family: Three times a week    Attends Religious Services: Patient declined    Active Member of Clubs or Organizations: No    Attends Banker Meetings: Never  Marital Status: Divorced   Family History  Problem Relation Age of Onset   Cancer Brother        internal melanoma   Cancer Maternal Grandmother        colon   Ovarian cancer Neg Hx    Endometrial cancer Neg Hx    Pancreatic cancer Neg Hx    Prostate cancer Neg Hx    Scheduled Meds:  doxycycline  100 mg Oral Q12H   enoxaparin (LOVENOX) injection  40 mg Subcutaneous Q24H   escitalopram  15 mg Oral Daily   feeding supplement  237 mL Oral BID   gabapentin  100 mg Oral TID   insulin aspart  0-15 Units Subcutaneous Q4H   levothyroxine  75 mcg Oral Q0600   phosphorus  500 mg Oral TID   sodium bicarbonate  1,300 mg Oral BID   Continuous Infusions:  cefTRIAXone (ROCEPHIN)  IV 1 g (07/21/23 1753)   PRN Meds:.acetaminophen **OR** acetaminophen, diazepam, phenol, senna-docusate, zolpidem Medications Prior to Admission:  Prior to Admission medications   Medication Sig Start Date End Date Taking? Authorizing Provider  clobetasol ointment (TEMOVATE) 0.05 % Apply 1 Application topically See admin instructions. Apply a thin layer to the vulva every day as directed   Yes [provider]  Cyanocobalamin (VITAMIN B-12) 5000 MCG SUBL Place 5,000 mcg under the tongue daily.   Yes [provider]  escitalopram (LEXAPRO) 10 MG tablet TAKE 1 & 1/2 (ONE & ONE-HALF) TABLETS BY MOUTH ONCE DAILY Patient taking differently: Take 15 mg by mouth daily. 07/02/23  Yes Gorsuch, Ni, MD  estradiol (CLIMARA) 0.025  mg/24hr patch Place 1 patch (0.025 mg total) onto the skin once a week. 02/06/23  Yes Carver Fila, MD  ibuprofen (ADVIL) 200 MG tablet Take 200 mg by mouth every 6 (six) hours as needed for mild pain (pain score 1-3) or headache (IN CONJUNCTION WITH ONE 325 MG. TYLENOL).   Yes [provider]  levothyroxine (SYNTHROID, LEVOTHROID) 75 MCG tablet Take 75 mcg by mouth daily before breakfast. 04/27/15  Yes [provider]  oxyCODONE-acetaminophen (PERCOCET/ROXICET) 5-325 MG tablet Take 1 tablet by mouth every 6 (six) hours as needed for severe pain. Patient taking differently: Take 0.25-1 tablets by mouth every 6 (six) hours as needed for severe pain (pain score 7-10). 08/18/22  Yes Bertis Ruddy, Ni, MD  prochlorperazine (COMPAZINE) 10 MG tablet Take 1 tablet (10 mg total) by mouth every 6 (six) hours as needed (Nausea or vomiting). 02/11/21  Yes Gorsuch, Ni, MD  TYLENOL 325 MG tablet Take 325 mg by mouth every 6 (six) hours as needed for mild pain (pain score 1-3) or headache (IN CONJUNCTION WITH ONE 200 MG. IBUPROFEN).   Yes [provider]  zolpidem (AMBIEN) 10 MG tablet TAKE 1 TABLET BY MOUTH AT BEDTIME AS NEEDED FOR SLEEP Patient taking differently: Take 5-10 mg by mouth at bedtime. 06/25/23  Yes Gorsuch, Ni, MD  diazepam (VALIUM) 5 MG tablet Take 1 tablet (5 mg total) by mouth daily as needed for anxiety. Patient not taking: Reported on 07/21/2023 03/14/22   Artis Delay, MD  Ginger-Calcium Carbonate (DRAMAMINE GINGER CHEWS PO) Take by mouth.    Artis Delay, MD  Polyethyl Glycol-Propyl Glycol (SYSTANE) 0.4-0.3 % SOLN Place 1-2 drops into both eyes 3 (three) times daily as needed (dry/irritated eyes.).    [provider]  valACYclovir (VALTREX) 1000 MG tablet Take 1 tablet (1,000 mg total) by mouth daily as needed (Fever Blister). Patient not taking:  Reported on 07/21/2023 03/14/22   Artis Delay, MD   Allergies  Allergen Reactions   Barium-Containing Compounds Other  (See Comments)    Legs and arm cramping & caused fall   Carboplatin Other (See Comments)    Hypersensitivity- see note 11/16/22   Hydrocodone Nausea And Vomiting and Other (See Comments)    Can tolerate Oxycodone   Erythromycin Other (See Comments)    GI upset   Zofran [Ondansetron] Other (See Comments)    Headache-Never wants  again   Review of Systems  Gastrointestinal:  Positive for diarrhea.  Neurological:  Positive for weakness.    Physical Exam Constitutional:      Appearance: She is normal weight.  Cardiovascular:     Rate and Rhythm: Normal rate.  Pulmonary:     Effort: Pulmonary effort is normal.  Skin:    General: Skin is warm and dry.  Neurological:     Mental Status: She is alert and oriented to person, place, and time.     Vital Signs: BP 97/69 (BP Location: Left Arm)   Pulse 88   Temp 98.3 F (36.8 C) (Oral)   Resp 20   Ht 5\' 5"  (1.651 m)   Wt 74.8 kg   SpO2 99%   BMI 27.46 kg/m  Pain Scale: 0-10 POSS *See Group Information*: 1-Acceptable,Awake and alert Pain Score: 3    SpO2: SpO2: 99 % O2 Device:SpO2: 99 % O2 Flow Rate: .O2 Flow Rate (L/min): 2 L/min  IO: Intake/output summary:  Intake/Output Summary (Last 24 hours) at 07/22/2023 1502 Last data filed at 07/22/2023 1034 Gross per 24 hour  Intake 1795.79 ml  Output 1120 ml  Net 675.79 ml    LBM: Last BM Date : 07/22/23 Baseline Weight: Weight: 74.8 kg Most recent weight: Weight: 74.8 kg     Palliative Assessment/Data:  60 %         PTA 90 %     Time  75 minutes Signed by: Lorinda Creed, NP   Please contact Palliative Medicine Team phone at [phone] for questions and concerns.  For individual provider: See Loretha Stapler

## 2023-07-23 DIAGNOSIS — R6520 Severe sepsis without septic shock: Secondary | ICD-10-CM | POA: Diagnosis not present

## 2023-07-23 DIAGNOSIS — A419 Sepsis, unspecified organism: Secondary | ICD-10-CM | POA: Diagnosis not present

## 2023-07-23 LAB — CBC
HCT: 41.6 % (ref 36.0–46.0)
Hemoglobin: 13.9 g/dL (ref 12.0–15.0)
MCH: 32.4 pg (ref 26.0–34.0)
MCHC: 33.4 g/dL (ref 30.0–36.0)
MCV: 97 fL (ref 80.0–100.0)
Platelets: 88 10*3/uL — ABNORMAL LOW (ref 150–400)
RBC: 4.29 MIL/uL (ref 3.87–5.11)
RDW: 13.7 % (ref 11.5–15.5)
WBC: 6.3 10*3/uL (ref 4.0–10.5)
nRBC: 0 % (ref 0.0–0.2)

## 2023-07-23 LAB — COMPREHENSIVE METABOLIC PANEL
ALT: 57 U/L — ABNORMAL HIGH (ref 0–44)
AST: 88 U/L — ABNORMAL HIGH (ref 15–41)
Albumin: 2.7 g/dL — ABNORMAL LOW (ref 3.5–5.0)
Alkaline Phosphatase: 59 U/L (ref 38–126)
Anion gap: 9 (ref 5–15)
BUN: 23 mg/dL (ref 8–23)
CO2: 16 mmol/L — ABNORMAL LOW (ref 22–32)
Calcium: 7.7 mg/dL — ABNORMAL LOW (ref 8.9–10.3)
Chloride: 111 mmol/L (ref 98–111)
Creatinine, Ser: 1.01 mg/dL — ABNORMAL HIGH (ref 0.44–1.00)
GFR, Estimated: 58 mL/min — ABNORMAL LOW (ref >60)
Glucose, Bld: 99 mg/dL (ref 70–99)
Potassium: 3.3 mmol/L — ABNORMAL LOW (ref 3.5–5.1)
Sodium: 136 mmol/L (ref 135–145)
Total Bilirubin: 0.4 mg/dL (ref 0.0–1.2)
Total Protein: 6.1 g/dL — ABNORMAL LOW (ref 6.5–8.1)

## 2023-07-23 LAB — GLUCOSE, CAPILLARY
Glucose-Capillary: 105 mg/dL — ABNORMAL HIGH (ref 70–99)
Glucose-Capillary: 107 mg/dL — ABNORMAL HIGH (ref 70–99)
Glucose-Capillary: 83 mg/dL (ref 70–99)
Glucose-Capillary: 84 mg/dL (ref 70–99)
Glucose-Capillary: 88 mg/dL (ref 70–99)
Glucose-Capillary: 93 mg/dL (ref 70–99)
Glucose-Capillary: 94 mg/dL (ref 70–99)

## 2023-07-23 MED ORDER — POTASSIUM CHLORIDE CRYS ER 20 MEQ PO TBCR
40.0000 meq | EXTENDED_RELEASE_TABLET | Freq: Once | ORAL | Status: AC
Start: 2023-07-23 — End: 2023-07-23
  Administered 2023-07-23: 40 meq via ORAL
  Filled 2023-07-23: qty 2

## 2023-07-23 MED ORDER — ZINC OXIDE 40 % EX OINT
TOPICAL_OINTMENT | Freq: Two times a day (BID) | CUTANEOUS | Status: DC | PRN
  Filled 2023-07-23: qty 57

## 2023-07-23 NOTE — Plan of Care (Signed)
Problem: Coping: Goal: Ability to adjust to condition or change in health will improve Outcome: Progressing

## 2023-07-23 NOTE — Progress Notes (Signed)
PROGRESS NOTE Leslie Rivera  WUJ:[id number] DOB: 1946/06/18 DOA: 07/20/2023 PCP: Georgianne Fick, MD  Brief Narrative/Hospital Course: 78 y.o. 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 In the ED tachycardic up to 120s, afebrile hypoxic up to 88% BP stable, labs showing hyponatremia hypokalemia AKI creatinine 1.6 mild transaminitis lactic acidosis, leukopenia thrombocytopenia.  Patient was admitted for severe sepsis.  CT abdomen pelvis without contrast> no acute finding, a small focus of groundglass density in the left lower lung base, 9 mm aortocaval lymph node previously 10. Stool studies negative for CD but positive for norovirus and being managed symptomatically AKI has resolved, tolerating diet CBC remains stable with platelets improving.    Subjective: Patient seen and examined  Still needing rectal tube for ongoing diarrhea no nausea vomiting and tolerating p.o. well.    Assessment and Plan: Principal Problem:   Severe sepsis (HCC) Active Problems:   AKI (acute kidney injury) (HCC)   Pneumonia due to infectious organism   Hypokalemia   Diarrhea   Hypomagnesemia   Hypophosphatemia   Severe sepsis POA Left lower lobe pneumonia-CAP Acute hypoxic aspiratory failure due to pneumonia and sepsis: Patient with leukopenia from recent chemotherapy and also with acute gastroenteritis symptoms, met severe sepsis criteria.  Overall doing well, continue 5 days of antibiotics to complete the course.  Blood culture no growth to date, labs improved Recent Labs  Lab 07/20/23 1625 07/20/23 1650 07/20/23 2030 07/21/23 0617 07/22/23 0726 07/22/23 0727 07/23/23 0456  WBC 1.8*  --   --  2.4*  --  4.4 6.3  LATICACIDVEN  --   5.0* 2.6*  --  2.8*  --   --     AKI Metabolic acidosis Hypokalemia: In the setting of acute gastroenteritis and norovirus infection.  Labs improved continue to replace potassium , monitor electrolytes Recent Labs    08/08/22 1013 10/05/22 0820 10/26/22 0809 11/16/22 0943 12/15/22 0838 07/20/23 1625 07/21/23 0617 07/22/23 0727 07/23/23 0456  BUN 23 24* 17 16 21  58* 37* 23 23  CREATININE 0.84 0.78 0.76 0.72 0.79 1.69* 1.11* 0.99 1.01*  CO2 25 21* 25 22 19* 15* 15* 13* 16*  K 3.7 3.9 4.9 4.1 3.8 3.1* 2.7* 4.3 3.3*   Acute gastroenteritis due to norovirus infection: Still having diarrhea but no nausea vomiting.  Continue symptomatic management rectal tube. CT abdomen no acute finding.   Leukopenia Thrombocytopenia: In the setting of sepsis.  WBC normalized platelet count improving. Recent Labs  Lab 07/21/23 0617 07/22/23 0727 07/23/23 0456  HGB 12.8 15.9* 13.9  HCT 38.4 48.8* 41.6  WBC 2.4* 4.4 6.3  PLT 102* 70* 88*    Hyponatremia Hypokalemia Hypophosphatemia Hypomagnesemia: Electrolytes overall improving.  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  She had chemotherapy but developed allergy to carboplatin last year and decided against further chemotherapy.Patient decided she preferred a better quality of life with the rest of her time so she was referred to home-based palliative care. Patient is DNR.  Palliative care input appreciated  Vitamin B12 deficiency Peripheral neuropathy: B12 normal,.  Started on gabapentin continue  Anxiety: Mood is stable, continue Lexapro and as  needed Valium   Insomnia: Continue Ambien PRN   Generalized debility Deconditioning: PT OT consulted,Continue protein supplementation  DVT  prophylaxis: enoxaparin (LOVENOX) injection 40 mg Start: 07/21/23 2200 Code Status:   Code Status: Limited: Do not attempt resuscitation (DNR) -DNR-LIMITED -Do Not Intubate/DNI  Family Communication: plan of care discussed with patient at bedside. Patient status is: Remains hospitalized because of severity of illness Level of care: Progressive   Dispo: The patient is from: Home            Anticipated disposition: Discharge in next 1 to 2 days if diarrhea improves  Objective: Vitals last 24 hrs: Vitals:   07/22/23 1256 07/22/23 2011 07/23/23 0410 07/23/23 0800  BP: 97/69 (!) 105/59 121/83   Pulse: 88 100 (!) 101   Resp: 20 20 20 19   Temp: 98.3 F (36.8 C) (!) 97.5 F (36.4 C) (!) 97.5 F (36.4 C)   TempSrc: Oral Oral Oral   SpO2: 99% 100% 98%   Weight:      Height:       Weight change:   Physical Examination: General exam: alert awake, oriented at baseline, older than stated age HEENT:Oral mucosa moist, Ear/Nose WNL grossly Respiratory system: Bilaterally clear BS,no use of accessory muscle Cardiovascular system: S1 & S2 +, No JVD. Gastrointestinal system: Abdomen soft,NT,ND, BS+ Nervous System: Alert, awake, moving all extremities,and following commands. Extremities: LE edema neg,distal peripheral pulses palpable and warm.  Skin: No rashes,no icterus. MSK: Normal muscle bulk,tone, power  Medications reviewed:  Scheduled Meds:  doxycycline  100 mg Oral Q12H   enoxaparin (LOVENOX) injection  40 mg Subcutaneous Q24H   escitalopram  15 mg Oral Daily   feeding supplement  237 mL Oral BID   gabapentin  100 mg Oral TID   insulin aspart  0-15 Units Subcutaneous Q4H   levothyroxine  75 mcg Oral Q0600   phosphorus  500 mg Oral TID   sodium bicarbonate  1,300 mg Oral BID   Continuous Infusions:  cefTRIAXone (ROCEPHIN)  IV 1 g (07/22/23 1553)    Diet Order             Diet regular Room service appropriate? Yes; Fluid consistency: Thin  Diet effective now                   Intake/Output Summary (Last 24 hours) at 07/23/2023 1117 Last data filed at 07/23/2023 0911 Gross per 24 hour  Intake 1320 ml  Output 1050 ml  Net 270 ml   Net IO Since Admission: 2,981.51 mL [07/23/23 1117]  Wt Readings from Last 3 Encounters:  07/20/23 74.8 kg  12/19/22 75.1 kg  11/16/22 72.8 kg     Unresulted Labs (From admission, onward)     Start     Ordered   07/23/23 0500  CBC  Daily,   R      07/22/23 0835   07/23/23 0500  Comprehensive metabolic panel  Daily,   R      07/22/23 0835          Data Reviewed: I have personally reviewed following labs and imaging studies CBC: Recent Labs  Lab 07/20/23 1625 07/21/23 0617 07/22/23 0727 07/23/23 0456  WBC 1.8* 2.4* 4.4 6.3  NEUTROABS 1.3*  --   --   --   HGB 14.4 12.8 15.9* 13.9  HCT 44.1 38.4 48.8* 41.6  MCV 98.4 96.7 98.0 97.0  PLT 141* 102* 70* 88*   Basic Metabolic Panel:  Recent Labs  Lab 07/20/23  1625 07/21/23 0617 07/22/23 0727 07/23/23 0456  NA 132* 135 136 136  K 3.1* 2.7* 4.3 3.3*  CL 102 110 114* 111  CO2 15* 15* 13* 16*  GLUCOSE 137* 122* 80 99  BUN 58* 37* 23 23  CREATININE 1.69* 1.11* 0.99 1.01*  CALCIUM 7.5* 7.2* 7.8* 7.7*  MG  --  1.6* 2.2  --   PHOS  --  2.2* 2.3*  --    GFR: Estimated Creatinine Clearance: 48 mL/min (A) (by C-G formula based on SCr of 1.01 mg/dL (H)). Liver Function Tests:  Recent Labs  Lab 07/20/23 1625 07/21/23 0617 07/23/23 0456  AST 74* 98* 88*  ALT 46* 46* 57*  ALKPHOS 41 37* 59  BILITOT 0.7 0.7 0.4  PROT 6.5 5.4* 6.1*  ALBUMIN 3.0* 2.5* 2.7*   No results for input(s): "PROBNP" in the last 168 hours.  No results for input(s): "HGBA1C" in the last 72 hours. Recent Labs  Lab 07/22/23 2034 07/22/23 2225 07/23/23 0004 07/23/23 0411 07/23/23 0731  GLUCAP 69* 96 93 105* 88   No results for input(s): "CHOL", "HDL", "LDLCALC", "TRIG", "CHOLHDL", "LDLDIRECT" in the last 72 hours. Recent Labs    07/20/23 2045  TSH 3.078  FREET4 0.98    Sepsis Labs: Recent Labs  Lab 07/20/23 1650 07/20/23 2030 07/22/23 0726  LATICACIDVEN 5.0* 2.6* 2.8*   Recent Results (from the past 240 hours)  Blood Culture (routine x 2)     Status: None (Preliminary result)   Collection Time: 07/20/23  4:37 PM   Specimen: Site Not Specified; Blood  Result Value Ref Range Status   Specimen Description   Final    SITE NOT SPECIFIED Performed at [facility], [address], [place], [place] [address]    Special Requests   Final    BOTTLES DRAWN AEROBIC AND ANAEROBIC Blood Culture adequate volume Performed at [facility], [address] [place], [place] [address]    Culture   Final    NO GROWTH 3 DAYS Performed at [facility], [address], [place], [place] [address]    Report Status PENDING  Incomplete  Gastrointestinal Panel by PCR , Stool     Status: Abnormal   Collection Time: 07/20/23  8:49 PM   Specimen: Stool  Result Value Ref Range Status   Campylobacter species NOT DETECTED NOT DETECTED Final   Plesimonas shigelloides NOT DETECTED NOT DETECTED Final   Salmonella species NOT DETECTED NOT DETECTED Final   Yersinia enterocolitica NOT DETECTED NOT DETECTED Final   Vibrio species NOT DETECTED NOT DETECTED Final   Vibrio cholerae NOT DETECTED NOT DETECTED Final   Enteroaggregative E coli (EAEC) NOT DETECTED NOT DETECTED Final   Enteropathogenic E coli (EPEC) NOT DETECTED NOT DETECTED Final   Enterotoxigenic E coli (ETEC) NOT DETECTED NOT DETECTED Final   Shiga like toxin producing E coli (STEC) NOT DETECTED NOT DETECTED Final   Shigella/Enteroinvasive E coli (EIEC) NOT DETECTED NOT DETECTED Final   Cryptosporidium NOT DETECTED NOT DETECTED Final   Cyclospora cayetanensis NOT DETECTED NOT DETECTED Final   Entamoeba histolytica NOT DETECTED NOT DETECTED Final   Giardia lamblia NOT DETECTED NOT DETECTED Final   Adenovirus F40/41 NOT DETECTED NOT DETECTED Final   Astrovirus NOT  DETECTED NOT DETECTED Final   Norovirus GI/GII DETECTED (A) NOT DETECTED Final    Comment: RESULT CALLED TO, READ BACK BY AND VERIFIED WITH: DIAMONE MAYS 07/22/2023 AT 1557 SRR    Rotavirus A NOT  DETECTED NOT DETECTED Final   Sapovirus (I, II, IV, and V) NOT DETECTED NOT DETECTED Final    Comment: Performed at [facility], [address] [place], [place] [address]  Blood Culture (routine x 2)     Status: None (Preliminary result)   Collection Time: 07/21/23  6:17 AM   Specimen: BLOOD RIGHT ARM  Result Value Ref Range Status   Specimen Description   Final    BLOOD RIGHT ARM Performed at [facility], [address] [place], [place] [address]    Special Requests   Final    BOTTLES DRAWN AEROBIC AND ANAEROBIC Blood Culture results may not be optimal due to an inadequate volume of blood received in culture bottles Performed at [facility], [address], [place], [place] [address]    Culture   Final    NO GROWTH 2 DAYS Performed at [facility], [address], [place], [place] [address]    Report Status PENDING  Incomplete  C Difficile Quick Screen w PCR reflex     Status: None   Collection Time: 07/22/23 10:06 AM   Specimen: STOOL  Result Value Ref Range Status   C Diff antigen NEGATIVE NEGATIVE Final   C Diff toxin NEGATIVE NEGATIVE Final   C Diff interpretation No C. difficile detected.  Final    Comment: Performed at [facility], [address] [place], [place] [address]    Antimicrobials/Microbiology: Anti-infectives (From admission, onward)    Start     Dose/Rate Route Frequency Ordered Stop   07/21/23 2200  doxycycline (VIBRA-TABS) tablet 100 mg        100 mg Oral Every 12 hours 07/21/23 1035     07/21/23 1700  cefTRIAXone (ROCEPHIN) 1 g in sodium chloride 0.9 % 100 mL IVPB        1 g 200 mL/hr over 30 Minutes Intravenous Every 24 hours 07/20/23 2347 07/25/23 1659   07/20/23 2000  doxycycline  (VIBRAMYCIN) 100 mg in sodium chloride 0.9 % 250 mL IVPB  Status:  Discontinued        100 mg 125 mL/hr over 120 Minutes Intravenous Every 12 hours 07/20/23 1929 07/21/23 1035   07/20/23 1700  ceFEPIme (MAXIPIME) 2 g in sodium chloride 0.9 % 100 mL IVPB        2 g 200 mL/hr over 30 Minutes Intravenous  Once 07/20/23 1654 07/20/23 2000   07/20/23 1700  metroNIDAZOLE (FLAGYL) IVPB 500 mg        500 mg 100 mL/hr over 60 Minutes Intravenous  Once 07/20/23 1654 07/20/23 1830         Component Value Date/Time   SDES  07/21/2023 0617    BLOOD RIGHT ARM Performed at [facility], [address], [place], [place] [address]    SPECREQUEST  07/21/2023 0617    BOTTLES DRAWN AEROBIC AND ANAEROBIC Blood Culture results may not be optimal due to an inadequate volume of blood received in culture bottles Performed at [facility], [address], [place], [place] [address]    CULT  07/21/2023 0617    NO GROWTH 2 DAYS Performed at [facility], [address] [place], [place] [address]    REPTSTATUS PENDING 07/21/2023 [phone]     Radiology Studies: No results found.    LOS: 3 days   Total time spent in review of labs and imaging, patient evaluation, formulation of plan, documentation and communication  with family: 35 minutes  Lanae Boast, MD  [facility]  07/23/2023, 11:17 AM

## 2023-07-23 NOTE — Progress Notes (Signed)
PT Cancellation Note  Patient Details Name: Leslie Rivera MRN: [id number] DOB: 1946-04-04   Cancelled Treatment:     Pt just finished working with OT sitting on EOB eating and requesting to rest.  Very attentive sister in room assisting with all needs.  "They told me I could NOT leave the oom because I have [employer]".   Pt has been amb around the room.  Will continue to follow.  LPT has rc HH/OP PT pending outcome.  Pt plans to d/c back home whn medically stable.   Felecia Shelling  PTA Acute  Rehabilitation Services Office M-F          [phone]

## 2023-07-23 NOTE — TOC Initial Note (Signed)
Transition of Care [facility]) - Initial/Assessment Note    Patient Details  Name: Leslie Rivera MRN: [id number] Date of Birth: 08-21-1945  Transition [facility] CM/SW Contact:    Lanier Clam, RN Phone Number: 07/23/2023, 1:57 PM  Clinical Narrative:  d/c home w/HHC-no preference HHPT-[facility] rep Cory following.                 Expected Discharge Plan: Home w Home Health Services Barriers to Discharge: Continued Medical Work up   Patient Goals and CMS Choice            Expected Discharge Plan and Services                                   HH Arranged: [facility] Agency: [facility] Date [name] Agency Contacted: 07/23/23 Time HH Agency Contacted: [id number] Representative spoke with at [facility] Agency: Kandee Keen  Prior Living Arrangements/Services       Do you feel safe going back to the place where you live?: Yes               Activities of Daily Living   ADL Screening (condition at time of admission) Independently performs ADLs?: Yes (appropriate for developmental age) Is the patient deaf or have difficulty hearing?: No Does the patient have difficulty seeing, even when wearing glasses/contacts?: No Does the patient have difficulty concentrating, remembering, or making decisions?: No  Permission Sought/Granted Permission sought to share information with : Case Manager                Emotional Assessment              Admission diagnosis:  AKI (acute kidney injury) (HCC) [N17.9] Septic shock (HCC) [A41.9, R65.21] Severe sepsis (HCC) [A41.9, R65.20] Pneumonia due to infectious organism, unspecified laterality, unspecified part of lung [J18.9] Patient Active Problem List   Diagnosis Date Noted   Hypomagnesemia 07/21/2023   Hypophosphatemia 07/21/2023   Severe sepsis (HCC) 07/20/2023   AKI (acute kidney injury) (HCC) 07/20/2023   Pneumonia due to infectious organism 07/20/2023   Hypokalemia 07/20/2023   Diarrhea 07/20/2023   Vitamin D deficiency  01/09/2022   Joint pain 11/07/2021   Other fatigue 11/07/2021   Vitamin B12 deficiency 10/03/2021   Postoperative anemia due to acute blood loss 05/13/2021   Postoperative abdominal pain 05/12/2021   Pancytopenia, acquired (HCC) 04/14/2021   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 [place] [address] Phone: [phone] Fax: [phone]     Social Drivers of Health (SDOH) Social History: SDOH Screenings   Food Insecurity: No Food Insecurity (07/21/2023)  Housing: Low Risk  (07/21/2023)  Transportation Needs: No Transportation Needs (07/21/2023)  Utilities: Not At Risk (07/21/2023)  Social Connections: Unknown (07/21/2023)  Tobacco Use: Medium Risk (07/20/2023)   SDOH Interventions:     Readmission Risk Interventions     No data to display

## 2023-07-23 NOTE — Evaluation (Signed)
Occupational Therapy Evaluation Patient Details Name: Leslie Rivera MRN: [id number] DOB: 12-Apr-1946 Today's Date: 07/23/2023   History of Present Illness Pt is a 78 year old female presenting with N/V/D abdominal pain; found to have CAP and severe sepesis with gastroenteritis. Imaging with no new acute findings.  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 All education has been completed and the patient has no further questions. Patient is MI with ADLs with recurrent loose stools limiting more time further from bed. Patient endorsed not needing OT at this time. See below for any follow-up Occupational Therapy or equipment needs. OT is signing off. Thank you for this referral.        If plan is discharge home, recommend the following:      Functional Status Assessment  Patient has not had a recent decline in their functional status  Equipment Recommendations  None recommended by OT    Recommendations for Other Services       Precautions / Restrictions Precautions Precautions: Fall Restrictions Weight Bearing Restrictions Per Provider Order: No      Mobility Bed Mobility Overal bed mobility: Modified Independent                  Transfers Overall transfer level: Modified independent                        Balance Overall balance assessment: No apparent balance deficits (not formally assessed)                                         ADL either performed or assessed with clinical judgement   ADL Overall ADL's : Modified independent         General ADL Comments: patient is able to transfer from bed to [facility] with MI. patient was educated on using gripper socks v.s. pad on floor to reduce falls risk. patient was educated on importance of getting out of bed and  eating each day to keep up strength. patient and sister verbalized understanding. patient has a positive try attitude during session. patient was educated on keeping the ill try attitude through life. patient reported that she always has this.patietn reported walking around in her room with sister when she feels like it. patient was able to complete hygiene and LB Dressing with MI as well.  patient endorsed not needing OT at this time.                  Pertinent Vitals/Pain Pain Assessment Pain Assessment: Faces Faces Pain Scale: Hurts a little bit Pain Location: bottom Pain Descriptors / Indicators: Sore Pain Intervention(s): Monitored during session     Extremity/Trunk Assessment Upper Extremity Assessment Upper Extremity Assessment: Overall WFL for tasks assessed              Cognition Arousal: Alert Behavior During Therapy: WFL for tasks assessed/performed Overall Cognitive Status: Within Functional Limits for tasks assessed             General Comments: sister was also present in room.                Home Living Family/patient expects to be discharged to:: Private residence Living Arrangements: Alone Available Help  at Discharge: Neighbor;Available PRN/intermittently Type of Home: House Home Access: Stairs to enter [employer] of Steps: 2 Entrance Stairs-Rails: Left Home Layout: One level     Bathroom Shower/Tub: Walk-in shower;Tub/shower unit   Bathroom Toilet: Handicapped height     Home Equipment: None          Prior Functioning/Environment Prior Level of Function : Independent/Modified Independent;Driving;Working/employed;History of Falls (last six months)             Mobility Comments: Recently fell while trying to go the bathroom. ADLs Comments: IND                 OT Goals(Current goals can be found in the care plan section) Acute Rehab OT Goals Patient Stated Goal: to get better OT Goal Formulation: All  assessment and education complete, DC therapy  OT Frequency:         AM-PAC OT "6 Clicks" Daily Activity     Outcome Measure Help from another person eating meals?: None Help from another person taking care of personal grooming?: None Help from another person toileting, which includes using toliet, bedpan, or urinal?: None Help from another person bathing (including washing, rinsing, drying)?: None Help from another person to put on and taking off regular upper body clothing?: None Help from another person to put on and taking off regular lower body clothing?: None 6 Click Score: 24   End of Session Equipment Utilized During Treatment: Other (comment) (BSC)  Activity Tolerance: Patient tolerated treatment well Patient left: in bed;with family/visitor present;with call bell/phone within reach                   Time: 1403-1430 OT Time Calculation (min): 27 min Charges:  OT General Charges $OT Visit: 1 Visit OT Evaluation $OT Eval Low Complexity: 1 Low OT Treatments $Self Care/Home Management : 8-22 mins  Rosalio Loud, MS Acute Rehabilitation Department Office# [phone]   Selinda Flavin 07/23/2023, 3:43 PM

## 2023-07-24 DIAGNOSIS — A419 Sepsis, unspecified organism: Secondary | ICD-10-CM | POA: Diagnosis not present

## 2023-07-24 DIAGNOSIS — R6520 Severe sepsis without septic shock: Secondary | ICD-10-CM | POA: Diagnosis not present

## 2023-07-24 LAB — CBC
HCT: 34.1 % — ABNORMAL LOW (ref 36.0–46.0)
Hemoglobin: 11.4 g/dL — ABNORMAL LOW (ref 12.0–15.0)
MCH: 32.5 pg (ref 26.0–34.0)
MCHC: 33.4 g/dL (ref 30.0–36.0)
MCV: 97.2 fL (ref 80.0–100.0)
Platelets: 83 10*3/uL — ABNORMAL LOW (ref 150–400)
RBC: 3.51 MIL/uL — ABNORMAL LOW (ref 3.87–5.11)
RDW: 13.6 % (ref 11.5–15.5)
WBC: 4.6 10*3/uL (ref 4.0–10.5)
nRBC: 0 % (ref 0.0–0.2)

## 2023-07-24 LAB — GLUCOSE, CAPILLARY
Glucose-Capillary: 72 mg/dL (ref 70–99)
Glucose-Capillary: 77 mg/dL (ref 70–99)
Glucose-Capillary: 100 mg/dL — ABNORMAL HIGH (ref 70–99)

## 2023-07-24 LAB — BASIC METABOLIC PANEL
Anion gap: 9 (ref 5–15)
Anion gap: 9 (ref 5–15)
BUN: 20 mg/dL (ref 8–23)
BUN: 19 mg/dL (ref 8–23)
CO2: 16 mmol/L — ABNORMAL LOW (ref 22–32)
CO2: 18 mmol/L — ABNORMAL LOW (ref 22–32)
Calcium: 7.3 mg/dL — ABNORMAL LOW (ref 8.9–10.3)
Calcium: 8 mg/dL — ABNORMAL LOW (ref 8.9–10.3)
Chloride: 109 mmol/L (ref 98–111)
Chloride: 111 mmol/L (ref 98–111)
Creatinine, Ser: 0.78 mg/dL (ref 0.44–1.00)
Creatinine, Ser: 0.8 mg/dL (ref 0.44–1.00)
GFR, Estimated: >60 mL/min (ref >60)
GFR, Estimated: >60 mL/min (ref >60)
Glucose, Bld: 100 mg/dL — ABNORMAL HIGH (ref 70–99)
Glucose, Bld: 85 mg/dL (ref 70–99)
Potassium: 2.7 mmol/L — CL (ref 3.5–5.1)
Potassium: 3.6 mmol/L (ref 3.5–5.1)
Sodium: 134 mmol/L — ABNORMAL LOW (ref 135–145)
Sodium: 138 mmol/L (ref 135–145)

## 2023-07-24 MED ORDER — POTASSIUM CHLORIDE CRYS ER 20 MEQ PO TBCR
40.0000 meq | EXTENDED_RELEASE_TABLET | Freq: Once | ORAL | Status: AC
Start: 2023-07-24 — End: 2023-07-24
  Administered 2023-07-24: 40 meq via ORAL
  Filled 2023-07-24: qty 2

## 2023-07-24 MED ORDER — SALINE SPRAY 0.65 % NA SOLN
1.0000 | NASAL | Status: DC | PRN
  Administered 2023-07-24: 1 via NASAL
  Filled 2023-07-24: qty 44

## 2023-07-24 MED ORDER — CARMEX CLASSIC LIP BALM EX OINT
TOPICAL_OINTMENT | CUTANEOUS | Status: DC | PRN
  Filled 2023-07-24: qty 10

## 2023-07-24 MED ORDER — POTASSIUM CHLORIDE 10 MEQ/100ML IV SOLN
10.0000 meq | INTRAVENOUS | Status: AC
  Administered 2023-07-24 (×2): 10 meq via INTRAVENOUS
  Filled 2023-07-24 (×2): qty 100

## 2023-07-24 MED ORDER — CHOLESTYRAMINE LIGHT 4 G PO PACK
4.0000 g | PACK | Freq: Four times a day (QID) | ORAL | Status: DC
  Administered 2023-07-24 (×4): 4 g via ORAL
  Filled 2023-07-24 (×4): qty 1

## 2023-07-24 MED ORDER — POTASSIUM CHLORIDE CRYS ER 20 MEQ PO TBCR
30.0000 meq | EXTENDED_RELEASE_TABLET | ORAL | Status: AC
  Administered 2023-07-24 (×2): 30 meq via ORAL
  Filled 2023-07-24 (×2): qty 1

## 2023-07-24 NOTE — Plan of Care (Signed)
  Problem: Education: Goal: Ability to describe self-care measures that may prevent or decrease complications (Diabetes Survival Skills Education) will improve Outcome: Progressing   Problem: Fluid Volume: Goal: Ability to maintain a balanced intake and output will improve Outcome: Progressing   Problem: Nutritional: Goal: Maintenance of adequate nutrition will improve Outcome: Progressing   Problem: Education: Goal: Knowledge of General Education information will improve Description: Including pain rating scale, medication(s)/side effects and non-pharmacologic comfort measures Outcome: Progressing

## 2023-07-24 NOTE — Progress Notes (Signed)
Physical Therapy Treatment Patient Details Name: Leslie Rivera MRN: [id number] DOB: [birth year] Today's Date: 07/24/2023   History of Present Illness Pt is a 78 year old female presenting with N/V/D abdominal pain; found to have CAP and severe sepesis with gastroenteritis. Imaging with no new acute findings.  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 Amb ~200' with light RW support and supervision. Doubt pt will need f/u PT at d/c but will continue to follow in acute setting    If plan is discharge home, recommend the following: A little help with walking and/or transfers;A little help with bathing/dressing/bathroom;Help with stairs or ramp for entrance   Can travel by private vehicle        Equipment Recommendations  None recommended by PT    Recommendations for Other Services       Precautions / Restrictions Precautions Precautions: Fall Precaution Comments: fall prior to admission Restrictions Weight Bearing Restrictions Per Provider Order: No     Mobility  Bed Mobility Overal bed mobility: Modified Independent                  Transfers Overall transfer level: Modified independent Equipment used: None                    Ambulation/Gait Ambulation/Gait assistance: Supervision Gait Distance (Feet): 200 Feet Assistive device: Rolling walker (2 wheels), None Gait Pattern/deviations: Step-through pattern       General Gait Details: pt making good progress with gait, feels more stable with RW support although was able to mobilize in room shorter distances without device, no LOB   Stairs             Wheelchair Mobility     Tilt Bed    Modified Rankin (Stroke Patients Only)       Balance   Sitting-balance support: Feet supported, No upper extremity supported Sitting balance-Leahy Scale: Good      Standing balance support: During functional activity, No upper extremity supported Standing balance-Leahy Scale: Fair                              Cognition Arousal: Alert Behavior During Therapy: WFL for tasks assessed/performed Overall Cognitive Status: Within Functional Limits for tasks assessed                                          Exercises      General Comments        Pertinent Vitals/Pain Pain Assessment Pain Assessment: No/denies pain    Home Living                          Prior Function            PT Goals (current goals can now be found in the care plan section) Acute Rehab PT Goals Patient Stated Goal: to get stronger and go home PT Goal Formulation: With patient Time For Goal Achievement: 08/04/23 Potential to Achieve Goals: Good Progress towards PT goals: Progressing toward goals    Frequency    Min 1X/week      PT Plan      Co-evaluation  AM-PAC PT "6 Clicks" Mobility   Outcome Measure  Help needed turning from your back to your side while in a flat bed without using bedrails?: None Help needed moving from lying on your back to sitting on the side of a flat bed without using bedrails?: None Help needed moving to and from a bed to a chair (including a wheelchair)?: None Help needed standing up from a chair using your arms (e.g., wheelchair or bedside chair)?: None Help needed to walk in hospital room?: None Help needed climbing 3-5 steps with a railing? : A Little 6 Click Score: 23    End of Session   Activity Tolerance: Patient tolerated treatment well Patient left: in chair;with call bell/phone within reach Nurse Communication: Mobility status PT Visit Diagnosis: Muscle weakness (generalized) (M62.81);Other abnormalities of gait and mobility (R26.89);History of falling (Z91.81)     Time: 1315-1340 PT Time Calculation (min) (ACUTE ONLY): 25 min  Charges:    $Gait  Training: 23-37 mins PT General Charges $$ ACUTE PT VISIT: 1 Visit                     Srikar Chiang, PT  Acute Rehab Dept ([facility]) [phone]  07/24/2023    [facility] 07/24/2023, 3:04 PM

## 2023-07-24 NOTE — Progress Notes (Signed)
PROGRESS NOTE ABERDEEN HAFEN  UJW:[id number] DOB: [birth year] DOA: 07/20/2023 PCP: Georgianne Fick, MD  Brief Narrative/Hospital Course: 78 y.o. 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 In the ED tachycardic up to 120s, afebrile hypoxic up to 88% BP stable, labs showing hyponatremia hypokalemia AKI creatinine 1.6 mild transaminitis lactic acidosis, leukopenia thrombocytopenia.  Patient was admitted for severe sepsis.  CT abdomen pelvis without contrast> no acute finding, a small focus of groundglass density in the left lower lung base, 9 mm aortocaval lymph node previously 10. Stool studies negative for CD but positive for norovirus and being managed symptomatically AKI has resolved, tolerating diet CBC remains stable with platelets improving.   Subjective: overnight afebrile and BP stable Rectal tube in place stool output decreasing 900 cc Labs with hypokalemia 2.7-being replaced  Assessment and Plan: Principal Problem:   Severe sepsis (HCC) Active Problems:   AKI (acute kidney injury) (HCC)   Pneumonia due to infectious organism   Hypokalemia   Diarrhea   Hypomagnesemia   Hypophosphatemia   Severe sepsis POA Left lower lobe pneumonia-CAP Acute hypoxic aspiratory failure due to pneumonia and sepsis: Patient with leukopenia from recent chemotherapy and also with acute gastroenteritis symptoms, met severe sepsis criteria.  Overall doing well, continue total 5 days of antibiotics to complete the course. Blood culture no growth to date, leukopenia resolved Recent Labs  Lab 07/20/23 1625 07/20/23 1650 07/20/23 2030 07/21/23 0617 07/22/23 0726 07/22/23 0727 07/23/23 0456 07/24/23 0540  WBC 1.8*  --   --  2.4*   --  4.4 6.3 4.6  LATICACIDVEN  --  5.0* 2.6*  --  2.8*  --   --   --     AKI Metabolic acidosis Hypokalemia: In the setting of acute gastroenteritis and norovirus infection.  Potassium again low this morning replacing aggressively recheck later today continue oral bicarb Recent Labs    08/08/22 1013 10/05/22 0820 10/26/22 0809 11/16/22 0943 12/15/22 0838 07/20/23 1625 07/21/23 0617 07/22/23 0727 07/23/23 0456 07/24/23 0540  BUN 23 24* 17 16 21  58* 37* 23 23 20   CREATININE 0.84 0.78 0.76 0.72 0.79 1.69* 1.11* 0.99 1.01* 0.78  CO2 25 21* 25 22 19* 15* 15* 13* 16* 16*  K 3.7 3.9 4.9 4.1 3.8 3.1* 2.7* 4.3 3.3* 2.7*   Acute gastroenteritis due to norovirus infection: CT abdomen no acute finding. Less diarrhea now off rectal tube, added Questran qid. Cont contact and enteral precautions  Leukopenia Thrombocytopenia: In the setting of sepsis.  WBC normalized platelet on lower side but no bleeding.  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  She had chemotherapy but developed allergy to carboplatin last year and decided against further chemotherapy.Patient decided she preferred a better quality of life with the rest of her time so she was referred to home-based palliative care. Patient is DNR.  Palliative care input appreciated  Vitamin B12 deficiency  Peripheral neuropathy: B12 normal,.  Started on gabapentin continue  Anxiety: Mood is stable, continue Lexapro and as needed Valium   Insomnia: Continue Ambien PRN    Generalized debility Deconditioning: PT OT consulted,Continue protein supplementation  DVT prophylaxis: enoxaparin (LOVENOX) injection 40 mg Start: 07/21/23 2200 Code Status:   Code Status: Limited: Do not attempt resuscitation (DNR) -DNR-LIMITED -Do Not Intubate/DNI  Family Communication: plan of care discussed with patient at bedside. Patient status is: Remains hospitalized because of severity of illness Level of care: Progressive   Dispo: The patient is from: Home            Anticipated disposition: Discharge  24 hrs if potassium and diarrhea better Objective: Vitals last 24 hrs: Vitals:   07/23/23 1152 07/23/23 1941 07/24/23 0412 07/24/23 1132  BP: 109/68 120/79 109/62 93/62  Pulse: (!) 101 93 88 90  Resp: 16 16 15 17   Temp: 97.7 F (36.5 C) 97.7 F (36.5 C) 98.4 F (36.9 C) 98.3 F (36.8 C)  TempSrc: Oral Oral Oral Oral  SpO2: 99% 99% 97% 99%  Weight:      Height:       Weight change:   Physical Examination: General exam: alert awake, oriented at baseline, older than stated age HEENT:Oral mucosa moist, Ear/Nose WNL grossly Respiratory system: Bilaterally clear BS,no use of accessory muscle Cardiovascular system: S1 & S2 +, No JVD. Gastrointestinal system: Abdomen soft,NT,ND, BS+ Nervous System: Alert, awake, moving all extremities,and following commands. Extremities: LE edema neg,distal peripheral pulses palpable and warm.  Skin: No rashes,no icterus. MSK: Normal muscle bulk,tone, power    Medications reviewed:  Scheduled Meds:  cholestyramine light  4 g Oral QID   doxycycline  100 mg Oral Q12H   enoxaparin (LOVENOX) injection  40 mg Subcutaneous Q24H   escitalopram  15 mg Oral Daily   feeding supplement  237 mL Oral BID   gabapentin  100 mg Oral TID   insulin aspart  0-15 Units Subcutaneous Q4H   levothyroxine  75 mcg Oral Q0600   potassium chloride  30 mEq Oral Q3H   sodium bicarbonate  1,300 mg Oral BID   Continuous Infusions:  cefTRIAXone (ROCEPHIN)   IV 1 g (07/23/23 1635)   potassium chloride 10 mEq (07/24/23 1004)    Diet Order             Diet regular Room service appropriate? Yes; Fluid consistency: Thin  Diet effective now                  Intake/Output Summary (Last 24 hours) at 07/24/2023 1136 Last data filed at 07/24/2023 0400 Gross per 24 hour  Intake 240 ml  Output 250 ml  Net -10 ml   Net IO Since Admission: 2,971.51 mL [07/24/23 1136]  Wt Readings from Last 3 Encounters:  07/20/23 74.8 kg  12/19/22 75.1 kg  11/16/22 72.8 kg     Unresulted Labs (From admission, onward)     Start     Ordered   07/24/23 1400  Basic metabolic panel  Once,   R        07/24/23 0923   07/24/23 0500  Basic metabolic panel  Daily,   R      07/23/23 1120   07/24/23 0500  CBC  Daily,   R      07/23/23 1120          Data Reviewed: I have personally reviewed following labs and imaging studies CBC: Recent Labs  Lab 07/20/23 1625 07/21/23  9604 07/22/23 0727 07/23/23 0456 07/24/23 0540  WBC 1.8* 2.4* 4.4 6.3 4.6  NEUTROABS 1.3*  --   --   --   --   HGB 14.4 12.8 15.9* 13.9 11.4*  HCT 44.1 38.4 48.8* 41.6 34.1*  MCV 98.4 96.7 98.0 97.0 97.2  PLT 141* 102* 70* 88* 83*   Basic Metabolic Panel:  Recent Labs  Lab 07/20/23 1625 07/21/23 0617 07/22/23 0727 07/23/23 0456 07/24/23 0540  NA 132* 135 136 136 134*  K 3.1* 2.7* 4.3 3.3* 2.7*  CL 102 110 114* 111 109  CO2 15* 15* 13* 16* 16*  GLUCOSE 137* 122* 80 99 100*  BUN 58* 37* 23 23 20   CREATININE 1.69* 1.11* 0.99 1.01* 0.78  CALCIUM 7.5* 7.2* 7.8* 7.7* 7.3*  MG  --  1.6* 2.2  --   --   PHOS  --  2.2* 2.3*  --   --    GFR: Estimated Creatinine Clearance: 60.5 mL/min (by C-G formula based on SCr of 0.78 mg/dL). Liver Function Tests:  Recent Labs  Lab 07/20/23 1625 07/21/23 0617 07/23/23 0456  AST 74* 98* 88*  ALT 46* 46* 57*  ALKPHOS 41 37* 59  BILITOT 0.7 0.7 0.4  PROT 6.5 5.4* 6.1*  ALBUMIN 3.0* 2.5* 2.7*   No results for input(s): "PROBNP" in the  last 168 hours.  No results for input(s): "HGBA1C" in the last 72 hours. Recent Labs  Lab 07/23/23 1615 07/23/23 1938 07/23/23 2353 07/24/23 0408 07/24/23 0717  GLUCAP 107* 83 84 72 77   No results for input(s): "CHOL", "HDL", "LDLCALC", "TRIG", "CHOLHDL", "LDLDIRECT" in the last 72 hours. No results for input(s): "TSH", "T4TOTAL", "FREET4", "T3FREE", "THYROIDAB" in the last 72 hours.  Sepsis Labs: Recent Labs  Lab 07/20/23 1650 07/20/23 2030 07/22/23 0726  LATICACIDVEN 5.0* 2.6* 2.8*   Recent Results (from the past 240 hours)  Blood Culture (routine x 2)     Status: None (Preliminary result)   Collection Time: 07/20/23  4:37 PM   Specimen: Site Not Specified; Blood  Result Value Ref Range Status   Specimen Description   Final    SITE NOT SPECIFIED Performed at [facility], [address], [place], [place] [address]    Special Requests   Final    BOTTLES DRAWN AEROBIC AND ANAEROBIC Blood Culture adequate volume Performed at [facility], [address] [place], [place] [address]    Culture   Final    NO GROWTH 4 DAYS Performed at [facility], [address] [place], [place] [address]    Report Status PENDING  Incomplete  Gastrointestinal Panel by PCR , Stool     Status: Abnormal   Collection Time: 07/20/23  8:49 PM   Specimen: Stool  Result Value Ref Range Status   Campylobacter species NOT DETECTED NOT DETECTED Final   Plesimonas shigelloides NOT DETECTED NOT DETECTED Final   Salmonella species NOT DETECTED NOT DETECTED Final   Yersinia enterocolitica NOT DETECTED NOT DETECTED Final   Vibrio species NOT DETECTED NOT DETECTED Final   Vibrio cholerae NOT DETECTED NOT DETECTED Final   Enteroaggregative E coli (EAEC) NOT DETECTED NOT DETECTED Final   Enteropathogenic E coli (EPEC) NOT DETECTED NOT DETECTED Final   Enterotoxigenic E coli (ETEC) NOT DETECTED NOT DETECTED Final   Shiga like toxin producing E coli (STEC)  NOT DETECTED NOT DETECTED Final   Shigella/Enteroinvasive E coli (EIEC) NOT DETECTED NOT DETECTED Final   Cryptosporidium NOT  DETECTED NOT DETECTED Final   Cyclospora cayetanensis NOT DETECTED NOT DETECTED Final   Entamoeba histolytica NOT DETECTED NOT DETECTED Final   Giardia lamblia NOT DETECTED NOT DETECTED Final   Adenovirus F40/41 NOT DETECTED NOT DETECTED Final   Astrovirus NOT DETECTED NOT DETECTED Final   Norovirus GI/GII DETECTED (A) NOT DETECTED Final    Comment: RESULT CALLED TO, READ BACK BY AND VERIFIED WITH: DIAMONE MAYS 07/22/2023 AT 1557 SRR    Rotavirus A NOT DETECTED NOT DETECTED Final   Sapovirus (I, II, IV, and V) NOT DETECTED NOT DETECTED Final    Comment: Performed at [facility], [address] [place], [place] [address]  Blood Culture (routine x 2)     Status: None (Preliminary result)   Collection Time: 07/21/23  6:17 AM   Specimen: BLOOD RIGHT ARM  Result Value Ref Range Status   Specimen Description   Final    BLOOD RIGHT ARM Performed at [facility], [address], [place], [place] [address]    Special Requests   Final    BOTTLES DRAWN AEROBIC AND ANAEROBIC Blood Culture results may not be optimal due to an inadequate volume of blood received in culture bottles Performed at [facility], [address], [place], [place] [address]    Culture   Final    NO GROWTH 3 DAYS Performed at [facility], [address] [place], [place] [address]    Report Status PENDING  Incomplete  C Difficile Quick Screen w PCR reflex     Status: None   Collection Time: 07/22/23 10:06 AM   Specimen: STOOL  Result Value Ref Range Status   C Diff antigen NEGATIVE NEGATIVE Final   C Diff toxin NEGATIVE NEGATIVE Final   C Diff interpretation No C. difficile detected.  Final    Comment: Performed at [facility], [address], [place], [place] [address]    Antimicrobials/Microbiology: Anti-infectives (From  admission, onward)    Start     Dose/Rate Route Frequency Ordered Stop   07/21/23 2200  doxycycline (VIBRA-TABS) tablet 100 mg        100 mg Oral Every 12 hours 07/21/23 1035 07/24/23 2359   07/21/23 1700  cefTRIAXone (ROCEPHIN) 1 g in sodium chloride 0.9 % 100 mL IVPB        1 g 200 mL/hr over 30 Minutes Intravenous Every 24 hours 07/20/23 2347 07/25/23 1659   07/20/23 2000  doxycycline (VIBRAMYCIN) 100 mg in sodium chloride 0.9 % 250 mL IVPB  Status:  Discontinued        100 mg 125 mL/hr over 120 Minutes Intravenous Every 12 hours 07/20/23 1929 07/21/23 1035   07/20/23 1700  ceFEPIme (MAXIPIME) 2 g in sodium chloride 0.9 % 100 mL IVPB        2 g 200 mL/hr over 30 Minutes Intravenous  Once 07/20/23 1654 07/20/23 2000   07/20/23 1700  metroNIDAZOLE (FLAGYL) IVPB 500 mg        500 mg 100 mL/hr over 60 Minutes Intravenous  Once 07/20/23 1654 07/20/23 1830         Component Value Date/Time   SDES  07/21/2023 0617    BLOOD RIGHT ARM Performed at [facility], [address], [place], [place] [address]    SPECREQUEST  07/21/2023 0617    BOTTLES DRAWN AEROBIC AND ANAEROBIC Blood Culture results may not be optimal due to an inadequate volume of blood received in culture  bottles Performed at [facility], [address], [place], [place] [address]    CULT  07/21/2023 0617    NO GROWTH 3 DAYS Performed at [facility], [address] [place], [place] [address]    REPTSTATUS PENDING 07/21/2023 [phone]     Radiology Studies: No results found.    LOS: 4 days   Total time spent in review of labs and imaging, patient evaluation, formulation of plan, documentation and communication with family: 35 minutes  Lanae Boast, MD  [facility]  07/24/2023, 11:36 AM

## 2023-07-25 DIAGNOSIS — R6520 Severe sepsis without septic shock: Secondary | ICD-10-CM | POA: Diagnosis not present

## 2023-07-25 DIAGNOSIS — A419 Sepsis, unspecified organism: Secondary | ICD-10-CM | POA: Diagnosis not present

## 2023-07-25 LAB — CULTURE, BLOOD (ROUTINE X 2)
Culture: NO GROWTH
Special Requests: ADEQUATE

## 2023-07-25 LAB — CBC
HCT: 35.8 % — ABNORMAL LOW (ref 36.0–46.0)
Hemoglobin: 11.6 g/dL — ABNORMAL LOW (ref 12.0–15.0)
MCH: 32 pg (ref 26.0–34.0)
MCHC: 32.4 g/dL (ref 30.0–36.0)
MCV: 98.6 fL (ref 80.0–100.0)
Platelets: 105 10*3/uL — ABNORMAL LOW (ref 150–400)
RBC: 3.63 MIL/uL — ABNORMAL LOW (ref 3.87–5.11)
RDW: 13.8 % (ref 11.5–15.5)
WBC: 4.4 10*3/uL (ref 4.0–10.5)
nRBC: 0 % (ref 0.0–0.2)

## 2023-07-25 LAB — BASIC METABOLIC PANEL
Anion gap: 10 (ref 5–15)
BUN: 14 mg/dL (ref 8–23)
CO2: 17 mmol/L — ABNORMAL LOW (ref 22–32)
Calcium: 8.3 mg/dL — ABNORMAL LOW (ref 8.9–10.3)
Chloride: 114 mmol/L — ABNORMAL HIGH (ref 98–111)
Creatinine, Ser: 0.4 mg/dL — ABNORMAL LOW (ref 0.44–1.00)
GFR, Estimated: >60 mL/min (ref >60)
Glucose, Bld: 89 mg/dL (ref 70–99)
Potassium: 4.2 mmol/L (ref 3.5–5.1)
Sodium: 141 mmol/L (ref 135–145)

## 2023-07-25 NOTE — Discharge Summary (Signed)
Physician Discharge Summary  Leslie Rivera ZOX:[id number] DOB: 1946-04-27 DOA: 07/20/2023  PCP: Georgianne Fick, MD  Admit date: 07/20/2023 Discharge date: 07/25/2023 Recommendations for Outpatient Follow-up:  Follow up with PCP in 1 weeks-call for appointment Please obtain BMP/CBC in one week  Discharge Dispo: home w/ [facility] Discharge Condition: Stable Code Status:   Code Status: Limited: Do not attempt resuscitation (DNR) -DNR-LIMITED -Do Not Intubate/DNI  Diet recommendation:  Diet Order             Diet regular Room service appropriate? Yes; Fluid consistency: Thin  Diet effective now                    Brief/Interim Summary: 78 y.o. 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 In the ED tachycardic up to 120s, afebrile hypoxic up to 88% BP stable, labs showing hyponatremia hypokalemia AKI creatinine 1.6 mild transaminitis lactic acidosis, leukopenia thrombocytopenia.  Patient was admitted for severe sepsis.  CT abdomen pelvis without contrast> no acute finding, a small focus of groundglass density in the left lower lung base, 9 mm aortocaval lymph node previously 10. Stool studies negative for CD but positive for norovirus and being managed symptomatically AKI has resolved, tolerating diet CBC remains stable with platelets improving.    Discharge Diagnoses:  Principal Problem:   Severe sepsis (HCC) Active Problems:   AKI (acute kidney injury) (HCC)   Pneumonia due to infectious organism   Hypokalemia   Diarrhea   Hypomagnesemia   Hypophosphatemia  Severe sepsis POA Left lower lobe pneumonia-CAP Acute hypoxic aspiratory failure due to pneumonia and sepsis: Patient with leukopenia from recent chemotherapy  and also with acute gastroenteritis symptoms, met severe sepsis criteria.  Overall doing well, patient completed 5 days of antibiotics.  Blood culture no growth, doing well on room air.     AKI Metabolic acidosis Hypokalemia: In the setting of acute gastroenteritis and norovirus infection, Resolved, tolerating DIET.  Acute gastroenteritis due to norovirus infection: CT abdomen no acute finding.  No more diarrhea this morning.  Semisolid stool, encourage adequate oral hydration at home   Leukopenia Thrombocytopenia: In the setting of sepsis.  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  She had chemotherapy but developed allergy to carboplatin last year and decided against further chemotherapy.Patient decided she preferred a better quality of life with the rest of her time so she was referred to home-based palliative care. Patient is DNR.  Palliative care input appreciated   Vitamin B12 deficiency Peripheral neuropathy: B12 norma   Anxiety: Mood is stable, continue Lexapro and as needed Valium   Insomnia: Continue Ambien PRN   Generalized debility Deconditioning: PT OT consulted,Continue protein supplementation  Consults: PMT Subjective: Alert awake oriented no more diarrhea tolerating diet well.  Eager to go home today  Discharge Exam: Vitals:   07/24/23 1926 07/25/23 0514  BP: 129/72 122/77  Pulse: 88 87  Resp: 16 12  Temp: 99.5 F (37.5 C) 99.5 F (37.5 C)  SpO2: 99% 97%   General: Pt is alert, awake, not  in acute distress Cardiovascular: RRR, S1/S2 +, no rubs, no gallops Respiratory: CTA bilaterally, no wheezing, no rhonchi Abdominal:  Soft, NT, ND, bowel sounds + Extremities: no edema, no cyanosis  Discharge Instructions  Discharge Instructions     Discharge instructions   Complete by: As directed    Please call call MD or return to ER for similar or worsening recurring problem that brought you to hospital or if any fever,nausea/vomiting,abdominal pain, uncontrolled pain, chest pain,  shortness of breath or any other alarming symptoms.  Please follow-up your doctor as instructed in a week time and call the office for appointment.  Please avoid alcohol, smoking, or any other illicit substance and maintain healthy habits including taking your regular medications as prescribed.  You were cared for by a hospitalist during your hospital stay. If you have any questions about your discharge medications or the care you received while you were in the hospital after you are discharged, you can call the unit and ask to speak with the hospitalist on call if the hospitalist that took care of you is not available.  Once you are discharged, your primary care physician will handle any further medical issues. Please note that NO REFILLS for any discharge medications will be authorized once you are discharged, as it is imperative that you return to your primary care physician (or establish a relationship with a primary care physician if you do not have one) for your aftercare needs so that they can reassess your need for medications and monitor your lab values   Increase activity slowly   Complete by: As directed       Allergies as of 07/25/2023       Reactions   Barium-containing Compounds Other (See Comments)   Legs and arm cramping & caused fall   Carboplatin Other (See Comments)   Hypersensitivity- see note 11/16/22   Hydrocodone Nausea And Vomiting, Other (See Comments)   Can tolerate Oxycodone   Erythromycin Other (See Comments)   GI upset   Zofran [ondansetron] Other (See Comments)   Headache-Never wants  again         Medication List     TAKE these medications    clobetasol ointment 0.05 % Commonly known as: TEMOVATE Apply 1 Application topically See admin instructions. Apply a thin layer to the vulva every day as directed   diazepam 5 MG tablet Commonly known as: VALIUM Take 1 tablet (5 mg total) by mouth daily as needed for anxiety.   DRAMAMINE GINGER CHEWS PO Take by mouth.   escitalopram 10 MG tablet Commonly known as: LEXAPRO TAKE 1 & 1/2 (ONE & ONE-HALF) TABLETS BY MOUTH ONCE DAILY What changed: See the new instructions.   estradiol 0.025 mg/24hr patch Commonly known as: Climara Place 1 patch (0.025 mg total) onto the skin once a week.   ibuprofen 200 MG tablet Commonly known as: ADVIL Take 200 mg by mouth every 6 (six) hours as needed for mild pain (pain score 1-3) or headache (IN CONJUNCTION WITH ONE 325 MG. TYLENOL).   levothyroxine 75 MCG tablet Commonly known as: SYNTHROID Take 75 mcg by mouth daily before breakfast.   oxyCODONE-acetaminophen 5-325 MG tablet Commonly known as: PERCOCET/ROXICET Take 1 tablet by mouth every 6 (six) hours as needed for severe pain. What changed: how much to take   prochlorperazine 10 MG tablet Commonly known as: COMPAZINE Take 1 tablet (10 mg total) by mouth every 6 (six) hours as needed (Nausea or vomiting).   Systane 0.4-0.3 %  Soln Generic drug: Polyethyl Glycol-Propyl Glycol Place 1-2 drops into both eyes 3 (three) times daily as needed (dry/irritated eyes.).   Tylenol 325 MG tablet Generic drug: acetaminophen Take 325 mg by mouth every 6 (six) hours as needed for mild pain (pain score 1-3) or headache (IN CONJUNCTION WITH ONE 200 MG. IBUPROFEN).   valACYclovir 1000 MG tablet Commonly known as: VALTREX Take 1 tablet (1,000 mg total) by mouth daily as needed (Fever Blister).   Vitamin B-12 5000 MCG Subl Place 5,000 mcg under the tongue daily.   zolpidem 10 MG tablet Commonly known as: AMBIEN TAKE 1 TABLET BY MOUTH AT BEDTIME  AS NEEDED FOR SLEEP What changed:  how much to take when to take this additional instructions        Follow-up Information     [facility] Follow up.   Specialty: Home Health Services Why: [facility] physical therapy Contact information: [address] [place] [address] [phone]         Georgianne Fick, MD Follow up in 1 week(s).   Specialty: Internal Medicine Contact information: [address] [place] [address] [phone]                Allergies  Allergen Reactions   Barium-Containing Compounds Other (See Comments)    Legs and arm cramping & caused fall   Carboplatin Other (See Comments)    Hypersensitivity- see note 11/16/22   Hydrocodone Nausea And Vomiting and Other (See Comments)    Can tolerate Oxycodone   Erythromycin Other (See Comments)    GI upset   Zofran [Ondansetron] Other (See Comments)    Headache-Never wants  again    The results of significant diagnostics from this hospitalization (including imaging, microbiology, ancillary and laboratory) are listed below for reference.    Microbiology: Recent Results (from the past 240 hours)  Blood Culture (routine x 2)     Status: None   Collection Time: 07/20/23  4:37 PM   Specimen: Site Not Specified; Blood  Result Value Ref Range Status   Specimen Description   Final    SITE NOT SPECIFIED Performed at [facility], [address] [place], [place] [address]    Special Requests   Final    BOTTLES DRAWN AEROBIC AND ANAEROBIC Blood Culture adequate volume Performed at [facility], [address] [place], [place] [address]    Culture   Final    NO GROWTH 5 DAYS Performed at [facility], [address], [place], [place] [address]    Report Status 07/25/2023 FINAL  Final  Gastrointestinal Panel by PCR , Stool     Status: Abnormal   Collection Time: 07/20/23  8:49 PM   Specimen: Stool  Result  Value Ref Range Status   Campylobacter species NOT DETECTED NOT DETECTED Final   Plesimonas shigelloides NOT DETECTED NOT DETECTED Final   Salmonella species NOT DETECTED NOT DETECTED Final   Yersinia enterocolitica NOT DETECTED NOT DETECTED Final   Vibrio species NOT DETECTED NOT DETECTED Final   Vibrio cholerae NOT DETECTED NOT DETECTED Final   Enteroaggregative E coli (EAEC) NOT DETECTED NOT DETECTED Final   Enteropathogenic E coli (EPEC) NOT DETECTED NOT DETECTED Final   Enterotoxigenic E coli (ETEC) NOT DETECTED NOT DETECTED Final   Shiga like toxin producing E coli (STEC) NOT DETECTED NOT DETECTED Final   Shigella/Enteroinvasive E coli (EIEC) NOT DETECTED NOT DETECTED Final   Cryptosporidium NOT DETECTED NOT  DETECTED Final   Cyclospora cayetanensis NOT DETECTED NOT DETECTED Final   Entamoeba histolytica NOT DETECTED NOT DETECTED Final   Giardia lamblia NOT DETECTED NOT DETECTED Final   Adenovirus F40/41 NOT DETECTED NOT DETECTED Final   Astrovirus NOT DETECTED NOT DETECTED Final   Norovirus GI/GII DETECTED (A) NOT DETECTED Final    Comment: RESULT CALLED TO, READ BACK BY AND VERIFIED WITH: DIAMONE MAYS 07/22/2023 AT 1557 SRR    Rotavirus A NOT DETECTED NOT DETECTED Final   Sapovirus (I, II, IV, and V) NOT DETECTED NOT DETECTED Final    Comment: Performed at [facility], [address] [place], [place] [address]  Blood Culture (routine x 2)     Status: None (Preliminary result)   Collection Time: 07/21/23  6:17 AM   Specimen: BLOOD RIGHT ARM  Result Value Ref Range Status   Specimen Description   Final    BLOOD RIGHT ARM Performed at [facility], [address] [place], [place] [address]    Special Requests   Final    BOTTLES DRAWN AEROBIC AND ANAEROBIC Blood Culture results may not be optimal due to an inadequate volume of blood received in culture bottles Performed at [facility], [address], [place], [place] [address]    Culture    Final    NO GROWTH 4 DAYS Performed at [facility], [address] [place], [place] [address]    Report Status PENDING  Incomplete  C Difficile Quick Screen w PCR reflex     Status: None   Collection Time: 07/22/23 10:06 AM   Specimen: STOOL  Result Value Ref Range Status   C Diff antigen NEGATIVE NEGATIVE Final   C Diff toxin NEGATIVE NEGATIVE Final   C Diff interpretation No C. difficile detected.  Final    Comment: Performed at [facility], [address], [place], [place] [address]    Procedures/Studies: CT ABDOMEN PELVIS WO CONTRAST Result Date: 07/20/2023 CLINICAL DATA:  History of ovarian cancer with emesis and hypotension. * Tracking Code: BO * EXAM: CT ABDOMEN AND PELVIS WITHOUT CONTRAST TECHNIQUE: Multidetector CT imaging of the abdomen and pelvis was performed following the standard protocol without IV contrast. RADIATION DOSE REDUCTION: This exam was performed according to the departmental dose-optimization program which includes automated exposure control, adjustment of the mA and/or kV according to patient size and/or use of iterative reconstruction technique. COMPARISON:  CT abdomen and pelvis dated 12/15/2022 FINDINGS: Lower chest: Small focus of ground-glass density at the left lower lung base (6:14). No pleural effusion or pneumothorax demonstrated. Partially imaged heart size is normal. Hepatobiliary: No focal hepatic lesions. No intra or extrahepatic biliary ductal dilation. Normal gallbladder. Pancreas: No focal lesions or main ductal dilation. Spleen: Normal in size without focal abnormality. Adrenals/Urinary Tract: No adrenal nodules. No suspicious renal mass on this noncontrast enhanced examination , calculi or hydronephrosis. No focal bladder wall thickening. Stomach/Bowel: Normal appearance of the stomach. Postsurgical changes from right hemicolectomy and ileocolonic anastomosis. No evidence of bowel wall thickening, distention, or inflammatory  changes. Colonic diverticulosis without acute diverticulitis. Vascular/Lymphatic: Aortic atherosclerosis. Similar 9 mm aortocaval lymph node (2:41), previously 10 mm. Reproductive: No adnexal masses. Other: No free fluid, fluid collection, or free air. Musculoskeletal: No acute or abnormal lytic or blastic osseous lesions. Multilevel degenerative changes of the partially imaged thoracic and lumbar spine. IMPRESSION: 1. No acute abdominopelvic findings. 2. Small focus of ground-glass density at the left lower lung base,  which may represent atelectasis or be infectious or inflammatory. 3. Similar 9 mm aortocaval lymph node, previously 10 mm. 4. Aortic Atherosclerosis (ICD10-I70.0). Electronically Signed   By: Agustin Cree M.D.   On: 07/20/2023 18:12   DG Chest Port 1 View Result Date: 07/20/2023 CLINICAL DATA:  Questionable sepsis - evaluate for abnormality Vomiting. EXAM: PORTABLE CHEST 1 VIEW COMPARISON:  CT 12/15/2022 FINDINGS: Low lung volumes. The heart is upper normal in size. Aortic atherosclerosis and tortuosity. Question of ill-defined opacity at the left lung base. Bronchovascular crowding. No pneumothorax or large pleural effusion. IMPRESSION: Low lung volumes. Question of ill-defined opacity at the left lung base. Electronically Signed   By: Narda Rutherford M.D.   On: 07/20/2023 17:14    Labs: BNP (last 3 results) No results for input(s): "BNP" in the last 8760 hours. Basic Metabolic Panel: Recent Labs  Lab 07/21/23 0617 07/22/23 0727 07/23/23 0456 07/24/23 0540 07/24/23 1348 07/25/23 0547  NA 135 136 136 134* 138 141  K 2.7* 4.3 3.3* 2.7* 3.6 4.2  CL 110 114* 111 109 111 114*  CO2 15* 13* 16* 16* 18* 17*  GLUCOSE 122* 80 99 100* 85 89  BUN 37* 23 23 20 19 14   CREATININE 1.11* 0.99 1.01* 0.78 0.80 0.40*  CALCIUM 7.2* 7.8* 7.7* 7.3* 8.0* 8.3*  MG 1.6* 2.2  --   --   --   --   PHOS 2.2* 2.3*  --   --   --   --    Liver Function Tests: Recent Labs  Lab 07/20/23 1625  07/21/23 0617 07/23/23 0456  AST 74* 98* 88*  ALT 46* 46* 57*  ALKPHOS 41 37* 59  BILITOT 0.7 0.7 0.4  PROT 6.5 5.4* 6.1*  ALBUMIN 3.0* 2.5* 2.7*   No results for input(s): "LIPASE", "AMYLASE" in the last 168 hours. No results for input(s): "AMMONIA" in the last 168 hours. CBC: Recent Labs  Lab 07/20/23 1625 07/21/23 0617 07/22/23 0727 07/23/23 0456 07/24/23 0540 07/25/23 0547  WBC 1.8* 2.4* 4.4 6.3 4.6 4.4  NEUTROABS 1.3*  --   --   --   --   --   HGB 14.4 12.8 15.9* 13.9 11.4* 11.6*  HCT 44.1 38.4 48.8* 41.6 34.1* 35.8*  MCV 98.4 96.7 98.0 97.0 97.2 98.6  PLT 141* 102* 70* 88* 83* 105*   Cardiac Enzymes: No results for input(s): "CKTOTAL", "CKMB", "CKMBINDEX", "TROPONINI" in the last 168 hours. BNP: Invalid input(s): "POCBNP" CBG: Recent Labs  Lab 07/23/23 1938 07/23/23 2353 07/24/23 0408 07/24/23 0717 07/24/23 2002  GLUCAP 83 84 72 77 100*   D-Dimer No results for input(s): "DDIMER" in the last 72 hours. Hgb A1c No results for input(s): "HGBA1C" in the last 72 hours. Lipid Profile No results for input(s): "CHOL", "HDL", "LDLCALC", "TRIG", "CHOLHDL", "LDLDIRECT" in the last 72 hours. Thyroid function studies No results for input(s): "TSH", "T4TOTAL", "T3FREE", "THYROIDAB" in the last 72 hours.  Invalid input(s): "FREET3" Anemia work up No results for input(s): "VITAMINB12", "FOLATE", "FERRITIN", "TIBC", "IRON", "RETICCTPCT" in the last 72 hours. Urinalysis    Component Value Date/Time   COLORURINE YELLOW 07/22/2023 0758   APPEARANCEUR CLOUDY (A) 07/22/2023 0758   LABSPEC 1.013 07/22/2023 0758   PHURINE 5.0 07/22/2023 0758   GLUCOSEU NEGATIVE 07/22/2023 0758   HGBUR MODERATE (A) 07/22/2023 0758   BILIRUBINUR NEGATIVE 07/22/2023 0758   KETONESUR NEGATIVE 07/22/2023 0758   PROTEINUR NEGATIVE 07/22/2023 0758   NITRITE NEGATIVE 07/22/2023 0758   LEUKOCYTESUR TRACE (A) 07/22/2023  0758   Sepsis Labs Recent Labs  Lab 07/22/23 0727 07/23/23 0456  07/24/23 0540 07/25/23 0547  WBC 4.4 6.3 4.6 4.4   Microbiology Recent Results (from the past 240 hours)  Blood Culture (routine x 2)     Status: None   Collection Time: 07/20/23  4:37 PM   Specimen: Site Not Specified; Blood  Result Value Ref Range Status   Specimen Description   Final    SITE NOT SPECIFIED Performed at [facility], [address] [place], [place] [address]    Special Requests   Final    BOTTLES DRAWN AEROBIC AND ANAEROBIC Blood Culture adequate volume Performed at [facility], [address] [place], [place] [address]    Culture   Final    NO GROWTH 5 DAYS Performed at [facility], [address] [place], [place] [address]    Report Status 07/25/2023 FINAL  Final  Gastrointestinal Panel by PCR , Stool     Status: Abnormal   Collection Time: 07/20/23  8:49 PM   Specimen: Stool  Result Value Ref Range Status   Campylobacter species NOT DETECTED NOT DETECTED Final   Plesimonas shigelloides NOT DETECTED NOT DETECTED Final   Salmonella species NOT DETECTED NOT DETECTED Final   Yersinia enterocolitica NOT DETECTED NOT DETECTED Final   Vibrio species NOT DETECTED NOT DETECTED Final   Vibrio cholerae NOT DETECTED NOT DETECTED Final   Enteroaggregative E coli (EAEC) NOT DETECTED NOT DETECTED Final   Enteropathogenic E coli (EPEC) NOT DETECTED NOT DETECTED Final   Enterotoxigenic E coli (ETEC) NOT DETECTED NOT DETECTED Final   Shiga like toxin producing E coli (STEC) NOT DETECTED NOT DETECTED Final   Shigella/Enteroinvasive E coli (EIEC) NOT DETECTED NOT DETECTED Final   Cryptosporidium NOT DETECTED NOT DETECTED Final   Cyclospora cayetanensis NOT DETECTED NOT DETECTED Final   Entamoeba histolytica NOT DETECTED NOT DETECTED Final   Giardia lamblia NOT DETECTED NOT DETECTED Final   Adenovirus F40/41 NOT DETECTED NOT DETECTED Final   Astrovirus NOT DETECTED NOT DETECTED Final   Norovirus GI/GII DETECTED (A) NOT  DETECTED Final    Comment: RESULT CALLED TO, READ BACK BY AND VERIFIED WITH: DIAMONE MAYS 07/22/2023 AT 1557 SRR    Rotavirus A NOT DETECTED NOT DETECTED Final   Sapovirus (I, II, IV, and V) NOT DETECTED NOT DETECTED Final    Comment: Performed at [facility], [address] [place], [place] [address]  Blood Culture (routine x 2)     Status: None (Preliminary result)   Collection Time: 07/21/23  6:17 AM   Specimen: BLOOD RIGHT ARM  Result Value Ref Range Status   Specimen Description   Final    BLOOD RIGHT ARM Performed at [facility], [address], [place], [place] [address]    Special Requests   Final    BOTTLES DRAWN AEROBIC AND ANAEROBIC Blood Culture results may not be optimal due to an inadequate volume of blood received in culture bottles Performed at [facility], [address] [place], [place] [address]    Culture   Final    NO GROWTH 4 DAYS Performed at [facility], [address], [place], [place] [address]    Report Status PENDING  Incomplete  C Difficile Quick Screen w PCR reflex     Status: None   Collection Time: 07/22/23 10:06 AM   Specimen: STOOL  Result Value Ref Range Status  C Diff antigen NEGATIVE NEGATIVE Final   C Diff toxin NEGATIVE NEGATIVE Final   C Diff interpretation No C. difficile detected.  Final    Comment: Performed at [facility], [address], [place], [place] [address]     Time coordinating discharge: 25 minutes  SIGNED: Lanae Boast, MD  [facility] 07/25/2023, 10:00 AM  If 7PM-7AM, please contact night-coverage [email or web address]

## 2023-07-25 NOTE — TOC Transition Note (Signed)
Transition of Care [facility]) - Discharge Note   Patient Details  Name: Leslie Rivera MRN: [id number] Date of Birth: 03-02-1946  Transition of Care [facility]) CM/SW Contact:  Lanier Clam, RN Phone Number: 07/25/2023, 9:58 AM   Clinical Narrative:  d/c home w/[facility] rep Cory aware. No further CM needs.     Final next level of care: Home w Home Health Services Barriers to Discharge: No Barriers Identified   Patient Goals and CMS Choice            Discharge Placement                       Discharge Plan and Services Additional resources added to the After Visit Summary for                            [facility] Arranged: PT HH Agency: [facility] Date [facility] Agency Contacted: 07/25/23 Time HH Agency Contacted: [phone] Representative spoke with at [facility] Agency: Kandee Keen  Social Drivers of Health (SDOH) Interventions SDOH Screenings   Food Insecurity: No Food Insecurity (07/21/2023)  Housing: Low Risk  (07/21/2023)  Transportation Needs: No Transportation Needs (07/21/2023)  Utilities: Not At Risk (07/21/2023)  Social Connections: Unknown (07/21/2023)  Tobacco Use: Medium Risk (07/20/2023)     Readmission Risk Interventions     No data to display

## 2023-07-26 LAB — CULTURE, BLOOD (ROUTINE X 2)

## 2023-07-30 ENCOUNTER — Other Ambulatory Visit: Payer: Self-pay | Admitting: Hematology and Oncology

## 2023-07-31 ENCOUNTER — Other Ambulatory Visit: Payer: Self-pay | Admitting: Hematology and Oncology

## 2023-08-15 IMAGING — CR DG SHOULDER 2+V*L*
3 series · 3 of 3 positions shown · non-contrast
Comparison: None Available.

CLINICAL DATA: Left shoulder pain.

EXAM:
LEFT SHOULDER - 2+ VIEW

[w shoulder ap internal left]
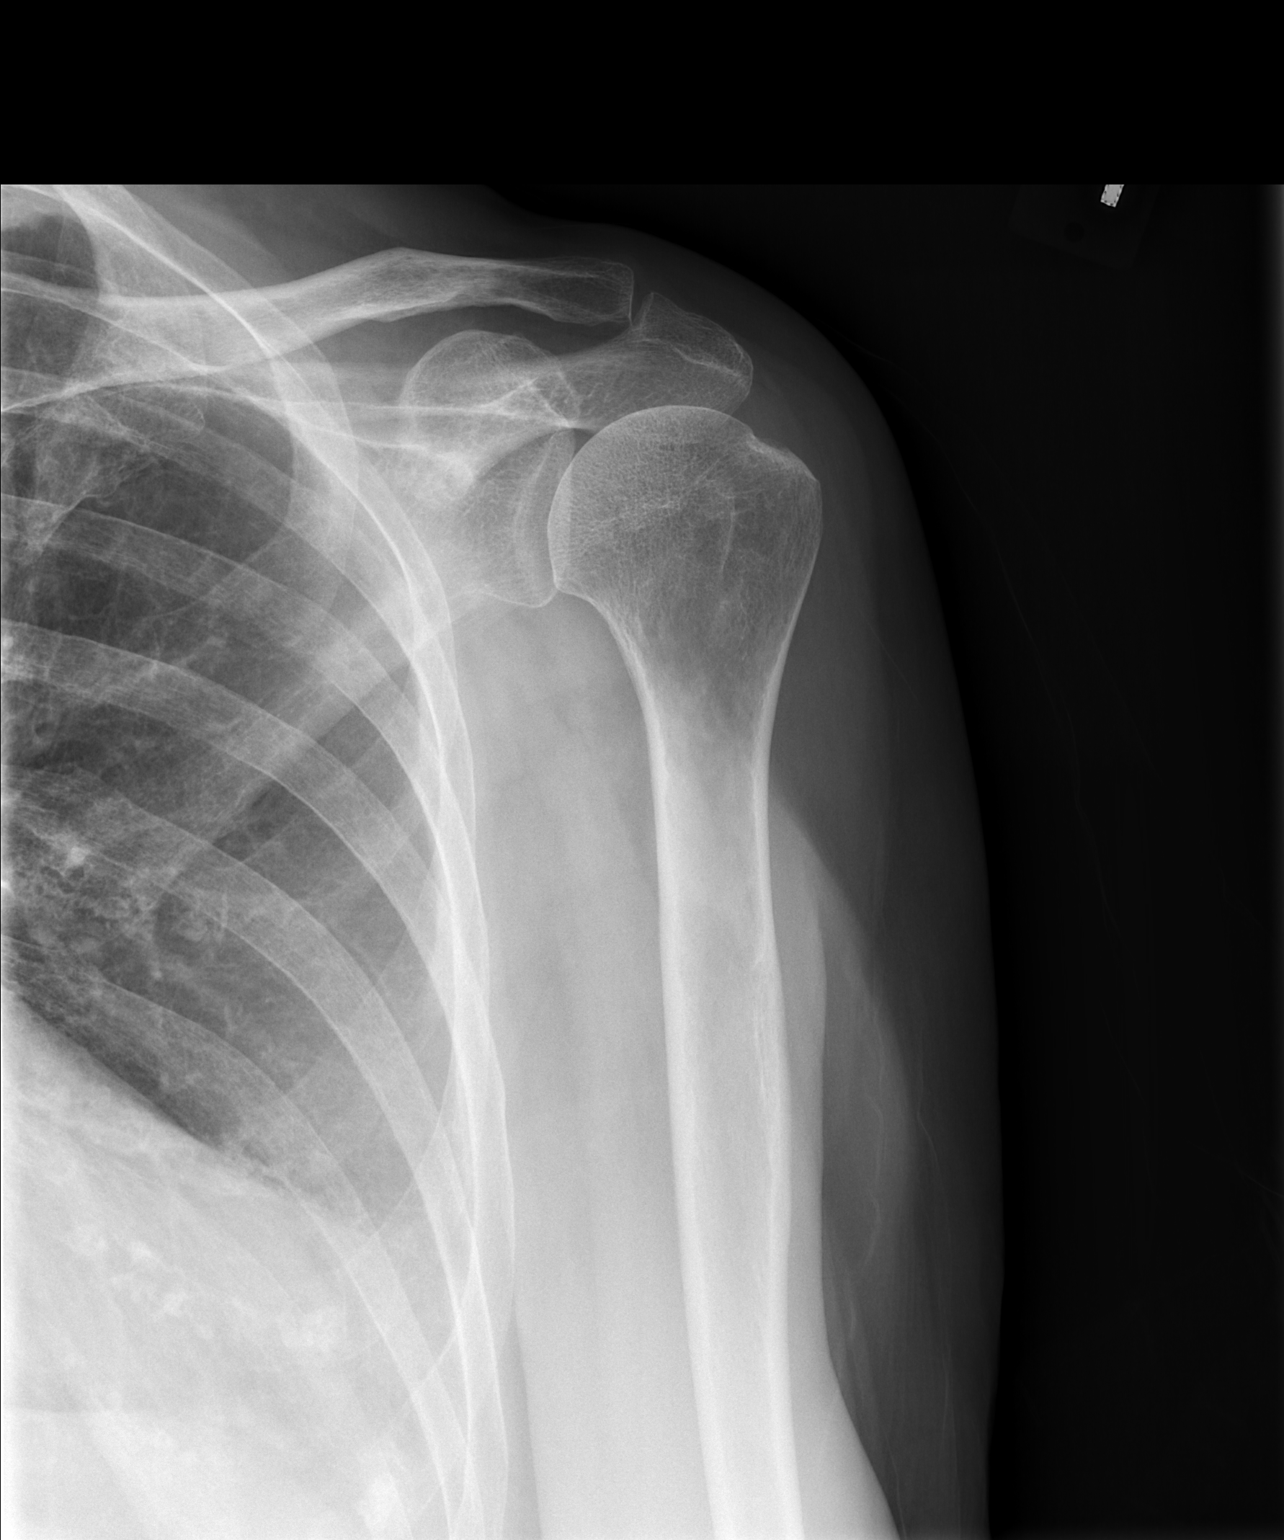

[w shoulder y view left]
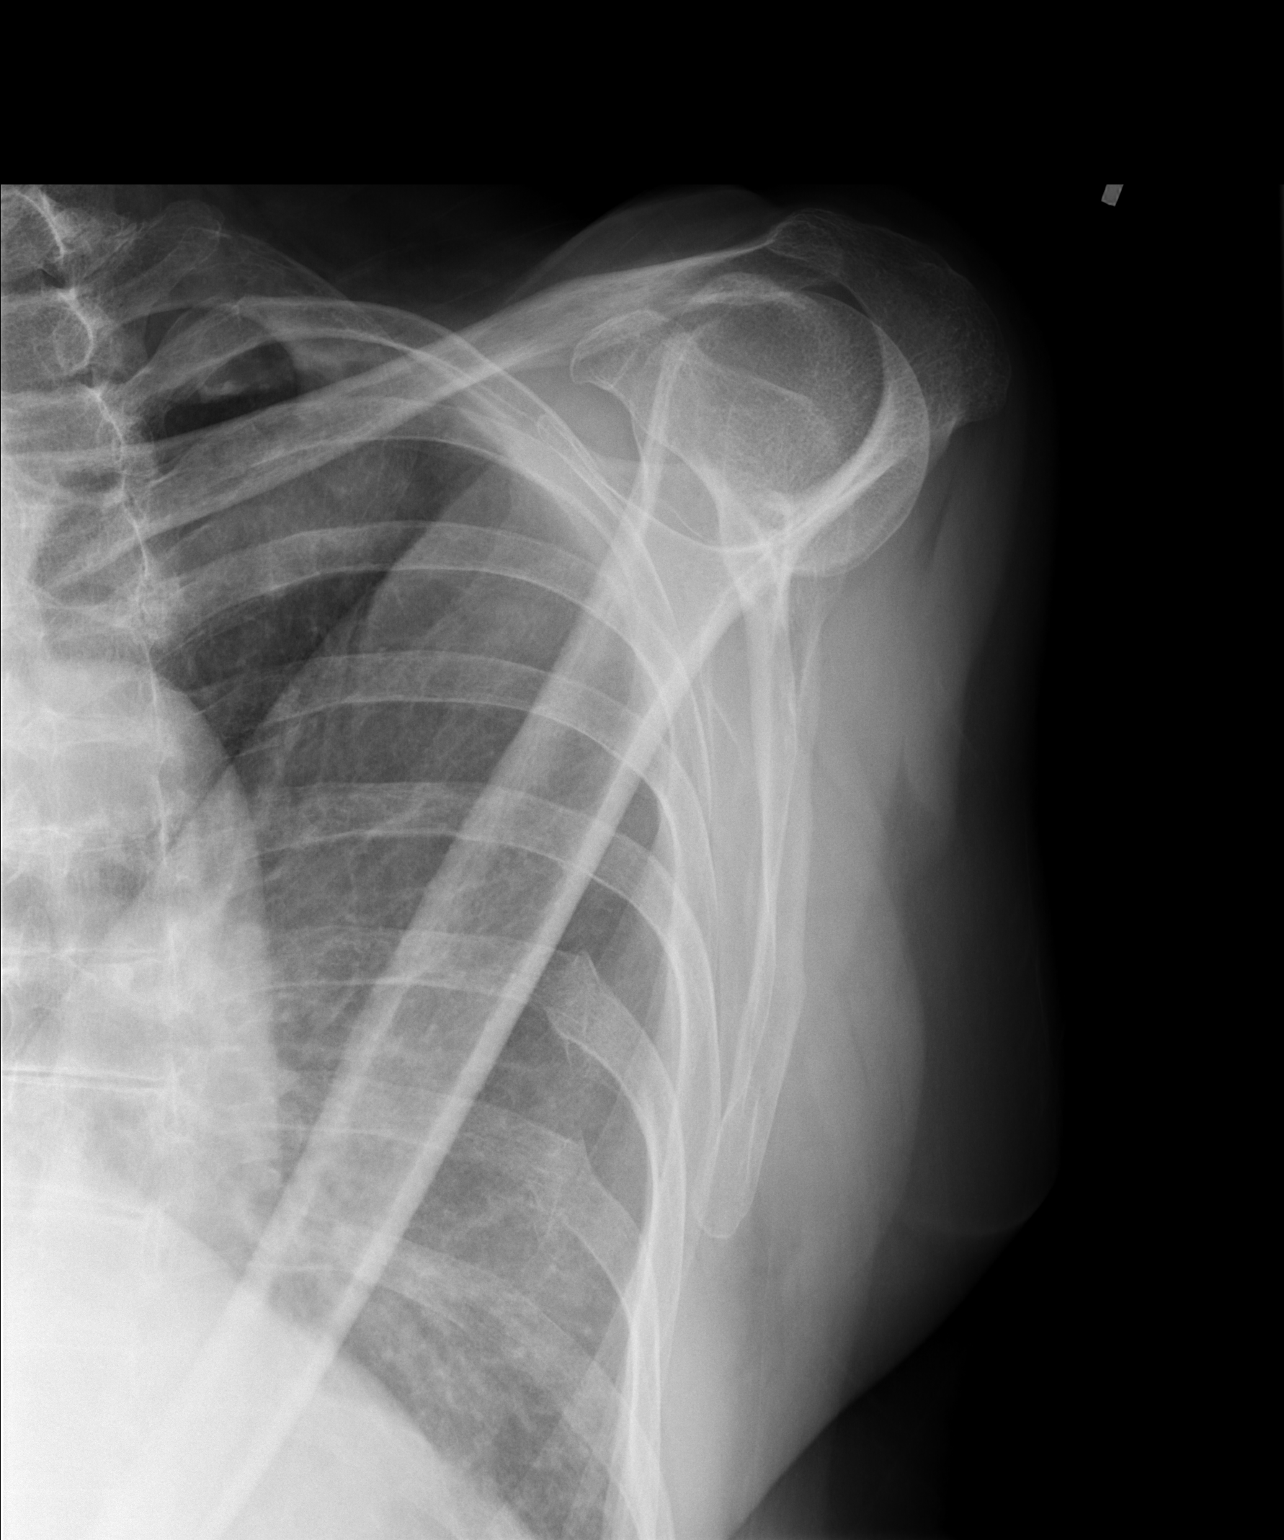

[w shoulder axillary left *]
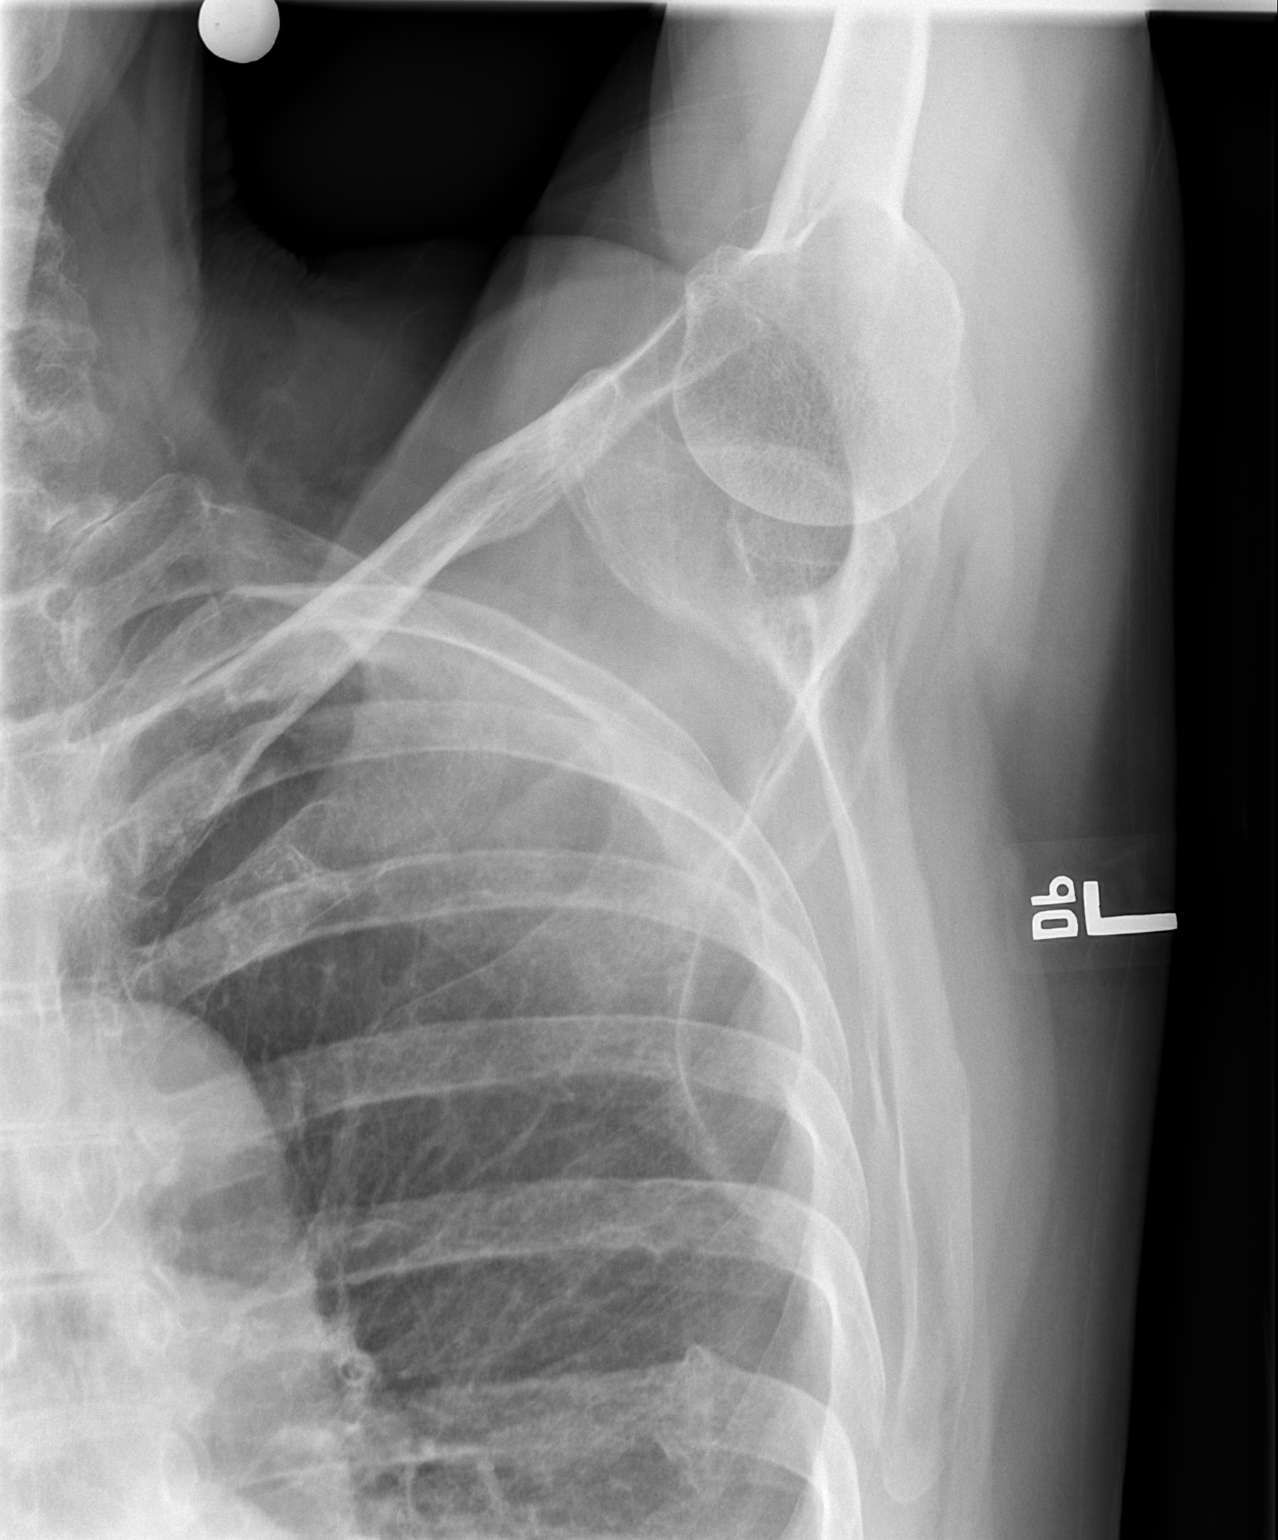

[3 of 3 positions shown; findings below may reference images not displayed]

FINDINGS: Normal alignment. No acute fracture or dislocation. Joint spaces are
preserved. Remote fractures of the left 7-9 ribs are noted.
IMPRESSION: No acute abnormality.

## 2023-08-23 DIAGNOSIS — I776 Arteritis, unspecified: Secondary | ICD-10-CM | POA: Diagnosis not present

## 2023-08-30 ENCOUNTER — Telehealth: Payer: Self-pay

## 2023-08-30 NOTE — Telephone Encounter (Signed)
 Ms. Leslie Rivera called office today stating Dr.Tucker is prescribing Estradiol (Climara) patch 0.025mg . Pt states she got a notification from her pharmacy stating the price of the patch is going up from $6 to $46. They did give her a recommendation of switching to the Estradiol Cream. She is wanting to know what Dr.Tucker thinks about her switching?   Pt is aware Dr.Tucker is out of the office today, message will be sent to Warner Mccreedy NP for advice. Our office will call patient back.  FYI pt does not currently have a follow up with Dr.Tucker. Please advise on when to schedule

## 2023-08-30 NOTE — Telephone Encounter (Signed)
 Ms.Pendland is aware of other medication options as noted by Warner Mccreedy NP. She states seh will call her insurance company and see what is covered. She will call back with what she decides.   Also, as far as a return visit. She states she is currently on Palliative/Hospice care rotation and really isn't using them much, reports currently doing monthly phone visits. She states "I am doing rather well".

## 2023-09-06 DIAGNOSIS — Z961 Presence of intraocular lens: Secondary | ICD-10-CM | POA: Diagnosis not present

## 2023-09-06 DIAGNOSIS — H04123 Dry eye syndrome of bilateral lacrimal glands: Secondary | ICD-10-CM | POA: Diagnosis not present

## 2023-09-07 DIAGNOSIS — C561 Malignant neoplasm of right ovary: Secondary | ICD-10-CM | POA: Diagnosis not present

## 2023-09-07 DIAGNOSIS — F418 Other specified anxiety disorders: Secondary | ICD-10-CM | POA: Diagnosis not present

## 2023-09-07 DIAGNOSIS — F5104 Psychophysiologic insomnia: Secondary | ICD-10-CM | POA: Diagnosis not present

## 2023-09-07 DIAGNOSIS — E039 Hypothyroidism, unspecified: Secondary | ICD-10-CM | POA: Diagnosis not present

## 2023-09-20 DIAGNOSIS — I776 Arteritis, unspecified: Secondary | ICD-10-CM | POA: Diagnosis not present

## 2023-09-28 DIAGNOSIS — L0100 Impetigo, unspecified: Secondary | ICD-10-CM | POA: Diagnosis not present

## 2023-09-28 DIAGNOSIS — Z6827 Body mass index (BMI) 27.0-27.9, adult: Secondary | ICD-10-CM | POA: Diagnosis not present

## 2023-10-02 DIAGNOSIS — N904 Leukoplakia of vulva: Secondary | ICD-10-CM | POA: Diagnosis not present

## 2023-10-27 ENCOUNTER — Other Ambulatory Visit: Payer: Self-pay

## 2023-10-27 ENCOUNTER — Ambulatory Visit
Admission: EM | Admit: 2023-10-27 | Discharge: 2023-10-27 | Disposition: A | Discharge status: Home / Self Care | Attending: Family Medicine | Admitting: Family Medicine

## 2023-10-27 DIAGNOSIS — B029 Zoster without complications: Secondary | ICD-10-CM

## 2023-10-27 MED ORDER — VALACYCLOVIR HCL 1 G PO TABS: 1000.0000 mg | ORAL_TABLET | Freq: Three times a day (TID) | ORAL | 0 refills | Status: DC

## 2023-10-27 NOTE — ED Triage Notes (Signed)
 Pt c/o rash on left side of face left neck, left chest and left breastx4d. Pt has patches of red raised bumps.

## 2023-10-27 NOTE — Discharge Instructions (Addendum)
 Start valacyclovir  3 times a day for 7 days.  Please monitor the rash on your forehead very closely and if it does go near or on your eye please go to the emergency room ASAP for further treatment as this can cause permanent visual changes.  Please avoid high risk populations until the lesions scab over.  This includes those that are immunocompromised or cannot be vaccinated for chickenpox/shingles.  You may use over-the-counter treatments as needed for itching.  Please follow-up with your PCP in 2 days for recheck.  Hope you feel better soon!

## 2023-10-27 NOTE — ED Provider Notes (Addendum)
 [facility]    CSN: [id number] Arrival date & time: 10/27/23  1326      History   Chief Complaint No chief complaint on file.   HPI Leslie Rivera is a 78 y.o. female presents for rash.  Patient [place] when she noticed couple bug bites on her arms.  This morning she woke and had a burning itchy rash on the left aspect of her forehead and a couple on her cheek.  Denies any fevers or chills.  No new contacts including soaps, medications, detergents, etc.  No contact with similar rashes.  She has been using a previous prescription for clobetasol without improvement.  She also had a as needed prescription for Valtrex  for her cold sores which she did take 1 pill today.  No other concerns at this time.  HPI  Past Medical History:  Diagnosis Date   Abdominal pain    Adenomatous colon polyp    Anemia    Iron deficiency anemia   Anxiety    Cancer (HCC)    colon   Colon cancer (HCC) 07/17/2013   Family history of breast cancer    Family history of colon cancer    Family history of melanoma    History of migraine headaches    Hypothyroidism    Internal hemorrhoids    Irregular heart beat    occ. 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[facility])     Patient Active Problem List   Diagnosis Date Noted   Hypomagnesemia 07/21/2023   Hypophosphatemia 07/21/2023   Severe sepsis (HCC) 07/20/2023   AKI (acute kidney injury) (HCC) 07/20/2023   Pneumonia due to infectious organism 07/20/2023   Hypokalemia 07/20/2023   Diarrhea 07/20/2023   Vitamin D  deficiency 01/09/2022   Joint pain 11/07/2021   Other fatigue 11/07/2021   Vitamin B12 deficiency 10/03/2021   Postoperative anemia due to acute blood loss 05/13/2021   Postoperative abdominal pain 05/12/2021   Pancytopenia, acquired (HCC) 04/14/2021   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   Genetic testing 02/09/2021   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   Goals of care, counseling/discussion 01/31/2021   Family history of colon cancer 01/24/2021   Family history of breast cancer 01/24/2021   Family history of melanoma 01/24/2021   Environmental and seasonal allergies 09/21/2017   History of colon cancer 06/11/2013    Past Surgical History:  Procedure Laterality Date   BREAST BIOPSY Right    BREAST SURGERY     removal abnormal cells in right   COLONOSCOPY     multiple   ESOPHAGOGASTRODUODENOSCOPY (EGD) WITH PROPOFOL  N/A 12/21/2020   Procedure: ESOPHAGOGASTRODUODENOSCOPY (EGD) WITH PROPOFOL ;  Surgeon: Alvis Jourdain, MD;  Location: [facility];  Service: Endoscopy;  Laterality: N/A;   EUS N/A 12/21/2020   Procedure: UPPER ENDOSCOPIC ULTRASOUND (EUS) LINEAR;  Surgeon: Alvis Jourdain, MD;  Location: [facility];  Service: Endoscopy;  Laterality: N/A;   LAPAROSCOPIC RIGHT COLECTOMY Right 07/17/2013   Procedure: LAPAROSCOPIC ASSISTED  RIGHT COLECTOMY;  Surgeon: Harlee Lichtenstein, MD;  Location: [facility];  Service: General;  Laterality: Right;   TONSILLECTOMY     teenager- age 82    OB History     Gravida  0   Para  0   Term  0   Preterm  0   AB  0   Living  0  SAB  0   IAB  0   Ectopic  0   Multiple  0   Live Births  0            Home Medications    Prior to Admission medications   Medication Sig Start Date End Date Taking? Authorizing Provider  valACYclovir  (VALTREX ) 1000 MG tablet Take 1 tablet (1,000 mg total) by mouth 3 (three) times daily for 7 days. 10/27/23 11/03/23 Yes Aleigh Grunden, Jodi R, NP  clobetasol ointment (TEMOVATE) 0.05 % Apply 1 Application topically See admin instructions. Apply a thin layer to the vulva every day as directed    [provider]  Cyanocobalamin  (VITAMIN B-12) 5000 MCG SUBL Place 5,000 mcg under the tongue daily.    [provider]  diazepam  (VALIUM ) 5 MG tablet Take 1 tablet (5 mg total) by mouth daily as needed for  anxiety. Patient not taking: Reported on 07/21/2023 03/14/22   Almeda Jacobs, MD  escitalopram  (LEXAPRO ) 10 MG tablet TAKE 1 & 1/2 (ONE & ONE-HALF) TABLETS BY MOUTH ONCE DAILY 07/30/23   Almeda Jacobs, MD  estradiol  (CLIMARA ) 0.025 mg/24hr patch Place 1 patch (0.025 mg total) onto the skin once a week. 02/06/23   Suzi Essex, MD  Ginger-Calcium Carbonate (DRAMAMINE GINGER CHEWS PO) Take by mouth.    Almeda Jacobs, MD  ibuprofen  (ADVIL ) 200 MG tablet Take 200 mg by mouth every 6 (six) hours as needed for mild pain (pain score 1-3) or headache (IN CONJUNCTION WITH ONE 325 MG. TYLENOL ).    [provider]  levothyroxine  (SYNTHROID , LEVOTHROID) 75 MCG tablet Take 75 mcg by mouth daily before breakfast. 04/27/15   [provider]  oxyCODONE -acetaminophen  (PERCOCET/ROXICET) 5-325 MG tablet Take 1 tablet by mouth every 6 (six) hours as needed for severe pain. Patient taking differently: Take 0.25-1 tablets by mouth every 6 (six) hours as needed for severe pain (pain score 7-10). 08/18/22   Almeda Jacobs, MD  Polyethyl Glycol-Propyl Glycol (SYSTANE) 0.4-0.3 % SOLN Place 1-2 drops into both eyes 3 (three) times daily as needed (dry/irritated eyes.).    [provider]  prochlorperazine  (COMPAZINE ) 10 MG tablet Take 1 tablet (10 mg total) by mouth every 6 (six) hours as needed (Nausea or vomiting). 02/11/21   Almeda Jacobs, MD  TYLENOL  325 MG tablet Take 325 mg by mouth every 6 (six) hours as needed for mild pain (pain score 1-3) or headache (IN CONJUNCTION WITH ONE 200 MG. IBUPROFEN ).    [provider]  valACYclovir  (VALTREX ) 1000 MG tablet Take 1 tablet (1,000 mg total) by mouth daily as needed (Fever Blister). Patient not taking: Reported on 07/21/2023 03/14/22   Almeda Jacobs, MD  zolpidem  (AMBIEN ) 10 MG tablet TAKE 1 TABLET BY MOUTH AT BEDTIME AS NEEDED FOR SLEEP 08/01/23   Almeda Jacobs, MD    Family History Family History  Problem Relation Age of Onset   Cancer Brother         internal melanoma   Cancer Maternal Grandmother        colon   Ovarian cancer Neg Hx    Endometrial cancer Neg Hx    Pancreatic cancer Neg Hx    Prostate cancer Neg Hx     Social History Social History   Tobacco Use   Smoking status: Former    Current packs/day: 0.00    Types: Cigarettes    Quit date: 06/11/1997    Years since quitting: 26.3   Smokeless tobacco: Never  Vaping Use  Vaping status: Never Used  Substance Use Topics   Alcohol use: Yes    Alcohol/week: 4.0 standard drinks of alcohol    Types: 2 Cans of beer, 2 Shots of liquor per week   Drug use: No     Allergies   Barium-containing compounds, Carboplatin , Hydrocodone, Erythromycin, and Zofran  [ondansetron ]   Review of Systems Review of Systems  Skin:  Positive for rash.     Physical Exam Triage Vital Signs ED Triage Vitals  Encounter Vitals Group     BP 10/27/23 1345 105/73     Systolic BP Percentile --      Diastolic BP Percentile --      Pulse Rate 10/27/23 1345 77     Resp 10/27/23 1345 17     Temp 10/27/23 1345 97.8 F (36.6 C)     Temp Source 10/27/23 1345 Oral     SpO2 10/27/23 1345 96 %     Weight --      Height --      Head Circumference --      Peak Flow --      Pain Score 10/27/23 1341 0     Pain Loc --      Pain Education --      Exclude from Growth Chart --    No data found.  Updated Vital Signs BP 105/73   Pulse 77   Temp 97.8 F (36.6 C) (Oral)   Resp 17   SpO2 96%   Visual Acuity Right Eye Distance:   Left Eye Distance:   Bilateral Distance:    Right Eye Near:   Left Eye Near:    Bilateral Near:     Physical Exam Vitals and nursing note reviewed.  Constitutional:      General: She is not in acute distress.    Appearance: Normal appearance. She is not ill-appearing.  HENT:     Head: Normocephalic and atraumatic.      Comments: There is a erythematous macular/vesicular rash to the left forehead and a small patch to the left cheek.  There is no swelling  drainage.  There is no rash around eye or on eyelid. Eyes:     Pupils: Pupils are equal, round, and reactive to light.  Cardiovascular:     Rate and Rhythm: Normal rate.  Pulmonary:     Effort: Pulmonary effort is normal.  Skin:    General: Skin is warm and dry.     Comments: There are scattered bug bites on bilateral arms as well as on chest wall and neck.  Neurological:     General: No focal deficit present.     Mental Status: She is alert and oriented to person, place, and time.  Psychiatric:        Mood and Affect: Mood normal.        Behavior: Behavior normal.      UC Treatments / Results  Labs (all labs ordered are listed, but only abnormal results are displayed) Labs Reviewed - No data to display  tains abnormal data Basic metabolic panel Order: [id number]  Status: Final result     Next appt: None   Test Result Released: Yes (seen)   0 Result Notes          Component Ref Range & Units (hover) 3 mo ago (07/25/23) 3 mo ago (07/24/23) 3 mo ago (07/24/23) 3 mo ago (07/23/23) 3 mo ago (07/22/23) 3 mo ago (07/21/23) 3 mo ago (07/20/23)  Sodium 141 138 134  Low  136 136 135 132 Low   Potassium 4.2 3.6 2.7 Low Panic  CM 3.3 Low  4.3 2.7 Low Panic  CM 3.1 Low   Chloride 114 High  111 109 111 114 High  110 102  CO2 17 Low  18 Low  16 Low  16 Low  13 Low  15 Low  15 Low   Glucose, Bld 89 85 CM 100 High  CM 99 CM 80 CM 122 High  CM 137 High  CM  Comment: Glucose reference range applies only to samples taken after fasting for at least 8 hours.  BUN 14 19 20 23 23  37 High  58 High   Creatinine, Ser 0.40 Low  0.80 0.78 1.01 High  0.99 1.11 High  1.69 High   Calcium 8.3 Low  8.0 Low  7.3 Low  7.7 Low  7.8 Low  7.2 Low  7.5 Low   GFR, Estimated >60 >60 CM >60 CM 58 Low  CM 59 Low  CM 52 Low  CM 31 Low  CM  Comment: (NOTE) Calculated using the CKD-EPI Creatinine Equation (2021)  Anion gap 10 9 CM 9 CM 9 CM 9 CM 10 CM 15 CM  Comment: Performed at [facility], [address], [place], [place] [address]  Resulting Agency [facility] CH CL[facility] LAB [facility] CL[facility] LAB        Specimen Collected: 07/25/23 05:47 Last Resulted: 07/25/23 06:45    EKG   Radiology No results found.  Procedures Procedures (including critical care time)  Medications Ordered in UC Medications - No data to display  Initial Impression / Assessment and Plan / UC Course  I have reviewed the triage vital signs and the nursing notes.  Pertinent labs & imaging results that were available during my care of the patient were reviewed by me and considered in my medical decision making (see chart for details).     Reviewed exam and symptoms with patient.  No red flags.  She is well-appearing and in no acute distress.  Discussed rash on forehead consistent with shingles.  Does not appear to be encroaching on eye or orbital area.  Will start Valtrex  3 times a day for 7 days.  She can continue clobetasol to the bug bite areas as needed.  Discussed avoidance of high risk populations until symptoms resolve.  Did advise her to see her PCP in 2 days for recheck.  She was instructed to go to the ER for any worsening symptoms which includes the rash encroaching onto her eyelid or close to her eye and she verbalized understanding. Final Clinical Impressions(s) / UC Diagnoses   Final diagnoses:  Herpes zoster without complication     Discharge Instructions      Start valacyclovir  3 times a day for 7 days.  Please monitor the rash on your forehead very closely and if it does go near or on your eye please go to the emergency room ASAP for further treatment as this can cause permanent visual changes.  Please avoid high risk populations until the lesions scab over.  This includes those that are immunocompromised or cannot be vaccinated for chickenpox/shingles.  You may use over-the-counter treatments as needed for itching.  Please follow-up with your PCP  in 2 days for recheck.  Hope you feel better soon!    ED Prescriptions     Medication Sig Dispense Auth. Provider  valACYclovir  (VALTREX ) 1000 MG tablet Take 1 tablet (1,000 mg total) by mouth 3 (three) times daily for 7 days. 21 tablet Vence Lalor, Jodi R, NP      PDMP not reviewed this encounter.   Alleen Arbour, NP 10/27/23 1414    Alleen Arbour, NP 10/27/23 1415

## 2023-10-29 DIAGNOSIS — Z6827 Body mass index (BMI) 27.0-27.9, adult: Secondary | ICD-10-CM | POA: Diagnosis not present

## 2023-10-29 DIAGNOSIS — B027 Disseminated zoster: Secondary | ICD-10-CM | POA: Diagnosis not present

## 2023-10-30 ENCOUNTER — Inpatient Hospital Stay (HOSPITAL_COMMUNITY)
Admission: EM | Admit: 2023-10-30 | Discharge: 2023-11-01 | DRG: 866 | Disposition: A | Discharge status: Home / Self Care | Attending: Family Medicine | Admitting: Family Medicine

## 2023-10-30 ENCOUNTER — Encounter (HOSPITAL_COMMUNITY): Payer: Self-pay | Admitting: Emergency Medicine

## 2023-10-30 ENCOUNTER — Other Ambulatory Visit: Payer: Self-pay

## 2023-10-30 DIAGNOSIS — Z881 Allergy status to other antibiotic agents status: Secondary | ICD-10-CM | POA: Diagnosis not present

## 2023-10-30 DIAGNOSIS — F419 Anxiety disorder, unspecified: Secondary | ICD-10-CM | POA: Diagnosis present

## 2023-10-30 DIAGNOSIS — Z9221 Personal history of antineoplastic chemotherapy: Secondary | ICD-10-CM

## 2023-10-30 DIAGNOSIS — Z87891 Personal history of nicotine dependence: Secondary | ICD-10-CM | POA: Diagnosis not present

## 2023-10-30 DIAGNOSIS — B029 Zoster without complications: Secondary | ICD-10-CM | POA: Diagnosis not present

## 2023-10-30 DIAGNOSIS — D849 Immunodeficiency, unspecified: Secondary | ICD-10-CM | POA: Diagnosis present

## 2023-10-30 DIAGNOSIS — B027 Disseminated zoster: Secondary | ICD-10-CM | POA: Diagnosis not present

## 2023-10-30 DIAGNOSIS — Z85038 Personal history of other malignant neoplasm of large intestine: Secondary | ICD-10-CM

## 2023-10-30 DIAGNOSIS — Z79890 Hormone replacement therapy (postmenopausal): Secondary | ICD-10-CM | POA: Diagnosis not present

## 2023-10-30 DIAGNOSIS — E039 Hypothyroidism, unspecified: Secondary | ICD-10-CM | POA: Diagnosis not present

## 2023-10-30 DIAGNOSIS — Z8543 Personal history of malignant neoplasm of ovary: Secondary | ICD-10-CM

## 2023-10-30 DIAGNOSIS — B0239 Other herpes zoster eye disease: Secondary | ICD-10-CM | POA: Diagnosis not present

## 2023-10-30 DIAGNOSIS — Z66 Do not resuscitate: Secondary | ICD-10-CM | POA: Diagnosis present

## 2023-10-30 DIAGNOSIS — C786 Secondary malignant neoplasm of retroperitoneum and peritoneum: Secondary | ICD-10-CM | POA: Diagnosis present

## 2023-10-30 DIAGNOSIS — G4700 Insomnia, unspecified: Secondary | ICD-10-CM | POA: Diagnosis not present

## 2023-10-30 DIAGNOSIS — Z90710 Acquired absence of both cervix and uterus: Secondary | ICD-10-CM

## 2023-10-30 DIAGNOSIS — Z888 Allergy status to other drugs, medicaments and biological substances status: Secondary | ICD-10-CM | POA: Diagnosis not present

## 2023-10-30 DIAGNOSIS — Z90722 Acquired absence of ovaries, bilateral: Secondary | ICD-10-CM | POA: Diagnosis not present

## 2023-10-30 DIAGNOSIS — Z79899 Other long term (current) drug therapy: Secondary | ICD-10-CM | POA: Diagnosis not present

## 2023-10-30 LAB — BASIC METABOLIC PANEL WITH GFR
Anion gap: 10 (ref 5–15)
BUN: 27 mg/dL — ABNORMAL HIGH (ref 8–23)
CO2: 19 mmol/L — ABNORMAL LOW (ref 22–32)
Calcium: 9.4 mg/dL (ref 8.9–10.3)
Chloride: 107 mmol/L (ref 98–111)
Creatinine, Ser: 0.56 mg/dL (ref 0.44–1.00)
GFR, Estimated: >60 mL/min (ref >60)
Glucose, Bld: 100 mg/dL — ABNORMAL HIGH (ref 70–99)
Potassium: 3.9 mmol/L (ref 3.5–5.1)
Sodium: 136 mmol/L (ref 135–145)

## 2023-10-30 LAB — CBC
HCT: 41.6 % (ref 36.0–46.0)
Hemoglobin: 13.6 g/dL (ref 12.0–15.0)
MCH: 31.4 pg (ref 26.0–34.0)
MCHC: 32.7 g/dL (ref 30.0–36.0)
MCV: 96.1 fL (ref 80.0–100.0)
Platelets: 276 10*3/uL (ref 150–400)
RBC: 4.33 MIL/uL (ref 3.87–5.11)
RDW: 13.5 % (ref 11.5–15.5)
WBC: 7.4 10*3/uL (ref 4.0–10.5)
nRBC: 0 % (ref 0.0–0.2)

## 2023-10-30 MED ORDER — ESCITALOPRAM OXALATE 10 MG PO TABS
15.0000 mg | ORAL_TABLET | Freq: Every day | ORAL | Status: DC
  Administered 2023-10-31 – 2023-11-01 (×2): 15 mg via ORAL
  Filled 2023-10-30 (×2): qty 2

## 2023-10-30 MED ORDER — ACYCLOVIR SODIUM 50 MG/ML IV SOLN
10.0000 mg/kg | Freq: Three times a day (TID) | INTRAVENOUS | Status: DC
  Administered 2023-10-30 – 2023-11-01 (×6): 750 mg via INTRAVENOUS
  Filled 2023-10-30 (×8): qty 15

## 2023-10-30 MED ORDER — LEVOTHYROXINE SODIUM 75 MCG PO TABS
75.0000 ug | ORAL_TABLET | Freq: Every day | ORAL | Status: DC
  Administered 2023-10-31 – 2023-11-01 (×2): 75 ug via ORAL
  Filled 2023-10-30 (×3): qty 1

## 2023-10-30 MED ORDER — ALBUTEROL SULFATE (2.5 MG/3ML) 0.083% IN NEBU: 2.5000 mg | INHALATION_SOLUTION | RESPIRATORY_TRACT | Status: DC | PRN

## 2023-10-30 MED ORDER — ESTRADIOL 0.025 MG/24HR TD PTWK
0.0250 mg | MEDICATED_PATCH | TRANSDERMAL | Status: DC
  Administered 2023-10-31: 0.025 mg via TRANSDERMAL
  Filled 2023-10-30: qty 1

## 2023-10-30 MED ORDER — LACTATED RINGERS IV SOLN: INTRAVENOUS | Status: DC

## 2023-10-30 MED ORDER — OXYCODONE-ACETAMINOPHEN 5-325 MG PO TABS
1.0000 | ORAL_TABLET | Freq: Four times a day (QID) | ORAL | Status: DC | PRN
  Administered 2023-10-30 – 2023-11-01 (×5): 1 via ORAL
  Filled 2023-10-30 (×5): qty 1

## 2023-10-30 MED ORDER — ENOXAPARIN SODIUM 40 MG/0.4ML IJ SOSY
40.0000 mg | PREFILLED_SYRINGE | Freq: Every day | INTRAMUSCULAR | Status: DC
  Administered 2023-10-30 – 2023-10-31 (×2): 40 mg via SUBCUTANEOUS
  Filled 2023-10-30 (×2): qty 0.4

## 2023-10-30 MED ORDER — FOLIC ACID 1 MG PO TABS
1.0000 mg | ORAL_TABLET | Freq: Every day | ORAL | Status: DC
  Administered 2023-10-30 – 2023-11-01 (×3): 1 mg via ORAL
  Filled 2023-10-30 (×3): qty 1

## 2023-10-30 MED ORDER — SODIUM CHLORIDE 0.9% FLUSH
3.0000 mL | Freq: Two times a day (BID) | INTRAVENOUS | Status: DC
  Administered 2023-11-01: 3 mL via INTRAVENOUS

## 2023-10-30 MED ORDER — POLYVINYL ALCOHOL 1.4 % OP SOLN: 1.0000 [drp] | OPHTHALMIC | Status: DC | PRN

## 2023-10-30 MED ORDER — ACETAMINOPHEN 325 MG PO TABS
325.0000 mg | ORAL_TABLET | Freq: Four times a day (QID) | ORAL | Status: DC | PRN
  Administered 2023-10-31 (×2): 325 mg via ORAL
  Filled 2023-10-30 (×2): qty 1

## 2023-10-30 MED ORDER — SODIUM CHLORIDE 0.9 % IV SOLN: INTRAVENOUS | Status: DC

## 2023-10-30 MED ORDER — IBUPROFEN 200 MG PO TABS
200.0000 mg | ORAL_TABLET | Freq: Four times a day (QID) | ORAL | Status: DC | PRN
Start: 2023-10-30 — End: 2023-11-01

## 2023-10-30 MED ORDER — CLOBETASOL PROPIONATE 0.05 % EX OINT
1.0000 | TOPICAL_OINTMENT | Freq: Every day | CUTANEOUS | Status: DC
  Administered 2023-10-31 – 2023-11-01 (×2): 1 via TOPICAL
  Filled 2023-10-30: qty 15

## 2023-10-30 MED ORDER — ZOLPIDEM TARTRATE 5 MG PO TABS
5.0000 mg | ORAL_TABLET | Freq: Every evening | ORAL | Status: DC | PRN
  Administered 2023-10-30 – 2023-10-31 (×2): 5 mg via ORAL
  Filled 2023-10-30 (×2): qty 1

## 2023-10-30 MED ORDER — POLYETHYL GLYCOL-PROPYL GLYCOL 0.4-0.3 % OP SOLN: 1.0000 [drp] | Freq: Three times a day (TID) | OPHTHALMIC | Status: DC | PRN

## 2023-10-30 MED ORDER — BISACODYL 10 MG RE SUPP: 10.0000 mg | Freq: Every day | RECTAL | Status: DC | PRN

## 2023-10-30 NOTE — ED Notes (Signed)
Patient resting in bed with family at bedside.

## 2023-10-30 NOTE — H&P (Signed)
 History and Physical    Patient: Leslie Rivera PPI:[id number] DOB: 06-Dec-1945 DOA: 10/30/2023 DOS: the patient was seen and examined on 10/30/2023 PCP: Virgle Grime, MD  Patient coming from: Home  Chief Complaint:  Chief Complaint  Patient presents with   Herpes Zoster  Patient was referred from primary care physician's office on account of suspected disseminated herpes zoster infections. HPI: Leslie Rivera is a 78 y.o. 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  Patient decided to discontinue all treatment for the past 1 year.  Other comorbidities include hypothyroidism and an anxiety disorder.  She presented to the emergency room on account of new onset of rashes.  Rashes initially involved the left side of the face.  Onset of symptoms was 5 days ago, it was initially crusty.  She was seen in urgent care on 05/03 and was initiated on treatment for presumable herpes zoster infection. Despite compliance with acyclovir, she has noted spread of lesions now involving left side of the chest and bilateral arms.  She returned to see her primary care physician who subsequently referred patient to the emergency room for suspected disseminated herpes zoster.  Patient denies any significant pain of the lesions.  Denied any fever or chills.  Denies any visual symptoms.  Denies nausea or vomiting.  In the ED, acyclovir IV has been ordered and pending.  Patient was referred to hospitalist service for admission and further treatment.  Review of Systems: As mentioned in the history of present illness. All other systems reviewed and are negative. Past Medical History:  Diagnosis Date   Abdominal pain    Adenomatous colon polyp    Anemia    Iron deficiency anemia   Anxiety    Cancer (HCC)    colon   Colon cancer (HCC) 07/17/2013   Family history of breast cancer    Family history of colon cancer    Family history  of melanoma    History of migraine headaches    Hypothyroidism    Internal hemorrhoids    Irregular heart beat    occ. 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[facility])    Past Surgical History:  Procedure Laterality Date   BREAST BIOPSY Right    BREAST SURGERY     removal abnormal cells in right   COLONOSCOPY     multiple   ESOPHAGOGASTRODUODENOSCOPY (EGD) WITH PROPOFOL  N/A 12/21/2020   Procedure: ESOPHAGOGASTRODUODENOSCOPY (EGD) WITH PROPOFOL ;  Surgeon: Alvis Jourdain, MD;  [facility];  Service: Endoscopy;  Laterality: N/A;   EUS N/A 12/21/2020   Procedure: UPPER ENDOSCOPIC ULTRASOUND (EUS) LINEAR;  Surgeon: Alvis Jourdain, MD;  [facility];  Service: Endoscopy;  Laterality: N/A;   LAPAROSCOPIC RIGHT COLECTOMY Right 07/17/2013   Procedure: LAPAROSCOPIC ASSISTED  RIGHT COLECTOMY;  Surgeon: Harlee Lichtenstein, MD;  Location: [facility];  Service: General;  Laterality: Right;   TONSILLECTOMY     teenager- age 23   Social History:  reports that she quit smoking about 26 years ago. Her smoking use included cigarettes. She has never used smokeless tobacco. She reports current alcohol use of about 4.0 standard drinks of alcohol per week. She reports that she does not use drugs.  Allergies  Allergen Reactions   Barium-Containing Compounds Other (See Comments)    Legs and arm cramping & caused fall   Carboplatin  Other (See Comments)    Hypersensitivity- see note 11/16/22  Hydrocodone Nausea And Vomiting and Other (See Comments)    Can tolerate Oxycodone    Erythromycin Other (See Comments)    GI upset   Zofran  [Ondansetron ] Other (See Comments)    Headache-Never wants  again    Family History  Problem Relation Age of Onset   Cancer Brother        internal melanoma   Cancer Maternal Grandmother        colon   Ovarian cancer Neg Hx    Endometrial cancer Neg Hx    Pancreatic cancer Neg Hx    Prostate cancer Neg Hx     Prior to Admission  medications   Medication Sig Start Date End Date Taking? Authorizing Provider  clobetasol ointment (TEMOVATE) 0.05 % Apply 1 Application topically See admin instructions. Apply a thin layer to the vulva every day as directed    [provider]  Cyanocobalamin  (VITAMIN B-12) 5000 MCG SUBL Place 5,000 mcg under the tongue daily.    [provider]  diazepam  (VALIUM ) 5 MG tablet Take 1 tablet (5 mg total) by mouth daily as needed for anxiety. Patient not taking: Reported on 07/21/2023 03/14/22   Almeda Jacobs, MD  escitalopram  (LEXAPRO ) 10 MG tablet TAKE 1 & 1/2 (ONE & ONE-HALF) TABLETS BY MOUTH ONCE DAILY 07/30/23   Almeda Jacobs, MD  estradiol  (CLIMARA ) 0.025 mg/24hr patch Place 1 patch (0.025 mg total) onto the skin once a week. 02/06/23   Suzi Essex, MD  Ginger-Calcium Carbonate (DRAMAMINE GINGER CHEWS PO) Take by mouth.    Almeda Jacobs, MD  ibuprofen  (ADVIL ) 200 MG tablet Take 200 mg by mouth every 6 (six) hours as needed for mild pain (pain score 1-3) or headache (IN CONJUNCTION WITH ONE 325 MG. TYLENOL ).    [provider]  levothyroxine  (SYNTHROID , LEVOTHROID) 75 MCG tablet Take 75 mcg by mouth daily before breakfast. 04/27/15   [provider]  oxyCODONE -acetaminophen  (PERCOCET/ROXICET) 5-325 MG tablet Take 1 tablet by mouth every 6 (six) hours as needed for severe pain. Patient taking differently: Take 0.25-1 tablets by mouth every 6 (six) hours as needed for severe pain (pain score 7-10). 08/18/22   Almeda Jacobs, MD  Polyethyl Glycol-Propyl Glycol (SYSTANE) 0.4-0.3 % SOLN Place 1-2 drops into both eyes 3 (three) times daily as needed (dry/irritated eyes.).    [provider]  prochlorperazine  (COMPAZINE ) 10 MG tablet Take 1 tablet (10 mg total) by mouth every 6 (six) hours as needed (Nausea or vomiting). 02/11/21   Almeda Jacobs, MD  TYLENOL  325 MG tablet Take 325 mg by mouth every 6 (six) hours as needed for mild pain (pain score 1-3) or headache (IN  CONJUNCTION WITH ONE 200 MG. IBUPROFEN ).    [provider]  valACYclovir  (VALTREX ) 1000 MG tablet Take 1 tablet (1,000 mg total) by mouth daily as needed (Fever Blister). Patient not taking: Reported on 07/21/2023 03/14/22   Almeda Jacobs, MD  valACYclovir  (VALTREX ) 1000 MG tablet Take 1 tablet (1,000 mg total) by mouth 3 (three) times daily for 7 days. 10/27/23 11/03/23  Alleen Arbour, NP  zolpidem  (AMBIEN ) 10 MG tablet TAKE 1 TABLET BY MOUTH AT BEDTIME AS NEEDED FOR SLEEP 08/01/23   Almeda Jacobs, MD    Physical Exam: Vitals:   10/30/23 1737 10/30/23 1829  BP: 134/85   Pulse: 66   Resp: 16   Temp: 97.9 F (36.6 C)   SpO2: 97%   Weight:  74.8 kg  Height:  5' 5.5" (1.664 m)  General: Patient is a pleasant, well appearing, well-nourished female.  No acute distress. Skin: Significant for erythematous rash with some crust involving the left side of the face.  Slight lesions noted on chin.  Lesions were also seen on the left side of the chest wall and some lesions on the right upper arm. Chest: No use of respiratory muscles.  Diminished breath sounds bilaterally. Cardiovascular: Regular S1-S2 with no murmur. CNS: Shows no focal deficit. Psych: Alert oriented.  Mood stable.  Data Reviewed: Labs reviewed shows sodium 136, potassium 3.9, chloride 107, bicarb 1900, BUN 27, creatinine 0.56 WBC 7.4, hemoglobin 13.6, hematocrit 42,Plt 276   Assessment and Plan: 78 year old patient with history of colon cancer, ovarian cancer with metastasis.  Currently not on chemotherapy or radiation treatment.  Presents on account of new onset rashes involving facial dermatome that has worsened to involve chest wall.  Patient was diagnosed with herpes zoster in urgent care and in clinic.  And currently on acyclovir without improvement.  Patient was referred to hospitalist service for further evaluation and management of suspected disseminated VZV  1.  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  Involving multiple dermatomes.  Patient however has not have any tenderness of her lesions which makes it atypical.  Denies any history of contact dermatitis or atopic dermatitis.  Patient was initiated on IV acyclovir in the ER.  Will consult with infectious disease to evaluate and advise.  2.  History of colon cancer and ovarian cancer with metastatic disease.  Patient previously with total hysterectomy and bilateral salpingo-oophorectomy in 2022.  She received chemotherapy but opted to discontinue all therapy in 2024.  Patient is being followed up by palliative care team at home.  3.  Hypothyroidism: Asymptomatic.  Continue with Synthroid .  4.  Anxiety disorder: On Lexapro  at home.  As needed Valium  as needed.  5.  Insomnia: On Ambien .   Advance Care Planning:   Code Status: Limited: Do not attempt resuscitation (DNR) -DNR-LIMITED -Do Not Intubate/DNI    Consults: ID will be consulted in a.m. if lesions do not improve with treatment  Family Communication: Sister at bedside were updated  Severity of Illness: The appropriate patient status for this patient is INPATIENT. Inpatient status is judged to be reasonable and necessary in order to provide the required intensity of service to ensure the patient's safety. [name] presenting symptoms, physical exam findings, and initial radiographic and laboratory data in the context of their chronic comorbidities is felt to place them at high risk for further clinical deterioration. Furthermore, it is not anticipated that the patient will be medically stable for discharge from the hospital within 2 midnights of admission.   * I certify that at the point of admission it is my clinical judgment that the patient will require inpatient hospital care spanning beyond 2 midnights from the point of admission due to high intensity of service, high risk for further deterioration and high frequency of surveillance  required.*  Author: Theodora Fish, MD 10/30/2023 8:16 PM  For on call review [email or web address].

## 2023-10-30 NOTE — ED Notes (Signed)
 Patient resting In bed

## 2023-10-30 NOTE — ED Notes (Signed)
 Patient resting in bed.

## 2023-10-30 NOTE — ED Notes (Signed)
 Patient provided with cool cloths for comfort as mentioned to nurse they help her face from the itching and burning

## 2023-10-30 NOTE — ED Triage Notes (Signed)
 Pt reports shingles that have spread to her face and both breasts. Pt concerned that it will spread to her eyes. Pt is taking valacyclovir  with reports of no improvement.

## 2023-10-30 NOTE — ED Provider Notes (Signed)
 [place] EMERGENCY DEPARTMENT AT [facility] Provider Note   CSN: [id number] Arrival date & time: 10/30/23  1729     History  Chief Complaint  Patient presents with   Herpes Zoster    Leslie Rivera is a 78 y.o. female presented to ED with concern for rash spreading.  Patient reports her rash began on her left forehead 5 days ago at the end of her yoga class, where she felt a sudden hot flash and had a burning sensation in her forehead.  She went away for a week and trip in the mountains with some friends over the weekend and felt that the rash was spreading and worsening, from her forehead to her left cheek and also the top of her neck.  She had a telehealth visit and was prescribed Valtrex  which she has been taking now for 3 days, 1000 mg 3 times daily.  The rash continues to spread.  She was given a steroid injection by her PCP in the office and also had an ophthalmology follow-up appointment, where she was told that she may need to come to the hospital for IV antiviral medication.  She reports she has had her singles vaccines.  She denies prior history of shingles outbreaks.  She denies blurred vision or pain in her eye but does note there was some swelling and the rash appears to be around the outside of her eye.  HPI     Home Medications Prior to Admission medications   Medication Sig Start Date End Date Taking? Authorizing Provider  escitalopram  (LEXAPRO ) 10 MG tablet TAKE 1 & 1/2 (ONE & ONE-HALF) TABLETS BY MOUTH ONCE DAILY 07/30/23  Yes Gorsuch, Ni, MD  estradiol  (CLIMARA ) 0.025 mg/24hr patch Place 1 patch (0.025 mg total) onto the skin once a week. 02/06/23  Yes Suzi Essex, MD  ibuprofen  (ADVIL ) 200 MG tablet Take 200 mg by mouth every 6 (six) hours as needed for mild pain (pain score 1-3) or headache (IN CONJUNCTION WITH ONE 325 MG. TYLENOL ).   Yes [provider]  levothyroxine  (SYNTHROID , LEVOTHROID) 75 MCG tablet Take 75 mcg by mouth daily before  breakfast. 04/27/15  Yes [provider]  oxyCODONE -acetaminophen  (PERCOCET/ROXICET) 5-325 MG tablet Take 1 tablet by mouth every 6 (six) hours as needed for severe pain. Patient taking differently: Take 0.25-1 tablets by mouth every 6 (six) hours as needed for severe pain (pain score 7-10). 08/18/22  Yes Gorsuch, Ni, MD  Polyethyl Glycol-Propyl Glycol (SYSTANE) 0.4-0.3 % SOLN Place 1-2 drops into both eyes 3 (three) times daily as needed (dry/irritated eyes.).   Yes [provider]  prochlorperazine  (COMPAZINE ) 10 MG tablet Take 1 tablet (10 mg total) by mouth every 6 (six) hours as needed (Nausea or vomiting). 02/11/21  Yes Gorsuch, Ni, MD  RESTASIS 0.05 % ophthalmic emulsion Place 1 drop into both eyes 2 (two) times daily.   Yes [provider]  TYLENOL  325 MG tablet Take 325 mg by mouth every 6 (six) hours as needed for mild pain (pain score 1-3) or headache (IN CONJUNCTION WITH ONE 200 MG. IBUPROFEN ).   Yes [provider]  zolpidem  (AMBIEN ) 10 MG tablet TAKE 1 TABLET BY MOUTH AT BEDTIME AS NEEDED FOR SLEEP 08/01/23  Yes Gorsuch, Ni, MD  clobetasol ointment (TEMOVATE) 0.05 % Apply 1 Application topically See admin instructions. Apply a thin layer to the vulva every day as directed    [provider]  Cyanocobalamin  (VITAMIN B-12) 5000 MCG SUBL Place  5,000 mcg under the tongue daily.    [provider]  diazepam  (VALIUM ) 5 MG tablet Take 1 tablet (5 mg total) by mouth daily as needed for anxiety. Patient not taking: Reported on 07/21/2023 03/14/22   Almeda Jacobs, MD  Ginger-Calcium Carbonate (DRAMAMINE GINGER CHEWS PO) Take by mouth. Patient not taking: Reported on 10/30/2023    Almeda Jacobs, MD  valACYclovir  (VALTREX ) 1000 MG tablet Take 1 tablet (1,000 mg total) by mouth daily as needed (Fever Blister). Patient not taking: Reported on 07/21/2023 03/14/22   Almeda Jacobs, MD  valACYclovir  (VALTREX ) 1000 MG tablet Take 1 tablet (1,000 mg total) by mouth 3  (three) times daily for 7 days. 10/27/23 11/03/23  Mayer, Jodi R, NP      Allergies    Barium-containing compounds, Carboplatin , Hydrocodone, Erythromycin, and Zofran  [ondansetron ]    Review of Systems   Review of Systems  Physical Exam Updated Vital Signs BP 131/78 (BP Location: Right Arm)   Pulse (!) 57   Temp (!) 97.4 F (36.3 C) (Oral)   Resp 16   Ht 5' 5.5" (1.664 m)   Wt 74.8 kg   SpO2 94%   BMI 27.04 kg/m  Physical Exam Constitutional:      General: She is not in acute distress. HENT:     Head: Normocephalic and atraumatic.  Eyes:     Extraocular Movements: Extraocular movements intact.     Conjunctiva/sclera: Conjunctivae normal.     Pupils: Pupils are equal, round, and reactive to light.  Cardiovascular:     Rate and Rhythm: Normal rate and regular rhythm.  Pulmonary:     Effort: Pulmonary effort is normal. No respiratory distress.  Abdominal:     General: There is no distension.     Tenderness: There is no abdominal tenderness.  Skin:    General: Skin is warm and dry.     Comments: Erythematous vesicular rash on left forehead, left side of face, neck, bilateral chest, bilateral arms  Neurological:     General: No focal deficit present.     Mental Status: She is alert. Mental status is at baseline.  Psychiatric:        Mood and Affect: Mood normal.        Behavior: Behavior normal.     ED Results / Procedures / Treatments   Labs (all labs ordered are listed, but only abnormal results are displayed) Labs Reviewed  BASIC METABOLIC PANEL WITH GFR - Abnormal; Notable for the following components:      Result Value   CO2 19 (*)    Glucose, Bld 100 (*)    BUN 27 (*)    All other components within normal limits  CBC  BASIC METABOLIC PANEL WITH GFR  CBC    EKG None  Radiology No results found.  Procedures Procedures    Medications Ordered in ED Medications  acyclovir (ZOVIRAX) 750 mg in dextrose  5 % 250 mL IVPB (750 mg Intravenous New  Bag/Given 10/30/23 2119)  lactated ringers  infusion (has no administration in time range)  ibuprofen  (ADVIL ) tablet 200 mg (has no administration in time range)  oxyCODONE -acetaminophen  (PERCOCET/ROXICET) 5-325 MG per tablet 1 tablet (1 tablet Oral Given 10/30/23 2343)  acetaminophen  (TYLENOL ) tablet 325 mg (has no administration in time range)  escitalopram  (LEXAPRO ) tablet 15 mg (has no administration in time range)  zolpidem  (AMBIEN ) tablet 5 mg (5 mg Oral Given 10/30/23 2357)  levothyroxine  (SYNTHROID ) tablet 75 mcg (has no administration in time range)  clobetasol  ointment (TEMOVATE) 0.05 % 1 Application (has no administration in time range)  enoxaparin  (LOVENOX ) injection 40 mg (40 mg Subcutaneous Given 10/30/23 2117)  sodium chloride  flush (NS) 0.9 % injection 3 mL (has no administration in time range)  0.9 %  sodium chloride  infusion ( Intravenous New Bag/Given 10/30/23 2356)  bisacodyl (DULCOLAX) suppository 10 mg (has no administration in time range)  folic acid (FOLVITE) tablet 1 mg (1 mg Oral Given 10/30/23 2118)  albuterol (PROVENTIL) (2.5 MG/3ML) 0.083% nebulizer solution 2.5 mg (has no administration in time range)  estradiol  (CLIMARA  - Dosed in mg/24 hr) patch 0.025 mg (has no administration in time range)  polyvinyl alcohol (LIQUIFILM TEARS) 1.4 % ophthalmic solution 1 drop (has no administration in time range)    ED Course/ Medical Decision Making/ A&P                                 Medical Decision Making Amount and/or Complexity of Data Reviewed Labs: ordered.  Risk Decision regarding hospitalization.   Patient is presenting to the ED with concern for spreading rash that seems highly consistent with disseminated shingles.  The rash began before she had travel to the mountains where there was report of some bug bites, but I suspect this is likely a red herring.  I do not see evidence of cellulitis at this time.  I also do not see evidence of direct ocular involvement at this  time, although the patient is concerned about the proximity of her rash and outside of her eye.  She does not have a positive Hutchinson sign.  Given the involvement of multiple dermatomes this would be disseminated shingles.  She does not have evidence of encephalopathy or confusion.  But we will plan to check her labs admit the patient on IV acyclovir.  I will defer consideration of steroid medications to inpatient team as symptom onset was now 5 days ago.  Of note, the patient reports that she has advanced metastatic cancer and is no longer in treatment for this; she wants to focus on quality of life and minimize hospitalizations, but she is concerned about the pain and spread of this rash and its proximity to her eye.  I explained that having disseminated zoster is an indication for hospitalization with IV acyclovir, and that this is likely her best option to ensure that she does not have complications.  She is in agreement for hospitalization at this time.  I reviewed her labs, no emergent findings        Final Clinical Impression(s) / ED Diagnoses Final diagnoses:  Disseminated zoster    Rx / DC Orders ED Discharge Orders     None         Ashelynn Marks, Janalyn Me, MD 10/30/23 2359

## 2023-10-30 NOTE — Progress Notes (Signed)
 Pharmacy Antibiotic Note  Leslie Rivera is a 77 y.o. female admitted on 10/30/2023 with  shingles . Patient originally presented to the ED on 5/3 and was discharged with Valtrex  x7 days. Since then, patient reports shingles rash has spread to her face and both breasts. Pharmacy has been consulted for IV acyclovir dosing.  Plan: -Start acyclovir 10 mg/kg IV q8hrs -Start LR @125ml /hr, per MD -Monitor daily BMET x3 days, then at least q72hrs while on IV acyclovir  -Consider transitioning to PO acyclovir once formation of new lesions have ceased and s/sx of infection are improving   Temp (24hrs), Avg:97.9 F (36.6 C), Min:97.9 F (36.6 C), Max:97.9 F (36.6 C)  Recent Labs  Lab 10/30/23 1836  WBC 7.4  CREATININE 0.56    Estimated Creatinine Clearance: 61.2 mL/min (by C-G formula based on SCr of 0.56 mg/dL).    Allergies  Allergen Reactions   Barium-Containing Compounds Other (See Comments)    Legs and arm cramping & caused fall   Carboplatin  Other (See Comments)    Hypersensitivity- see note 11/16/22   Hydrocodone Nausea And Vomiting and Other (See Comments)    Can tolerate Oxycodone    Erythromycin Other (See Comments)    GI upset   Zofran  [Ondansetron ] Other (See Comments)    Headache-Never wants  again    Antimicrobials this admission: 5/6 acyclovir >>   Dose adjustments this admission: N/A  Microbiology results: None   Thank you for allowing pharmacy to be a part of this patient's care.  Roselee Cong, PharmD Clinical Pharmacist  5/6/20257:47 PM

## 2023-10-31 DIAGNOSIS — Z8543 Personal history of malignant neoplasm of ovary: Secondary | ICD-10-CM

## 2023-10-31 DIAGNOSIS — B027 Disseminated zoster: Secondary | ICD-10-CM | POA: Diagnosis not present

## 2023-10-31 DIAGNOSIS — C786 Secondary malignant neoplasm of retroperitoneum and peritoneum: Secondary | ICD-10-CM

## 2023-10-31 DIAGNOSIS — Z85038 Personal history of other malignant neoplasm of large intestine: Secondary | ICD-10-CM | POA: Diagnosis not present

## 2023-10-31 LAB — BASIC METABOLIC PANEL WITH GFR
Anion gap: 7 (ref 5–15)
BUN: 20 mg/dL (ref 8–23)
CO2: 21 mmol/L — ABNORMAL LOW (ref 22–32)
Calcium: 9 mg/dL (ref 8.9–10.3)
Chloride: 106 mmol/L (ref 98–111)
Creatinine, Ser: 0.62 mg/dL (ref 0.44–1.00)
GFR, Estimated: >60 mL/min (ref >60)
Glucose, Bld: 108 mg/dL — ABNORMAL HIGH (ref 70–99)
Potassium: 3.7 mmol/L (ref 3.5–5.1)
Sodium: 134 mmol/L — ABNORMAL LOW (ref 135–145)

## 2023-10-31 LAB — CBC
HCT: 40.3 % (ref 36.0–46.0)
Hemoglobin: 12.8 g/dL (ref 12.0–15.0)
MCH: 31.1 pg (ref 26.0–34.0)
MCHC: 31.8 g/dL (ref 30.0–36.0)
MCV: 97.8 fL (ref 80.0–100.0)
Platelets: 245 10*3/uL (ref 150–400)
RBC: 4.12 MIL/uL (ref 3.87–5.11)
RDW: 13.6 % (ref 11.5–15.5)
WBC: 6 10*3/uL (ref 4.0–10.5)
nRBC: 0 % (ref 0.0–0.2)

## 2023-10-31 MED ORDER — CALAMINE EX LOTN
1.0000 | TOPICAL_LOTION | Freq: Three times a day (TID) | CUTANEOUS | Status: DC | PRN
  Administered 2023-10-31: 1 via TOPICAL
  Filled 2023-10-31: qty 177

## 2023-10-31 NOTE — Consult Note (Signed)
 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 (hx both organ cancer), on palliative care no longer on chemo, admitted with disseminated zoster  She had primary zoster as a child Lesions showed entire cn5 left side and left chest / inner arms involvement No cns/eye involvement otherwise  Onset bout 5/1 or 5/2  She had had shingrix vaccine  Plan: Continue iv acyclovir Prn calamine lotion for itch/burning If in 1-2 days continued without eyes/cns involvement and stable distribution can discharge to finish 3 week course of tx with oral valacyclovir   Maintain airborne/contact precaution Discussed with primary team     ------------------------------------------------ Principal Problem:   Disseminated herpes zoster    HPI: Leslie Rivera is a 78 y.o. 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  She had chemotherapy but developed allergy to carboplatin  last year and decided against further chemotherapy.Patient decided she preferred a better quality of life with the rest of her time so she was referred to  home-based palliative care. Patient is DNR."  She said it feels itchy and burning No headache/confusion/eye pain  Left face swelling yesterday but better today   Feels well otherwise  No recent new chemical use/oral medication  Hx chicken pox as a child Had shingrix  Family History  Problem Relation Age of Onset   Cancer Brother        internal melanoma   Cancer Maternal Grandmother        colon   Ovarian cancer Neg Hx    Endometrial cancer Neg Hx    Pancreatic cancer Neg Hx    Prostate cancer Neg Hx     Social History   Tobacco Use   Smoking status: Former    Current packs/day: 0.00    Types: Cigarettes    Quit date: 06/11/1997    Years since quitting: 26.4   Smokeless tobacco: Never  Vaping Use   Vaping status: Never Used  Substance Use Topics   Alcohol use: Yes    Alcohol/week: 4.0 standard drinks of alcohol    Types: 2 Cans of beer, 2 Shots of liquor per week   Drug use: No    Allergies  Allergen Reactions   Barium-Containing Compounds Other (See Comments)    Legs and arm cramping & caused fall   Carboplatin  Other (See Comments)    Hypersensitivity- see note 11/16/22   Hydrocodone Nausea And Vomiting and Other (See Comments)    Can tolerate Oxycodone    Erythromycin Other (See Comments)    GI upset   Zofran  [Ondansetron ] Other (See Comments)    Headache-Never wants  again    Review of  Systems: ROS All Other ROS was negative, except mentioned above   Past Medical History:  Diagnosis Date   Abdominal pain    Adenomatous colon polyp    Anemia    Iron deficiency anemia   Anxiety    Cancer (HCC)    colon   Colon cancer (HCC) 07/17/2013   Family history of breast cancer    Family history of colon cancer    Family history of melanoma    History of migraine headaches    Hypothyroidism    Internal hemorrhoids    Irregular heart beat    occ. 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(HCC)        Scheduled Meds:   clobetasol ointment  1 Application Topical Daily   enoxaparin  (LOVENOX ) injection  40 mg Subcutaneous QHS   escitalopram   15 mg Oral Daily   estradiol   0.025 mg Transdermal Weekly   folic acid  1 mg Oral Daily   levothyroxine   75 mcg Oral QAC breakfast   sodium chloride  flush  3 mL Intravenous Q12H   Continuous Infusions:  sodium chloride  75 mL/hr at 10/30/23 2356   acyclovir 750 mg (10/31/23 0546)   PRN Meds:.acetaminophen , albuterol, bisacodyl, ibuprofen , oxyCODONE -acetaminophen , polyvinyl alcohol, zolpidem    OBJECTIVE: Blood pressure 108/76, pulse 61, temperature 97.7 F (36.5 C), temperature source Oral, resp. rate 18, height 5\' 5"  (1.651 m), weight 75.8 kg, SpO2 94%.  Physical Exam  General/constitutional: no distress, pleasant HEENT: Normocephalic, PER, Conj Clear, EOMI, Oropharynx clear Neck supple CV: rrr no mrg Lungs: clear to auscultation, normal respiratory effort Abd: Soft, Nontender Ext: no edema Skin: left cn5, inner t1-2 bilateral arm, and left chest rash erythematous with pustules/blisters on inside         Neuro: nonfocal MSK: no peripheral joint swelling/tenderness/warmth; back spines nontender     Lab Results Lab Results  Component Value Date   WBC 6.0 10/31/2023   HGB 12.8 10/31/2023   HCT 40.3 10/31/2023   MCV 97.8 10/31/2023   PLT 245 10/31/2023    Lab Results  Component Value Date   CREATININE 0.62 10/31/2023   BUN 20 10/31/2023   NA 134 (L) 10/31/2023   K 3.7 10/31/2023   CL 106 10/31/2023   CO2 21 (L) 10/31/2023    Lab Results  Component Value Date   ALT 57 (H) 07/23/2023   AST 88 (H) 07/23/2023   ALKPHOS 59 07/23/2023   BILITOT 0.4 07/23/2023      Microbiology: No results found for this or any previous visit (from the past 240 hours).   Serology:    Imaging: If present, new imagings (plain films, ct scans, and mri) have been personally visualized and interpreted; radiology reports have been reviewed. Decision  making incorporated into the Impression / Recommendations.    Jamesetta Mcbride, MD [facility] [phone] pager    10/31/2023, 10:39 AM

## 2023-10-31 NOTE — Progress Notes (Signed)
   10/31/23 1132  TOC Brief Assessment  Insurance and Status Reviewed  Patient has primary care physician Yes  Home environment has been reviewed resides in private residence  Prior level of function: Independent  Prior/Current Home Services No current home services  Social Drivers of Health Review SDOH reviewed no interventions necessary  Readmission risk has been reviewed Yes  Transition of care needs no transition of care needs at this time

## 2023-10-31 NOTE — Progress Notes (Signed)
 PROGRESS NOTE    Leslie Rivera  ZOX:[id number] DOB: Sep 22, 1945 DOA: 10/30/2023 PCP: Virgle Grime, MD   Brief Narrative:  This 78 y.o. 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  Patient decided to discontinue all treatment for the past 1 year.  Other comorbidities include hypothyroidism and an anxiety disorder.  Patient was sent from primary care physician's office for suspected disseminated herpes zoster infection. Patient has developed new onset of rash on the left side of face 5 days ago which was initially crusty.  She went to urgent care on 5/3 and was initiated on treatment for presumable herpes zoster infection.  Despite compliance with the acyclovir, she has spread of lesions now involving left side of chest and bilateral arms.  Patient was admitted for further evaluation.  Assessment & Plan:   Principal Problem:   Disseminated herpes zoster  Suspected disseminated herpes zoster infection: Patient presented with multiple erythematous lesions on the left side of face,  some lesions noted on the chest. It is involving multiple dermatomes.  Patient however has not have any tenderness of her lesions which makes it atypical.   Denies any history of contact dermatitis or atopic dermatitis.   Patient was initiated on IV acyclovir in the ER.   ID consulted, awaiting recommendation.   History of colon cancer and ovarian cancer with metastatic disease: Patient previously with total hysterectomy and bilateral salpingo-oophorectomy in 2022.   She received chemotherapy but opted to discontinue all therapy in 2024.   Patient is being followed up by palliative care team at home.   Hypothyroidism:  Asymptomatic.  Continue with Synthroid .   Anxiety disorder:  Continue Lexapro  at home.  As needed Valium  as needed.   Insomnia:  Continue  Ambien .   DVT prophylaxis:  Lovenox  Code Status: DNR Family  Communication: No family at bed side. Disposition Plan:    Status is: Inpatient Remains inpatient appropriate because: Admitted for presumed herpes zoster disseminated infection.    Consultants:  Infectious diseases  Procedures:  Antimicrobials:  Anti-infectives (From admission, onward)    Start     Dose/Rate Route Frequency Ordered Stop   10/30/23 2000  acyclovir (ZOVIRAX) 750 mg in dextrose  5 % 250 mL IVPB        10 mg/kg  74.8 kg 265 mL/hr over 60 Minutes Intravenous Every 8 hours 10/30/23 1951         Subjective: Patient was seen and examined at bedside.  Overnight events noted.   Patient reports the rash on the face and the chest is improving.   She reports feeling slightly better.   Objective: Vitals:   10/30/23 1829 10/30/23 2341 10/31/23 0105 10/31/23 0353  BP:  131/78  108/76  Pulse:  (!) 57  61  Resp:  16  18  Temp:  (!) 97.4 F (36.3 C)  97.7 F (36.5 C)  TempSrc:  Oral  Oral  SpO2:  94%  94%  Weight: 74.8 kg  75.8 kg   Height: 5' 5.5" (1.664 m)  5\' 5"  (1.651 m)     Intake/Output Summary (Last 24 hours) at 10/31/2023 1029 Last data filed at 10/31/2023 0546 Gross per 24 hour  Intake 437.47 ml  Output --  Net 437.47 ml   Filed Weights   10/30/23 1829 10/31/23 0105  Weight: 74.8 kg 75.8 kg    Examination:  General exam: Appears calm and comfortable, not in any acute distress. Respiratory system: CTA Bilaterally.  Respiratory  effort normal.  RR 15 Cardiovascular system: S1 & S2 heard, RRR. No JVD, murmurs, rubs, gallops or clicks. No pedal edema. Gastrointestinal system: Abdomen is non distended, soft and non tender.Normal bowel sounds heard. Central nervous system: Alert and oriented x 3. No focal neurological deficits. Extremities: No edema, no cyanosis, no clubbing. Skin: Erythematous rash with some crust noted on the left side of face.  Some lesions noted on chest. Psychiatry: Judgement and insight appear normal. Mood & affect appropriate.      Data Reviewed: I have personally reviewed following labs and imaging studies  CBC: Recent Labs  Lab 10/30/23 1836 10/31/23 0353  WBC 7.4 6.0  HGB 13.6 12.8  HCT 41.6 40.3  MCV 96.1 97.8  PLT 276 245   Basic Metabolic Panel: Recent Labs  Lab 10/30/23 1836 10/31/23 0353  NA 136 134*  K 3.9 3.7  CL 107 106  CO2 19* 21*  GLUCOSE 100* 108*  BUN 27* 20  CREATININE 0.56 0.62  CALCIUM 9.4 9.0   GFR: Estimated Creatinine Clearance: 60.9 mL/min (by C-G formula based on SCr of 0.62 mg/dL). Liver Function Tests: No results for input(s): "AST", "ALT", "ALKPHOS", "BILITOT", "PROT", "ALBUMIN" in the last 168 hours. No results for input(s): "LIPASE", "AMYLASE" in the last 168 hours. No results for input(s): "AMMONIA" in the last 168 hours. Coagulation Profile: No results for input(s): "INR", "PROTIME" in the last 168 hours. Cardiac Enzymes: No results for input(s): "CKTOTAL", "CKMB", "CKMBINDEX", "TROPONINI" in the last 168 hours. BNP (last 3 results) No results for input(s): "PROBNP" in the last 8760 hours. HbA1C: No results for input(s): "HGBA1C" in the last 72 hours. CBG: No results for input(s): "GLUCAP" in the last 168 hours. Lipid Profile: No results for input(s): "CHOL", "HDL", "LDLCALC", "TRIG", "CHOLHDL", "LDLDIRECT" in the last 72 hours. Thyroid  Function Tests: No results for input(s): "TSH", "T4TOTAL", "FREET4", "T3FREE", "THYROIDAB" in the last 72 hours. Anemia Panel: No results for input(s): "VITAMINB12", "FOLATE", "FERRITIN", "TIBC", "IRON", "RETICCTPCT" in the last 72 hours. Sepsis Labs: No results for input(s): "PROCALCITON", "LATICACIDVEN" in the last 168 hours.  No results found for this or any previous visit (from the past 240 hours).   Radiology Studies: No results found.  Scheduled Meds:  clobetasol ointment  1 Application Topical Daily   enoxaparin  (LOVENOX ) injection  40 mg Subcutaneous QHS   escitalopram   15 mg Oral Daily   estradiol   0.025  mg Transdermal Weekly   folic acid  1 mg Oral Daily   levothyroxine   75 mcg Oral QAC breakfast   sodium chloride  flush  3 mL Intravenous Q12H   Continuous Infusions:  sodium chloride  75 mL/hr at 10/30/23 2356   acyclovir 750 mg (10/31/23 0546)     LOS: 1 day    Time spent: 50 mins    Magdalene School, MD [facility]   If 7PM-7AM, please contact night-coverage

## 2023-10-31 NOTE — Plan of Care (Signed)
  Problem: Health Behavior/Discharge Planning: Goal: Ability to manage health-related needs will improve Outcome: Progressing   Problem: Activity: Goal: Risk for activity intolerance will decrease Outcome: Progressing   Problem: Nutrition: Goal: Adequate nutrition will be maintained Outcome: Progressing   Problem: Elimination: Goal: Will not experience complications related to bowel motility Outcome: Progressing   Problem: Pain Managment: Goal: General experience of comfort will improve and/or be controlled Outcome: Progressing

## 2023-11-01 DIAGNOSIS — C786 Secondary malignant neoplasm of retroperitoneum and peritoneum: Secondary | ICD-10-CM | POA: Diagnosis not present

## 2023-11-01 DIAGNOSIS — Z85038 Personal history of other malignant neoplasm of large intestine: Secondary | ICD-10-CM | POA: Diagnosis not present

## 2023-11-01 DIAGNOSIS — Z8543 Personal history of malignant neoplasm of ovary: Secondary | ICD-10-CM | POA: Diagnosis not present

## 2023-11-01 DIAGNOSIS — B027 Disseminated zoster: Secondary | ICD-10-CM | POA: Diagnosis not present

## 2023-11-01 LAB — BASIC METABOLIC PANEL WITH GFR
Anion gap: 9 (ref 5–15)
BUN: 15 mg/dL (ref 8–23)
CO2: 21 mmol/L — ABNORMAL LOW (ref 22–32)
Calcium: 9.4 mg/dL (ref 8.9–10.3)
Chloride: 110 mmol/L (ref 98–111)
Creatinine, Ser: 0.73 mg/dL (ref 0.44–1.00)
GFR, Estimated: >60 mL/min (ref >60)
Glucose, Bld: 100 mg/dL — ABNORMAL HIGH (ref 70–99)
Potassium: 4.4 mmol/L (ref 3.5–5.1)
Sodium: 140 mmol/L (ref 135–145)

## 2023-11-01 MED ORDER — CLOBETASOL PROPIONATE 0.05 % EX OINT: 1.0000 | TOPICAL_OINTMENT | Freq: Every day | CUTANEOUS | 0 refills | Status: AC

## 2023-11-01 MED ORDER — VALACYCLOVIR HCL 1 G PO TABS: 1000.0000 mg | ORAL_TABLET | Freq: Three times a day (TID) | ORAL | 0 refills | Status: AC

## 2023-11-01 NOTE — Progress Notes (Signed)
 PROGRESS NOTE    Leslie Rivera  WUJ:[id number] DOB: [birth year] DOA: 10/30/2023 PCP: Virgle Grime, MD   Brief Narrative:  This 78 y.o. 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  Patient decided to discontinue all treatment for the past 1 year.  Other comorbidities include hypothyroidism and an anxiety disorder.  Patient was sent from primary care physician's office for suspected disseminated herpes zoster infection. Patient has developed new onset of rash on the left side of face 5 days ago which was initially crusty.  She went to urgent care on 5/3 and was initiated on treatment for presumable herpes zoster infection.  Despite compliance with the acyclovir, she has spread of lesions now involving left side of chest and bilateral arms.  Patient was admitted for further evaluation.  Assessment & Plan:   Principal Problem:   Disseminated herpes zoster  Suspected disseminated herpes zoster infection: Patient presented with multiple erythematous lesions on the left side of face,  some lesions noted on the chest.  There is no eye or CNS involvement. It is involving multiple dermatomes.  Patient however has not have any tenderness of her lesions which makes it atypical.   Denies any history of contact dermatitis or atopic dermatitis.   Patient was initiated on IV acyclovir in the ER.   ID consulted, Continue IV acyclovir. As needed calamine lotion for itching and burning. If in 1 to 2 days continued without eyes or CNS involvement and stable distribution can be discharged to finish 3-week course of oral valacyclovir .    History of colon cancer and ovarian cancer with metastatic disease: Patient previously with total hysterectomy and bilateral salpingo-oophorectomy in 2022.   She received chemotherapy but opted to discontinue all therapy in 2024.   Patient is being followed up by palliative care team at home.    Hypothyroidism:  Asymptomatic.  Continue with Synthroid .   Anxiety disorder:  Continue Lexapro  at home.  As needed Valium  as needed.   Insomnia:  Continue  Ambien .   DVT prophylaxis:  Lovenox  Code Status: DNR Family Communication: No family at bed side. Disposition Plan:    Status is: Inpatient Remains inpatient appropriate because: Admitted for presumed herpes zoster disseminated infection.    Consultants:  Infectious diseases  Procedures:  Antimicrobials:  Anti-infectives (From admission, onward)    Start     Dose/Rate Route Frequency Ordered Stop   10/30/23 2000  acyclovir (ZOVIRAX) 750 mg in dextrose  5 % 250 mL IVPB        10 mg/kg  74.8 kg 265 mL/hr over 60 Minutes Intravenous Every 8 hours 10/30/23 1951         Subjective: Patient was seen and examined at bedside.  Overnight events noted.   Patient reports rash on the face is better but has developed new lesions on the chest. She asks if she can be discharged if no further concerns.   Objective: Vitals:   10/31/23 0353 10/31/23 1401 10/31/23 2045 11/01/23 0650  BP: 108/76 111/66 114/75 115/66  Pulse: 61 (!) 56 67 (!) 54  Resp: 18 16 16 18   Temp: 97.7 F (36.5 C) 98.7 F (37.1 C) 98.2 F (36.8 C) 97.8 F (36.6 C)  TempSrc: Oral Oral Oral Oral  SpO2: 94% 94% 95% 94%  Weight:      Height:        Intake/Output Summary (Last 24 hours) at 11/01/2023 1057 Last data filed at 11/01/2023 0947 Gross per 24 hour  Intake 1951.83 ml  Output --  Net 1951.83 ml   Filed Weights   10/30/23 1829 10/31/23 0105  Weight: 74.8 kg 75.8 kg    Examination:  General exam: Appears calm and comfortable, not in any acute distress. Respiratory system: CTA Bilaterally.  Respiratory effort normal.  RR 14 Cardiovascular system: S1 & S2 heard, RRR. No JVD, murmurs, rubs, gallops or clicks. No pedal edema. Gastrointestinal system: Abdomen is non distended, soft and non tender.Normal bowel sounds heard. Central nervous system:  Alert and oriented x 3. No focal neurological deficits. Extremities: No edema, no cyanosis, no clubbing. Skin: Erythematous rash with some crust noted on the left side of face.  Some lesions noted on chest. Psychiatry: Judgement and insight appear normal. Mood & affect appropriate.     Data Reviewed: I have personally reviewed following labs and imaging studies  CBC: Recent Labs  Lab 10/30/23 1836 10/31/23 0353  WBC 7.4 6.0  HGB 13.6 12.8  HCT 41.6 40.3  MCV 96.1 97.8  PLT 276 245   Basic Metabolic Panel: Recent Labs  Lab 10/30/23 1836 10/31/23 0353 11/01/23 0344  NA 136 134* 140  K 3.9 3.7 4.4  CL 107 106 110  CO2 19* 21* 21*  GLUCOSE 100* 108* 100*  BUN 27* 20 15  CREATININE 0.56 0.62 0.73  CALCIUM 9.4 9.0 9.4   GFR: Estimated Creatinine Clearance: 60.9 mL/min (by C-G formula based on SCr of 0.73 mg/dL). Liver Function Tests: No results for input(s): "AST", "ALT", "ALKPHOS", "BILITOT", "PROT", "ALBUMIN" in the last 168 hours. No results for input(s): "LIPASE", "AMYLASE" in the last 168 hours. No results for input(s): "AMMONIA" in the last 168 hours. Coagulation Profile: No results for input(s): "INR", "PROTIME" in the last 168 hours. Cardiac Enzymes: No results for input(s): "CKTOTAL", "CKMB", "CKMBINDEX", "TROPONINI" in the last 168 hours. BNP (last 3 results) No results for input(s): "PROBNP" in the last 8760 hours. HbA1C: No results for input(s): "HGBA1C" in the last 72 hours. CBG: No results for input(s): "GLUCAP" in the last 168 hours. Lipid Profile: No results for input(s): "CHOL", "HDL", "LDLCALC", "TRIG", "CHOLHDL", "LDLDIRECT" in the last 72 hours. Thyroid  Function Tests: No results for input(s): "TSH", "T4TOTAL", "FREET4", "T3FREE", "THYROIDAB" in the last 72 hours. Anemia Panel: No results for input(s): "VITAMINB12", "FOLATE", "FERRITIN", "TIBC", "IRON", "RETICCTPCT" in the last 72 hours. Sepsis Labs: No results for input(s): "PROCALCITON",  "LATICACIDVEN" in the last 168 hours.  No results found for this or any previous visit (from the past 240 hours).   Radiology Studies: No results found.  Scheduled Meds:  clobetasol ointment  1 Application Topical Daily   enoxaparin  (LOVENOX ) injection  40 mg Subcutaneous QHS   escitalopram   15 mg Oral Daily   estradiol   0.025 mg Transdermal Weekly   folic acid  1 mg Oral Daily   levothyroxine   75 mcg Oral QAC breakfast   sodium chloride  flush  3 mL Intravenous Q12H   Continuous Infusions:  sodium chloride  75 mL/hr at 11/01/23 0555   acyclovir 750 mg (11/01/23 0557)     LOS: 2 days    Time spent: 35 mins    Magdalene School, MD [facility]   If 7PM-7AM, please contact night-coverage

## 2023-11-01 NOTE — Plan of Care (Signed)
   Problem: Education: Goal: Knowledge of General Education information will improve Description: Including pain rating scale, medication(s)/side effects and non-pharmacologic comfort measures Outcome: Progressing   Problem: Activity: Goal: Risk for activity intolerance will decrease Outcome: Progressing   Problem: Pain Managment: Goal: General experience of comfort will improve and/or be controlled Outcome: Progressing

## 2023-11-01 NOTE — Progress Notes (Signed)
 [facility]  Date of Admission:  10/30/2023       Abx: acyclovir  ASSESSMENT: Disseminated zoster  Bilateral t1 (arm/chest) and left entire cn5) Stable No encephalitis or cornea involvement   PLAN: Ok to discharge for 3 weeks valacyclovir  1 gram tid F/u ophthalmology 1-2 weeks as discussed with them outpatient previously No need to f/u id Maintain contact precaution at home until all lesions crust over [facility] to discharge from id standpoint Discussed with primary team   Principal Problem:   Disseminated herpes zoster   Allergies  Allergen Reactions   Barium-Containing Compounds Other (See Comments)    Legs and arm cramping & caused fall   Carboplatin  Other (See Comments)    Hypersensitivity- see note 11/16/22   Hydrocodone Nausea And Vomiting and Other (See Comments)    Can tolerate Oxycodone    Erythromycin Other (See Comments)    GI upset   Zofran  [Ondansetron ] Other (See Comments)    Headache-Never wants  again    Scheduled Meds:  clobetasol ointment  1 Application Topical Daily   enoxaparin  (LOVENOX ) injection  40 mg Subcutaneous QHS   escitalopram   15 mg Oral Daily   estradiol   0.025 mg Transdermal Weekly   folic acid  1 mg Oral Daily   levothyroxine   75 mcg Oral QAC breakfast   sodium chloride  flush  3 mL Intravenous Q12H   Continuous Infusions:  sodium chloride  75 mL/hr at 11/01/23 0555   acyclovir 750 mg (11/01/23 1354)   PRN Meds:.acetaminophen , albuterol, bisacodyl, calamine, ibuprofen , oxyCODONE -acetaminophen , polyvinyl alcohol, zolpidem    SUBJECTIVE: Feels well. No new lesion outside of right chest which on exam is in t1 distribution which she had on the inner arm  No headache, confusion No f/c No eye pain  Review of Systems: ROS All other ROS was negative, except mentioned above     OBJECTIVE: Vitals:   10/31/23 1401 10/31/23 2045 11/01/23 0650 11/01/23 1323  BP: 111/66 114/75 115/66 133/78   Pulse: (!) 56 67 (!) 54 (!) 53  Resp: 16 16 18 16   Temp: 98.7 F (37.1 C) 98.2 F (36.8 C) 97.8 F (36.6 C) 98.1 F (36.7 C)  TempSrc: Oral Oral Oral Oral  SpO2: 94% 95% 94% 100%  Weight:      Height:       Body mass index is 27.81 kg/m.  Physical Exam General/constitutional: no distress, pleasant HEENT: Normocephalic, PER, Conj Clear, EOMI, Oropharynx clear Neck supple CV: rrr no mrg Lungs: clear to auscultation, normal respiratory effort Abd: Soft, Nontender Ext: no edema Skin: face lesions more crusted; right t1 and t2 arms and chest lesions with some pustules/blister still Neuro: nonfocal   Lab Results Lab Results  Component Value Date   WBC 6.0 10/31/2023   HGB 12.8 10/31/2023   HCT 40.3 10/31/2023   MCV 97.8 10/31/2023   PLT 245 10/31/2023    Lab Results  Component Value Date   CREATININE 0.73 11/01/2023   BUN 15 11/01/2023   NA 140 11/01/2023   K 4.4 11/01/2023   CL 110 11/01/2023   CO2 21 (L) 11/01/2023    Lab Results  Component Value Date   ALT 57 (H) 07/23/2023   AST 88 (H) 07/23/2023   ALKPHOS 59 07/23/2023   BILITOT 0.4 07/23/2023      Microbiology: No results found for this or any previous visit (from the past 240 hours).   Serology:   Imaging: If present, new  imagings (plain films, ct scans, and mri) have been personally visualized and interpreted; radiology reports have been reviewed. Decision making incorporated into the Impression / Recommendations.   Jamesetta Mcbride, MD [facility] [phone] pager    11/01/2023, 3:50 PM

## 2023-11-01 NOTE — Discharge Instructions (Signed)
 Advised to take Valtrex  1 g 3 times daily for 21 days. Advised to follow-up with ophthalmology.

## 2023-11-01 NOTE — Discharge Summary (Signed)
 Physician Discharge Summary  Leslie Rivera:[id number] DOB: 1946/05/30 DOA: 10/30/2023  PCP: Virgle Grime, MD  Admit date: 10/30/2023  Discharge date: 11/01/2023  Admitted From: Home.  Disposition:  Home.  Recommendations for Outpatient Follow-up:  Follow up with PCP in 1-2 weeks. Please obtain BMP/CBC in one week. Advised to take Valtrex  1 g 3 times daily for 21 days. Advised to follow-up with ophthalmology.  Home Health:None Equipment/Devices:None  Discharge Condition: Stable CODE STATUS:Full code Diet recommendation: Heart Healthy   Brief [facility] Course: This 78 y.o. 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  Patient decided to discontinue all treatment for the past 1 year.  Other comorbidities include hypothyroidism and  anxiety disorder.  Patient was sent from primary care physician's office for suspected disseminated herpes zoster infection. Patient has developed new onset of rash on the left side of face 5 days ago which was initially crusty.  She went to urgent care on 5/3 and was initiated on treatment for presumable herpes zoster infection.  Despite compliance with the acyclovir, she has spread of lesions now involving left side of chest and bilateral arms.  Patient was admitted for further evaluation.  Patient was evaluated by infectious diseases. She has no eye and CNS involvement. Lesions were improving and crusting. Infectious disease states patient feels better and wants to be discharged.  Patient can be discharged on oral Valtrex  1 g 3 times daily for 3 weeks.  Advised to follow-up with ophthalmology.  Patient is being discharged home.   Discharge Diagnoses:  Principal Problem:   Disseminated herpes zoster  Suspected disseminated herpes zoster infection: Patient presented with multiple erythematous lesions on the left side of face,  some lesions noted on the chest.  There  is no eye or CNS involvement. It is involving multiple dermatomes.  Patient however has not have any tenderness of her lesions which makes it atypical.   Denies any history of contact dermatitis or atopic dermatitis.   Patient was initiated on IV acyclovir in the ER.   ID consulted, Continue IV acyclovir. As needed calamine lotion for itching and burning. She has no eye and CNS involvement.  There is a stable distribution of the rash. ID recommended patient can be discharged on 3-week course of oral valacyclovir  1 g 3 times daily. She has been discharged home.   History of colon cancer and ovarian cancer with metastatic disease: Patient previously with total hysterectomy and bilateral salpingo-oophorectomy in 2022.   She received chemotherapy but opted to discontinue all therapy in 2024.   Patient is being followed up by palliative care team at home.   Hypothyroidism:  Asymptomatic.  Continue with Synthroid .   Anxiety disorder:  Continue Lexapro  at home.  As needed Valium  as needed.   Insomnia:  Continue  Ambien .  Discharge Instructions  Discharge Instructions     Call MD for:  difficulty breathing, headache or visual disturbances   Complete by: As directed    Call MD for:  persistant dizziness or light-headedness   Complete by: As directed    Call MD for:  persistant nausea and vomiting   Complete by: As directed    Diet - low sodium heart healthy   Complete by: As directed    Diet general   Complete by: As directed    Discharge instructions   Complete by: As directed    Advised to follow-up with primary care physician in 1 week. Advised to take  Valtrex  1 g 3 times daily for 21 days. Advised to follow-up with ophthalmology.   Increase activity slowly   Complete by: As directed       Allergies as of 11/01/2023       Reactions   Barium-containing Compounds Other (See Comments)   Legs and arm cramping & caused fall   Carboplatin  Other (See Comments)   Hypersensitivity-  see note 11/16/22   Hydrocodone Nausea And Vomiting, Other (See Comments)   Can tolerate Oxycodone    Erythromycin Other (See Comments)   GI upset   Zofran  [ondansetron ] Other (See Comments)   Headache-Never wants  again        Medication List     STOP taking these medications    diazepam  5 MG tablet Commonly known as: VALIUM    DRAMAMINE GINGER CHEWS PO       TAKE these medications    clobetasol ointment 0.05 % Commonly known as: TEMOVATE Apply 1 Application topically See admin instructions. Apply a thin layer to the vulva every day as directed What changed: Another medication with the same name was added. Make sure you understand how and when to take each.   clobetasol ointment 0.05 % Commonly known as: TEMOVATE Apply 1 Application topically daily. Start taking on: Nov 02, 2023 What changed: You were already taking a medication with the same name, and this prescription was added. Make sure you understand how and when to take each.   escitalopram  10 MG tablet Commonly known as: LEXAPRO  TAKE 1 & 1/2 (ONE & ONE-HALF) TABLETS BY MOUTH ONCE DAILY   estradiol  0.025 mg/24hr patch Commonly known as: Cl[place]  Place 1 patch (0.025 mg total) onto the skin once a week.   ibuprofen  200 MG tablet Commonly known as: ADVIL  Take 200 mg by mouth every 6 (six) hours as needed for mild pain (pain score 1-3) or headache (IN CONJUNCTION WITH ONE 325 MG. TYLENOL ).   levothyroxine  75 MCG tablet Commonly known as: SYNTHROID  Take 75 mcg by mouth daily before breakfast.   oxyCODONE -acetaminophen  5-325 MG tablet Commonly known as: PERCOCET/ROXICET Take 1 tablet by mouth every 6 (six) hours as needed for severe pain. What changed: how much to take   prochlorperazine  10 MG tablet Commonly known as: COMPAZINE  Take 1 tablet (10 mg total) by mouth every 6 (six) hours as needed (Nausea or vomiting).   Restasis 0.05 % ophthalmic emulsion Generic drug: cycloSPORINE Place 1 drop into both  eyes 2 (two) times daily.   Systane 0.4-0.3 % Soln Generic drug: Polyethyl Glycol-Propyl Glycol Place 1-2 drops into both eyes 3 (three) times daily as needed (dry/irritated eyes.).   Tylenol  325 MG tablet Generic drug: acetaminophen  Take 325 mg by mouth every 6 (six) hours as needed for mild pain (pain score 1-3) or headache (IN CONJUNCTION WITH ONE 200 MG. IBUPROFEN ).   valACYclovir  1000 MG tablet Commonly known as: Valtrex  Take 1 tablet (1,000 mg total) by mouth 3 (three) times daily for 21 days. What changed: Another medication with the same name was removed. Continue taking this medication, and follow the directions you see here.   Vitamin B-12 5000 MCG Subl Place 5,000 mcg under the tongue daily.   zolpidem  10 MG tablet Commonly known as: AMBIEN  TAKE 1 TABLET BY MOUTH AT BEDTIME AS NEEDED FOR SLEEP        Follow-up Information     Virgle Grime, MD Follow up in 1 week(s).   Specialty: Internal Medicine Contact information: [address] [place]  [address] [phone]                Allergies  Allergen Reactions   Barium-Containing Compounds Other (See Comments)    Legs and arm cramping & caused fall   Carboplatin  Other (See Comments)    Hypersensitivity- see note 11/16/22   Hydrocodone Nausea And Vomiting and Other (See Comments)    Can tolerate Oxycodone    Erythromycin Other (See Comments)    GI upset   Zofran  [Ondansetron ] Other (See Comments)    Headache-Never wants  again    Consultations: Infectious Diseases.   Procedures/Studies: No results found.    Subjective: Patient was seen and examined at bedside.  Overnight events noted.   Patient reports doing much better and wants to be discharged.  Discharge Exam: Vitals:   11/01/23 0650 11/01/23 1323  BP: 115/66 133/78  Pulse: (!) 54 (!) 53  Resp: 18 16  Temp: 97.8 F (36.6 C) 98.1 F (36.7 C)  SpO2: 94% 100%   Vitals:   10/31/23 1401 10/31/23 2045  11/01/23 0650 11/01/23 1323  BP: 111/66 114/75 115/66 133/78  Pulse: (!) 56 67 (!) 54 (!) 53  Resp: 16 16 18 16   Temp: 98.7 F (37.1 C) 98.2 F (36.8 C) 97.8 F (36.6 C) 98.1 F (36.7 C)  TempSrc: Oral Oral Oral Oral  SpO2: 94% 95% 94% 100%  Weight:      Height:        General: Pt is alert, awake, not in acute distress Cardiovascular: RRR, S1/S2 +, no rubs, no gallops Respiratory: CTA bilaterally, no wheezing, no rhonchi Abdominal: Soft, NT, ND, bowel sounds + Extremities: no edema, no cyanosis    The results of significant diagnostics from this hospitalization (including imaging, microbiology, ancillary and laboratory) are listed below for reference.     Microbiology: No results found for this or any previous visit (from the past 240 hours).   Labs: BNP (last 3 results) No results for input(s): "BNP" in the last 8760 hours. Basic Metabolic Panel: Recent Labs  Lab 10/30/23 1836 10/31/23 0353 11/01/23 0344  NA 136 134* 140  K 3.9 3.7 4.4  CL 107 106 110  CO2 19* 21* 21*  GLUCOSE 100* 108* 100*  BUN 27* 20 15  CREATININE 0.56 0.62 0.73  CALCIUM 9.4 9.0 9.4   Liver Function Tests: No results for input(s): "AST", "ALT", "ALKPHOS", "BILITOT", "PROT", "ALBUMIN" in the last 168 hours. No results for input(s): "LIPASE", "AMYLASE" in the last 168 hours. No results for input(s): "AMMONIA" in the last 168 hours. CBC: Recent Labs  Lab 10/30/23 1836 10/31/23 0353  WBC 7.4 6.0  HGB 13.6 12.8  HCT 41.6 40.3  MCV 96.1 97.8  PLT 276 245   Cardiac Enzymes: No results for input(s): "CKTOTAL", "CKMB", "CKMBINDEX", "TROPONINI" in the last 168 hours. BNP: Invalid input(s): "POCBNP" CBG: No results for input(s): "GLUCAP" in the last 168 hours. D-Dimer No results for input(s): "DDIMER" in the last 72 hours. Hgb A1c No results for input(s): "HGBA1C" in the last 72 hours. Lipid Profile No results for input(s): "CHOL", "HDL", "LDLCALC", "TRIG", "CHOLHDL", "LDLDIRECT" in  the last 72 hours. Thyroid  function studies No results for input(s): "TSH", "T4TOTAL", "T3FREE", "THYROIDAB" in the last 72 hours.  Invalid input(s): "FREET3" Anemia work up No results for input(s): "VITAMINB12", "FOLATE", "FERRITIN", "TIBC", "IRON", "RETICCTPCT" in the last 72 hours. Urinalysis    Component Value Date/Time   COLORURINE YELLOW 07/22/2023 0758   APPEARANCEUR CLOUDY (A) 07/22/2023 0758   LABSPEC  1.013 07/22/2023 0758   PHURINE 5.0 07/22/2023 0758   GLUCOSEU NEGATIVE 07/22/2023 0758   HGBUR MODERATE (A) 07/22/2023 0758   BILIRUBINUR NEGATIVE 07/22/2023 0758   KETONESUR NEGATIVE 07/22/2023 0758   PROTEINUR NEGATIVE 07/22/2023 0758   NITRITE NEGATIVE 07/22/2023 0758   LEUKOCYTESUR TRACE (A) 07/22/2023 0758   Sepsis Labs Recent Labs  Lab 10/30/23 1836 10/31/23 0353  WBC 7.4 6.0   Microbiology No results found for this or any previous visit (from the past 240 hours).   Time coordinating discharge: Over 30 minutes  SIGNED:   Magdalene School, MD  [facility] 11/01/2023, 3:08 PM Pager   If 7PM-7AM, please contact night-coverage

## 2023-11-02 ENCOUNTER — Other Ambulatory Visit: Payer: Self-pay | Admitting: Family Medicine

## 2023-11-02 ENCOUNTER — Telehealth: Payer: Self-pay

## 2023-11-02 ENCOUNTER — Other Ambulatory Visit (HOSPITAL_COMMUNITY): Payer: Self-pay

## 2023-11-02 ENCOUNTER — Other Ambulatory Visit: Payer: Self-pay | Admitting: Hematology and Oncology

## 2023-11-02 DIAGNOSIS — B027 Disseminated zoster: Secondary | ICD-10-CM

## 2023-11-02 MED ORDER — OXYCODONE-ACETAMINOPHEN 5-325 MG PO TABS: 1.0000 | ORAL_TABLET | Freq: Four times a day (QID) | ORAL | 0 refills | Status: DC | PRN

## 2023-11-02 MED ORDER — OXYCODONE-ACETAMINOPHEN 5-325 MG PO TABS: 1.0000 | ORAL_TABLET | Freq: Four times a day (QID) | ORAL | 0 refills | Status: AC | PRN

## 2023-11-02 NOTE — Telephone Encounter (Signed)
 Attempted to call Leslie Rivera, no answer. Called sister and given below message. Percocet Rx sent to [facility]. Leslie Rivera verbalized understanding. Per sister, the hospital sent percocet Rx per sister but [facility] lost the Rx. Leslie Rivera is still just palliative care only not in hospice. [facility] but due to being palliative only they could not help with pain medication. Offered appt next week with Dr. Marton Sleeper, Leslie Rivera will talk with Leslie Rivera and call the office back about appt.  FYI

## 2023-11-02 NOTE — Transitions of Care (Post Inpatient/ED Visit) (Signed)
 11/02/2023  Patient ID: Leslie Rivera, female   DOB: 1946-02-27, 78 y.o.   MRN: [id number]  The patient called RNCM today because she went to her pharmacy to pick up her medications for her Shingles and the Oxycodone -Acetaminophen  was not at the pharmacy. The Hospitalist provider at [facility] did not send a prescription for the pain medication. CM called the patient's Hospice and Palliative Care provider Tammy and left her a message explaining the situation and asked for a prescription to be sent to [facility] on [address]. CM also called the Oncologist and left a message explaining the situation asked for that provider to call in a prescription. Contacted [facility] as that is where the prescription was last filled a year ago and the medication is not there. Contacted the patient to update her on all the above information and advised her to call her Palliative Care Nurse as well.   Gareld June, BSN, RN [place]  [facility]BCI - [employer] [facility] RN Care Manager [phone]

## 2023-11-02 NOTE — Transitions of Care (Post Inpatient/ED Visit) (Signed)
   11/02/2023  Name: Leslie Rivera MRN: [id number] DOB: 09-May-1946  Today's TOC FU Call Status: Today's TOC FU Call Status:: Successful TOC FU Call Completed TOC FU Call Complete Date: 11/02/23 Patient's Name and Date of Birth confirmed.  Transition Care Management Follow-up Telephone Call Date of Discharge: 11/01/23 Discharge Facility: [facility]) Type of Discharge: Inpatient Admission Primary Inpatient Discharge Diagnosis:: Shingles How have you been since you were released from the hospital?: Better Any questions or concerns?: No  Items Reviewed: Did you receive and understand the discharge instructions provided?: Yes Medications obtained,verified, and reconciled?: Yes (Medications Reviewed) Any new allergies since your discharge?: No Dietary orders reviewed?: NA Do you have support at home?: No  Medications Reviewed Today: Medications Reviewed Today   Medications were not reviewed in this encounter     Home Care and Equipment/Supplies: Were Home Health Services Ordered?: NA Any new equipment or medical supplies ordered?: NA  Functional Questionnaire: Do you need assistance with bathing/showering or dressing?: No Do you need assistance with meal preparation?: No Do you need assistance with eating?: No Do you have difficulty maintaining continence: No Do you need assistance with getting out of bed/getting out of a chair/moving?: No Do you have difficulty managing or taking your medications?: No  Follow up appointments reviewed: PCP Follow-up appointment confirmed?: NA Specialist Hospital Follow-up appointment confirmed?: NA Do you need transportation to your follow-up appointment?: No Do you understand care options if your condition(s) worsen?: Yes-patient verbalized understanding  See Documentation in [facility]. The patient is no longer attributed to [facility]  Gareld June, BSN, RN [place]  [facility] - [facility] RN Care Manager [phone]

## 2023-11-02 NOTE — Telephone Encounter (Signed)
 This is very unreasonable request since I have not seen her for almost a year I will send a small prescription but that needs to be monitored, and she will likely need to be on it for several weeks Can you ask patient if she is still established with palliative care? If yes, they need to take over her care, including all medications refills If not, I can see her back next week

## 2023-11-02 NOTE — Telephone Encounter (Signed)
 Case manager, Larinda Plover with [facility] called and left a message. Leslie Rivera was recently in the hospital with shingles and called the case manager asking for pain medication this afternoon. Requesting pain medication for shingles pain to [facility] pharmacy. She has had percocet in the past.

## 2023-11-07 NOTE — Assessment & Plan Note (Signed)
 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, treated with single agent carboplatin  with excellent response to therapy followed by complete hysterectomy and omentectomy in November 2022 and completion of chemotherapy by February 2023 Pathology: High-grade serous carcinoma, HRD positive She progressed on maintenance olaparib  with signs of cancer recurrence with metastatic lymphadenopathy in February 2024, treated with carboplatin  again but developed allergic reaction to treatment in May 2024 By June or 2024, she made informed decision to discontinue treatment and was enrolled in palliative care Over the past 11 months, she had recurrent hospitalization due to infection, the latest was disseminated herpes zoster  Despite all this, she is still working She is currently enrolled in palliative care but not hospice I will see her in the future as needed

## 2023-11-08 ENCOUNTER — Encounter: Payer: Self-pay | Admitting: Hematology and Oncology

## 2023-11-08 ENCOUNTER — Inpatient Hospital Stay: Attending: Hematology and Oncology | Admitting: Hematology and Oncology

## 2023-11-08 VITALS — BP 113/86 | HR 90 | Temp 97.4°F | Resp 18 | Ht 65.0 in | Wt 162.2 lb

## 2023-11-08 DIAGNOSIS — C5701 Malignant neoplasm of right fallopian tube: Secondary | ICD-10-CM | POA: Diagnosis not present

## 2023-11-08 DIAGNOSIS — Z85038 Personal history of other malignant neoplasm of large intestine: Secondary | ICD-10-CM | POA: Insufficient documentation

## 2023-11-08 DIAGNOSIS — B027 Disseminated zoster: Secondary | ICD-10-CM

## 2023-11-08 DIAGNOSIS — Z9221 Personal history of antineoplastic chemotherapy: Secondary | ICD-10-CM | POA: Diagnosis not present

## 2023-11-08 MED ORDER — OXYCODONE-ACETAMINOPHEN 5-325 MG PO TABS: 1.0000 | ORAL_TABLET | ORAL | 0 refills | Status: AC | PRN

## 2023-11-08 NOTE — Progress Notes (Signed)
 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, treated with single agent carboplatin  with excellent response to therapy followed by complete hysterectomy and omentectomy in November 2022 and completion of chemotherapy by February 2023 Pathology: High-grade serous carcinoma, HRD positive She progressed on maintenance olaparib  with signs of cancer recurrence with metastatic lymphadenopathy in February 2024, treated with carboplatin  again but developed allergic reaction to treatment in May 2024 By June or 2024, she made informed decision to discontinue treatment and was enrolled in palliative care Over the past 11 months, she had recurrent hospitalization due to infection, the latest was disseminated herpes zoster  Despite all this, she is still working She is currently enrolled in palliative care but not hospice I will see her in the future as needed Disseminated herpes zoster Overall, her skin is healing She will continue Valtrex  as prescribed I refilled her prescription of pain medicine We discussed narcotic refill policy and side effects to be expected  No orders of the defined types were placed in this encounter.    Leslie Jacobs, MD  INTERVAL HISTORY: she returns for surveillance follow-up after recent hospital discharge She is doing a lot of pain but with the rare some prescribed pain medicine, she is doing well She is still working several days a week She still have severe pain but they are tolerable.  She denies any new open sores on her skin since discharge from the hospital Her appetite is  stable She denies recent nausea or constipation  PHYSICAL EXAMINATION: ECOG PERFORMANCE STATUS: 1 - Symptomatic but completely ambulatory  Vitals:   11/08/23 1044  BP: 113/86  Pulse: 90  Resp: 18  Temp: (!) 97.4 F (36.3 C)  SpO2: 98%   Filed Weights   11/08/23 1044  Weight: 162 lb 3.2 oz (73.6 kg)   Diffuse rash all over her body is noted consistent with recent herpes zoster Relevant data reviewed during this visit included CBC and CMP from recent hospitalization

## 2023-11-08 NOTE — Assessment & Plan Note (Addendum)
 Overall, her skin is healing She will continue Valtrex  as prescribed I refilled her prescription of pain medicine We discussed narcotic refill policy and side effects to be expected

## 2023-11-22 DIAGNOSIS — Z8544 Personal history of malignant neoplasm of other female genital organs: Secondary | ICD-10-CM | POA: Diagnosis not present

## 2023-11-22 DIAGNOSIS — Z90710 Acquired absence of both cervix and uterus: Secondary | ICD-10-CM | POA: Diagnosis not present

## 2023-11-22 DIAGNOSIS — Z90722 Acquired absence of ovaries, bilateral: Secondary | ICD-10-CM | POA: Diagnosis not present

## 2023-11-22 DIAGNOSIS — C778 Secondary and unspecified malignant neoplasm of lymph nodes of multiple regions: Secondary | ICD-10-CM | POA: Diagnosis not present

## 2023-11-30 ENCOUNTER — Telehealth: Payer: Self-pay | Admitting: *Deleted

## 2023-11-30 ENCOUNTER — Other Ambulatory Visit: Payer: Self-pay | Admitting: Gynecologic Oncology

## 2023-11-30 DIAGNOSIS — R232 Flushing: Secondary | ICD-10-CM

## 2023-11-30 MED ORDER — ESTRADIOL 0.0375 MG/24HR TD PTWK: 0.0375 mg | MEDICATED_PATCH | TRANSDERMAL | 12 refills | Status: AC

## 2023-11-30 NOTE — Telephone Encounter (Signed)
 Spoke with Leslie Rivera and returned patient's call. Leslie Rivera has been using the estradiol  (climara ) patch daily since August of 2024 and had been getting good relief from her "hot flashes" pt states that her estrogen patch is no longer providing enough for symptom relief and her "hot flashes" are breaking through worse and non-stop. Pt is calling today asking if Dr. Orvil Bland will increase the dose of her patch.  Advised patient that her message will be relayed to provider and the office would call back with recommendations. Pt thanked the office for calling.

## 2023-11-30 NOTE — Telephone Encounter (Signed)
 Spoke with Ms. Berland and relayed message that Dr. Orvil Bland sent in a new prescription with next higher dose. Pt said, "thank you so much, you are all so kind to me"

## 2023-11-30 NOTE — Telephone Encounter (Signed)
 New prescription sent with next higher dose. Thank you

## 2024-01-28 DIAGNOSIS — K080 Exfoliation of teeth due to systemic causes: Secondary | ICD-10-CM | POA: Diagnosis not present

## 2024-02-03 ENCOUNTER — Other Ambulatory Visit: Payer: Self-pay | Admitting: Hematology and Oncology

## 2024-03-04 DIAGNOSIS — L309 Dermatitis, unspecified: Secondary | ICD-10-CM | POA: Diagnosis not present

## 2024-03-04 DIAGNOSIS — L820 Inflamed seborrheic keratosis: Secondary | ICD-10-CM | POA: Diagnosis not present

## 2024-03-06 ENCOUNTER — Telehealth: Payer: Self-pay

## 2024-03-06 ENCOUNTER — Other Ambulatory Visit: Payer: Self-pay

## 2024-03-06 MED ORDER — COVID-19 MRNA VAC-TRIS(PFIZER) 30 MCG/0.3ML IM SUSY: 0.3000 mL | PREFILLED_SYRINGE | Freq: Once | INTRAMUSCULAR | 0 refills | Status: AC

## 2024-03-06 NOTE — Telephone Encounter (Signed)
 LVM notifying patient that a prescription had been sent to her [facility] pharmacy for her to receive a Covid vaccine booster.

## 2024-03-19 ENCOUNTER — Encounter: Payer: Self-pay | Admitting: Hematology and Oncology

## 2024-03-20 ENCOUNTER — Telehealth: Payer: Self-pay | Admitting: Oncology

## 2024-03-20 NOTE — Telephone Encounter (Signed)
 Called Leslie Rivera regarding her MyChart message and advised Dr. Lonn is ok with her switching to [facility] for palliative/hospice care.  Leslie Rivera asked that we call in the referral to [facility].  Called [facility] and spoke with Leslie Rivera.  She will contact Leslie Rivera about palliative care and eventually hospice.  She advised that Leslie Rivera will need to call [facility] and let them know she is transferring her care.  Left a message for Leslie Rivera that the referral has been called in to [facility] and that she will need to call [facility] and let them know she is transferring her care to [facility].

## 2024-03-31 ENCOUNTER — Other Ambulatory Visit: Payer: Self-pay | Admitting: Hematology and Oncology

## 2024-04-20 ENCOUNTER — Encounter: Payer: Self-pay | Admitting: Hematology and Oncology

## 2024-04-21 ENCOUNTER — Telehealth: Payer: Self-pay

## 2024-04-21 NOTE — Telephone Encounter (Signed)
 Called and scheduled appt on 11/7. She is aware of appt.

## 2024-04-23 DIAGNOSIS — H10413 Chronic giant papillary conjunctivitis, bilateral: Secondary | ICD-10-CM | POA: Diagnosis not present

## 2024-04-25 ENCOUNTER — Other Ambulatory Visit: Payer: Self-pay

## 2024-04-25 ENCOUNTER — Telehealth: Payer: Self-pay

## 2024-04-25 MED ORDER — BUPROPION HCL ER (XL) 150 MG PO TB24: 150.0000 mg | ORAL_TABLET | Freq: Every day | ORAL | 0 refills | Status: DC

## 2024-04-25 NOTE — Telephone Encounter (Signed)
 Called and sent Rx to her preferred pharmacy.

## 2024-04-25 NOTE — Telephone Encounter (Signed)
-----   Message from Almarie Bedford sent at 04/25/2024  9:24 AM EDT ----- Pls send Wellbutrin 150 mg daily to her pharmacy of choice for 30 tabs no refills

## 2024-04-28 DIAGNOSIS — H10413 Chronic giant papillary conjunctivitis, bilateral: Secondary | ICD-10-CM | POA: Diagnosis not present

## 2024-04-30 ENCOUNTER — Other Ambulatory Visit: Payer: Self-pay | Admitting: Hematology and Oncology

## 2024-05-02 ENCOUNTER — Inpatient Hospital Stay: Attending: Hematology and Oncology | Admitting: Hematology and Oncology

## 2024-05-02 ENCOUNTER — Encounter: Payer: Self-pay | Admitting: Hematology and Oncology

## 2024-05-02 VITALS — BP 121/84 | HR 71 | Temp 97.5°F | Resp 17 | Wt 168.0 lb

## 2024-05-02 DIAGNOSIS — C5701 Malignant neoplasm of right fallopian tube: Secondary | ICD-10-CM | POA: Diagnosis not present

## 2024-05-02 DIAGNOSIS — Z90710 Acquired absence of both cervix and uterus: Secondary | ICD-10-CM | POA: Insufficient documentation

## 2024-05-02 DIAGNOSIS — F419 Anxiety disorder, unspecified: Secondary | ICD-10-CM | POA: Diagnosis not present

## 2024-05-02 DIAGNOSIS — Z9221 Personal history of antineoplastic chemotherapy: Secondary | ICD-10-CM | POA: Diagnosis not present

## 2024-05-02 NOTE — Progress Notes (Signed)
 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, treated with single agent carboplatin  with excellent response to therapy followed by complete hysterectomy and omentectomy in November 2022 and completion of chemotherapy by February 2023 Pathology: High-grade serous carcinoma, HRD positive She progressed on maintenance olaparib  with signs of cancer recurrence with metastatic lymphadenopathy in February 2024, treated with carboplatin  again but developed allergic reaction to treatment in May 2024 By June or 2024, she made informed decision to discontinue treatment and was enrolled in palliative care Over the past year, she had recurrent hospitalization due to infection, the latest was disseminated herpes zoster of which she has fully recovered She is currently enrolled in palliative care but not hospice I will see her in the future as needed Chronic anxiety She has been on escitalopram  for years She would like to try Wellbutrin and we will prescribe it approximately a week ago So far she tolerated that well I reviewed her medication list I recommend the patient to try current dose of Wellbutrin for minimum of 6 to 8 weeks before consideration to increase the dose if she has no additional benefit  No orders of the defined types were placed in this encounter.    Almarie Bedford, MD  INTERVAL HISTORY: she returns for surveillance follow-up  She is here accompanied by her sister She is still working She is not symptomatic from recurrent cancer in her abdomen She had recent anxiety and would like to try addition of  Wellbutrin to her medication regimen So far, she is doing well No suicidal ideation  PHYSICAL EXAMINATION: ECOG PERFORMANCE STATUS: 0 - Asymptomatic  Vitals:   05/02/24 0931  BP: 121/84  Pulse: 71  Resp: 17  Temp: (!) 97.5 F (36.4 C)  SpO2: 98%   Filed Weights   05/02/24 0931  Weight: 168 lb (76.2 kg)

## 2024-05-02 NOTE — Assessment & Plan Note (Addendum)
 She has been on escitalopram  for years She would like to try Wellbutrin and we will prescribe it approximately a week ago So far she tolerated that well I reviewed her medication list I recommend the patient to try current dose of Wellbutrin for minimum of 6 to 8 weeks before consideration to increase the dose if she has no additional benefit

## 2024-05-02 NOTE — Assessment & Plan Note (Addendum)
 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, treated with single agent carboplatin  with excellent response to therapy followed by complete hysterectomy and omentectomy in November 2022 and completion of chemotherapy by February 2023 Pathology: High-grade serous carcinoma, HRD positive She progressed on maintenance olaparib  with signs of cancer recurrence with metastatic lymphadenopathy in February 2024, treated with carboplatin  again but developed allergic reaction to treatment in May 2024 By June or 2024, she made informed decision to discontinue treatment and was enrolled in palliative care Over the past year, she had recurrent hospitalization due to infection, the latest was disseminated herpes zoster of which she has fully recovered She is currently enrolled in palliative care but not hospice I will see her in the future as needed

## 2024-05-19 ENCOUNTER — Other Ambulatory Visit: Payer: Self-pay | Admitting: Hematology and Oncology

## 2024-05-20 DIAGNOSIS — Z8544 Personal history of malignant neoplasm of other female genital organs: Secondary | ICD-10-CM | POA: Diagnosis not present

## 2024-05-20 DIAGNOSIS — Z90722 Acquired absence of ovaries, bilateral: Secondary | ICD-10-CM | POA: Diagnosis not present

## 2024-05-20 DIAGNOSIS — Z90710 Acquired absence of both cervix and uterus: Secondary | ICD-10-CM | POA: Diagnosis not present

## 2024-05-20 DIAGNOSIS — C778 Secondary and unspecified malignant neoplasm of lymph nodes of multiple regions: Secondary | ICD-10-CM | POA: Diagnosis not present

## 2024-05-29 ENCOUNTER — Other Ambulatory Visit: Payer: Self-pay | Admitting: Hematology and Oncology

## 2024-06-12 DIAGNOSIS — H1820 Unspecified corneal edema: Secondary | ICD-10-CM | POA: Diagnosis not present

## 2024-06-12 DIAGNOSIS — H16223 Keratoconjunctivitis sicca, not specified as Sjogren's, bilateral: Secondary | ICD-10-CM | POA: Diagnosis not present

## 2024-06-12 DIAGNOSIS — H02055 Trichiasis without entropian left lower eyelid: Secondary | ICD-10-CM | POA: Diagnosis not present
